# Patient Record
Sex: Male | Born: 1955 | Race: White | Hispanic: No | Marital: Married | Smoking: Former smoker
Health system: Southern US, Community
[De-identification: ages and names within clinical notes are randomized; demographics above are authoritative.]

## PROBLEM LIST (undated history)

## (undated) DIAGNOSIS — M329 Systemic lupus erythematosus, unspecified: Secondary | ICD-10-CM

## (undated) DIAGNOSIS — K219 Gastro-esophageal reflux disease without esophagitis: Secondary | ICD-10-CM

## (undated) DIAGNOSIS — I82409 Acute embolism and thrombosis of unspecified deep veins of unspecified lower extremity: Secondary | ICD-10-CM

## (undated) DIAGNOSIS — M1990 Unspecified osteoarthritis, unspecified site: Secondary | ICD-10-CM

## (undated) DIAGNOSIS — C4490 Unspecified malignant neoplasm of skin, unspecified: Secondary | ICD-10-CM

## (undated) DIAGNOSIS — R569 Unspecified convulsions: Secondary | ICD-10-CM

## (undated) DIAGNOSIS — E785 Hyperlipidemia, unspecified: Secondary | ICD-10-CM

## (undated) DIAGNOSIS — I10 Essential (primary) hypertension: Secondary | ICD-10-CM

## (undated) DIAGNOSIS — D689 Coagulation defect, unspecified: Secondary | ICD-10-CM

## (undated) HISTORY — DX: Unspecified osteoarthritis, unspecified site: M19.90

## (undated) HISTORY — DX: Unspecified malignant neoplasm of skin, unspecified: C44.90

## (undated) HISTORY — PX: EYE SURGERY: SHX253

## (undated) HISTORY — DX: Coagulation defect, unspecified: D68.9

## (undated) HISTORY — DX: Acute embolism and thrombosis of unspecified deep veins of unspecified lower extremity: I82.409

## (undated) HISTORY — PX: NO PAST SURGERIES: SHX2092

## (undated) HISTORY — DX: Essential (primary) hypertension: I10

## (undated) HISTORY — DX: Hyperlipidemia, unspecified: E78.5

## (undated) HISTORY — DX: Gastro-esophageal reflux disease without esophagitis: K21.9

## (undated) HISTORY — DX: Systemic lupus erythematosus, unspecified: M32.9

## (undated) HISTORY — DX: Unspecified convulsions: R56.9

---

## 2009-01-28 ENCOUNTER — Emergency Department: Payer: Self-pay | Admitting: Emergency Medicine

## 2009-05-16 ENCOUNTER — Ambulatory Visit: Payer: Self-pay | Admitting: Ophthalmology

## 2009-06-03 ENCOUNTER — Ambulatory Visit: Payer: Self-pay | Admitting: Ophthalmology

## 2010-02-26 LAB — HM COLONOSCOPY

## 2010-09-25 ENCOUNTER — Ambulatory Visit: Payer: Self-pay | Admitting: Nephrology

## 2011-01-30 IMAGING — CR DG CHEST 1V PORT
1 series · 1 of 1 positions shown · non-contrast
Comparison: none

REASON FOR EXAM: Chest Pain
COMMENTS:

[view not recorded]
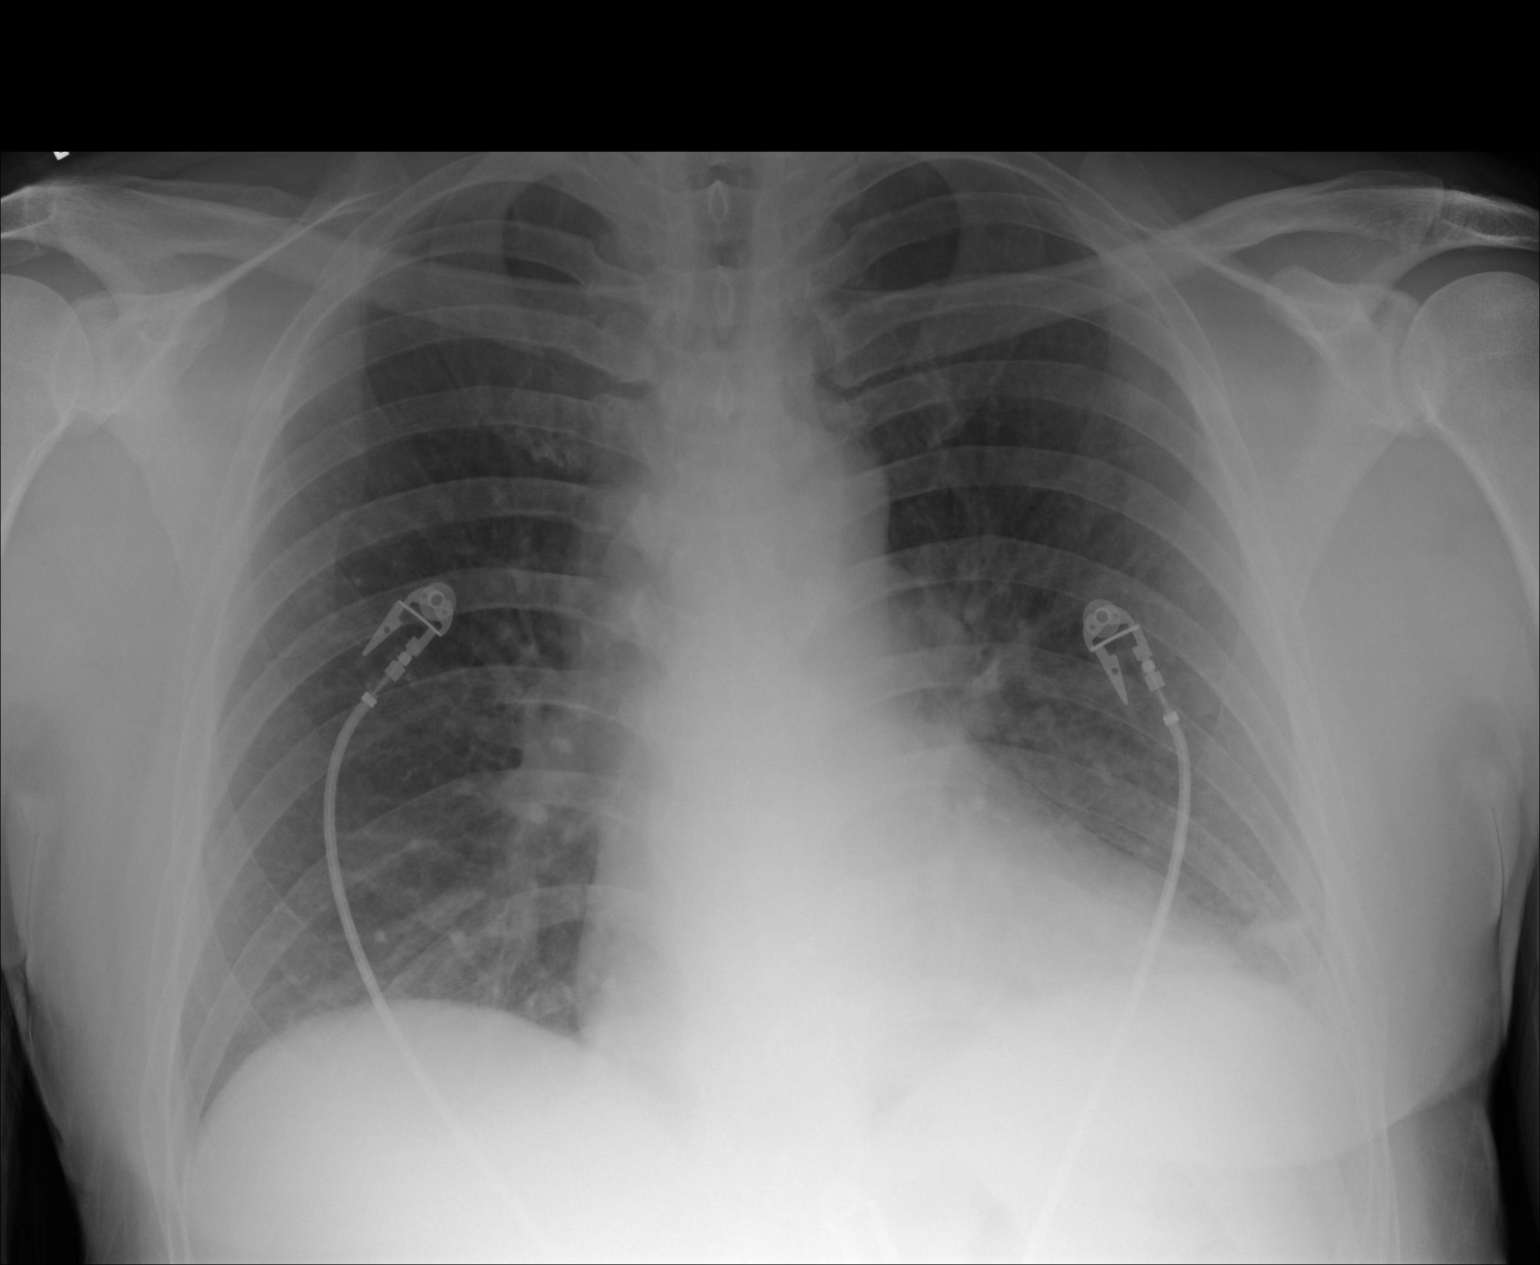

[1 of 1 positions shown; findings below may reference images not displayed]

PROCEDURE:     DXR - DXR PORTABLE CHEST SINGLE VIEW  - January 29, 2009 [DATE]

RESULT:     There is increased density at the left lung base which may
represent atelectasis or infiltrate. The heart is at the upper limits of
normal in size. There is no edema or pneumothorax. There is blunting of the
left costophrenic angle suggestive of effusion.
IMPRESSION: Probable left lung base pneumonia. Follow-up PA and lateral
views are recommended.

## 2011-01-30 IMAGING — CT CT CHEST W/ CM
1 series · 15 of 33 positions shown, 19 images · IV contrast (APPLIED)
Comparison: none

REASON FOR EXAM: syncope, shortness of breath, hx of DVT (remote), Eval
for PE
COMMENTS:

PROCEDURE:     CT  - CT CHEST (FOR PE) W  - January 29, 2009  [DATE]
RESULT:
TECHNIQUE: Helical 3 mm sections were obtained from the thoracic inlet
through the lung bases status post intravenous administration of 75 ml
[id number].

[Series 4: soft tissue · axial · 0.68mm/px · z∈[-738,-447]mm · 15 of 115 slices shown, 19 images]
[im 9/115  mediastinal]
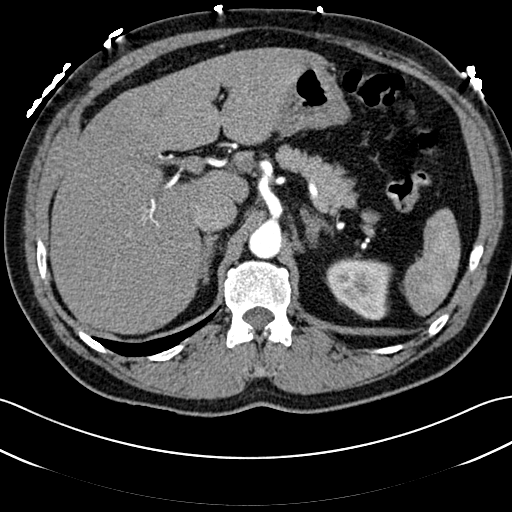
[im 9/115  lung]
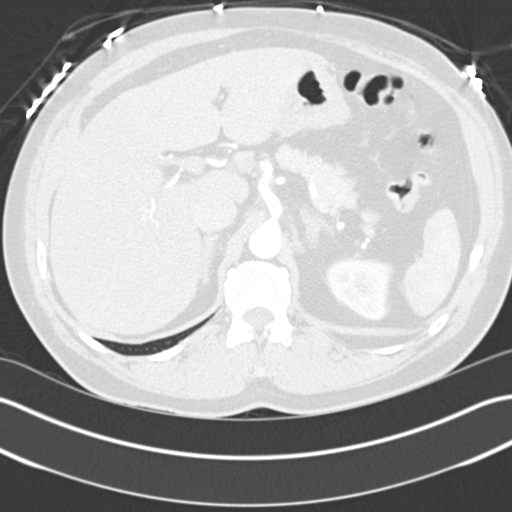
[im 17/115  lung]
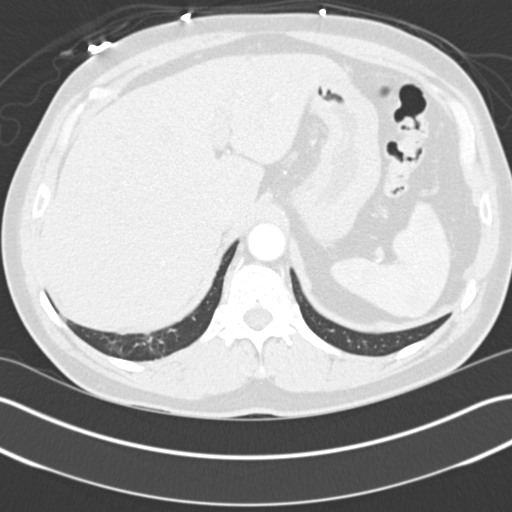
[im 23/115  lung]
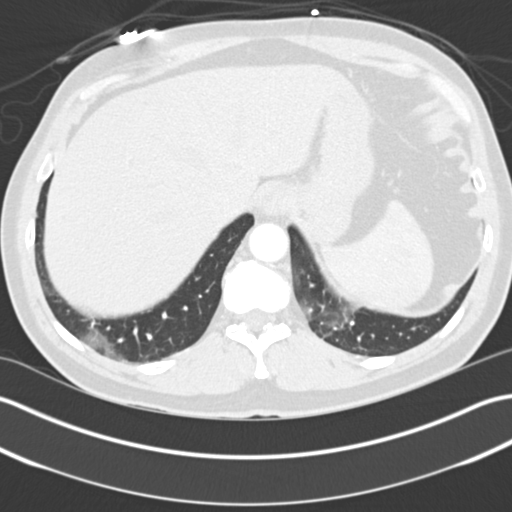
[im 30/115  lung]
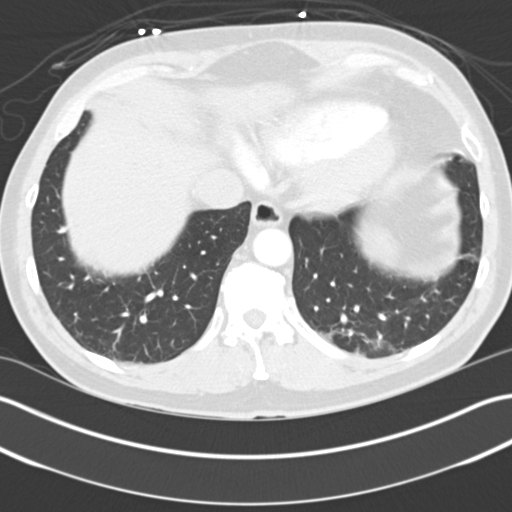
[im 39/115  mediastinal]
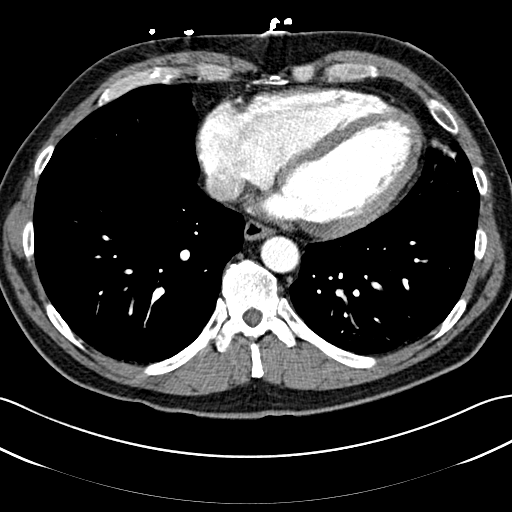
[im 39/115  lung]
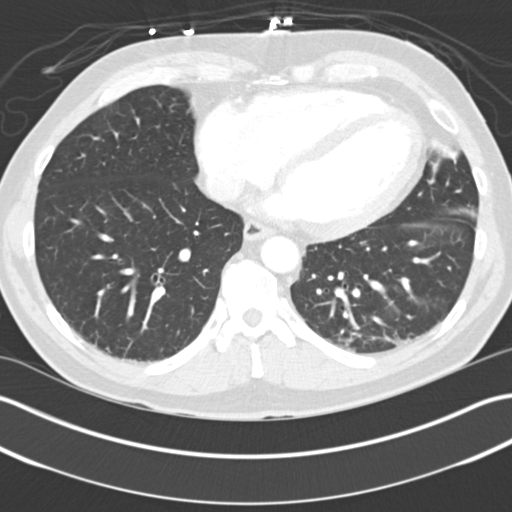
[im 46/115  lung]
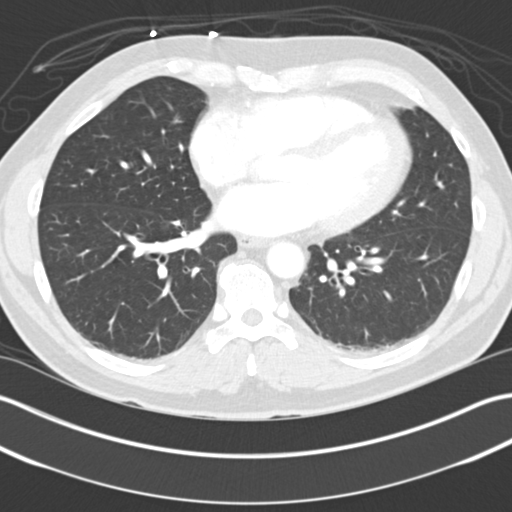
[im 51/115  lung]
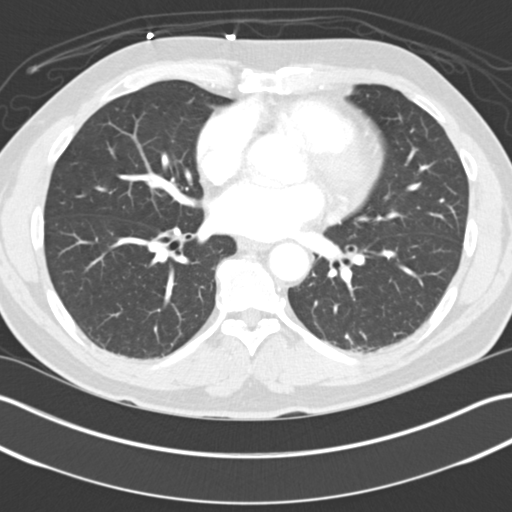
[im 60/115  lung]
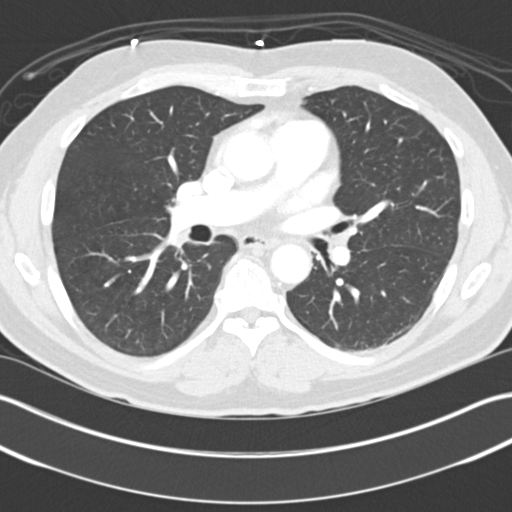
[im 64/115  mediastinal]
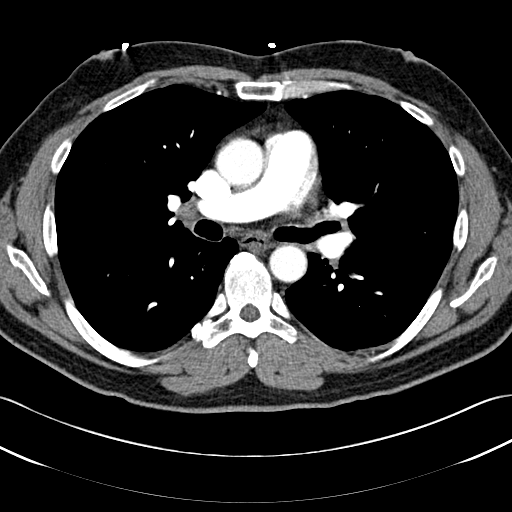
[im 64/115  lung]
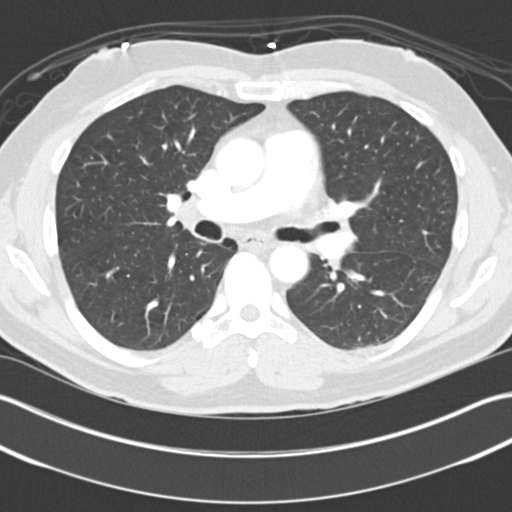
[im 69/115  lung]
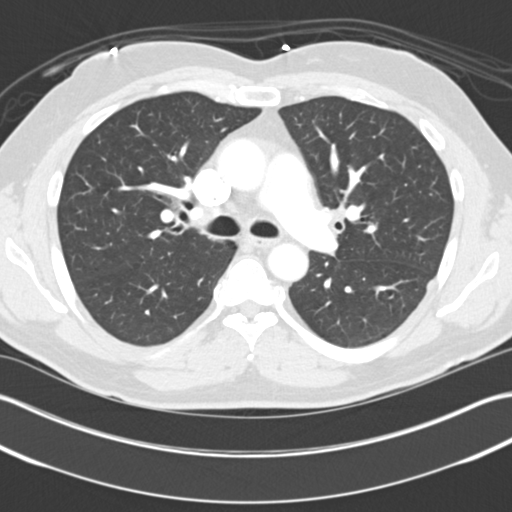
[im 77/115  lung]
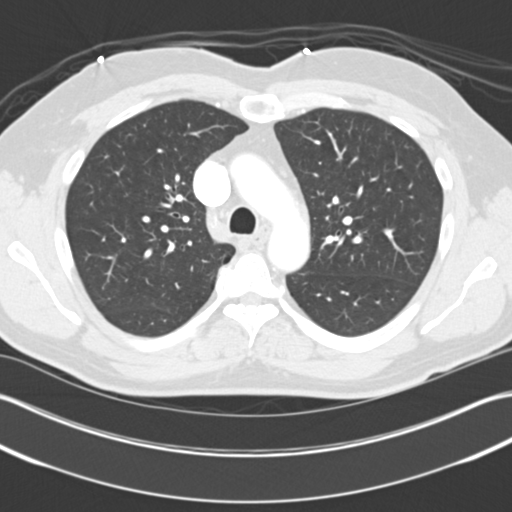
[im 85/115  lung]
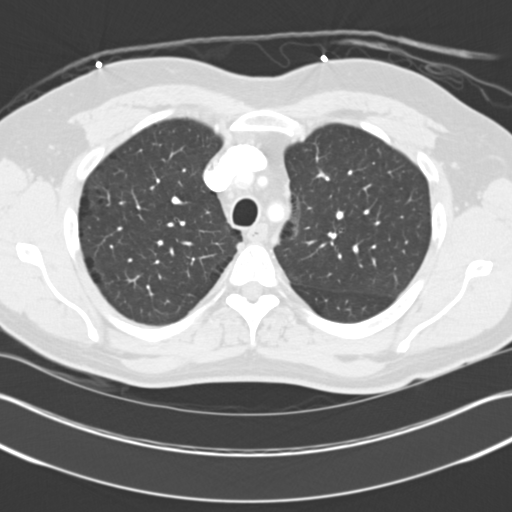
[im 92/115  mediastinal]
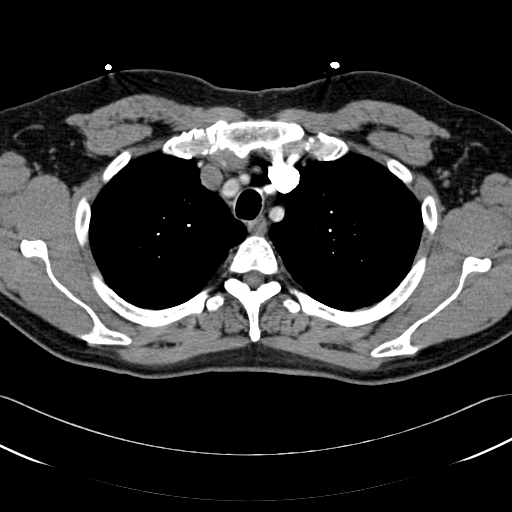
[im 92/115  lung]
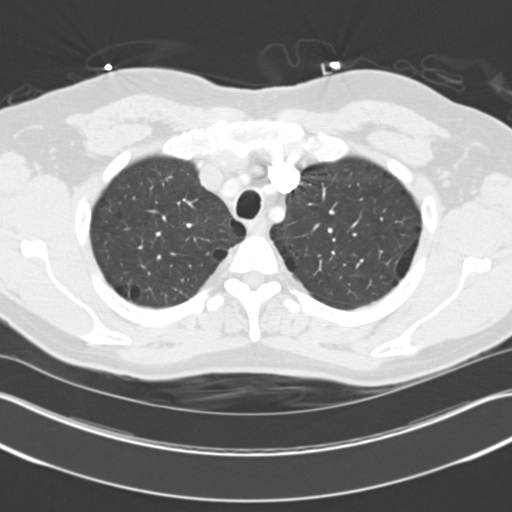
[im 98/115  lung]
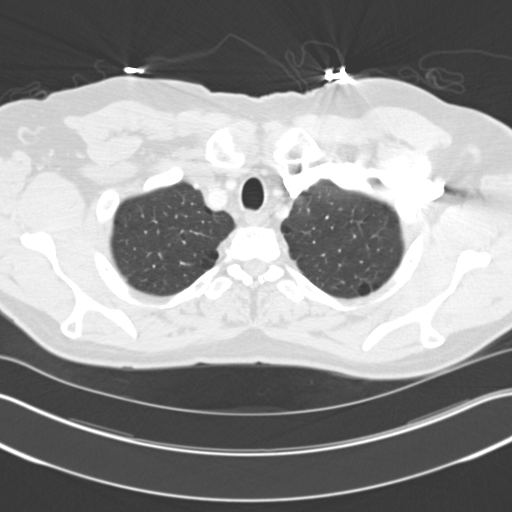
[im 106/115  lung]
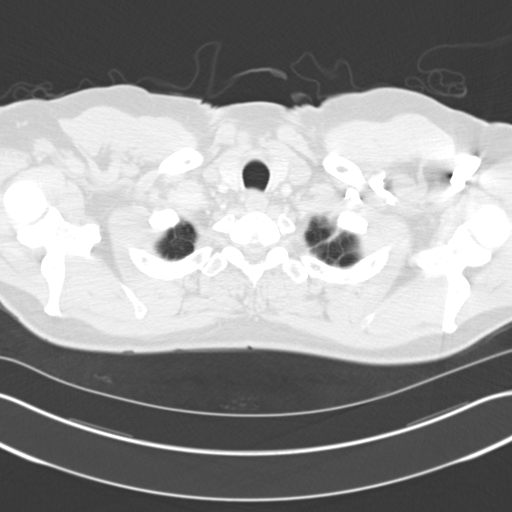

[15 of 33 positions shown; findings below may reference images not displayed]

FINDINGS: Evaluation of the mediastinum and hilar regions and structures
demonstrates no evidence of mediastinal or hilar adenopathy nor masses.
There is no evidence of filling defects within the main lobar or segmental
pulmonary arteries. The lung parenchyma demonstrates no evidence of focal
infiltrates, effusions, edema, masses or nodules. Hypoventilation is
identified within the lung bases as well as minimal emphysematous changes
within the apices. There is no CT evidence of a thoracic aortic aneurysm nor
dissection. The visualized upper abdominal viscera are grossly unremarkable.
IMPRESSION: 1. No CT evidence of focal or acute intrathoracic abnormalities.
2. There is no CT evidence of pulmonary arterial embolic disease, aortic
dissection or aneurysm.

Dr. Alexis Antonio of the Emergency Department was informed of these findings via
a preliminary faxed report on 01/29/2009 at [DATE] a.m. Central Time.

## 2012-07-09 ENCOUNTER — Emergency Department: Payer: Self-pay | Admitting: Emergency Medicine

## 2012-09-25 IMAGING — CR DG ABDOMEN 1V
1 series · 2 of 2 positions shown · non-contrast
Comparison: none

REASON FOR EXAM: flank pain
COMMENTS:

[Series 1: view not recorded · 0.17mm/px · 2 of 2 slices shown]
[im 1/2]
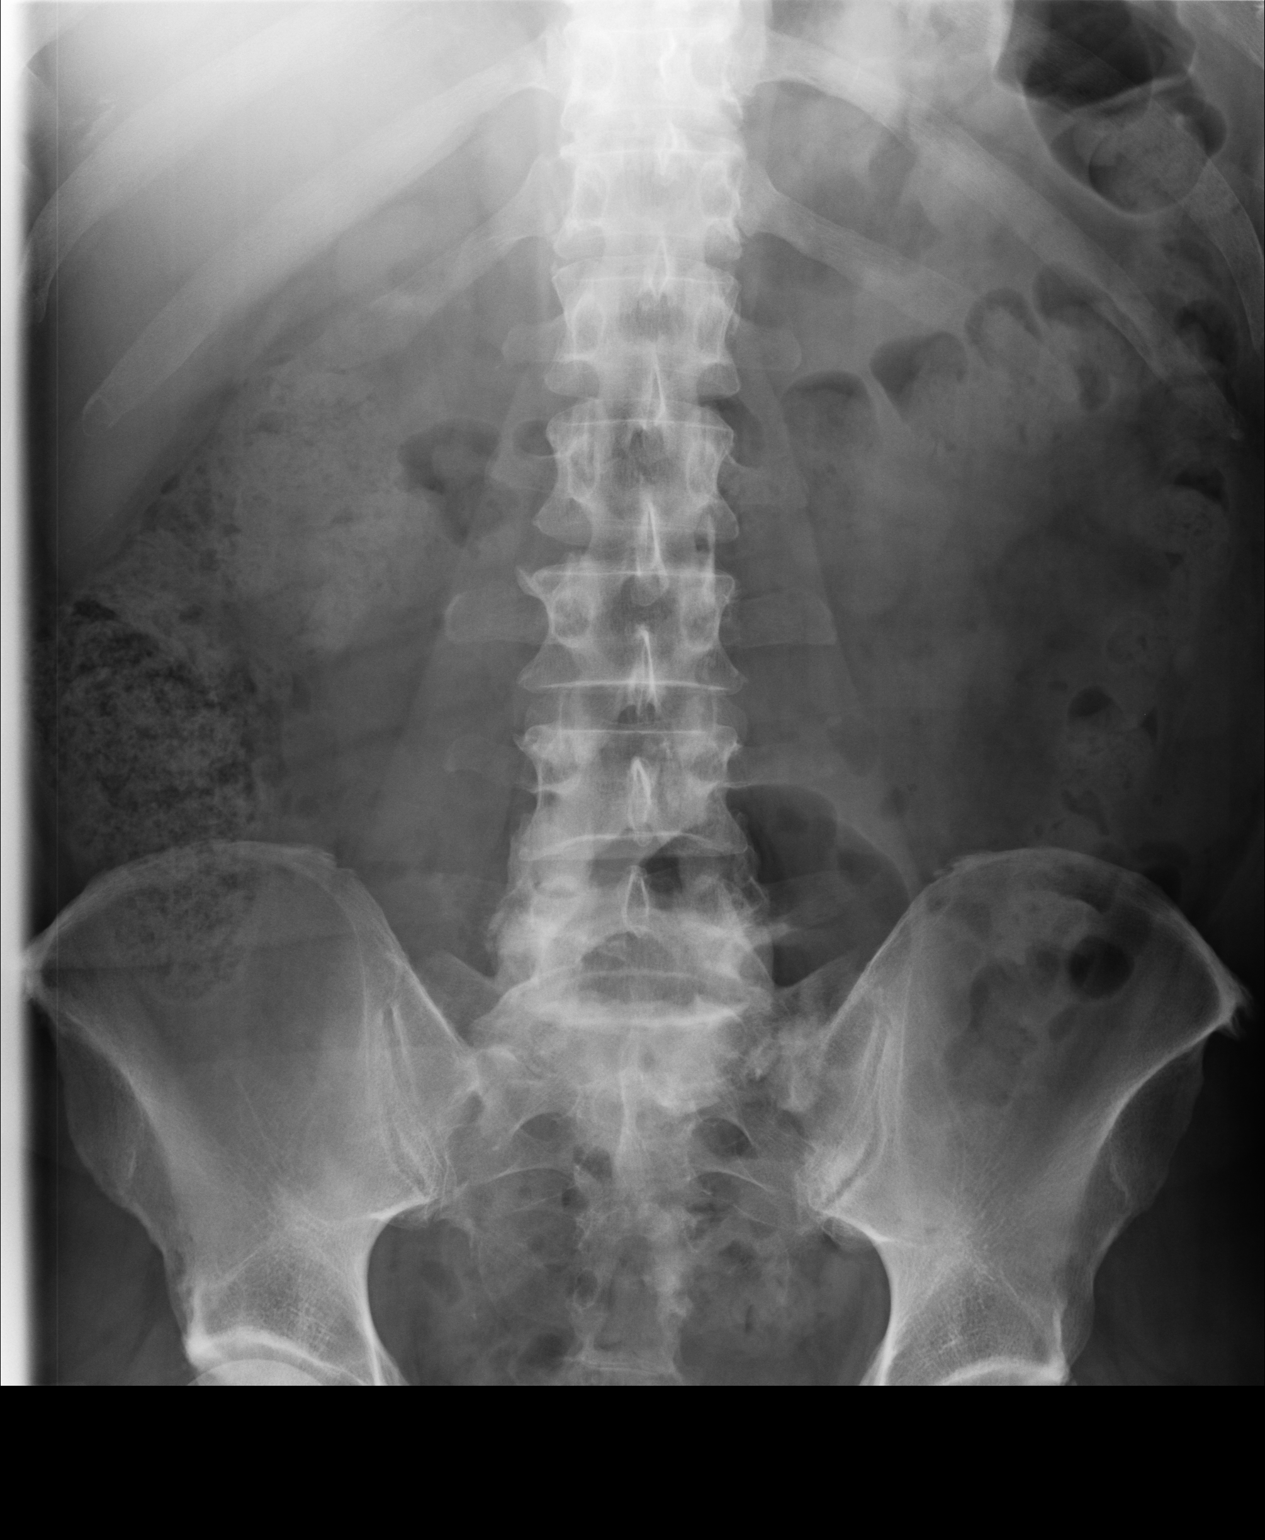
[im 2/2]
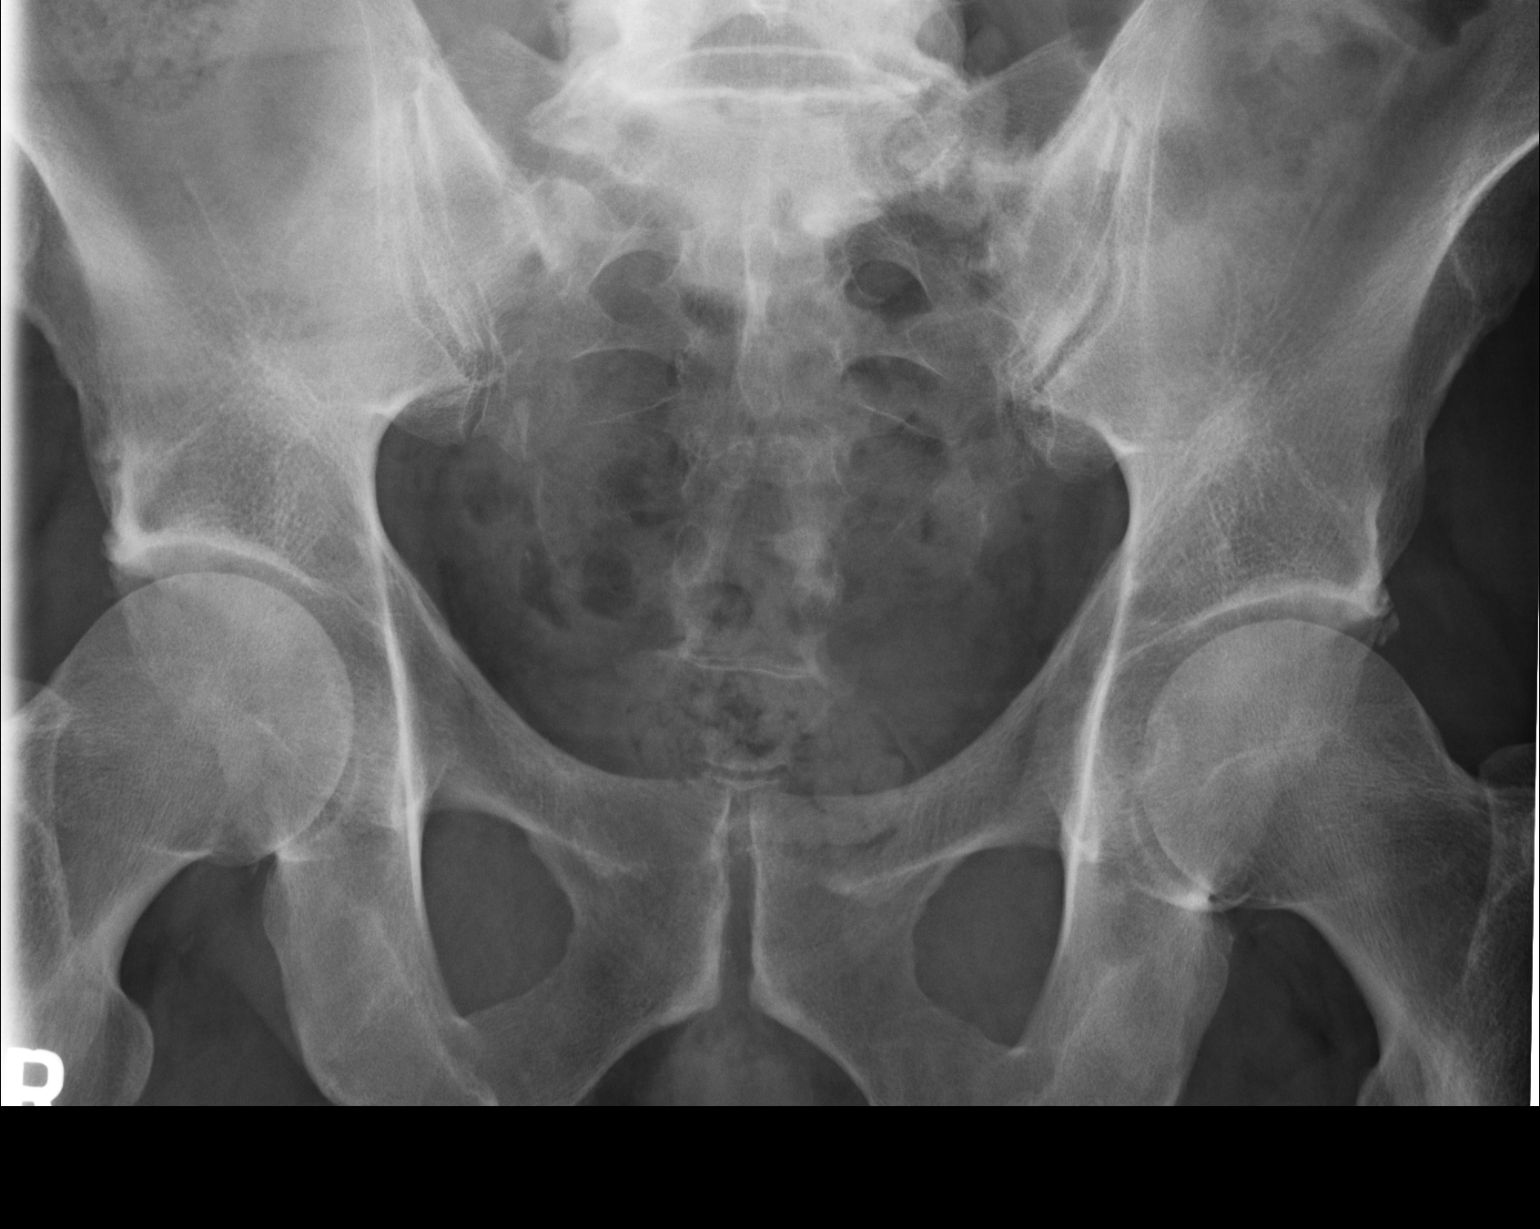

[2 of 2 positions shown; findings below may reference images not displayed]

PROCEDURE:     DXR - DXR KIDNEY URETER BLADDER  - September 25, 2010  [DATE]

RESULT:     An AP view of the abdomen shows a moderately large amount of
fecal material in the colon. No dilated bowel loops suspicious for bowel
obstruction are seen. No abnormal intra-abdominal calcifications are
identified. The psoas margins are bilaterally sharp. No acute bony
abnormalities are seen.
IMPRESSION: 1.  No bowel obstruction or other acute change is identified.
2.  There is a moderately large amount of fecal material in the colon.

## 2012-09-29 LAB — PSA

## 2014-01-18 DIAGNOSIS — M329 Systemic lupus erythematosus, unspecified: Secondary | ICD-10-CM | POA: Insufficient documentation

## 2014-08-02 ENCOUNTER — Encounter: Payer: Self-pay | Admitting: Family Medicine

## 2014-08-02 ENCOUNTER — Other Ambulatory Visit (INDEPENDENT_AMBULATORY_CARE_PROVIDER_SITE_OTHER): Payer: No Typology Code available for payment source | Admitting: Family Medicine

## 2014-08-02 ENCOUNTER — Telehealth: Payer: Self-pay | Admitting: Unknown Physician Specialty

## 2014-08-02 VITALS — BP 154/70 | HR 70 | Ht 71.1 in | Wt 199.0 lb

## 2014-08-02 DIAGNOSIS — E785 Hyperlipidemia, unspecified: Secondary | ICD-10-CM | POA: Insufficient documentation

## 2014-08-02 DIAGNOSIS — I82409 Acute embolism and thrombosis of unspecified deep veins of unspecified lower extremity: Secondary | ICD-10-CM | POA: Insufficient documentation

## 2014-08-02 DIAGNOSIS — I82402 Acute embolism and thrombosis of unspecified deep veins of left lower extremity: Secondary | ICD-10-CM | POA: Diagnosis not present

## 2014-08-02 DIAGNOSIS — I10 Essential (primary) hypertension: Secondary | ICD-10-CM | POA: Insufficient documentation

## 2014-08-02 DIAGNOSIS — Z86718 Personal history of other venous thrombosis and embolism: Secondary | ICD-10-CM | POA: Insufficient documentation

## 2014-08-02 LAB — COAGUCHEK XS/INR WAIVED
INR: 2.4 — ABNORMAL HIGH (ref 0.9–1.1)
PROTHROMBIN TIME: 29.3 s

## 2014-08-02 NOTE — Telephone Encounter (Signed)
Pt called would like to come in today to have his coumadin levels checked. Can this order be added today? If so please call pt's wife to schedule a lab visit.  [place] (Pt's wife)  Thanks.

## 2014-08-02 NOTE — Progress Notes (Signed)
BP 168/92 mmHg  Pulse 70  Ht 5' 11.1" (1.806 m)  Wt 199 lb (90.266 kg)  BMI 27.68 kg/m2  SpO2 99% repeat BP 154/70  Subjective:    Patient ID: Dwayne Jacobson, male    DOB: 01/19/1956, 59 y.o.   MRN: [id number]  CC: Coumadin management  HPI: This patient is a 60 y.o. male who presents for coumadin management. 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  Present Coumadin dose: 2 T, Th, 1mg  5 days a week Goal: 2.0-3.0  Excessive bruising: no Nose bleeding: no Rectal bleeding: no Prolonged menstrual cycles: N/A Eating diet with consistent amounts of foods containing Vitamin K:no Any recent antibiotic use? no  ROS: Per HPI unless specifically indicated above     Objective:    BP 168/92 mmHg  Pulse 70  Ht 5' 11.1" (1.806 m)  Wt 199 lb (90.266 kg)  BMI 27.68 kg/m2  SpO2 99% Repeat BP 154/70 Wt Readings from Last 3 Encounters:  08/02/14 199 lb (90.266 kg)     General: Well appearing, well nourished in no distress.  Normal mood and affect. Skin: No excessive bruising or rash  Last INR: 2.4 PT: 29.3    No results found for this or any previous visit.     Assessment:     ICD-9-CM ICD-10-CM   1. Deep venous thrombosis of leg, left 453.40 I82.402 CoaguChek XS/INR [name]    Plan:   Discussed current plan face-to-face with patient. For coumadin dosing, elected to continue current dose. Will plan to recheck INR in 1 month. HTN- elevated today. Better on recheck. Return for recheck on BP in 1 month. Information on DASH diet given to patient today.

## 2014-08-02 NOTE — Telephone Encounter (Signed)
Dwayne Jacobson stated that he can not come in just to have it checked and leave because we don't do lab visits. So he would have to have an appointment.

## 2014-08-02 NOTE — Patient Instructions (Signed)
DASH Eating Plan °DASH stands for "Dietary Approaches to Stop Hypertension." The DASH eating plan is a healthy eating plan that has been shown to reduce high blood pressure (hypertension). Additional health benefits may include reducing the risk of type 2 diabetes mellitus, heart disease, and stroke. The DASH eating plan may also help with weight loss. °WHAT DO I NEED TO KNOW ABOUT THE DASH EATING PLAN? °For the DASH eating plan, you will follow these general guidelines: °· Choose foods with a percent daily value for sodium of less than 5% (as listed on the food label). °· Use salt-free seasonings or herbs instead of table salt or sea salt. °· Check with your health care provider or pharmacist before using salt substitutes. °· Eat lower-sodium products, often labeled as "lower sodium" or "no salt added." °· Eat fresh foods. °· Eat more vegetables, fruits, and low-fat dairy products. °· Choose whole grains. Look for the word "whole" as the first word in the ingredient list. °· Choose fish and skinless chicken or turkey more often than red meat. Limit fish, poultry, and meat to 6 oz (170 g) each day. °· Limit sweets, desserts, sugars, and sugary drinks. °· Choose heart-healthy fats. °· Limit cheese to 1 oz (28 g) per day. °· Eat more home-cooked food and less restaurant, buffet, and fast food. °· Limit fried foods. °· Cook foods using methods other than frying. °· Limit canned vegetables. If you do use them, rinse them well to decrease the sodium. °· When eating at a restaurant, ask that your food be prepared with less salt, or no salt if possible. °WHAT FOODS CAN I EAT? °Seek help from a dietitian for individual calorie needs. °Grains °Whole grain or whole wheat bread. Brown rice. Whole grain or whole wheat pasta. Quinoa, bulgur, and whole grain cereals. Low-sodium cereals. Corn or whole wheat flour tortillas. Whole grain cornbread. Whole grain crackers. Low-sodium crackers. °Vegetables °Fresh or frozen vegetables  (raw, steamed, roasted, or grilled). Low-sodium or reduced-sodium tomato and vegetable juices. Low-sodium or reduced-sodium tomato sauce and paste. Low-sodium or reduced-sodium canned vegetables.  °Fruits °All fresh, canned (in natural juice), or frozen fruits. °Meat and Other Protein Products °Ground beef (85% or leaner), grass-fed beef, or beef trimmed of fat. Skinless chicken or turkey. Ground chicken or turkey. Pork trimmed of fat. All fish and seafood. Eggs. Dried beans, peas, or lentils. Unsalted nuts and seeds. Unsalted canned beans. °Dairy °Low-fat dairy products, such as skim or 1% milk, 2% or reduced-fat cheeses, low-fat ricotta or cottage cheese, or plain low-fat yogurt. Low-sodium or reduced-sodium cheeses. °Fats and Oils °Tub margarines without trans fats. Light or reduced-fat mayonnaise and salad dressings (reduced sodium). Avocado. Safflower, olive, or canola oils. Natural peanut or almond butter. °Other °Unsalted popcorn and pretzels. °The items listed above may not be a complete list of recommended foods or beverages. Contact your dietitian for more options. °WHAT FOODS ARE NOT RECOMMENDED? °Grains °White bread. White pasta. White rice. Refined cornbread. Bagels and croissants. Crackers that contain trans fat. °Vegetables °Creamed or fried vegetables. Vegetables in a cheese sauce. Regular canned vegetables. Regular canned tomato sauce and paste. Regular tomato and vegetable juices. °Fruits °Dried fruits. Canned fruit in light or heavy syrup. Fruit juice. °Meat and Other Protein Products °Fatty cuts of meat. Ribs, chicken wings, bacon, sausage, bologna, salami, chitterlings, fatback, hot dogs, bratwurst, and packaged luncheon meats. Salted nuts and seeds. Canned beans with salt. °Dairy °Whole or 2% milk, cream, half-and-half, and cream cheese. Whole-fat or sweetened yogurt. Full-fat   cheeses or blue cheese. Nondairy creamers and whipped toppings. Processed cheese, cheese spreads, or cheese  curds. °Condiments °Onion and garlic salt, seasoned salt, table salt, and sea salt. Canned and packaged gravies. Worcestershire sauce. Tartar sauce. Barbecue sauce. Teriyaki sauce. Soy sauce, including reduced sodium. Steak sauce. Fish sauce. Oyster sauce. Cocktail sauce. Horseradish. Ketchup and mustard. Meat flavorings and tenderizers. Bouillon cubes. Hot sauce. Tabasco sauce. Marinades. Taco seasonings. Relishes. °Fats and Oils °Butter, stick margarine, lard, shortening, ghee, and bacon fat. Coconut, palm kernel, or palm oils. Regular salad dressings. °Other °Pickles and olives. Salted popcorn and pretzels. °The items listed above may not be a complete list of foods and beverages to avoid. Contact your dietitian for more information. °WHERE CAN I FIND MORE INFORMATION? °National Heart, [employer], [employer] Institute: www.nhlbi.nih.gov/health/health-topics/topics/dash/ °Document Released: 01/07/2011 Document Revised: 06/04/2013 Document Reviewed: 11/22/2012 °Exit[employer]® Patient Information ©2015 [employer]. This information is not intended to replace advice given to you by your health care provider. Make sure you discuss any questions you have with your health care provider. ° °

## 2014-08-02 NOTE — Telephone Encounter (Signed)
Can he be added to her schedule for this afternoon?

## 2014-09-06 ENCOUNTER — Other Ambulatory Visit: Payer: No Typology Code available for payment source

## 2014-09-06 ENCOUNTER — Ambulatory Visit: Payer: No Typology Code available for payment source | Admitting: Unknown Physician Specialty

## 2014-09-13 ENCOUNTER — Other Ambulatory Visit: Payer: No Typology Code available for payment source

## 2014-09-13 ENCOUNTER — Ambulatory Visit (INDEPENDENT_AMBULATORY_CARE_PROVIDER_SITE_OTHER): Payer: No Typology Code available for payment source | Admitting: Unknown Physician Specialty

## 2014-09-13 ENCOUNTER — Encounter: Payer: Self-pay | Admitting: Unknown Physician Specialty

## 2014-09-13 VITALS — BP 151/81 | HR 67 | Temp 98.5°F | Ht 71.4 in | Wt 200.4 lb

## 2014-09-13 DIAGNOSIS — I82409 Acute embolism and thrombosis of unspecified deep veins of unspecified lower extremity: Secondary | ICD-10-CM | POA: Diagnosis not present

## 2014-09-13 DIAGNOSIS — D689 Coagulation defect, unspecified: Secondary | ICD-10-CM | POA: Diagnosis not present

## 2014-09-13 LAB — COAGUCHEK XS/INR WAIVED
INR: 2.6 — ABNORMAL HIGH (ref 0.9–1.1)
Prothrombin Time: 30.9 s

## 2014-09-13 NOTE — Progress Notes (Signed)
BP 151/81 mmHg  Pulse 67  Temp(Src) 98.5 F (36.9 C)  Ht 5' 11.4" (1.814 m)  Wt 200 lb 6.4 oz (90.901 kg)  BMI 27.62 kg/m2  SpO2 97%   Subjective:    Patient ID: Dwayne Jacobson, male    DOB: [birth year], 59 y.o.   MRN: [id number]  HPI: Dwayne Jacobson is a 59 y.o. male  Chief Complaint  Patient presents with  . Coagulation Disorder    Relevant past medical, surgical, family and social history reviewed and updated as indicated. Interim medical history since our last visit reviewed. Allergies and medications reviewed and updated.  Subjective:  This patient is a 59 year old male who presents for coumadin management. The expected duration of coumadin treatment is . H4   The reason for anticoagulation is  history of DVT/PE. H6   Present Coumadin dose:2mg  x 2days / 1mg  x 5days Goal: 2.0-3.0   Excessive bruising:   no H8  Nose bleeding:   no H8  Rectal bleeding:   no H8  Prolonged menstrual cycles:   H8  Eating diet with consistent amounts of foods containing Vitamin K:  H7  Any recent antibiotic use?   no H7   Objective:   General:  Well appearing, well nourished in no distress.  Normal mood and affect.  O1 Skin:  No excessive bruising or rash  O9  Current INR:  PT: 30.9 INR: 2.6  Last CBC: HGB: 15.0 on 09/29/2012   HCT: 44.3 on 09/29/2012       Review of Systems  Per HPI unless specifically indicated above     Objective:    BP 151/81 mmHg  Pulse 67  Temp(Src) 98.5 F (36.9 C)  Ht 5' 11.4" (1.814 m)  Wt 200 lb 6.4 oz (90.901 kg)  BMI 27.62 kg/m2  SpO2 97%  Wt Readings from Last 3 Encounters:  09/13/14 200 lb 6.4 oz (90.901 kg)  05/17/14 198 lb (89.812 kg)  08/02/14 199 lb (90.266 kg)    Physical Exam  Constitutional: He is oriented to person, place, and time. He appears well-developed and well-nourished. No distress.  HENT:  Head: Normocephalic and atraumatic.  Eyes: Conjunctivae and lids are normal. Right eye exhibits no discharge. Left eye  exhibits no discharge. No scleral icterus.  Cardiovascular: Normal rate and regular rhythm.   Pulmonary/Chest: Effort normal. No respiratory distress.  Abdominal: Normal appearance and bowel sounds are normal. He exhibits no distension. There is no splenomegaly or hepatomegaly. There is no tenderness.  Musculoskeletal: Normal range of motion.  Neurological: He is alert and oriented to person, place, and time.  Skin: Skin is intact. No rash noted. No pallor.  Psychiatric: He has a normal mood and affect. His behavior is normal. Judgment and thought content normal.    Current INR:  PT: 30.9 INR: 2.6      Assessment & Plan:   Problem List Items Addressed This Visit      Unprioritized   DVT (deep venous thrombosis)    Discussed current plan face-to-face with patient. For coumadin dosing, elected to  Continue present dose   . Will plan to recheck INR in 1 month  plus needs regular visit        Other Visit Diagnoses    Coagulation disorder    -  Primary    Relevant Orders    CoaguChek XS/INR [name]        Follow up plan: Noted high BP.  Regular office visit, not  just INR next month.  Do not draw INR before I see him.   Return in about 4 weeks (around 10/11/2014).

## 2014-09-13 NOTE — Assessment & Plan Note (Signed)
Discussed current plan face-to-face with patient. For coumadin dosing, elected to  Continue present dose   . Will plan to recheck INR in 1 month  plus needs regular visit

## 2014-10-11 ENCOUNTER — Ambulatory Visit: Payer: No Typology Code available for payment source | Admitting: Unknown Physician Specialty

## 2014-11-01 ENCOUNTER — Encounter: Payer: Self-pay | Admitting: Unknown Physician Specialty

## 2014-11-01 ENCOUNTER — Ambulatory Visit (INDEPENDENT_AMBULATORY_CARE_PROVIDER_SITE_OTHER): Payer: No Typology Code available for payment source | Admitting: Unknown Physician Specialty

## 2014-11-01 VITALS — BP 157/85 | HR 69 | Temp 98.7°F | Ht 71.2 in | Wt 196.8 lb

## 2014-11-01 DIAGNOSIS — I82409 Acute embolism and thrombosis of unspecified deep veins of unspecified lower extremity: Secondary | ICD-10-CM

## 2014-11-01 DIAGNOSIS — E785 Hyperlipidemia, unspecified: Secondary | ICD-10-CM | POA: Diagnosis not present

## 2014-11-01 DIAGNOSIS — I10 Essential (primary) hypertension: Secondary | ICD-10-CM | POA: Diagnosis not present

## 2014-11-01 LAB — LIPID PANEL PICCOLO, WAIVED
CHOL/HDL RATIO PICCOLO,WAIVE: 2.9 mg/dL
CHOLESTEROL PICCOLO, WAIVED: 145 mg/dL (ref <200)
HDL CHOL PICCOLO, WAIVED: 51 mg/dL — AB (ref >59)
LDL Chol Calc Piccolo Waived: 73 mg/dL (ref <100)
Triglycerides Piccolo,Waived: 104 mg/dL (ref <150)
VLDL Chol Calc Piccolo,Waive: 21 mg/dL (ref <30)

## 2014-11-01 LAB — COAGUCHEK XS/INR WAIVED
INR: 3.6 — ABNORMAL HIGH (ref 0.9–1.1)
PROTHROMBIN TIME: 43.3 s

## 2014-11-01 LAB — MICROALBUMIN, URINE WAIVED
Creatinine, Urine Waived: 50 mg/dL (ref 10–300)
Microalb, Ur Waived: 10 mg/L (ref 0–19)

## 2014-11-01 MED ORDER — ATORVASTATIN CALCIUM 10 MG PO TABS: 10.0000 mg | ORAL_TABLET | Freq: Every day | ORAL | Status: DC

## 2014-11-01 MED ORDER — WARFARIN SODIUM 2 MG PO TABS: ORAL_TABLET | ORAL | Status: DC

## 2014-11-01 MED ORDER — LISINOPRIL 10 MG PO TABS: 10.0000 mg | ORAL_TABLET | Freq: Every day | ORAL | Status: DC

## 2014-11-01 MED ORDER — HYDROCHLOROTHIAZIDE 25 MG PO TABS: 25.0000 mg | ORAL_TABLET | Freq: Every day | ORAL | Status: DC

## 2014-11-01 MED ORDER — AMLODIPINE BESYLATE 5 MG PO TABS: 5.0000 mg | ORAL_TABLET | Freq: Every day | ORAL | Status: DC

## 2014-11-01 NOTE — Assessment & Plan Note (Signed)
Reviewed Lipid panel

## 2014-11-01 NOTE — Progress Notes (Signed)
BP 157/85 mmHg  Pulse 69  Temp(Src) 98.7 F (37.1 C)  Ht 5' 11.2" (1.808 m)  Wt 196 lb 12.8 oz (89.268 kg)  BMI 27.31 kg/m2  SpO2 97%   Subjective:    Patient ID: Dwayne Jacobson, male    DOB: 10/29/1955, 59 y.o.   MRN: [id number]  HPI: Dwayne Jacobson is a 59 y.o. male  Chief Complaint  Patient presents with  . Hyperlipidemia  . Hypertension   Hypertension/Hyperlipidemia: Current regimen is amlodipine 5mg  and HCTZ 25mg  daily. He denies missing any doses.Blood pressure has steadily crept up over the last six months. He does not monitor BP at home. Denies headaches, chest pain, shortness of breath or lightheadedness.   Anticoagulation: Currently taking warfarin 2mg  - two days week and 1mg  five days. Goal is 2.0-3.0/ Today he is at 3.6. He has not been consistent with vitamin K in his diet. He denies bruising or bleeding easily.   Relevant past medical, surgical, family and social history reviewed and updated as indicated. Interim medical history since our last visit reviewed. Allergies and medications reviewed and updated.  Review of Systems  Constitutional: Positive for fatigue. Negative for fever, chills, activity change and appetite change.       Complains of constant fatigue for "awhile."  Denies snoring.    HENT: Negative.  Negative for congestion, sinus pressure, sneezing and sore throat.   Eyes: Negative.  Negative for discharge and redness.  Respiratory: Negative.  Negative for cough, chest tightness, shortness of breath, wheezing and stridor.   Cardiovascular: Negative.  Negative for chest pain, palpitations and leg swelling.  Gastrointestinal: Negative.  Negative for nausea, diarrhea and constipation.  Musculoskeletal: Negative.  Negative for myalgias, back pain, arthralgias and gait problem.  Skin: Negative.  Negative for color change, pallor, rash and wound.  Neurological: Negative.  Negative for dizziness, light-headedness and headaches.  Hematological: Negative for  adenopathy. Does not bruise/bleed easily.  Psychiatric/Behavioral: Negative.  Negative for confusion, sleep disturbance and self-injury. The patient is not nervous/anxious.     Per HPI unless specifically indicated above     Objective:    BP 157/85 mmHg  Pulse 69  Temp(Src) 98.7 F (37.1 C)  Ht 5' 11.2" (1.808 m)  Wt 196 lb 12.8 oz (89.268 kg)  BMI 27.31 kg/m2  SpO2 97%  Wt Readings from Last 3 Encounters:  11/01/14 196 lb 12.8 oz (89.268 kg)  09/13/14 200 lb 6.4 oz (90.901 kg)  05/17/14 198 lb (89.812 kg)    Physical Exam  Constitutional: He is oriented to person, place, and time. He appears well-developed and well-nourished. No distress.  HENT:  Head: Normocephalic and atraumatic.  Eyes: Conjunctivae are normal.  Neck: Normal range of motion. Neck supple.  Cardiovascular: Normal rate, regular rhythm and normal heart sounds.   Pulmonary/Chest: Effort normal and breath sounds normal. No respiratory distress. He has no wheezes. He has no rales. He exhibits no tenderness.  Musculoskeletal: Normal range of motion. He exhibits no edema or tenderness.  Neurological: He is alert and oriented to person, place, and time.  Skin: Skin is warm and dry. No rash noted. He is not diaphoretic. No erythema. No pallor.  Psychiatric: He has a normal mood and affect. His behavior is normal. Judgment and thought content normal.    Results for orders placed or performed in visit on 11/01/14  Lipid Panel [name], [employer]  Result Value Ref Range   Cholesterol [name], [name] 145 <200 mg/dL   HDL Chol  [name], [name] 51 (L) >59 mg/dL   Triglycerides [name],[name] 104 <150 mg/dL   Chol/HDL Ratio [name],Waive 2.9 mg/dL   LDL Chol Calc [name] [name] 73 <100 mg/dL   VLDL Chol Calc [name],Waive 21 <30 mg/dL  Microalbumin, Urine [name]  Result Value Ref Range   Microalb, Ur [name] 10 0 - 19 mg/L   Creatinine, Urine [name] 50 10 - 300 mg/dL   Microalb/Creat Ratio 30-300 (H) <30 mg/g  CoaguChek  XS/INR [name]  Result Value Ref Range   INR 3.6 (H) 0.9 - 1.1   Prothrombin Time 43.3 sec      Assessment & Plan:   Problem List Items Addressed This Visit      Unprioritized   DVT (deep venous thrombosis)    INR is 3.6. Will continue with warfarin 2mg  two times per week and 1mg  five times per week and recheck in two weeks.      Relevant Medications   hydrochlorothiazide (HYDRODIURIL) 25 MG tablet   lisinopril (PRINIVIL,ZESTRIL) 10 MG tablet   amLODipine (NORVASC) 5 MG tablet   atorvastatin (LIPITOR) 10 MG tablet   warfarin (COUMADIN) 2 MG tablet   Other Relevant Orders   CoaguChek XS/INR [name] (Completed)   Hypertension - Primary    Blood pressure has been elevated for several months. Add lisinopril 10mg  to current regimen. Will recheck in two weeks.      Relevant Medications   hydrochlorothiazide (HYDRODIURIL) 25 MG tablet   lisinopril (PRINIVIL,ZESTRIL) 10 MG tablet   amLODipine (NORVASC) 5 MG tablet   atorvastatin (LIPITOR) 10 MG tablet   warfarin (COUMADIN) 2 MG tablet   Other Relevant Orders   Comprehensive metabolic panel   Uric acid   Microalbumin, Urine [name] (Completed)   Hyperlipidemia    Reviewed Lipid panel      Relevant Medications   hydrochlorothiazide (HYDRODIURIL) 25 MG tablet   lisinopril (PRINIVIL,ZESTRIL) 10 MG tablet   amLODipine (NORVASC) 5 MG tablet   atorvastatin (LIPITOR) 10 MG tablet   warfarin (COUMADIN) 2 MG tablet   Other Relevant Orders   Lipid Panel [name], [name] (Completed)       Follow up plan: Return in about 2 weeks (around 11/15/2014).  Add TSH to INR next visit due to complaints of fatigue

## 2014-11-01 NOTE — Assessment & Plan Note (Signed)
INR is 3.6. Will continue with warfarin 2mg  two times per week and 1mg  five times per week and recheck in two weeks.

## 2014-11-01 NOTE — Assessment & Plan Note (Signed)
Blood pressure has been elevated for several months. Add lisinopril 10mg  to current regimen. Will recheck in two weeks.

## 2014-11-02 LAB — COMPREHENSIVE METABOLIC PANEL
A/G RATIO: 1.8 (ref 1.1–2.5)
ALBUMIN: 4.7 g/dL (ref 3.5–5.5)
ALK PHOS: 68 IU/L (ref 39–117)
ALT: 26 IU/L (ref 0–44)
AST: 26 IU/L (ref 0–40)
BILIRUBIN TOTAL: 0.4 mg/dL (ref 0.0–1.2)
BUN / CREAT RATIO: 13 (ref 9–20)
BUN: 10 mg/dL (ref 6–24)
CHLORIDE: 92 mmol/L — AB (ref 97–108)
CO2: 24 mmol/L (ref 18–29)
Calcium: 9.5 mg/dL (ref 8.7–10.2)
Creatinine, Ser: 0.8 mg/dL (ref 0.76–1.27)
GFR calc non Af Amer: 98 mL/min/{1.73_m2} (ref >59)
GFR, EST AFRICAN AMERICAN: 113 mL/min/{1.73_m2} (ref >59)
GLUCOSE: 90 mg/dL (ref 65–99)
Globulin, Total: 2.6 g/dL (ref 1.5–4.5)
Potassium: 3.7 mmol/L (ref 3.5–5.2)
Sodium: 135 mmol/L (ref 134–144)
Total Protein: 7.3 g/dL (ref 6.0–8.5)

## 2014-11-02 LAB — URIC ACID: URIC ACID: 7.5 mg/dL (ref 3.7–8.6)

## 2014-11-15 ENCOUNTER — Encounter: Payer: Self-pay | Admitting: Unknown Physician Specialty

## 2014-11-15 ENCOUNTER — Ambulatory Visit (INDEPENDENT_AMBULATORY_CARE_PROVIDER_SITE_OTHER): Payer: No Typology Code available for payment source | Admitting: Unknown Physician Specialty

## 2014-11-15 VITALS — BP 151/83 | HR 58 | Temp 98.3°F | Ht 71.0 in | Wt 201.2 lb

## 2014-11-15 DIAGNOSIS — I825Y9 Chronic embolism and thrombosis of unspecified deep veins of unspecified proximal lower extremity: Secondary | ICD-10-CM | POA: Diagnosis not present

## 2014-11-15 DIAGNOSIS — R5382 Chronic fatigue, unspecified: Secondary | ICD-10-CM | POA: Diagnosis not present

## 2014-11-15 DIAGNOSIS — I10 Essential (primary) hypertension: Secondary | ICD-10-CM

## 2014-11-15 LAB — COAGUCHEK XS/INR WAIVED
INR: 2.6 — ABNORMAL HIGH (ref 0.9–1.1)
Prothrombin Time: 31.6 s

## 2014-11-15 NOTE — Assessment & Plan Note (Signed)
Increase Lisinopril to 20 mg.  He will take 2 of the 10mg  that he has

## 2014-11-15 NOTE — Patient Instructions (Signed)
Take 2 Lisinopril for a total of 20 mg.

## 2014-11-15 NOTE — Assessment & Plan Note (Signed)
Stable INR.  Recheck one month

## 2014-11-15 NOTE — Progress Notes (Signed)
BP 151/83 mmHg  Pulse 58  Temp(Src) 98.3 F (36.8 C)  Ht 5\' 11"  (1.803 m)  Wt 201 lb 3.2 oz (91.264 kg)  BMI 28.07 kg/m2  SpO2 98%   Subjective:    Patient ID: Dwayne Jacobson, male    DOB: 1955-05-09, 59 y.o.   MRN: [id number]  HPI: Dwayne Jacobson is a 59 y.o. male  Chief Complaint  Patient presents with  . Follow-up    Hypertension & Hyperlipdemia. Also checking TSH and INR for complaints of fatigue.    Relevant past medical, surgical, family and social history reviewed and updated as indicated. Interim medical history since our last visit reviewed. Allergies and medications reviewed and updated.  Subjective:  This patient is a 59 year old male who presents for coumadin management. The expected duration of coumadin treatment is . H4   The reason for anticoagulation is  history of DVT/PE. H6   Present Coumadin dose:2mg  x 2days / 1mg  x 5days Goal: 2.0-3.0   Excessive bruising:   no H8  Nose bleeding:   no H8  Rectal bleeding:   no H8  Prolonged menstrual cycles:   H8  Eating diet with consistent amounts of foods containing Vitamin K:  H7  Any recent antibiotic use?   no H7   Fatigue Feeling fatigued for "quite some time." States he is sleepy and can fall asleep at any time.  He does not snore nor is overweight.  Does not fall asleep at the wheel.  States he usually gets 7-8 hours of sleep. Not unusual to sleep for 10 hours at night.    Hypertension Added Lisinopril last visit.  Still elevated today.    Current INR:  PT: 31.6 INR: 2.6  Last CBC: HGB: 15.0 on 09/29/2012   HCT: 44.3 on 09/29/2012       Review of Systems  Per HPI unless specifically indicated above     Objective:    BP 151/83 mmHg  Pulse 58  Temp(Src) 98.3 F (36.8 C)  Ht 5\' 11"  (1.803 m)  Wt 201 lb 3.2 oz (91.264 kg)  BMI 28.07 kg/m2  SpO2 98%  Wt Readings from Last 3 Encounters:  11/15/14 201 lb 3.2 oz (91.264 kg)  11/01/14 196 lb 12.8 oz (89.268 kg)  09/13/14 200 lb 6.4 oz  (90.901 kg)    Physical Exam  Constitutional: He is oriented to person, place, and time. He appears well-developed and well-nourished. No distress.  HENT:  Head: Normocephalic and atraumatic.  Eyes: Conjunctivae and lids are normal. Right eye exhibits no discharge. Left eye exhibits no discharge. No scleral icterus.  Cardiovascular: Normal rate and regular rhythm.   Pulmonary/Chest: Effort normal. No respiratory distress.  Abdominal: Normal appearance and bowel sounds are normal. He exhibits no distension. There is no splenomegaly or hepatomegaly. There is no tenderness.  Musculoskeletal: Normal range of motion.  Neurological: He is alert and oriented to person, place, and time.  Skin: Skin is intact. No rash noted. No pallor.  Psychiatric: He has a normal mood and affect. His behavior is normal. Judgment and thought content normal.    Current INR:  PT: 31.6 INR: 2.6      Assessment & Plan:   Problem List Items Addressed This Visit      Unprioritized   DVT (deep venous thrombosis) ([facility]) - Primary    Stable INR.  Recheck one month      Relevant Orders   CoaguChek XS/INR [name]   Hypertension  Increase Lisinopril to 20 mg.  He will take 2 of the 10mg  that he has       Other Visit Diagnoses    Chronic fatigue        Drew TSH.  Add Vit D, B12 and CBC to next blood draw in one month    Relevant Orders    TSH        Follow up plan: Add CBC to INR.

## 2014-11-16 LAB — TSH: TSH: 1.61 u[IU]/mL (ref 0.450–4.500)

## 2014-11-18 ENCOUNTER — Encounter: Payer: Self-pay | Admitting: Unknown Physician Specialty

## 2014-11-18 NOTE — Progress Notes (Signed)
Quick Note:  Normal labs. Patient notified by letter. ______ 

## 2014-12-20 ENCOUNTER — Ambulatory Visit: Payer: No Typology Code available for payment source | Admitting: Unknown Physician Specialty

## 2014-12-25 ENCOUNTER — Other Ambulatory Visit: Payer: Self-pay | Admitting: Unknown Physician Specialty

## 2014-12-30 ENCOUNTER — Other Ambulatory Visit: Payer: Self-pay

## 2014-12-30 MED ORDER — LISINOPRIL 10 MG PO TABS: 10.0000 mg | ORAL_TABLET | Freq: Every day | ORAL | Status: DC

## 2014-12-30 NOTE — Telephone Encounter (Signed)
LAST VISIT: 11/15/2014 PHARMACY: [facility] noted that the patient states he's been taking 2 a day now. Directions of Rx says take one daily.  Pharmacy noted to please send in new Rx with directions stating 2 a day.

## 2015-01-03 ENCOUNTER — Ambulatory Visit (INDEPENDENT_AMBULATORY_CARE_PROVIDER_SITE_OTHER): Payer: No Typology Code available for payment source | Admitting: Unknown Physician Specialty

## 2015-01-03 ENCOUNTER — Encounter: Payer: Self-pay | Admitting: Unknown Physician Specialty

## 2015-01-03 VITALS — BP 136/83 | HR 60 | Temp 98.4°F | Ht 71.2 in | Wt 195.2 lb

## 2015-01-03 DIAGNOSIS — I825Y9 Chronic embolism and thrombosis of unspecified deep veins of unspecified proximal lower extremity: Secondary | ICD-10-CM | POA: Diagnosis not present

## 2015-01-03 DIAGNOSIS — Z23 Encounter for immunization: Secondary | ICD-10-CM | POA: Diagnosis not present

## 2015-01-03 DIAGNOSIS — E785 Hyperlipidemia, unspecified: Secondary | ICD-10-CM

## 2015-01-03 DIAGNOSIS — D689 Coagulation defect, unspecified: Secondary | ICD-10-CM

## 2015-01-03 DIAGNOSIS — I10 Essential (primary) hypertension: Secondary | ICD-10-CM

## 2015-01-03 LAB — LIPID PANEL PICCOLO, WAIVED
CHOL/HDL RATIO PICCOLO,WAIVE: 3.4 mg/dL
Cholesterol Piccolo, Waived: 176 mg/dL (ref <200)
HDL CHOL PICCOLO, WAIVED: 53 mg/dL — AB (ref >59)
LDL CHOL CALC PICCOLO WAIVED: 106 mg/dL — AB (ref <100)
TRIGLYCERIDES PICCOLO,WAIVED: 88 mg/dL (ref <150)
VLDL CHOL CALC PICCOLO,WAIVE: 18 mg/dL (ref <30)

## 2015-01-03 LAB — MICROALBUMIN, URINE WAIVED
Creatinine, Urine Waived: 50 mg/dL (ref 10–300)
MICROALB, UR WAIVED: 10 mg/L (ref 0–19)

## 2015-01-03 LAB — COAGUCHEK XS/INR WAIVED
INR: 2.9 — ABNORMAL HIGH (ref 0.9–1.1)
Prothrombin Time: 34.9 s

## 2015-01-03 MED ORDER — LISINOPRIL 20 MG PO TABS: 20.0000 mg | ORAL_TABLET | Freq: Every day | ORAL | Status: DC

## 2015-01-03 NOTE — Progress Notes (Signed)
BP 136/83 mmHg  Pulse 60  Temp(Src) 98.4 F (36.9 C)  Ht 5' 11.2" (1.808 m)  Wt 195 lb 3.2 oz (88.542 kg)  BMI 27.09 kg/m2  SpO2 99%   Subjective:    Patient ID: Dwayne Jacobson, male    DOB: 20-Nov-1955, 59 y.o.   MRN: [id number]  HPI: Dwayne Jacobson is a 59 y.o. male  Chief Complaint  Patient presents with  . Coagulation Disorder  . Hypertension    pt states he is now taking 2 tablets daily of his lisinopril so he needs a new prescription stating that.   Hypertension Increased Lisinopril last visit.   Using medications without difficulty Average home BPs not checking   No problems or lightheadedness No chest pain with exertion or shortness of breath No Edema   Hyperlipidemia Using medications without problems: No Muscle aches  Diet compliance: good Exercise: regular with active job  Coumadin Present Coumadin dose:2mg  x 2days / 1mg  x 5days Goal: 2.0-3.0   Excessive bruising:   no H8  Nose bleeding:   no H8  Rectal bleeding:   no H8  Prolonged menstrual cycles:   H8  Eating diet with consistent amounts of foods containing Vitamin K:  H7  Any recent antibiotic use?   no H7      Relevant past medical, surgical, family and social history reviewed and updated as indicated. Interim medical history since our last visit reviewed. Allergies and medications reviewed and updated.  Review of Systems  Constitutional: Negative.   HENT: Negative.   Eyes: Negative.   Respiratory: Negative.   Cardiovascular: Negative.   Gastrointestinal: Negative.   Endocrine: Negative.   Genitourinary: Negative.   Skin: Negative.   Allergic/Immunologic: Negative.   Neurological: Negative.   Hematological: Negative.   Psychiatric/Behavioral: Negative.     Per HPI unless specifically indicated above     Objective:    BP 136/83 mmHg  Pulse 60  Temp(Src) 98.4 F (36.9 C)  Ht 5' 11.2" (1.808 m)  Wt 195 lb 3.2 oz (88.542 kg)  BMI 27.09 kg/m2  SpO2 99%  Wt Readings from Last  3 Encounters:  01/03/15 195 lb 3.2 oz (88.542 kg)  11/15/14 201 lb 3.2 oz (91.264 kg)  11/01/14 196 lb 12.8 oz (89.268 kg)    Physical Exam  Constitutional: He is oriented to person, place, and time. He appears well-developed and well-nourished. No distress.  HENT:  Head: Normocephalic and atraumatic.  Eyes: Conjunctivae and lids are normal. Right eye exhibits no discharge. Left eye exhibits no discharge. No scleral icterus.  Neck: Normal range of motion. Neck supple. No JVD present. Carotid bruit is not present.  Cardiovascular: Normal rate, regular rhythm and normal heart sounds.   Pulmonary/Chest: Effort normal and breath sounds normal. No respiratory distress.  Abdominal: Normal appearance. There is no splenomegaly or hepatomegaly.  Musculoskeletal: Normal range of motion.  Neurological: He is alert and oriented to person, place, and time.  Skin: Skin is warm, dry and intact. No rash noted. No pallor.  Psychiatric: He has a normal mood and affect. His behavior is normal. Judgment and thought content normal.       Assessment & Plan:   Problem List Items Addressed This Visit      Unprioritized   DVT (deep venous thrombosis) (HCC)   Relevant Medications   lisinopril (PRINIVIL,ZESTRIL) 20 MG tablet   Hypertension   Relevant Medications   lisinopril (PRINIVIL,ZESTRIL) 20 MG tablet   Other Relevant Orders  Comprehensive metabolic panel   Microalbumin, Urine [name]   Uric acid   Hyperlipidemia   Relevant Medications   lisinopril (PRINIVIL,ZESTRIL) 20 MG tablet   Other Relevant Orders   Lipid Panel [name], [name]    Other Visit Diagnoses    Coagulation disorder ([place])    -  Primary    INR 2.9    Relevant Orders    CoaguChek XS/INR [name]    Immunization due        Relevant Orders    Flu Vaccine QUAD 36+ mos IM (Completed)       Diagnosis stable.  Continue present meds  Follow up plan: Return in about 1 month (around 02/03/2015) for INR.

## 2015-01-04 LAB — COMPREHENSIVE METABOLIC PANEL
A/G RATIO: 1.8 (ref 1.1–2.5)
ALT: 27 IU/L (ref 0–44)
AST: 21 IU/L (ref 0–40)
Albumin: 4.8 g/dL (ref 3.5–5.5)
Alkaline Phosphatase: 70 IU/L (ref 39–117)
BUN/Creatinine Ratio: 14 (ref 9–20)
BUN: 14 mg/dL (ref 6–24)
Bilirubin Total: 0.3 mg/dL (ref 0.0–1.2)
CALCIUM: 9.8 mg/dL (ref 8.7–10.2)
CO2: 22 mmol/L (ref 18–29)
Chloride: 94 mmol/L — ABNORMAL LOW (ref 97–106)
Creatinine, Ser: 0.98 mg/dL (ref 0.76–1.27)
GFR calc Af Amer: 97 mL/min/{1.73_m2} (ref >59)
GFR, EST NON AFRICAN AMERICAN: 84 mL/min/{1.73_m2} (ref >59)
GLOBULIN, TOTAL: 2.6 g/dL (ref 1.5–4.5)
Glucose: 98 mg/dL (ref 65–99)
POTASSIUM: 4.7 mmol/L (ref 3.5–5.2)
SODIUM: 135 mmol/L — AB (ref 136–144)
Total Protein: 7.4 g/dL (ref 6.0–8.5)

## 2015-01-04 LAB — URIC ACID: Uric Acid: 7.2 mg/dL (ref 3.7–8.6)

## 2015-02-07 ENCOUNTER — Ambulatory Visit: Payer: No Typology Code available for payment source | Admitting: Unknown Physician Specialty

## 2015-02-14 ENCOUNTER — Ambulatory Visit: Payer: No Typology Code available for payment source | Admitting: Unknown Physician Specialty

## 2015-02-19 ENCOUNTER — Encounter: Payer: Self-pay | Admitting: Unknown Physician Specialty

## 2015-02-19 ENCOUNTER — Ambulatory Visit: Payer: No Typology Code available for payment source | Admitting: Unknown Physician Specialty

## 2015-02-19 ENCOUNTER — Ambulatory Visit (INDEPENDENT_AMBULATORY_CARE_PROVIDER_SITE_OTHER): Payer: Managed Care, Other (non HMO) | Admitting: Unknown Physician Specialty

## 2015-02-19 VITALS — BP 118/70 | HR 67 | Temp 98.2°F | Ht 69.9 in | Wt 194.6 lb

## 2015-02-19 DIAGNOSIS — I825Y9 Chronic embolism and thrombosis of unspecified deep veins of unspecified proximal lower extremity: Secondary | ICD-10-CM | POA: Diagnosis not present

## 2015-02-19 DIAGNOSIS — R5382 Chronic fatigue, unspecified: Secondary | ICD-10-CM | POA: Diagnosis not present

## 2015-02-19 LAB — COAGUCHEK XS/INR WAIVED
INR: 2.1 — AB (ref 0.9–1.1)
Prothrombin Time: 25.7 s

## 2015-02-19 NOTE — Progress Notes (Signed)
   BP 118/70 mmHg  Pulse 67  Temp(Src) 98.2 F (36.8 C)  Ht 5' 9.9" (1.775 m)  Wt 194 lb 9.6 oz (88.27 kg)  BMI 28.02 kg/m2  SpO2 98%   Subjective:    Patient ID: Dwayne Jacobson, male    DOB: 1955-11-01, 60 y.o.   MRN: [id number]  HPI: Dwayne Jacobson is a 60 y.o. male  Chief Complaint  Patient presents with  . DVT    Relevant past medical, surgical, family and social history reviewed and updated as indicated. Interim medical history since our last visit reviewed. Allergies and medications reviewed and updated.  Subjective:  This patient is a 60 year old male who presents for coumadin management. The expected duration of coumadin treatment is . H4   The reason for anticoagulation is  history of DVT/PE. H6   Present Coumadin dose:2mg  x 2days / 1mg  x 5days Goal: 2.0-3.0   Excessive bruising:   no H8  Nose bleeding:   no H8  Rectal bleeding:   no H8  Prolonged menstrual cycles:   H8  Eating diet with consistent amounts of foods containing Vitamin K:  H7  Any recent antibiotic use?   no H7   Objective:   General:  Well appearing, well nourished in no distress.  Normal mood and affect.  O1 Skin:  No excessive bruising or rash  O9  Current INR:  PT: 25.9 INR: 2.1  Last CBC: HGB: 15.0 on 09/29/2012   HCT: 44.3 on 09/29/2012       Review of Systems  Per HPI unless specifically indicated above     Objective:    BP 118/70 mmHg  Pulse 67  Temp(Src) 98.2 F (36.8 C)  Ht 5' 9.9" (1.775 m)  Wt 194 lb 9.6 oz (88.27 kg)  BMI 28.02 kg/m2  SpO2 98%  Wt Readings from Last 3 Encounters:  02/19/15 194 lb 9.6 oz (88.27 kg)  01/03/15 195 lb 3.2 oz (88.542 kg)  11/15/14 201 lb 3.2 oz (91.264 kg)    Physical Exam  Constitutional: He is oriented to person, place, and time. He appears well-developed and well-nourished. No distress.  HENT:  Head: Normocephalic and atraumatic.  Eyes: Conjunctivae and lids are normal. Right eye exhibits no discharge. Left eye exhibits no  discharge. No scleral icterus.  Cardiovascular: Normal rate and regular rhythm.   Pulmonary/Chest: Effort normal. No respiratory distress.  Abdominal: Normal appearance and bowel sounds are normal. He exhibits no distension. There is no splenomegaly or hepatomegaly. There is no tenderness.  Musculoskeletal: Normal range of motion.  Neurological: He is alert and oriented to person, place, and time.  Skin: Skin is intact. No rash noted. No pallor.  Psychiatric: He has a normal mood and affect. His behavior is normal. Judgment and thought content normal.    Current INR:  PT: 25.7 INR: 2.1      Assessment & Plan:   Problem List Items Addressed This Visit      Unprioritized   DVT (deep venous thrombosis) ([place]) - Primary   Relevant Orders   CoaguChek XS/INR [name]       Follow up plan: Return in about 1 month (around 03/22/2015).

## 2015-02-20 ENCOUNTER — Encounter: Payer: Self-pay | Admitting: Unknown Physician Specialty

## 2015-02-20 LAB — CBC WITH DIFFERENTIAL/PLATELET
BASOS ABS: 0 10*3/uL (ref 0.0–0.2)
Basos: 1 %
EOS (ABSOLUTE): 0.2 10*3/uL (ref 0.0–0.4)
EOS: 5 %
Hematocrit: 40.1 % (ref 37.5–51.0)
Hemoglobin: 14 g/dL (ref 12.6–17.7)
IMMATURE GRANS (ABS): 0 10*3/uL (ref 0.0–0.1)
Immature Granulocytes: 0 %
LYMPHS: 29 %
Lymphocytes Absolute: 1.5 10*3/uL (ref 0.7–3.1)
MCH: 31.2 pg (ref 26.6–33.0)
MCHC: 34.9 g/dL (ref 31.5–35.7)
MCV: 89 fL (ref 79–97)
MONOCYTES: 8 %
Monocytes Absolute: 0.4 10*3/uL (ref 0.1–0.9)
NEUTROS ABS: 2.9 10*3/uL (ref 1.4–7.0)
Neutrophils: 57 %
PLATELETS: 297 10*3/uL (ref 150–379)
RBC: 4.49 x10E6/uL (ref 4.14–5.80)
RDW: 14.1 % (ref 12.3–15.4)
WBC: 5 10*3/uL (ref 3.4–10.8)

## 2015-03-21 ENCOUNTER — Encounter: Payer: Self-pay | Admitting: Unknown Physician Specialty

## 2015-03-21 ENCOUNTER — Ambulatory Visit (INDEPENDENT_AMBULATORY_CARE_PROVIDER_SITE_OTHER): Payer: Managed Care, Other (non HMO) | Admitting: Unknown Physician Specialty

## 2015-03-21 VITALS — BP 123/78 | HR 59 | Temp 98.1°F | Ht 71.6 in | Wt 197.4 lb

## 2015-03-21 DIAGNOSIS — I825Y9 Chronic embolism and thrombosis of unspecified deep veins of unspecified proximal lower extremity: Secondary | ICD-10-CM

## 2015-03-21 LAB — COAGUCHEK XS/INR WAIVED
INR: 1.5 — ABNORMAL HIGH (ref 0.9–1.1)
Prothrombin Time: 18.1 s

## 2015-03-21 NOTE — Progress Notes (Signed)
   BP 123/78 mmHg  Pulse 59  Temp(Src) 98.1 F (36.7 C)  Ht 5' 11.6" (1.819 m)  Wt 197 lb 6.4 oz (89.54 kg)  BMI 27.06 kg/m2  SpO2 98%   Subjective:    Patient ID: Dwayne Jacobson, male    DOB: August 21, 1955, 60 y.o.   MRN: [id number]  HPI: KAININ HOSP is a 60 y.o. male  Chief Complaint  Patient presents with  . DVT    Relevant past medical, surgical, family and social history reviewed and updated as indicated. Interim medical history since our last visit reviewed. Allergies and medications reviewed and updated.  Subjective:  This patient is a 60 year old male who presents for coumadin management. The expected duration of coumadin treatment is . H4   The reason for anticoagulation is  history of DVT/PE. H6   Present Coumadin dose:2mg  x 2days / 1mg  x 5days Goal: 2.0-3.0   Excessive bruising:   no H8  Nose bleeding:   no H8  Rectal bleeding:   no H8  Prolonged menstrual cycles:   H8  Eating diet with consistent amounts of foods containing Vitamin K:  H7  Any recent antibiotic use?   no H7   Objective:   General:  Well appearing, well nourished in no distress.  Normal mood and affect.  O1 Skin:  No excessive bruising or rash  O9  Current INR:  PT: 18.1 INR: 1.5  Last CBC: HGB: 15.0 on 09/29/2012   HCT: 44.3 on 09/29/2012       Review of Systems  Per HPI unless specifically indicated above     Objective:    BP 123/78 mmHg  Pulse 59  Temp(Src) 98.1 F (36.7 C)  Ht 5' 11.6" (1.819 m)  Wt 197 lb 6.4 oz (89.54 kg)  BMI 27.06 kg/m2  SpO2 98%  Wt Readings from Last 3 Encounters:  03/21/15 197 lb 6.4 oz (89.54 kg)  02/19/15 194 lb 9.6 oz (88.27 kg)  01/03/15 195 lb 3.2 oz (88.542 kg)    Physical Exam  Constitutional: He is oriented to person, place, and time. He appears well-developed and well-nourished. No distress.  HENT:  Head: Normocephalic and atraumatic.  Eyes: Conjunctivae and lids are normal. Right eye exhibits no discharge. Left eye exhibits no  discharge. No scleral icterus.  Cardiovascular: Normal rate and regular rhythm.   Pulmonary/Chest: Effort normal. No respiratory distress.  Abdominal: Normal appearance and bowel sounds are normal. He exhibits no distension. There is no splenomegaly or hepatomegaly. There is no tenderness.  Musculoskeletal: Normal range of motion.  Neurological: He is alert and oriented to person, place, and time.  Skin: Skin is intact. No rash noted. No pallor.  Psychiatric: He has a normal mood and affect. His behavior is normal. Judgment and thought content normal.    Current INR:  PT: 25.7 INR: 2.1      Assessment & Plan:   Problem List Items Addressed This Visit      Unprioritized   DVT (deep venous thrombosis) (HCC) - Primary   Relevant Orders   CoaguChek XS/INR [name]      Increase Coumadin to 2 mg 3 times a week and then 1 mg 4 days/week  Follow up plan: Return in about 2 weeks (around 04/04/2015) for OV and INR.

## 2015-03-30 ENCOUNTER — Other Ambulatory Visit: Payer: Self-pay | Admitting: Unknown Physician Specialty

## 2015-04-11 ENCOUNTER — Encounter: Payer: Self-pay | Admitting: Unknown Physician Specialty

## 2015-04-11 ENCOUNTER — Ambulatory Visit (INDEPENDENT_AMBULATORY_CARE_PROVIDER_SITE_OTHER): Payer: Managed Care, Other (non HMO) | Admitting: Unknown Physician Specialty

## 2015-04-11 VITALS — BP 125/78 | HR 75 | Temp 98.7°F | Wt 198.0 lb

## 2015-04-11 DIAGNOSIS — Z5181 Encounter for therapeutic drug level monitoring: Secondary | ICD-10-CM | POA: Insufficient documentation

## 2015-04-11 DIAGNOSIS — I82519 Chronic embolism and thrombosis of unspecified femoral vein: Secondary | ICD-10-CM

## 2015-04-11 LAB — COAGUCHEK XS/INR WAIVED
INR: 2.6 — AB (ref 0.9–1.1)
PROTHROMBIN TIME: 31.5 s

## 2015-04-11 NOTE — Progress Notes (Signed)
   BP 125/78 mmHg  Pulse 75  Temp(Src) 98.7 F (37.1 C)  Wt 198 lb (89.812 kg)  SpO2 97%   Subjective:    Patient ID: Dwayne Jacobson, male    DOB: 10-Dec-1955, 60 y.o.   MRN: [id number]  HPI: Dwayne Jacobson is a 60 y.o. male  Chief Complaint  Patient presents with  . DVT    PT/INR follow up. 2 mg x 3 days a week. 1 mg x 4 days a week.   Coumadin Present Coumadin dose:2mg  x 3days / 1mg  x 4days Goal: 2.0-3.0   Excessive bruising:   no H8  Nose bleeding:   no H8  Rectal bleeding:   no H8  Prolonged menstrual cycles:   H8  Eating diet with consistent amounts of foods containing Vitamin K:  H7  Any recent antibiotic use?   no H7     Relevant past medical, surgical, family and social history reviewed and updated as indicated. Interim medical history since our last visit reviewed. Allergies and medications reviewed and updated.  Review of Systems  Constitutional: Negative.   HENT: Negative.   Eyes: Negative.   Respiratory: Negative.   Cardiovascular: Negative.   Gastrointestinal: Negative.   Endocrine: Negative.   Genitourinary: Negative.   Skin: Negative.   Allergic/Immunologic: Negative.   Neurological: Negative.   Hematological: Negative.   Psychiatric/Behavioral: Negative.     Per HPI unless specifically indicated above     Objective:    BP 125/78 mmHg  Pulse 75  Temp(Src) 98.7 F (37.1 C)  Wt 198 lb (89.812 kg)  SpO2 97%  Wt Readings from Last 3 Encounters:  04/11/15 198 lb (89.812 kg)  03/21/15 197 lb 6.4 oz (89.54 kg)  02/19/15 194 lb 9.6 oz (88.27 kg)    Physical Exam  Constitutional: He is oriented to person, place, and time. He appears well-developed and well-nourished. No distress.  HENT:  Head: Normocephalic and atraumatic.  Eyes: Conjunctivae and lids are normal. Right eye exhibits no discharge. Left eye exhibits no discharge. No scleral icterus.  Neck: Normal range of motion. Neck supple. No JVD present. Carotid bruit is not present.   Cardiovascular: Normal rate, regular rhythm and normal heart sounds.   Pulmonary/Chest: Effort normal and breath sounds normal. No respiratory distress.  Abdominal: Normal appearance. There is no splenomegaly or hepatomegaly.  Musculoskeletal: Normal range of motion.  Neurological: He is alert and oriented to person, place, and time.  Skin: Skin is warm, dry and intact. No rash noted. No pallor.  Psychiatric: He has a normal mood and affect. His behavior is normal. Judgment and thought content normal.   INR 2.6     Assessment & Plan:   Problem List Items Addressed This Visit      Unprioritized   DVT (deep venous thrombosis) ([place]) - Primary   Relevant Orders   CoaguChek XS/INR [name]   Therapeutic drug monitoring        Follow up plan: Return in about 1 month (around 05/12/2015).

## 2015-05-09 ENCOUNTER — Encounter: Payer: Self-pay | Admitting: Unknown Physician Specialty

## 2015-05-09 ENCOUNTER — Ambulatory Visit (INDEPENDENT_AMBULATORY_CARE_PROVIDER_SITE_OTHER): Payer: Managed Care, Other (non HMO) | Admitting: Unknown Physician Specialty

## 2015-05-09 VITALS — BP 125/82 | HR 51 | Temp 98.5°F | Wt 196.0 lb

## 2015-05-09 DIAGNOSIS — I82519 Chronic embolism and thrombosis of unspecified femoral vein: Secondary | ICD-10-CM | POA: Diagnosis not present

## 2015-05-09 LAB — COAGUCHEK XS/INR WAIVED
INR: 2.3 — AB (ref 0.9–1.1)
PROTHROMBIN TIME: 28.1 s

## 2015-05-09 NOTE — Assessment & Plan Note (Addendum)
Continue present dose.

## 2015-05-09 NOTE — Progress Notes (Signed)
   BP 125/82 mmHg  Pulse 51  Temp(Src) 98.5 F (36.9 C)  Wt 196 lb (88.905 kg)  SpO2 99%   Subjective:    Patient ID: Dwayne Jacobson, male    DOB: May 15, 1955, 60 y.o.   MRN: [id number]  HPI: Dwayne Jacobson is a 60 y.o. male  Chief Complaint  Patient presents with  . DVT    coumadin check, 2mg   -3 days a week and 1mg  on the other days   Coumadin Present Coumadin dose:2mg  x 3days / 1mg  x 4days Goal: 2.0-3.0   Excessive bruising:   no H8  Nose bleeding:   no H8  Rectal bleeding:   no H8  Prolonged menstrual cycles:   H8  Eating diet with consistent amounts of foods containing Vitamin K:  H7  Any recent antibiotic use?   no H7     Relevant past medical, surgical, family and social history reviewed and updated as indicated. Interim medical history since our last visit reviewed. Allergies and medications reviewed and updated.  Review of Systems  Constitutional: Negative.   HENT: Negative.   Eyes: Negative.   Respiratory: Negative.   Cardiovascular: Negative.   Gastrointestinal: Negative.   Endocrine: Negative.   Genitourinary: Negative.   Skin: Negative.   Allergic/Immunologic: Negative.   Neurological: Negative.   Hematological: Negative.   Psychiatric/Behavioral: Negative.     Per HPI unless specifically indicated above     Objective:    BP 125/82 mmHg  Pulse 51  Temp(Src) 98.5 F (36.9 C)  Wt 196 lb (88.905 kg)  SpO2 99%  Wt Readings from Last 3 Encounters:  05/09/15 196 lb (88.905 kg)  04/11/15 198 lb (89.812 kg)  03/21/15 197 lb 6.4 oz (89.54 kg)    Physical Exam  Constitutional: He is oriented to person, place, and time. He appears well-developed and well-nourished. No distress.  HENT:  Head: Normocephalic and atraumatic.  Eyes: Conjunctivae and lids are normal. Right eye exhibits no discharge. Left eye exhibits no discharge. No scleral icterus.  Neck: Normal range of motion. Neck supple. No JVD present. Carotid bruit is not present.   Cardiovascular: Normal rate, regular rhythm and normal heart sounds.   Pulmonary/Chest: Effort normal and breath sounds normal. No respiratory distress.  Abdominal: Normal appearance. There is no splenomegaly or hepatomegaly.  Musculoskeletal: Normal range of motion.  Neurological: He is alert and oriented to person, place, and time.  Skin: Skin is warm, dry and intact. No rash noted. No pallor.  Psychiatric: He has a normal mood and affect. His behavior is normal. Judgment and thought content normal.   INR 2.3     Assessment & Plan:   Problem List Items Addressed This Visit      Unprioritized   DVT (deep venous thrombosis) ([place]) - Primary    Continue present dose       Relevant Orders   CoaguChek XS/INR [name] ([facility])        Follow up plan: Return in about 4 weeks (around 06/06/2015).

## 2015-05-14 ENCOUNTER — Other Ambulatory Visit: Payer: Self-pay | Admitting: Unknown Physician Specialty

## 2015-05-25 ENCOUNTER — Other Ambulatory Visit: Payer: Self-pay | Admitting: Unknown Physician Specialty

## 2015-06-06 ENCOUNTER — Encounter: Payer: Self-pay | Admitting: Unknown Physician Specialty

## 2015-06-06 ENCOUNTER — Ambulatory Visit (INDEPENDENT_AMBULATORY_CARE_PROVIDER_SITE_OTHER): Payer: Managed Care, Other (non HMO) | Admitting: Unknown Physician Specialty

## 2015-06-06 VITALS — BP 123/80 | HR 62 | Temp 98.0°F | Ht 71.2 in | Wt 195.2 lb

## 2015-06-06 DIAGNOSIS — E785 Hyperlipidemia, unspecified: Secondary | ICD-10-CM | POA: Diagnosis not present

## 2015-06-06 DIAGNOSIS — I10 Essential (primary) hypertension: Secondary | ICD-10-CM

## 2015-06-06 DIAGNOSIS — I82519 Chronic embolism and thrombosis of unspecified femoral vein: Secondary | ICD-10-CM | POA: Diagnosis not present

## 2015-06-06 LAB — COAGUCHEK XS/INR WAIVED
INR: 2.4 — AB (ref 0.9–1.1)
Prothrombin Time: 28.4 s

## 2015-06-06 NOTE — Progress Notes (Signed)
   BP 123/80 mmHg  Pulse 62  Temp(Src) 98 F (36.7 C)  Ht 5' 11.2" (1.808 m)  Wt 195 lb 3.2 oz (88.542 kg)  BMI 27.09 kg/m2  SpO2 98%   Subjective:    Patient ID: Dwayne Jacobson, male    DOB: May 06, 1955, 60 y.o.   MRN: [id number]  HPI: Dwayne COUTEE is a 60 y.o. male  Chief Complaint  Patient presents with  . DVT    coumadin check- take 2 mg 3 days a week and 1 mg 4 days a week   Coumadin Present Coumadin dose:2mg  x 3days / 1mg  x 4days Goal: 2.0-3.0   Excessive bruising:   no H8  Nose bleeding:   no H8  Rectal bleeding:   no H8  Prolonged menstrual cycles:   H8  Eating diet with consistent amounts of foods containing Vitamin K:  H7  Any recent antibiotic use?   no H7     Relevant past medical, surgical, family and social history reviewed and updated as indicated. Interim medical history since our last visit reviewed. Allergies and medications reviewed and updated.  Review of Systems  Constitutional: Negative.   HENT: Negative.   Eyes: Negative.   Respiratory: Negative.   Cardiovascular: Negative.   Gastrointestinal: Negative.   Endocrine: Negative.   Genitourinary: Negative.   Skin: Negative.   Allergic/Immunologic: Negative.   Neurological: Negative.   Hematological: Negative.   Psychiatric/Behavioral: Negative.     Per HPI unless specifically indicated above     Objective:    BP 123/80 mmHg  Pulse 62  Temp(Src) 98 F (36.7 C)  Ht 5' 11.2" (1.808 m)  Wt 195 lb 3.2 oz (88.542 kg)  BMI 27.09 kg/m2  SpO2 98%  Wt Readings from Last 3 Encounters:  06/06/15 195 lb 3.2 oz (88.542 kg)  05/09/15 196 lb (88.905 kg)  04/11/15 198 lb (89.812 kg)    Physical Exam  Constitutional: He is oriented to person, place, and time. He appears well-developed and well-nourished. No distress.  HENT:  Head: Normocephalic and atraumatic.  Eyes: Conjunctivae and lids are normal. Right eye exhibits no discharge. Left eye exhibits no discharge. No scleral icterus.  Neck:  Normal range of motion. Neck supple. No JVD present. Carotid bruit is not present.  Cardiovascular: Normal rate, regular rhythm and normal heart sounds.   Pulmonary/Chest: Effort normal and breath sounds normal. No respiratory distress.  Abdominal: Normal appearance. There is no splenomegaly or hepatomegaly.  Musculoskeletal: Normal range of motion.  Neurological: He is alert and oriented to person, place, and time.  Skin: Skin is warm, dry and intact. No rash noted. No pallor.  Psychiatric: He has a normal mood and affect. His behavior is normal. Judgment and thought content normal.   INR 2.4     Assessment & Plan:   Problem List Items Addressed This Visit      Unprioritized   DVT (deep venous thrombosis) ([place]) - Primary   Relevant Orders   CoaguChek XS/INR [name]   CoaguChek XS/INR [name]   Hyperlipidemia   Relevant Orders   Lipid Panel [name], [name]   Hypertension   Relevant Orders   Comprehensive metabolic panel   Uric acid   Microalbumin, Urine [name]        Follow up plan: Return in about 4 weeks (around 07/04/2015) for Will need comprenensive check up at that time.

## 2015-06-20 ENCOUNTER — Other Ambulatory Visit: Payer: Self-pay | Admitting: Unknown Physician Specialty

## 2015-07-18 ENCOUNTER — Ambulatory Visit (INDEPENDENT_AMBULATORY_CARE_PROVIDER_SITE_OTHER): Payer: Managed Care, Other (non HMO) | Admitting: Unknown Physician Specialty

## 2015-07-18 ENCOUNTER — Encounter: Payer: Self-pay | Admitting: Unknown Physician Specialty

## 2015-07-18 DIAGNOSIS — E785 Hyperlipidemia, unspecified: Secondary | ICD-10-CM

## 2015-07-18 DIAGNOSIS — I82519 Chronic embolism and thrombosis of unspecified femoral vein: Secondary | ICD-10-CM

## 2015-07-18 DIAGNOSIS — I10 Essential (primary) hypertension: Secondary | ICD-10-CM | POA: Diagnosis not present

## 2015-07-18 LAB — MICROALBUMIN, URINE WAIVED
CREATININE, URINE WAIVED: 50 mg/dL (ref 10–300)
Microalb, Ur Waived: 10 mg/L (ref 0–19)
Microalb/Creat Ratio: <30 mg/g (ref <30)

## 2015-07-18 LAB — LIPID PANEL PICCOLO, WAIVED
Chol/HDL Ratio Piccolo,Waive: 3.1 mg/dL
Cholesterol Piccolo, Waived: 142 mg/dL (ref <200)
HDL CHOL PICCOLO, WAIVED: 46 mg/dL — AB (ref >59)
LDL CHOL CALC PICCOLO WAIVED: 66 mg/dL (ref <100)
TRIGLYCERIDES PICCOLO,WAIVED: 153 mg/dL — AB (ref <150)
VLDL CHOL CALC PICCOLO,WAIVE: 31 mg/dL — AB (ref <30)

## 2015-07-18 LAB — COAGUCHEK XS/INR WAIVED
INR: 3 — ABNORMAL HIGH (ref 0.9–1.1)
PROTHROMBIN TIME: 36.3 s

## 2015-07-18 NOTE — Assessment & Plan Note (Signed)
Check lipid panel  

## 2015-07-18 NOTE — Assessment & Plan Note (Signed)
INR 3.0

## 2015-07-18 NOTE — Progress Notes (Signed)
   BP 121/69 mmHg  Pulse 74  Temp(Src) 98.6 F (37 C)  Ht 5' 11.5" (1.816 m)  Wt 194 lb 9.6 oz (88.27 kg)  BMI 26.77 kg/m2  SpO2 97%   Subjective:    Patient ID: Dwayne Jacobson, male    DOB: [birth year], 60 y.o.   MRN: [id number]  HPI: Dwayne Jacobson is a 60 y.o. male  Chief Complaint  Patient presents with  . DVT    take coumadin 2 mg 3 days a week and 1 mg 4 days a week  . Hyperlipidemia  . Hypertension   Coumadin Present Coumadin dose:2mg  x 3days / 1mg  x 4days Goal: 2.0-3.0   Excessive bruising:   no H8  Nose bleeding:   no H8  Rectal bleeding:   no H8    H8  Eating diet with consistent amounts of foods containing Vitamin K:  yes H7  Any recent antibiotic use?  no H7   Hypertension Using medications without difficulty Average home BPs Not checking   No problems or lightheadedness No chest pain with exertion or shortness of breath No Edema  Hyperlipidemia Using medications without problems: No Muscle aches  Diet compliance: Watches his greens Exercise: Has a physical job.      Relevant past medical, surgical, family and social history reviewed and updated as indicated. Interim medical history since our last visit reviewed. Allergies and medications reviewed and updated.  Review of Systems  Per HPI unless specifically indicated above     Objective:    BP 121/69 mmHg  Pulse 74  Temp(Src) 98.6 F (37 C)  Ht 5' 11.5" (1.816 m)  Wt 194 lb 9.6 oz (88.27 kg)  BMI 26.77 kg/m2  SpO2 97%  Wt Readings from Last 3 Encounters:  07/18/15 194 lb 9.6 oz (88.27 kg)  06/06/15 195 lb 3.2 oz (88.542 kg)  05/09/15 196 lb (88.905 kg)    Physical Exam  Constitutional: He is oriented to person, place, and time. He appears well-developed and well-nourished. No distress.  HENT:  Head: Normocephalic and atraumatic.  Eyes: Conjunctivae and lids are normal. Right eye exhibits no discharge. Left eye exhibits no discharge. No scleral icterus.  Neck: Normal range of motion.  Neck supple. No JVD present. Carotid bruit is not present.  Cardiovascular: Normal rate, regular rhythm and normal heart sounds.   Pulmonary/Chest: Effort normal and breath sounds normal. No respiratory distress.  Abdominal: Normal appearance. There is no splenomegaly or hepatomegaly.  Musculoskeletal: Normal range of motion.  Neurological: He is alert and oriented to person, place, and time.  Skin: Skin is warm, dry and intact. No rash noted. No pallor.  Psychiatric: He has a normal mood and affect. His behavior is normal. Judgment and thought content normal.    Results for orders placed or performed in visit on 06/06/15  CoaguChek XS/INR [name]  Result Value Ref Range   INR 2.4 (H) 0.9 - 1.1   Prothrombin Time 28.4 sec      Assessment & Plan:   Problem List Items Addressed This Visit      Unprioritized   DVT (deep venous thrombosis) (HCC)    INR 3.0.      Hyperlipidemia    Check lipid panel      Hypertension    Stable, continue present medications.            Follow up plan: Return in about 4 weeks (around 08/15/2015).

## 2015-07-18 NOTE — Assessment & Plan Note (Signed)
Stable, continue present medications.   

## 2015-07-19 LAB — COMPREHENSIVE METABOLIC PANEL
A/G RATIO: 1.8 (ref 1.2–2.2)
ALBUMIN: 4.7 g/dL (ref 3.6–4.8)
ALT: 22 IU/L (ref 0–44)
AST: 24 IU/L (ref 0–40)
Alkaline Phosphatase: 63 IU/L (ref 39–117)
BUN/Creatinine Ratio: 11 (ref 10–24)
BUN: 9 mg/dL (ref 8–27)
Bilirubin Total: 0.3 mg/dL (ref 0.0–1.2)
CALCIUM: 9.5 mg/dL (ref 8.6–10.2)
CHLORIDE: 94 mmol/L — AB (ref 96–106)
CO2: 24 mmol/L (ref 18–29)
Creatinine, Ser: 0.85 mg/dL (ref 0.76–1.27)
GFR, EST AFRICAN AMERICAN: 109 mL/min/{1.73_m2} (ref >59)
GFR, EST NON AFRICAN AMERICAN: 95 mL/min/{1.73_m2} (ref >59)
Globulin, Total: 2.6 g/dL (ref 1.5–4.5)
Glucose: 83 mg/dL (ref 65–99)
Potassium: 4.4 mmol/L (ref 3.5–5.2)
Sodium: 138 mmol/L (ref 134–144)
TOTAL PROTEIN: 7.3 g/dL (ref 6.0–8.5)

## 2015-07-19 LAB — URIC ACID: Uric Acid: 6.4 mg/dL (ref 3.7–8.6)

## 2015-07-21 ENCOUNTER — Encounter: Payer: Self-pay | Admitting: Unknown Physician Specialty

## 2015-07-21 NOTE — Progress Notes (Signed)
Quick Note:  Normal labs. Patient notified by letter. ______ 

## 2015-07-22 ENCOUNTER — Other Ambulatory Visit: Payer: Self-pay | Admitting: Unknown Physician Specialty

## 2015-08-15 ENCOUNTER — Encounter: Payer: Self-pay | Admitting: Unknown Physician Specialty

## 2015-08-15 ENCOUNTER — Ambulatory Visit (INDEPENDENT_AMBULATORY_CARE_PROVIDER_SITE_OTHER): Payer: Managed Care, Other (non HMO) | Admitting: Unknown Physician Specialty

## 2015-08-15 VITALS — BP 128/76 | HR 56 | Temp 98.1°F | Ht 71.5 in | Wt 193.8 lb

## 2015-08-15 DIAGNOSIS — I82519 Chronic embolism and thrombosis of unspecified femoral vein: Secondary | ICD-10-CM | POA: Diagnosis not present

## 2015-08-15 LAB — COAGUCHEK XS/INR WAIVED
INR: 3.7 — AB (ref 0.9–1.1)
Prothrombin Time: 44.1 s

## 2015-08-15 NOTE — Progress Notes (Signed)
   BP 128/76 mmHg  Pulse 56  Temp(Src) 98.1 F (36.7 C)  Ht 5' 11.5" (1.816 m)  Wt 193 lb 12.8 oz (87.907 kg)  BMI 26.66 kg/m2  SpO2 97%   Subjective:    Patient ID: Dwayne Jacobson, male    DOB: 02/10/1955, 60 y.o.   MRN: [id number]  HPI: Dwayne Jacobson is a 60 y.o. male  Chief Complaint  Patient presents with  . DVT   Coumadin Present Coumadin dose:2mg  x 3days / 1mg  x 4days Goal: 2.0-3.0   Excessive bruising:   no H8  Nose bleeding:   no H8  Rectal bleeding:   no H8  Prolonged menstrual cycles:   H8  Eating diet with consistent amounts of foods containing Vitamin K:  H7  Any recent antibiotic use?   no H7     Relevant past medical, surgical, family and social history reviewed and updated as indicated. Interim medical history since our last visit reviewed. Allergies and medications reviewed and updated.  Review of Systems  Constitutional: Negative.   HENT: Negative.   Eyes: Negative.   Respiratory: Negative.   Cardiovascular: Negative.   Gastrointestinal: Negative.   Endocrine: Negative.   Genitourinary: Negative.   Skin: Negative.   Allergic/Immunologic: Negative.   Neurological: Negative.   Hematological: Negative.   Psychiatric/Behavioral: Negative.     Per HPI unless specifically indicated above     Objective:    BP 128/76 mmHg  Pulse 56  Temp(Src) 98.1 F (36.7 C)  Ht 5' 11.5" (1.816 m)  Wt 193 lb 12.8 oz (87.907 kg)  BMI 26.66 kg/m2  SpO2 97%  Wt Readings from Last 3 Encounters:  08/15/15 193 lb 12.8 oz (87.907 kg)  07/18/15 194 lb 9.6 oz (88.27 kg)  06/06/15 195 lb 3.2 oz (88.542 kg)    Physical Exam  Constitutional: He is oriented to person, place, and time. He appears well-developed and well-nourished. No distress.  HENT:  Head: Normocephalic and atraumatic.  Eyes: Conjunctivae and lids are normal. Right eye exhibits no discharge. Left eye exhibits no discharge. No scleral icterus.  Neck: Normal range of motion. Neck supple. No JVD  present. Carotid bruit is not present.  Cardiovascular: Normal rate.   Pulmonary/Chest: Effort normal. No respiratory distress.  Abdominal: Normal appearance. There is no splenomegaly or hepatomegaly.  Musculoskeletal: Normal range of motion.  Neurological: He is alert and oriented to person, place, and time.  Skin: Skin is warm, dry and intact. No rash noted. No pallor.  Psychiatric: He has a normal mood and affect. His behavior is normal. Judgment and thought content normal.   INR 3.7     Assessment & Plan:   Problem List Items Addressed This Visit      Unprioritized   DVT (deep venous thrombosis) ([place]) - Primary   Relevant Orders   CoaguChek XS/INR [name]      INR is higher than typical.  Decrease Coumadin to 2 mg 2 days/week and 1 mg the other day.    Follow up plan: Return in about 4 weeks (around 09/12/2015).

## 2015-08-28 ENCOUNTER — Other Ambulatory Visit: Payer: Self-pay | Admitting: Unknown Physician Specialty

## 2015-08-28 NOTE — Telephone Encounter (Signed)
Your patient 

## 2015-09-19 ENCOUNTER — Ambulatory Visit (INDEPENDENT_AMBULATORY_CARE_PROVIDER_SITE_OTHER): Payer: Managed Care, Other (non HMO) | Admitting: Unknown Physician Specialty

## 2015-09-19 ENCOUNTER — Encounter: Payer: Self-pay | Admitting: Unknown Physician Specialty

## 2015-09-19 VITALS — BP 128/81 | HR 62 | Temp 98.2°F | Ht 71.6 in | Wt 193.2 lb

## 2015-09-19 DIAGNOSIS — I82519 Chronic embolism and thrombosis of unspecified femoral vein: Secondary | ICD-10-CM | POA: Diagnosis not present

## 2015-09-19 LAB — COAGUCHEK XS/INR WAIVED
INR: 3.2 — ABNORMAL HIGH (ref 0.9–1.1)
PROTHROMBIN TIME: 39 s

## 2015-09-19 MED ORDER — WARFARIN SODIUM 2 MG PO TABS: 2.0000 mg | ORAL_TABLET | Freq: Every day | ORAL | 3 refills | Status: DC

## 2015-09-19 NOTE — Progress Notes (Signed)
   BP 128/81 (BP Location: Left Arm, Patient Position: Sitting, Cuff Size: Large)   Pulse 62   Temp 98.2 F (36.8 C)   Ht 5' 11.6" (1.819 m)   Wt 193 lb 3.2 oz (87.6 kg)   SpO2 99%   BMI 26.50 kg/m    Subjective:    Patient ID: Dwayne Jacobson, male    DOB: 15-Dec-1955, 60 y.o.   MRN: [id number]  HPI: Dwayne Jacobson is a 60 y.o. male  Chief Complaint  Patient presents with  . DVT    PT/INR   Coumadin Present Coumadin dose:2mg  x 2days / 1mg  x 5days Goal: 2.0-3.0  Excessive bruising: no H8  Nose bleeding: no H8  Rectal bleeding: no H8  H8  Eating diet with consistent amounts of foods containing Vitamin K: yes H7  Any recent antibiotic use?no H7   Relevant past medical, surgical, family and social history reviewed and updated as indicated. Interim medical history since our last visit reviewed. Allergies and medications reviewed and updated.  Review of Systems  Per HPI unless specifically indicated above     Objective:    BP 128/81 (BP Location: Left Arm, Patient Position: Sitting, Cuff Size: Large)   Pulse 62   Temp 98.2 F (36.8 C)   Ht 5' 11.6" (1.819 m)   Wt 193 lb 3.2 oz (87.6 kg)   SpO2 99%   BMI 26.50 kg/m   Wt Readings from Last 3 Encounters:  09/19/15 193 lb 3.2 oz (87.6 kg)  08/15/15 193 lb 12.8 oz (87.9 kg)  07/18/15 194 lb 9.6 oz (88.3 kg)    Physical Exam  Constitutional: He is oriented to person, place, and time. He appears well-developed and well-nourished. No distress.  HENT:  Head: Normocephalic and atraumatic.  Eyes: Conjunctivae and lids are normal. Right eye exhibits no discharge. Left eye exhibits no discharge. No scleral icterus.  Neck: Normal range of motion. Neck supple. No JVD present. Carotid bruit is not present.  Cardiovascular: Normal rate.   Pulmonary/Chest: Effort normal. No respiratory distress.  Abdominal: Normal appearance. There is no splenomegaly or hepatomegaly.  Musculoskeletal: Normal range of motion.    Neurological: He is alert and oriented to person, place, and time.  Skin: Skin is warm, dry and intact. No rash noted. No pallor.  Psychiatric: He has a normal mood and affect. His behavior is normal. Judgment and thought content normal.   INR in 3.2 Results for orders placed or performed in visit on 08/15/15  CoaguChek XS/INR [name]  Result Value Ref Range   INR 3.7 (H) 0.9 - 1.1   Prothrombin Time 44.1 sec      Assessment & Plan:   Problem List Items Addressed This Visit      Unprioritized   DVT (deep venous thrombosis) (HCC) - Primary   Relevant Medications   warfarin (COUMADIN) 2 MG tablet   Other Relevant Orders   CoaguChek XS/INR [name]    Other Visit Diagnoses   None.      Follow up plan: Return in about 4 weeks (around 10/17/2015).

## 2015-10-31 ENCOUNTER — Ambulatory Visit: Payer: Managed Care, Other (non HMO) | Admitting: Unknown Physician Specialty

## 2015-11-07 ENCOUNTER — Ambulatory Visit (INDEPENDENT_AMBULATORY_CARE_PROVIDER_SITE_OTHER): Payer: Managed Care, Other (non HMO) | Admitting: Unknown Physician Specialty

## 2015-11-07 ENCOUNTER — Encounter: Payer: Self-pay | Admitting: Unknown Physician Specialty

## 2015-11-07 VITALS — BP 117/72 | HR 59 | Temp 98.2°F | Ht 71.5 in | Wt 190.6 lb

## 2015-11-07 DIAGNOSIS — I82519 Chronic embolism and thrombosis of unspecified femoral vein: Secondary | ICD-10-CM | POA: Diagnosis not present

## 2015-11-07 LAB — COAGUCHEK XS/INR WAIVED
INR: 1.8 — ABNORMAL HIGH (ref 0.9–1.1)
Prothrombin Time: 21.9 s

## 2015-11-07 NOTE — Progress Notes (Signed)
   BP 117/72 (BP Location: Left Arm, Patient Position: Sitting, Cuff Size: Large)   Pulse (!) 59   Temp 98.2 F (36.8 C)   Ht 5' 11.5" (1.816 m)   Wt 190 lb 9.6 oz (86.5 kg)   SpO2 95%   BMI 26.21 kg/m    Subjective:    Patient ID: Dwayne Jacobson, male    DOB: 10-28-1955, 60 y.o.   MRN: [id number]  HPI: Dwayne Jacobson is a 60 y.o. male  Chief Complaint  Patient presents with  . DVT    PT/ INR   Coumadin Present Coumadin dose:2mg  x 2days / 1mg  x 5days Goal: 2.0-3.0  Excessive bruising: no H8  Nose bleeding: no H8  Rectal bleeding: no H8  H8  Eating diet with consistent amounts of foods containing Vitamin K: yes H7  Any recent antibiotic use?no H7   Relevant past medical, surgical, family and social history reviewed and updated as indicated. Interim medical history since our last visit reviewed. Allergies and medications reviewed and updated.  Review of Systems  Per HPI unless specifically indicated above     Objective:    BP 117/72 (BP Location: Left Arm, Patient Position: Sitting, Cuff Size: Large)   Pulse (!) 59   Temp 98.2 F (36.8 C)   Ht 5' 11.5" (1.816 m)   Wt 190 lb 9.6 oz (86.5 kg)   SpO2 95%   BMI 26.21 kg/m   Wt Readings from Last 3 Encounters:  11/07/15 190 lb 9.6 oz (86.5 kg)  09/19/15 193 lb 3.2 oz (87.6 kg)  08/15/15 193 lb 12.8 oz (87.9 kg)    Physical Exam  Constitutional: He is oriented to person, place, and time. He appears well-developed and well-nourished. No distress.  HENT:  Head: Normocephalic and atraumatic.  Eyes: Conjunctivae and lids are normal. Right eye exhibits no discharge. Left eye exhibits no discharge. No scleral icterus.  Neck: Normal range of motion. Neck supple. No JVD present. Carotid bruit is not present.  Cardiovascular: Normal rate.   Pulmonary/Chest: Effort normal. No respiratory distress.  Abdominal: Normal appearance. There is no splenomegaly or hepatomegaly.  Musculoskeletal: Normal range of motion.    Neurological: He is alert and oriented to person, place, and time.  Skin: Skin is warm, dry and intact. No rash noted. No pallor.  Psychiatric: He has a normal mood and affect. His behavior is normal. Judgment and thought content normal.   INR is 1.8 Results for orders placed or performed in visit on 09/19/15  CoaguChek XS/INR [name]  Result Value Ref Range   INR 3.2 (H) 0.9 - 1.1   Prothrombin Time 39.0 sec      Assessment & Plan:   Problem List Items Addressed This Visit      Unprioritized   DVT (deep venous thrombosis) ([place]) - Primary   Relevant Orders   CoaguChek XS/INR [name]    Other Visit Diagnoses   None.     Not to goal today but on the same dose for a long time now.  Watch diet and recheck next month.   Follow up plan: Return in about 4 weeks (around 12/05/2015).

## 2015-12-12 ENCOUNTER — Ambulatory Visit: Payer: Managed Care, Other (non HMO) | Admitting: Unknown Physician Specialty

## 2015-12-19 ENCOUNTER — Encounter: Payer: Self-pay | Admitting: Unknown Physician Specialty

## 2015-12-19 ENCOUNTER — Ambulatory Visit (INDEPENDENT_AMBULATORY_CARE_PROVIDER_SITE_OTHER): Payer: Managed Care, Other (non HMO) | Admitting: Unknown Physician Specialty

## 2015-12-19 ENCOUNTER — Ambulatory Visit: Payer: Managed Care, Other (non HMO) | Admitting: Unknown Physician Specialty

## 2015-12-19 VITALS — BP 114/68 | HR 71 | Temp 98.0°F | Wt 194.8 lb

## 2015-12-19 DIAGNOSIS — I82519 Chronic embolism and thrombosis of unspecified femoral vein: Secondary | ICD-10-CM

## 2015-12-19 LAB — COAGUCHEK XS/INR WAIVED
INR: 3.1 — AB (ref 0.9–1.1)
Prothrombin Time: 37.4 s

## 2015-12-19 NOTE — Progress Notes (Signed)
   BP 114/68 (BP Location: Left Arm, Patient Position: Sitting, Cuff Size: Large)   Pulse 71   Temp 98 F (36.7 C)   Wt 194 lb 12.8 oz (88.4 kg)   SpO2 96%   BMI 26.79 kg/m    Subjective:    Patient ID: Dwayne Jacobson, male    DOB: 09-Oct-1955, 60 y.o.   MRN: [id number]  HPI: Dwayne Jacobson is a 60 y.o. male  Chief Complaint  Patient presents with  . DVT    PT/INR   Coumadin Present Coumadin dose:2mg  x 2days / 1mg  x 5days Goal: 2.0-3.0 Excessive bruising:no H8 Nose bleeding:no H8 Rectal bleeding:no H8H8 Eating diet with consistent amounts of foods containing Vitamin K:yes H7 Any recent antibiotic use?no H7  Relevant past medical, surgical, family and social history reviewed and updated as indicated. Interim medical history since our last visit reviewed. Allergies and medications reviewed and updated.  Relevant past medical, surgical, family and social history reviewed and updated as indicated. Interim medical history since our last visit reviewed. Allergies and medications reviewed and updated.  Review of Systems  Per HPI unless specifically indicated above     Objective:    BP 114/68 (BP Location: Left Arm, Patient Position: Sitting, Cuff Size: Large)   Pulse 71   Temp 98 F (36.7 C)   Wt 194 lb 12.8 oz (88.4 kg)   SpO2 96%   BMI 26.79 kg/m   Wt Readings from Last 3 Encounters:  12/19/15 194 lb 12.8 oz (88.4 kg)  11/07/15 190 lb 9.6 oz (86.5 kg)  09/19/15 193 lb 3.2 oz (87.6 kg)    Physical Exam  Constitutional: He is oriented to person, place, and time. He appears well-developed and well-nourished. No distress.  HENT:  Head: Normocephalic and atraumatic.  Eyes: Conjunctivae and lids are normal. Right eye exhibits no discharge. Left eye exhibits no discharge. No scleral icterus.  Neck: Normal range of motion. Neck supple. No JVD present. Carotid bruit is not present.  Pulmonary/Chest: No respiratory distress.  Abdominal: Normal  appearance. There is no splenomegaly or hepatomegaly.  Musculoskeletal: Normal range of motion.  Neurological: He is alert and oriented to person, place, and time.  Skin: Skin is warm, dry and intact. No rash noted. No pallor.  Psychiatric: He has a normal mood and affect. His behavior is normal. Judgment and thought content normal.   INR is 3.1    Assessment & Plan:   Problem List Items Addressed This Visit      Unprioritized   DVT (deep venous thrombosis) ([place]) - Primary   Relevant Orders   CoaguChek XS/INR [name]      Continue present dose.  Recheck in 4 weeks for regular f/u  Follow up plan: Return in about 4 weeks (around 01/16/2016) for 30 minute visit.  Do not send ot lab first.

## 2016-01-03 ENCOUNTER — Other Ambulatory Visit: Payer: Self-pay | Admitting: Unknown Physician Specialty

## 2016-01-16 ENCOUNTER — Ambulatory Visit: Payer: Managed Care, Other (non HMO) | Admitting: Unknown Physician Specialty

## 2016-01-23 ENCOUNTER — Ambulatory Visit: Payer: Managed Care, Other (non HMO) | Admitting: Unknown Physician Specialty

## 2016-01-30 ENCOUNTER — Ambulatory Visit (INDEPENDENT_AMBULATORY_CARE_PROVIDER_SITE_OTHER): Payer: Managed Care, Other (non HMO) | Admitting: Unknown Physician Specialty

## 2016-01-30 ENCOUNTER — Encounter: Payer: Self-pay | Admitting: Unknown Physician Specialty

## 2016-01-30 VITALS — BP 123/77 | HR 60 | Temp 97.9°F | Wt 195.0 lb

## 2016-01-30 DIAGNOSIS — Z114 Encounter for screening for human immunodeficiency virus [HIV]: Secondary | ICD-10-CM | POA: Diagnosis not present

## 2016-01-30 DIAGNOSIS — I10 Essential (primary) hypertension: Secondary | ICD-10-CM

## 2016-01-30 DIAGNOSIS — E782 Mixed hyperlipidemia: Secondary | ICD-10-CM

## 2016-01-30 DIAGNOSIS — I82519 Chronic embolism and thrombosis of unspecified femoral vein: Secondary | ICD-10-CM

## 2016-01-30 DIAGNOSIS — Z1159 Encounter for screening for other viral diseases: Secondary | ICD-10-CM | POA: Diagnosis not present

## 2016-01-30 DIAGNOSIS — Z5181 Encounter for therapeutic drug level monitoring: Secondary | ICD-10-CM | POA: Diagnosis not present

## 2016-01-30 LAB — COAGUCHEK XS/INR WAIVED
INR: 3 — AB (ref 0.9–1.1)
Prothrombin Time: 35.7 s

## 2016-01-30 NOTE — Progress Notes (Signed)
BP 123/77   Pulse 60   Temp 97.9 F (36.6 C)   Wt 195 lb (88.5 kg)   SpO2 99%   BMI 26.82 kg/m    Subjective:    Patient ID: Dwayne Jacobson, male    DOB: [birth year], 60 y.o.   MRN: [id number]  HPI: Dwayne Jacobson is a 60 y.o. male  Chief Complaint  Patient presents with  . Hypertension  . Hyperlipidemia  . Anticoagulation    1mg  x 5 days a week and 2mg  x 2 days a week.  . Lab    he is willing to get the Hep C and HIV tests done   Coumadin Present Coumadin dose:2mg  x 2days / 1mg  x 5days Goal: 2.0-3.0  Excessive bruising: no H8  Nose bleeding: no H8  Rectal bleeding: no H8  H8  Eating diet with consistent amounts of foods containing Vitamin K: yes H7  Any recent antibiotic use?no H7   Hypertension Using medications without difficulty Average home BPs Not checking                    No problems or lightheadedness No chest pain with exertion or shortness of breath No Edema  Hyperlipidemia Using medications without problems: No Muscle aches  Diet compliance: No special diet Exercise: Physical job  Relevant past medical, surgical, family and social history reviewed and updated as indicated. Interim medical history since our last visit reviewed. Allergies and medications reviewed and updated.  Review of Systems  Per HPI unless specifically indicated above     Objective:    BP 123/77   Pulse 60   Temp 97.9 F (36.6 C)   Wt 195 lb (88.5 kg)   SpO2 99%   BMI 26.82 kg/m   Wt Readings from Last 3 Encounters:  01/30/16 195 lb (88.5 kg)  12/19/15 194 lb 12.8 oz (88.4 kg)  11/07/15 190 lb 9.6 oz (86.5 kg)    Physical Exam  Constitutional: He is oriented to person, place, and time. He appears well-developed and well-nourished. No distress.  HENT:  Head: Normocephalic and atraumatic.  Eyes: Conjunctivae and lids are normal. Right eye exhibits no discharge. Left eye exhibits no discharge. No scleral icterus.  Neck: Normal range of motion. Neck  supple. No JVD present. Carotid bruit is not present.  Cardiovascular: Normal rate, regular rhythm and normal heart sounds.   Pulmonary/Chest: Effort normal and breath sounds normal. No respiratory distress.  Abdominal: Normal appearance. There is no splenomegaly or hepatomegaly.  Musculoskeletal: Normal range of motion.  Neurological: He is alert and oriented to person, place, and time.  Skin: Skin is warm, dry and intact. No rash noted. No pallor.  Psychiatric: He has a normal mood and affect. His behavior is normal. Judgment and thought content normal.    Results for orders placed or performed in visit on 12/19/15  CoaguChek XS/INR [name]  Result Value Ref Range   INR 3.1 (H) 0.9 - 1.1   Prothrombin Time 37.4 sec      Assessment & Plan:   Problem List Items Addressed This Visit      Unprioritized   DVT (deep venous thrombosis) ([place])   Relevant Orders   CoaguChek XS/INR [name]   Hyperlipidemia   Relevant Orders   Lipid Panel w/o Chol/HDL Ratio   Hypertension   Relevant Orders   Comprehensive metabolic panel   Uric acid   Therapeutic drug monitoring   Relevant Orders   Comprehensive metabolic panel  CoaguChek XS/INR [name]    Other Visit Diagnoses    Screening for HIV (human immunodeficiency virus)    -  Primary   Relevant Orders   HIV antibody (with reflex)   Need for hepatitis C screening test       Relevant Orders   Hepatitis C Antibody       Follow up plan: Return in about 4 weeks (around 02/27/2016) for 6 months for PE and i month for INR.

## 2016-01-31 LAB — COMPREHENSIVE METABOLIC PANEL
A/G RATIO: 1.6 (ref 1.2–2.2)
ALT: 27 IU/L (ref 0–44)
AST: 26 IU/L (ref 0–40)
Albumin: 4.6 g/dL (ref 3.6–4.8)
Alkaline Phosphatase: 65 IU/L (ref 39–117)
BUN/Creatinine Ratio: 12 (ref 10–24)
BUN: 11 mg/dL (ref 8–27)
CALCIUM: 9.9 mg/dL (ref 8.6–10.2)
CO2: 24 mmol/L (ref 18–29)
Chloride: 96 mmol/L (ref 96–106)
Creatinine, Ser: 0.89 mg/dL (ref 0.76–1.27)
GFR, EST AFRICAN AMERICAN: 107 mL/min/{1.73_m2} (ref >59)
GFR, EST NON AFRICAN AMERICAN: 93 mL/min/{1.73_m2} (ref >59)
GLOBULIN, TOTAL: 2.9 g/dL (ref 1.5–4.5)
Glucose: 93 mg/dL (ref 65–99)
POTASSIUM: 4.7 mmol/L (ref 3.5–5.2)
SODIUM: 137 mmol/L (ref 134–144)
Total Protein: 7.5 g/dL (ref 6.0–8.5)

## 2016-01-31 LAB — LIPID PANEL W/O CHOL/HDL RATIO
Cholesterol, Total: 142 mg/dL (ref 100–199)
HDL: 43 mg/dL (ref >39)
LDL Calculated: 60 mg/dL (ref 0–99)
Triglycerides: 196 mg/dL — ABNORMAL HIGH (ref 0–149)
VLDL Cholesterol Cal: 39 mg/dL (ref 5–40)

## 2016-01-31 LAB — URIC ACID: URIC ACID: 6.4 mg/dL (ref 3.7–8.6)

## 2016-01-31 LAB — HEPATITIS C ANTIBODY

## 2016-01-31 LAB — HIV ANTIBODY (ROUTINE TESTING W REFLEX): HIV SCREEN 4TH GENERATION: NONREACTIVE

## 2016-02-03 ENCOUNTER — Encounter: Payer: Self-pay | Admitting: Unknown Physician Specialty

## 2016-02-27 ENCOUNTER — Encounter: Payer: Self-pay | Admitting: Unknown Physician Specialty

## 2016-02-27 ENCOUNTER — Other Ambulatory Visit: Payer: Self-pay | Admitting: Unknown Physician Specialty

## 2016-02-27 ENCOUNTER — Ambulatory Visit (INDEPENDENT_AMBULATORY_CARE_PROVIDER_SITE_OTHER): Payer: Managed Care, Other (non HMO) | Admitting: Unknown Physician Specialty

## 2016-02-27 VITALS — BP 128/78 | HR 55 | Temp 98.1°F | Wt 195.8 lb

## 2016-02-27 DIAGNOSIS — I82519 Chronic embolism and thrombosis of unspecified femoral vein: Secondary | ICD-10-CM

## 2016-02-27 LAB — COAGUCHEK XS/INR WAIVED
INR: 2.7 — AB (ref 0.9–1.1)
PROTHROMBIN TIME: 32.9 s

## 2016-02-27 NOTE — Progress Notes (Signed)
   BP 128/78 (BP Location: Left Arm, Patient Position: Sitting, Cuff Size: Large)   Pulse (!) 55   Temp 98.1 F (36.7 C)   Wt 195 lb 12.8 oz (88.8 kg) Comment: pt had shoes on  SpO2 99%   BMI 26.93 kg/m    Subjective:    Patient ID: Dwayne Jacobson, male    DOB: [birth year], 61 y.o.   MRN: [id number]  HPI: Dwayne Jacobson is a 61 y.o. male  Chief Complaint  Patient presents with  . DVT    PT/ INR, pt taking 1 mg of warfarin 5 days per week and 2 mg 2 days per week    Coumadin Present Coumadin dose:2mg  x 2days / 1mg  x 5days Goal: 2.0-3.0 Excessive bruising:no H8 Nose bleeding:no H8 Rectal bleeding:no H8H8 Eating diet with consistent amounts of foods containing Vitamin K:yes H7 Any recent antibiotic use?no H7  Relevant past medical, surgical, family and social history reviewed and updated as indicated. Interim medical history since our last visit reviewed. Allergies and medications reviewed and updated.  Review of Systems  Per HPI unless specifically indicated above     Objective:    BP 128/78 (BP Location: Left Arm, Patient Position: Sitting, Cuff Size: Large)   Pulse (!) 55   Temp 98.1 F (36.7 C)   Wt 195 lb 12.8 oz (88.8 kg) Comment: pt had shoes on  SpO2 99%   BMI 26.93 kg/m   Wt Readings from Last 3 Encounters:  02/27/16 195 lb 12.8 oz (88.8 kg)  01/30/16 195 lb (88.5 kg)  12/19/15 194 lb 12.8 oz (88.4 kg)    Physical Exam  Constitutional: He is oriented to person, place, and time. He appears well-developed and well-nourished. No distress.  HENT:  Head: Normocephalic and atraumatic.  Eyes: Conjunctivae and lids are normal. Right eye exhibits no discharge. Left eye exhibits no discharge. No scleral icterus.  Pulmonary/Chest: Effort normal.  Abdominal: Normal appearance. There is no splenomegaly or hepatomegaly.  Musculoskeletal: Normal range of motion.  Neurological: He is alert and oriented to person, place, and time.  Skin: Skin is  intact. No rash noted. No pallor.  Psychiatric: He has a normal mood and affect. His behavior is normal. Judgment and thought content normal.     INR 2.7 Assessment & Plan:   Problem List Items Addressed This Visit      Unprioritized   DVT (deep venous thrombosis) ([place]) - Primary   Relevant Orders   CoaguChek XS/INR [name]      INR stable.  Continue present dose.    Follow up plan: Return in about 4 weeks (around 03/26/2016).

## 2016-04-01 ENCOUNTER — Other Ambulatory Visit: Payer: Self-pay | Admitting: Unknown Physician Specialty

## 2016-04-02 ENCOUNTER — Ambulatory Visit: Payer: Self-pay | Admitting: Unknown Physician Specialty

## 2016-04-05 ENCOUNTER — Ambulatory Visit (INDEPENDENT_AMBULATORY_CARE_PROVIDER_SITE_OTHER): Payer: 59 | Admitting: Unknown Physician Specialty

## 2016-04-05 ENCOUNTER — Encounter: Payer: Self-pay | Admitting: Unknown Physician Specialty

## 2016-04-05 VITALS — BP 146/82 | HR 54 | Temp 97.9°F | Wt 201.6 lb

## 2016-04-05 DIAGNOSIS — I82519 Chronic embolism and thrombosis of unspecified femoral vein: Secondary | ICD-10-CM | POA: Diagnosis not present

## 2016-04-05 LAB — COAGUCHEK XS/INR WAIVED
INR: 3.2 — AB (ref 0.9–1.1)
Prothrombin Time: 38.1 s

## 2016-04-05 NOTE — Progress Notes (Signed)
   BP (!) 146/82 (BP Location: Left Arm, Patient Position: Sitting, Cuff Size: Large)   Pulse (!) 54   Temp 97.9 F (36.6 C)   Wt 201 lb 9.6 oz (91.4 kg) Comment: pt had boots on  SpO2 96%   BMI 27.73 kg/m    Subjective:    Patient ID: Dwayne Jacobson, male    DOB: Oct 13, 1955, 61 y.o.   MRN: [id number]  HPI: Dwayne Jacobson is a 61 y.o. male  Chief Complaint  Patient presents with  . DVT    PT/INR- pt taking 1 mg 5 days per week and 2 mg 2 days per week   Coumadin Present Coumadin dose: 1 mg x 5 days / 2 mg x 2 days Goal: 2.0-3.0. Last INR 2.7 on 02/27/16.  Excessive bruising:No Nose bleeding:No Rectal bleeding:No Eating diet with consistent amounts of foods containing Vitamin K:Yes. Any recent antibiotic use?  Relevant past medical, surgical, family and social history reviewed and updated as indicated. Interim medical history since our last visit reviewed. Allergies and medications reviewed and updated.  Review of Systems  Per HPI unless specifically indicated above     Objective:    BP (!) 146/82 (BP Location: Left Arm, Patient Position: Sitting, Cuff Size: Large)   Pulse (!) 54   Temp 97.9 F (36.6 C)   Wt 201 lb 9.6 oz (91.4 kg) Comment: pt had boots on  SpO2 96%   BMI 27.73 kg/m   Wt Readings from Last 3 Encounters:  04/05/16 201 lb 9.6 oz (91.4 kg)  02/27/16 195 lb 12.8 oz (88.8 kg)  01/30/16 195 lb (88.5 kg)    Physical Exam   Constitutional: Patient is oriented to person, place, and time. Appears well-developed and well-nourished. No distress.  HENT:  Head: Normocephalic and atraumatic.  Eyes: Conjunctivae and lids are normal. Right eye exhibits no discharge. Left eye exhibits no discharge. No scleral icterus.  Neck: Normal range of motion. Neck supple. No JVD present. Carotid bruit is not present.  Cardiovascular: Normal rate, regular rhythm and normal heart sounds.   Pulmonary/Chest: Effort normal and breath sounds normal.  Abdominal: Normal  appearance. Soft. No tenderness, rebound, guarding, distension. Normal bowel sounds.   Musculoskeletal: Normal range of motion.  Neurological: Alert and oriented to person, place, and time.  Skin: Skin is warm, dry and intact. No rash noted. No pallor.  Psychiatric: Has a normal mood and affect. Behavior is normal. Judgment and thought content normal.   Results for orders placed or performed in visit on 02/27/16  CoaguChek XS/INR [name]  Result Value Ref Range   INR 2.7 (H) 0.9 - 1.1   Prothrombin Time 32.9 sec      Assessment & Plan:   Problem List Items Addressed This Visit    DVT (deep venous thrombosis) ([facility]) - Primary    INR 3.2. Continue current medications.       Relevant Orders   CoaguChek XS/INR [name]       Follow up plan: Return in about 4 weeks (around 05/03/2016) for INR and 3 months for physical..

## 2016-04-05 NOTE — Assessment & Plan Note (Signed)
INR 3.2. Continue current medications.

## 2016-04-30 ENCOUNTER — Other Ambulatory Visit: Payer: Self-pay | Admitting: Family Medicine

## 2016-04-30 NOTE — Telephone Encounter (Signed)
Your patient 

## 2016-05-07 ENCOUNTER — Encounter: Payer: Self-pay | Admitting: Unknown Physician Specialty

## 2016-05-07 ENCOUNTER — Ambulatory Visit (INDEPENDENT_AMBULATORY_CARE_PROVIDER_SITE_OTHER): Payer: 59 | Admitting: Unknown Physician Specialty

## 2016-05-07 VITALS — BP 128/85 | HR 56 | Temp 98.0°F | Ht 71.0 in | Wt 198.6 lb

## 2016-05-07 DIAGNOSIS — Z5181 Encounter for therapeutic drug level monitoring: Secondary | ICD-10-CM | POA: Diagnosis not present

## 2016-05-07 DIAGNOSIS — I82519 Chronic embolism and thrombosis of unspecified femoral vein: Secondary | ICD-10-CM | POA: Diagnosis not present

## 2016-05-07 LAB — COAGUCHEK XS/INR WAIVED
INR: 2.7 — ABNORMAL HIGH (ref 0.9–1.1)
Prothrombin Time: 31.8 s

## 2016-05-07 NOTE — Progress Notes (Signed)
   BP 128/85 (BP Location: Left Arm, Patient Position: Sitting, Cuff Size: Large)   Pulse (!) 56   Temp 98 F (36.7 C)   Ht 5\' 11"  (1.803 m) Comment: pt had shoes on  Wt 198 lb 9.6 oz (90.1 kg) Comment: pt had shoes on  SpO2 98%   BMI 27.70 kg/m    Subjective:    Patient ID: Dwayne Jacobson, male    DOB: 09-04-1955, 61 y.o.   MRN: [id number]  HPI: Dwayne Jacobson is a 61 y.o. male  Chief Complaint  Patient presents with  . DVT    PT/INR- pt taking 1 mg of warfarin 5 days per week and 2 mg 2 days per week    Coumadin Present Coumadin dose:2mg  x 2days / 1mg  x 5days Goal: 2.0-3.0 Excessive bruising:none H8 Nose bleeding:none H8 Rectal bleeding:none H8H8 Eating diet with consistent amounts of foods containing Vitamin K:tries to be H7 Any recent antibiotic use?no H7  Relevant past medical, surgical, family and social history reviewed and updated as indicated. Interim medical history since our last visit reviewed. Allergies and medications reviewed and updated.   Review of Systems  Per HPI unless specifically indicated above     Objective:    BP 128/85 (BP Location: Left Arm, Patient Position: Sitting, Cuff Size: Large)   Pulse (!) 56   Temp 98 F (36.7 C)   Ht 5\' 11"  (1.803 m) Comment: pt had shoes on  Wt 198 lb 9.6 oz (90.1 kg) Comment: pt had shoes on  SpO2 98%   BMI 27.70 kg/m   Wt Readings from Last 3 Encounters:  05/07/16 198 lb 9.6 oz (90.1 kg)  04/05/16 201 lb 9.6 oz (91.4 kg)  02/27/16 195 lb 12.8 oz (88.8 kg)    Physical Exam  Constitutional: He is oriented to person, place, and time. He appears well-developed and well-nourished. No distress.  HENT:  Head: Normocephalic and atraumatic.  Eyes: Conjunctivae and lids are normal. Right eye exhibits no discharge. Left eye exhibits no discharge. No scleral icterus.  Cardiovascular: Normal rate.   Pulmonary/Chest: Effort normal.  Abdominal: Normal appearance. There is no splenomegaly or  hepatomegaly.  Musculoskeletal: Normal range of motion.  Neurological: He is alert and oriented to person, place, and time.  Skin: Skin is intact. No rash noted. No pallor.  Psychiatric: He has a normal mood and affect. His behavior is normal. Judgment and thought content normal.   INR is 2.7  Results for orders placed or performed in visit on 04/05/16  CoaguChek XS/INR [name]  Result Value Ref Range   INR 3.2 (H) 0.9 - 1.1   Prothrombin Time 38.1 sec      Assessment & Plan:   Problem List Items Addressed This Visit      Unprioritized   DVT (deep venous thrombosis) ([place]) - Primary   Relevant Orders   CoaguChek XS/INR [name]   Therapeutic drug monitoring    Coumadin is at therapeutic levels with an INR of 2.7.  No refills needed today.  Recheck in 1 months.  Chronic disease f/u in 2 months          Follow up plan: Return in about 4 weeks (around 06/04/2016).

## 2016-05-07 NOTE — Assessment & Plan Note (Signed)
Coumadin is at therapeutic levels with an INR of 2.7.  No refills needed today.  Recheck in 1 months.  Chronic disease f/u in 2 months

## 2016-05-18 ENCOUNTER — Other Ambulatory Visit: Payer: Self-pay | Admitting: Unknown Physician Specialty

## 2016-05-30 ENCOUNTER — Other Ambulatory Visit: Payer: Self-pay | Admitting: Unknown Physician Specialty

## 2016-06-04 ENCOUNTER — Ambulatory Visit (INDEPENDENT_AMBULATORY_CARE_PROVIDER_SITE_OTHER): Payer: 59 | Admitting: Unknown Physician Specialty

## 2016-06-04 ENCOUNTER — Encounter: Payer: Self-pay | Admitting: Unknown Physician Specialty

## 2016-06-04 VITALS — BP 119/73 | HR 60 | Temp 98.2°F | Wt 198.2 lb

## 2016-06-04 DIAGNOSIS — I82519 Chronic embolism and thrombosis of unspecified femoral vein: Secondary | ICD-10-CM | POA: Diagnosis not present

## 2016-06-04 LAB — COAGUCHEK XS/INR WAIVED
INR: 2.6 — ABNORMAL HIGH (ref 0.9–1.1)
PROTHROMBIN TIME: 31.2 s

## 2016-06-04 NOTE — Progress Notes (Signed)
+    BP 119/73   Pulse 60   Temp 98.2 F (36.8 C)   Wt 198 lb 3.2 oz (89.9 kg)   SpO2 98%   BMI 27.64 kg/m    Subjective:    Patient ID: Dwayne Jacobson, male    DOB: April 21, 1955, 61 y.o.   MRN: [id number]  HPI: Dwayne Jacobson is a 61 y.o. male  Chief Complaint  Patient presents with  . DVT    PT/ INR- pt taking 1 mg of warfarin 5 days per week and 2 mg 2 days per week    Coumadin Present Coumadin dose:2mg  x 2days / 1mg  x 5days Goal: 2.0-3.0 Excessive bruising:none noted H8 Nose bleeding:none noted H8 Rectal bleeding:none noted H8H8 Eating diet with consistent amounts of foods containing Vitamin K:tries to but likes greens H7 Any recent antibiotic use?no H7   Relevant past medical, surgical, family and social history reviewed and updated as indicated. Interim medical history since our last visit reviewed. Allergies and medications reviewed and updated.  Review of Systems  Constitutional: Negative.   Respiratory: Negative.   Cardiovascular: Negative.   Psychiatric/Behavioral: Negative.     Per HPI unless specifically indicated above     Objective:    BP 119/73   Pulse 60   Temp 98.2 F (36.8 C)   Wt 198 lb 3.2 oz (89.9 kg)   SpO2 98%   BMI 27.64 kg/m   Wt Readings from Last 3 Encounters:  06/04/16 198 lb 3.2 oz (89.9 kg)  05/07/16 198 lb 9.6 oz (90.1 kg)  04/05/16 201 lb 9.6 oz (91.4 kg)    Physical Exam  Constitutional: He is oriented to person, place, and time. He appears well-developed and well-nourished. No distress.  HENT:  Head: Normocephalic and atraumatic.  Eyes: Conjunctivae and lids are normal. Right eye exhibits no discharge. Left eye exhibits no discharge. No scleral icterus.  Cardiovascular: Normal rate.   Pulmonary/Chest: Effort normal.  Abdominal: Normal appearance. There is no splenomegaly or hepatomegaly.  Musculoskeletal: Normal range of motion.  Neurological: He is alert and oriented to person, place, and time.    Skin: Skin is intact. No rash noted. No pallor.  Psychiatric: He has a normal mood and affect. His behavior is normal. Judgment and thought content normal.    Results for orders placed or performed in visit on 05/07/16  CoaguChek XS/INR [name]  Result Value Ref Range   INR 2.7 (H) 0.9 - 1.1   Prothrombin Time 31.8 sec      Assessment & Plan:   Problem List Items Addressed This Visit      Unprioritized   DVT (deep venous thrombosis) ([place]) - Primary   Relevant Orders   CoaguChek XS/INR [name]      Coumadin monitoring.  Stable, continue present dose.    Follow up plan: Return in about 4 weeks (around 07/02/2016) for for f/u visit and discuss Xarelto.

## 2016-06-30 ENCOUNTER — Other Ambulatory Visit: Payer: Self-pay | Admitting: Unknown Physician Specialty

## 2016-07-30 ENCOUNTER — Encounter: Payer: Managed Care, Other (non HMO) | Admitting: Unknown Physician Specialty

## 2016-08-06 ENCOUNTER — Encounter: Payer: Self-pay | Admitting: Unknown Physician Specialty

## 2016-08-06 ENCOUNTER — Ambulatory Visit (INDEPENDENT_AMBULATORY_CARE_PROVIDER_SITE_OTHER): Payer: 59 | Admitting: Unknown Physician Specialty

## 2016-08-06 VITALS — BP 120/77 | HR 57 | Temp 98.4°F | Ht 71.0 in | Wt 200.8 lb

## 2016-08-06 DIAGNOSIS — Z5181 Encounter for therapeutic drug level monitoring: Secondary | ICD-10-CM | POA: Diagnosis not present

## 2016-08-06 DIAGNOSIS — E782 Mixed hyperlipidemia: Secondary | ICD-10-CM | POA: Diagnosis not present

## 2016-08-06 DIAGNOSIS — I10 Essential (primary) hypertension: Secondary | ICD-10-CM | POA: Diagnosis not present

## 2016-08-06 DIAGNOSIS — I82519 Chronic embolism and thrombosis of unspecified femoral vein: Secondary | ICD-10-CM

## 2016-08-06 DIAGNOSIS — Z0000 Encounter for general adult medical examination without abnormal findings: Secondary | ICD-10-CM

## 2016-08-06 DIAGNOSIS — Z23 Encounter for immunization: Secondary | ICD-10-CM | POA: Diagnosis not present

## 2016-08-06 DIAGNOSIS — M3219 Other organ or system involvement in systemic lupus erythematosus: Secondary | ICD-10-CM

## 2016-08-06 LAB — COAGUCHEK XS/INR WAIVED
INR: 3.6 — ABNORMAL HIGH (ref 0.9–1.1)
Prothrombin Time: 43 s

## 2016-08-06 NOTE — Assessment & Plan Note (Signed)
Check INR today

## 2016-08-06 NOTE — Progress Notes (Signed)
BP 120/77   Pulse (!) 57   Temp 98.4 F (36.9 C)   Ht 5\' 11"  (1.803 m)   Wt 200 lb 12.8 oz (91.1 kg)   SpO2 98%   BMI 28.01 kg/m    Subjective:    Patient ID: Dwayne Jacobson, male    DOB: April 14, 1955, 61 y.o.   MRN: [id number]  HPI: Dwayne Jacobson is a 61 y.o. male  Chief Complaint  Patient presents with  . Annual Exam   Hypertension Using medications without difficulty Average home BPs   No problems or lightheadedness No chest pain with exertion or shortness of breath No Edema  Hyperlipidemia Using medications without problems: No Muscle aches  Diet compliance: Doesn't really monitor Exercise: active job  Coumadin Taking 1 mg 5 days and 2 mg 2 days No bruising or blood in urine or stool  Fatigue States he is feeling fatigue and would like Testosterone checked.  Sleep is not great.  Does not feels rested in the AM.  Doesn't think he snores.    Relevant past medical, surgical, family and social history reviewed and updated as indicated. Interim medical history since our last visit reviewed. Allergies and medications reviewed and updated.  Review of Systems  Constitutional: Negative.   HENT: Negative.   Eyes: Negative.   Respiratory: Negative.   Cardiovascular: Negative.   Gastrointestinal: Negative.   Endocrine: Negative.   Genitourinary: Negative.   Skin: Negative.   Allergic/Immunologic: Negative.   Neurological: Negative.   Hematological: Negative.   Psychiatric/Behavioral: Negative.     Per HPI unless specifically indicated above     Objective:    BP 120/77   Pulse (!) 57   Temp 98.4 F (36.9 C)   Ht 5\' 11"  (1.803 m)   Wt 200 lb 12.8 oz (91.1 kg)   SpO2 98%   BMI 28.01 kg/m   Wt Readings from Last 3 Encounters:  08/06/16 200 lb 12.8 oz (91.1 kg)  06/04/16 198 lb 3.2 oz (89.9 kg)  05/07/16 198 lb 9.6 oz (90.1 kg)    Physical Exam  Constitutional: He is oriented to person, place, and time. He appears well-developed and well-nourished.    HENT:  Head: Normocephalic.  Right Ear: Tympanic membrane, external ear and ear canal normal.  Left Ear: Tympanic membrane, external ear and ear canal normal.  Mouth/Throat: Uvula is midline, oropharynx is clear and moist and mucous membranes are normal.  Eyes: Pupils are equal, round, and reactive to light.  Cardiovascular: Normal rate, regular rhythm and normal heart sounds.  Exam reveals no gallop and no friction rub.   No murmur heard. Pulmonary/Chest: Effort normal and breath sounds normal. No respiratory distress.  Abdominal: Soft. Bowel sounds are normal. He exhibits no distension. There is no tenderness.  Genitourinary: Rectum normal and prostate normal.  Musculoskeletal: Normal range of motion.  Neurological: He is alert and oriented to person, place, and time. He has normal reflexes.  Skin: Skin is warm and dry.  Psychiatric: He has a normal mood and affect. His behavior is normal. Judgment and thought content normal.       Assessment & Plan:   Problem List Items Addressed This Visit      Unprioritized   DVT (deep venous thrombosis) ([place])    Check INR today      Relevant Orders   CoaguChek XS/INR [name]   Hyperlipidemia    Check lipid panel.  Continue Atorvastatin      Relevant Orders  Lipid Panel w/o Chol/HDL Ratio   Hypertension    Stable, continue present medications.        Relevant Orders   Comprehensive metabolic panel   SLE (systemic lupus erythematosus) ([place])    Doing well.  Needs a routine f/u with rheumatology      Relevant Orders   Ambulatory referral to Rheumatology   Therapeutic drug monitoring    INR 3.6.  Will eat spinach tonight.  Make no changes.  Recheck in one month      Relevant Orders   Comprehensive metabolic panel    Other Visit Diagnoses    Need for Td vaccine    -  Primary   Relevant Orders   Td vaccine greater than or equal to 7yo preservative free IM (Completed)   Routine general medical examination at a health care  facility       Relevant Orders   CBC with Differential/Platelet   TSH   PSA   VITAMIN D 25 Hydroxy (Vit-D Deficiency, Fractures)       Follow up plan: No Follow-up on file.

## 2016-08-06 NOTE — Assessment & Plan Note (Signed)
Doing well.  Needs a routine f/u with rheumatology

## 2016-08-06 NOTE — Assessment & Plan Note (Signed)
Stable, continue present medications.   

## 2016-08-06 NOTE — Patient Instructions (Addendum)
Td Vaccine (Tetanus and Diphtheria): What You Need to Know 1. Why get vaccinated? Tetanus  and diphtheria are very serious diseases. They are rare in the [place] today, but people who do become infected often have severe complications. Td vaccine is used to protect adolescents and adults from both of these diseases. Both tetanus and diphtheria are infections caused by bacteria. Diphtheria spreads from person to person through coughing or sneezing. Tetanus-causing bacteria enter the body through cuts, scratches, or wounds. TETANUS (lockjaw) causes painful muscle tightening and stiffness, usually all over the body.  It can lead to tightening of muscles in the head and neck so you can't open your mouth, swallow, or sometimes even breathe. Tetanus kills about 1 out of every 10 people who are infected even after receiving the best medical care.  DIPHTHERIA can cause a thick coating to form in the back of the throat.  It can lead to breathing problems, paralysis, heart failure, and death.  Before vaccines, as many as 200,000 cases of diphtheria and hundreds of cases of tetanus were reported in the [place] each year. Since vaccination began, reports of cases for both diseases have dropped by about 99%. 2. Td vaccine Td vaccine can protect adolescents and adults from tetanus and diphtheria. Td is usually given as a booster dose every 10 years but it can also be given earlier after a severe and dirty wound or burn. Another vaccine, called Tdap, which protects against pertussis in addition to tetanus and diphtheria, is sometimes recommended instead of Td vaccine. Your doctor or the person giving you the vaccine can give you more information. Td may safely be given at the same time as other vaccines. 3. Some people should not get this vaccine  A person who has ever had a life-threatening allergic reaction after a previous dose of any tetanus or diphtheria containing vaccine, OR has a severe  allergy to any part of this vaccine, should not get Td vaccine. Tell the person giving the vaccine about any severe allergies.  Talk to your doctor if you: ? had severe pain or swelling after any vaccine containing diphtheria or tetanus, ? ever had a condition called Guillain Barre Syndrome (GBS), ? aren't feeling well on the day the shot is scheduled. 4. What are the risks from Td vaccine? With any medicine, including vaccines, there is a chance of side effects. These are usually mild and go away on their own. Serious reactions are also possible but are rare. Most people who get Td vaccine do not have any problems with it. Mild problems following Td vaccine: (Did not interfere with activities)  Pain where the shot was given (about 8 people in 10)  Redness or swelling where the shot was given (about 1 person in 4)  Mild fever (rare)  Headache (about 1 person in 4)  Tiredness (about 1 person in 4)  Moderate problems following Td vaccine: (Interfered with activities, but did not require medical attention)  Fever over 102F (rare)  Severe problems following Td vaccine: (Unable to perform usual activities; required medical attention)  Swelling, severe pain, bleeding and/or redness in the arm where the shot was given (rare).  Problems that could happen after any vaccine:  People sometimes faint after a medical procedure, including vaccination. Sitting or lying down for about 15 minutes can help prevent fainting, and injuries caused by a fall. Tell your doctor if you feel dizzy, or have vision changes or ringing in the ears.  Some people get   severe pain in the shoulder and have difficulty moving the arm where a shot was given. This happens very rarely.  Any medication can cause a severe allergic reaction. Such reactions from a vaccine are very rare, estimated at fewer than 1 in a million doses, and would happen within a few minutes to a few hours after the vaccination. As with any  medicine, there is a very remote chance of a vaccine causing a serious injury or death. The safety of vaccines is always being monitored. For more information, visit: www.cdc.gov/vaccinesafety/ 5. What if there is a serious reaction? What should I look for? Look for anything that concerns you, such as signs of a severe allergic reaction, very high fever, or unusual behavior. Signs of a severe allergic reaction can include hives, swelling of the face and throat, difficulty breathing, a fast heartbeat, dizziness, and weakness. These would usually start a few minutes to a few hours after the vaccination. What should I do?  If you think it is a severe allergic reaction or other emergency that can't wait, call 9-1-1 or get the person to the nearest hospital. Otherwise, call your doctor.  Afterward, the reaction should be reported to the Vaccine Adverse Event Reporting System (VAERS). Your doctor might file this report, or you can do it yourself through the VAERS web site at www.vaers.hhs.gov, or by calling 1-800-822-7967. ? VAERS does not give medical advice. 6. The National Vaccine Injury Compensation Program The National Vaccine Injury Compensation Program (VICP) is a federal program that was created to compensate people who may have been injured by certain vaccines. Persons who believe they may have been injured by a vaccine can learn about the program and about filing a claim by calling 1-800-338-2382 or visiting the VICP website at www.hrsa.gov/vaccinecompensation. There is a time limit to file a claim for compensation. 7. How can I learn more?  Ask your doctor. He or she can give you the vaccine package insert or suggest other sources of information.  Call your local or state health department.  Contact the Centers for Disease Control and Prevention (CDC): ? Call 1-800-232-4636 (1-800-CDC-INFO) ? Visit CDC's website at www.cdc.gov/vaccines CDC Td Vaccine VIS (05/13/15) This information is  not intended to replace advice given to you by your health care provider. Make sure you discuss any questions you have with your health care provider. Document Released: 11/15/2005 Document Revised: 10/09/2015 Document Reviewed: 10/09/2015 Elsevier Interactive Patient Education  2017 [employer]vier Inc.  

## 2016-08-06 NOTE — Assessment & Plan Note (Signed)
INR 3.6.  Will eat spinach tonight.  Make no changes.  Recheck in one month

## 2016-08-06 NOTE — Assessment & Plan Note (Signed)
Check lipid panel.  Continue Atorvastatin

## 2016-08-07 LAB — COMPREHENSIVE METABOLIC PANEL
A/G RATIO: 1.8 (ref 1.2–2.2)
ALBUMIN: 5 g/dL — AB (ref 3.6–4.8)
ALK PHOS: 71 IU/L (ref 39–117)
ALT: 35 IU/L (ref 0–44)
AST: 34 IU/L (ref 0–40)
BILIRUBIN TOTAL: 0.3 mg/dL (ref 0.0–1.2)
BUN / CREAT RATIO: 17 (ref 10–24)
BUN: 17 mg/dL (ref 8–27)
CHLORIDE: 94 mmol/L — AB (ref 96–106)
CO2: 24 mmol/L (ref 20–29)
Calcium: 10.5 mg/dL — ABNORMAL HIGH (ref 8.6–10.2)
Creatinine, Ser: 1.02 mg/dL (ref 0.76–1.27)
GFR calc non Af Amer: 79 mL/min/{1.73_m2} (ref >59)
GFR, EST AFRICAN AMERICAN: 91 mL/min/{1.73_m2} (ref >59)
GLOBULIN, TOTAL: 2.8 g/dL (ref 1.5–4.5)
Glucose: 95 mg/dL (ref 65–99)
Potassium: 5 mmol/L (ref 3.5–5.2)
SODIUM: 138 mmol/L (ref 134–144)
TOTAL PROTEIN: 7.8 g/dL (ref 6.0–8.5)

## 2016-08-07 LAB — CBC WITH DIFFERENTIAL/PLATELET
BASOS ABS: 0 10*3/uL (ref 0.0–0.2)
BASOS: 1 %
EOS (ABSOLUTE): 0.2 10*3/uL (ref 0.0–0.4)
EOS: 3 %
HEMATOCRIT: 45.2 % (ref 37.5–51.0)
HEMOGLOBIN: 14.8 g/dL (ref 13.0–17.7)
Immature Grans (Abs): 0 10*3/uL (ref 0.0–0.1)
Immature Granulocytes: 0 %
LYMPHS ABS: 1.5 10*3/uL (ref 0.7–3.1)
Lymphs: 24 %
MCH: 30.2 pg (ref 26.6–33.0)
MCHC: 32.7 g/dL (ref 31.5–35.7)
MCV: 92 fL (ref 79–97)
MONOCYTES: 8 %
Monocytes Absolute: 0.5 10*3/uL (ref 0.1–0.9)
Neutrophils Absolute: 4 10*3/uL (ref 1.4–7.0)
Neutrophils: 64 %
Platelets: 296 10*3/uL (ref 150–379)
RBC: 4.9 x10E6/uL (ref 4.14–5.80)
RDW: 14.3 % (ref 12.3–15.4)
WBC: 6.2 10*3/uL (ref 3.4–10.8)

## 2016-08-07 LAB — LIPID PANEL W/O CHOL/HDL RATIO
CHOLESTEROL TOTAL: 169 mg/dL (ref 100–199)
HDL: 49 mg/dL (ref >39)
LDL CALC: 83 mg/dL (ref 0–99)
Triglycerides: 186 mg/dL — ABNORMAL HIGH (ref 0–149)
VLDL Cholesterol Cal: 37 mg/dL (ref 5–40)

## 2016-08-07 LAB — PSA: Prostate Specific Ag, Serum: 0.6 ng/mL (ref 0.0–4.0)

## 2016-08-07 LAB — TSH: TSH: 2.2 u[IU]/mL (ref 0.450–4.500)

## 2016-08-07 LAB — VITAMIN D 25 HYDROXY (VIT D DEFICIENCY, FRACTURES): VIT D 25 HYDROXY: 32.3 ng/mL (ref 30.0–100.0)

## 2016-08-09 ENCOUNTER — Encounter: Payer: Self-pay | Admitting: Unknown Physician Specialty

## 2016-09-03 ENCOUNTER — Ambulatory Visit: Payer: 59 | Admitting: Unknown Physician Specialty

## 2016-09-10 ENCOUNTER — Ambulatory Visit (INDEPENDENT_AMBULATORY_CARE_PROVIDER_SITE_OTHER): Payer: 59 | Admitting: Unknown Physician Specialty

## 2016-09-10 ENCOUNTER — Encounter: Payer: Self-pay | Admitting: Unknown Physician Specialty

## 2016-09-10 VITALS — BP 125/77 | HR 55 | Temp 97.7°F | Wt 202.0 lb

## 2016-09-10 DIAGNOSIS — I82519 Chronic embolism and thrombosis of unspecified femoral vein: Secondary | ICD-10-CM

## 2016-09-10 LAB — COAGUCHEK XS/INR WAIVED
INR: 3.1 — ABNORMAL HIGH (ref 0.9–1.1)
PROTHROMBIN TIME: 36.8 s

## 2016-09-10 NOTE — Progress Notes (Signed)
BP 125/77   Pulse (!) 55   Temp 97.7 F (36.5 C)   Wt 202 lb (91.6 kg)   SpO2 98%   BMI 28.17 kg/m    Subjective:    Patient ID: Dwayne Jacobson, male    DOB: February 01, 1956, 61 y.o.   MRN: [id number]  HPI: Dwayne Jacobson is a 61 y.o. male  Chief Complaint  Patient presents with  . DVT    PT/ INR   Coumadin Present Coumadin dose:2mg  x 2days / 1mg  x 5days Goal: 2.0-3.0 Last visit INR was 3.6.  Ate more greens Excessive bruising:none H8 Nose bleeding:none H8 Rectal bleeding:no blood in stool or urineH8H8 Eating diet with consistent amounts of foods containing Vitamin K:likes greensH7 Any recent antibiotic use?no H7  Relevant past medical, surgical, family and social history reviewed and updated as indicated. Interim medical history since our last visit reviewed. Allergies and medications reviewed and updated.  Review of Systems  Per HPI unless specifically indicated above     Objective:    BP 125/77   Pulse (!) 55   Temp 97.7 F (36.5 C)   Wt 202 lb (91.6 kg)   SpO2 98%   BMI 28.17 kg/m   Wt Readings from Last 3 Encounters:  09/10/16 202 lb (91.6 kg)  08/06/16 200 lb 12.8 oz (91.1 kg)  06/04/16 198 lb 3.2 oz (89.9 kg)    Physical Exam  Constitutional: He is oriented to person, place, and time. He appears well-developed and well-nourished. No distress.  HENT:  Head: Normocephalic and atraumatic.  Eyes: Conjunctivae and lids are normal. Right eye exhibits no discharge. Left eye exhibits no discharge. No scleral icterus.  Cardiovascular: Normal rate.   Pulmonary/Chest: Effort normal.  Abdominal: Normal appearance. There is no splenomegaly or hepatomegaly.  Musculoskeletal: Normal range of motion.  Neurological: He is alert and oriented to person, place, and time.  Skin: Skin is intact. No rash noted. No pallor.  Psychiatric: He has a normal mood and affect. His behavior is normal. Judgment and thought content normal.    Results for  orders placed or performed in visit on 08/06/16  CBC with Differential/Platelet  Result Value Ref Range   WBC 6.2 3.4 - 10.8 x10E3/uL   RBC 4.90 4.14 - 5.80 x10E6/uL   Hemoglobin 14.8 13.0 - 17.7 g/dL   Hematocrit 45.2 37.5 - 51.0 %   MCV 92 79 - 97 fL   MCH 30.2 26.6 - 33.0 pg   MCHC 32.7 31.5 - 35.7 g/dL   RDW 14.3 12.3 - 15.4 %   Platelets 296 150 - 379 x10E3/uL   Neutrophils 64 Not Estab. %   Lymphs 24 Not Estab. %   Monocytes 8 Not Estab. %   Eos 3 Not Estab. %   Basos 1 Not Estab. %   Neutrophils Absolute 4.0 1.4 - 7.0 x10E3/uL   Lymphocytes Absolute 1.5 0.7 - 3.1 x10E3/uL   Monocytes Absolute 0.5 0.1 - 0.9 x10E3/uL   EOS (ABSOLUTE) 0.2 0.0 - 0.4 x10E3/uL   Basophils Absolute 0.0 0.0 - 0.2 x10E3/uL   Immature Granulocytes 0 Not Estab. %   Immature Grans (Abs) 0.0 0.0 - 0.1 x10E3/uL  Comprehensive metabolic panel  Result Value Ref Range   Glucose 95 65 - 99 mg/dL   BUN 17 8 - 27 mg/dL   Creatinine, Ser 1.02 0.76 - 1.27 mg/dL   GFR calc non Af Amer 79 >59 mL/min/1.73   GFR calc Af Amer 91 >59 mL/min/1.73  BUN/Creatinine Ratio 17 10 - 24   Sodium 138 134 - 144 mmol/L   Potassium 5.0 3.5 - 5.2 mmol/L   Chloride 94 (L) 96 - 106 mmol/L   CO2 24 20 - 29 mmol/L   Calcium 10.5 (H) 8.6 - 10.2 mg/dL   Total Protein 7.8 6.0 - 8.5 g/dL   Albumin 5.0 (H) 3.6 - 4.8 g/dL   Globulin, Total 2.8 1.5 - 4.5 g/dL   Albumin/Globulin Ratio 1.8 1.2 - 2.2   Bilirubin Total 0.3 0.0 - 1.2 mg/dL   Alkaline Phosphatase 71 39 - 117 IU/L   AST 34 0 - 40 IU/L   ALT 35 0 - 44 IU/L  Lipid Panel w/o Chol/HDL Ratio  Result Value Ref Range   Cholesterol, Total 169 100 - 199 mg/dL   Triglycerides 186 (H) 0 - 149 mg/dL   HDL 49 >39 mg/dL   VLDL Cholesterol Cal 37 5 - 40 mg/dL   LDL Calculated 83 0 - 99 mg/dL  TSH  Result Value Ref Range   TSH 2.200 0.450 - 4.500 uIU/mL  PSA  Result Value Ref Range   Prostate Specific Ag, Serum 0.6 0.0 - 4.0 ng/mL  VITAMIN D 25 Hydroxy (Vit-D Deficiency,  Fractures)  Result Value Ref Range   Vit D, 25-Hydroxy 32.3 30.0 - 100.0 ng/mL  CoaguChek XS/INR [name]  Result Value Ref Range   INR 3.6 (H) 0.9 - 1.1   Prothrombin Time 43.0 sec      Assessment & Plan:   Problem List Items Addressed This Visit      Unprioritized   DVT (deep venous thrombosis) ([facility]) - Primary    INR is 3.1.  Continue present dose as stable without symptoms.        Relevant Orders   CoaguChek XS/INR [name]       Follow up plan: Return in about 4 weeks (around 10/08/2016).

## 2016-09-10 NOTE — Assessment & Plan Note (Signed)
INR is 3.1.  Continue present dose as stable without symptoms.

## 2016-09-27 ENCOUNTER — Other Ambulatory Visit: Payer: Self-pay | Admitting: Unknown Physician Specialty

## 2016-10-01 ENCOUNTER — Other Ambulatory Visit: Payer: Self-pay | Admitting: Unknown Physician Specialty

## 2016-10-02 ENCOUNTER — Other Ambulatory Visit: Payer: Self-pay | Admitting: Family Medicine

## 2016-10-05 NOTE — Telephone Encounter (Signed)
Your patient 

## 2016-10-29 ENCOUNTER — Ambulatory Visit: Payer: 59 | Admitting: Unknown Physician Specialty

## 2016-11-05 ENCOUNTER — Encounter: Payer: Self-pay | Admitting: Unknown Physician Specialty

## 2016-11-05 ENCOUNTER — Ambulatory Visit (INDEPENDENT_AMBULATORY_CARE_PROVIDER_SITE_OTHER): Payer: 59 | Admitting: Unknown Physician Specialty

## 2016-11-05 VITALS — BP 125/76 | HR 70 | Wt 204.4 lb

## 2016-11-05 DIAGNOSIS — I82519 Chronic embolism and thrombosis of unspecified femoral vein: Secondary | ICD-10-CM | POA: Diagnosis not present

## 2016-11-05 DIAGNOSIS — Z23 Encounter for immunization: Secondary | ICD-10-CM

## 2016-11-05 LAB — COAGUCHEK XS/INR WAIVED
INR: 2.9 — ABNORMAL HIGH (ref 0.9–1.1)
Prothrombin Time: 34.6 s

## 2016-11-05 NOTE — Assessment & Plan Note (Signed)
On Coumadin.  INR is 2.9.  Continue present dose.  Recheck in 1 month

## 2016-11-05 NOTE — Progress Notes (Signed)
   BP 125/76   Pulse 70   Wt 204 lb 6.4 oz (92.7 kg)   SpO2 98%   BMI 28.51 kg/m    Subjective:    Patient ID: Dwayne Jacobson, male    DOB: 12-09-1955, 61 y.o.   MRN: [id number]  HPI: Dwayne Jacobson is a 61 y.o. male  Chief Complaint  Patient presents with  . DVT    PT/ INR- pt taking 2 mg 2 days a week and 1 mg 5 days a week    Goal is 2.0 to 3.0.  Last INR was 3.1.  No bruising, nose bleeds, rectal bleeding.  Consistent with greens.  No recent antibiotics.    Relevant past medical, surgical, family and social history reviewed and updated as indicated. Interim medical history since our last visit reviewed. Allergies and medications reviewed and updated.  Review of Systems  Per HPI unless specifically indicated above     Objective:    BP 125/76   Pulse 70   Wt 204 lb 6.4 oz (92.7 kg)   SpO2 98%   BMI 28.51 kg/m   Wt Readings from Last 3 Encounters:  11/05/16 204 lb 6.4 oz (92.7 kg)  09/10/16 202 lb (91.6 kg)  08/06/16 200 lb 12.8 oz (91.1 kg)    Physical Exam  Constitutional: He is oriented to person, place, and time. He appears well-developed and well-nourished. No distress.  HENT:  Head: Normocephalic and atraumatic.  Eyes: Conjunctivae and lids are normal. Right eye exhibits no discharge. Left eye exhibits no discharge. No scleral icterus.  Cardiovascular: Normal rate.   Pulmonary/Chest: Effort normal.  Abdominal: Normal appearance. There is no splenomegaly or hepatomegaly.  Musculoskeletal: Normal range of motion.  Neurological: He is alert and oriented to person, place, and time.  Skin: Skin is intact. No rash noted. No pallor.  Psychiatric: He has a normal mood and affect. His behavior is normal. Judgment and thought content normal.    Results for orders placed or performed in visit on 09/10/16  CoaguChek XS/INR [name]  Result Value Ref Range   INR 3.1 (H) 0.9 - 1.1   Prothrombin Time 36.8 sec      Assessment & Plan:   Problem List Items Addressed  This Visit      Unprioritized   DVT (deep venous thrombosis) ([place]) - Primary    On Coumadin.  INR is 2.9.  Continue present dose.  Recheck in 1 month      Relevant Orders   CoaguChek XS/INR [name]    Other Visit Diagnoses    Need for influenza vaccination       Relevant Orders   Flu Vaccine QUAD 36+ mos IM (Completed)      Discussed using xarelto for prophylaxis.  Pt hesitant at this time.  +    Follow up plan: Return in about 4 weeks (around 12/03/2016).

## 2016-11-05 NOTE — Patient Instructions (Addendum)

## 2016-11-22 ENCOUNTER — Other Ambulatory Visit: Payer: Self-pay | Admitting: Unknown Physician Specialty

## 2016-11-29 ENCOUNTER — Other Ambulatory Visit: Payer: Self-pay | Admitting: Unknown Physician Specialty

## 2016-12-03 ENCOUNTER — Ambulatory Visit: Payer: 59 | Admitting: Unknown Physician Specialty

## 2016-12-03 NOTE — Progress Notes (Addendum)
Office Visit Note  Patient: Dwayne Jacobson             Date of Birth: Jan 16, 1956           MRN: [id number]             PCP: Kathrine Haddock, NP Referring: Kathrine Haddock, NP Visit Date: 12/10/2016 Occupation: @GUAROCC @    Subjective:  History of lupus and joint pain.   History of Present Illness: Dwayne Jacobson is a 61 y.o. male seen in consultation per request of his PCP. According to him about 3 years ago he developed a rash on his face. He was seen by dermatologist who diagnosed him with lupus. He was started on Plaquenil and some topical agent. He states the rash got better.He was also evaluated by Dr. Michelene Gardener not all rheumatologist who did lab work and confirmed the diagnosis of history of lupus.he has had no recent outbreak of rash. He states she's been having some knee joint discomfort and possible swelling in his knee joints. He denies any frequent history of oral ulcers and nasal ulcers. He states that he had recent cold and has a healing nasal ulcer in his right nostril.He denies any history of Raynaud's phenomenon.  Activities of Daily Living:  Patient reports morning stiffness for 2 minutes.   Patient Denies nocturnal pain.  Difficulty dressing/grooming: Denies Difficulty climbing stairs: Denies Difficulty getting out of chair: Denies Difficulty using hands for taps, buttons, cutlery, and/or writing: Denies   Review of Systems  Constitutional: Positive for fatigue. Negative for night sweats and weakness ( ).  HENT: Negative for mouth sores, mouth dryness and nose dryness.   Eyes: Negative for redness and dryness.  Respiratory: Negative for shortness of breath and difficulty breathing.   Cardiovascular: Positive for hypertension. Negative for chest pain, palpitations, irregular heartbeat and swelling in legs/feet.  Gastrointestinal: Negative for constipation and diarrhea.  Endocrine: Negative for increased urination.  Musculoskeletal: Positive for arthralgias, joint pain  and morning stiffness. Negative for joint swelling, myalgias, muscle weakness, muscle tenderness and myalgias.  Skin: Positive for rash. Negative for color change, hair loss, nodules/bumps, skin tightness, ulcers and sensitivity to sunlight.  Allergic/Immunologic: Negative for susceptible to infections.  Neurological: Negative for dizziness, fainting, memory loss and night sweats.  Hematological: Negative for swollen glands.  Psychiatric/Behavioral: Negative for depressed mood and sleep disturbance. The patient is not nervous/anxious.     PMFS History:  Patient Active Problem List   Diagnosis Date Noted  . Therapeutic drug monitoring 04/11/2015  . DVT (deep venous thrombosis) ([place])   . Hypertension   . Hyperlipidemia   . SLE (systemic lupus erythematosus) ([place]) 01/18/2014    Past Medical History:  Diagnosis Date  . DVT (deep venous thrombosis) ([place])   . Hyperlipidemia   . Hypertension     Family History  Problem Relation Age of Onset  . Diabetes Mother   . Hypertension Mother   . Hyperlipidemia Mother   . Stroke Mother   . Lung disease Mother   . Heart disease Mother   . Heart disease Father   . Lung disease Father   . Diabetes Sister    History reviewed. No pertinent surgical history. Social History   Social History Narrative  . Not on file     Objective: Vital Signs: BP 135/78 (BP Location: Left Arm, Patient Position: Sitting, Cuff Size: Normal)   Pulse (!) 56   Resp 14   Ht 5\' 11"  (1.803 m)   Wt  203 lb (92.1 kg)   BMI 28.31 kg/m    Physical Exam  Constitutional: He is oriented to person, place, and time. He appears well-developed and well-nourished.  HENT:  Head: Normocephalic and atraumatic.  Eyes: Conjunctivae and EOM are normal. Pupils are equal, round, and reactive to light.  Neck: Normal range of motion. Neck supple.  Cardiovascular: Normal rate, regular rhythm and normal heart sounds.  Pulmonary/Chest: Effort normal and breath sounds normal.    Abdominal: Soft. Bowel sounds are normal.  Neurological: He is alert and oriented to person, place, and time.  Skin: Skin is warm and dry. Capillary refill takes 2 to 3 seconds.  Psychiatric: He has a normal mood and affect. His behavior is normal.  Nursing note and vitals reviewed.    Musculoskeletal Exam: C-spine and thoracic lumbar spine good range of motion. Shoulder joints elbow joints wrist joint MCPs PIPs DIPs with good range of motion. Hip joints knee joints ankles MTPs PIPs DIPs with good range of motion. No warmth swelling or effusion was noted. No synovitis was noted.  CDAI Exam: No CDAI exam completed.    Investigation: No additional findings. 08/06/2016 CBC normal, CMP calcium 10.5, albumin 5.0, TSH normal, vitamin D 32.3  Imaging: Xr Knee 3 View Left  Result Date: 12/10/2016 Moderate medial compartment narrowing with chondrocalcinosis moderate patellofemoral narrowing. Impression: Moderate osteoarthritis and moderate  Chondromalacia patella. Patient also has chondrocalcinosis which can be associated with pseudogout.  Xr Knee 3 View Right  Result Date: 12/10/2016 Moderate medial compartment narrowing with chondrocalcinosis moderate patellofemoral narrowing. Impression: Moderate osteoarthritis and moderate  Chondromalacia patella. Patient also has chondrocalcinosis which can be associated with pseudogout.   Speciality Comments: No specialty comments available.    Procedures:  No procedures performed Allergies: Patient has no known allergies.   Assessment / Plan:     Visit Diagnoses: Other systemic lupus erythematosus with other organ involvement ([place]) - atient has long-standing history of lupus with history of rash and diagnosis of lupus in the past. His been on Plaquenil for last 3 years without any symptoms. He has had no recurrence of rash.Plan: Urinalysis, Routine w reflex microscopic, Sedimentation rate, CK, TSH, ANA, C3 and C4, Glucose 6 phosphate  dehydrogenase, Cardiolipin antibodies, IgG, IgM, IgA, Sjogrens syndrome-B extractable nuclear antibody, Beta-2 glycoprotein antibodies, Anti-DNA antibody, double-stranded, Anti-scleroderma antibody, Sjogrens syndrome-A extractable nuclear antibody, Anti-Smith antibody, RNP Antibody  Patient was counseled on the purpose, proper use, and adverse effects of hydroxychloroquine including nausea/diarrhea, skin rash, headaches, and sun sensitivity.  Discussed importance of annual eye exams while on hydroxychloroquine to monitor to ocular toxicity and discussed importance of frequent laboratory monitoring.  Provided patient with eye exam form for baseline ophthalmologic exam.  Provided patient with educational materials on hydroxychloroquine and answered all questions.  Patient consented to hydroxychloroquine.  Will upload consent in the media tab.     High risk medication use - Plaquenil 200 mg by mouth daily - Plan: CBC with Differential/Platelet, COMPLETE METABOLIC PANEL WITH GFR. Patient reports his eye exam recently was normal.  Bilateral knee swelling - Plan: XR KNEE 3 VIEW RIGHT, XR KNEE 3 VIEW LEFT,x-rays today revealed moderate osteoarthritis and moderate chondromalacia patella he also had chondrocalcinosis.  The intermittent discomfort is experiencing in his knees could be related to chondrocalcinosis. If he continues to have discomfort may consider adding colchicine in future. 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   Orders: Orders Placed This Encounter  Procedures  . XR KNEE 3 VIEW RIGHT  . XR KNEE 3 VIEW LEFT  . CBC with Differential/Platelet  . COMPLETE METABOLIC PANEL WITH GFR  . Urinalysis, Routine w reflex microscopic  . Sedimentation rate  . CK   . TSH  . ANA  . C3 and C4  . Glucose 6 phosphate dehydrogenase  . Cardiolipin antibodies, IgG, IgM, IgA  . Sjogrens syndrome-B extractable nuclear antibody  . Beta-2 glycoprotein antibodies  . Anti-DNA antibody, double-stranded  . Anti-scleroderma antibody  . Sjogrens syndrome-A extractable nuclear antibody  . Anti-Smith antibody  . RNP Antibody  . Magnesium  . Iron, TIBC and Ferritin Panel   No orders of the defined types were placed in this encounter.   Face-to-face time spent with patient was 45 minutes.Greater than 50% of time was spent in counseling and coordination of care.  Follow-Up Instructions: Return for Systemic lupus.   Bo Merino, MD  Note - This record has been created using Editor, commissioning.  Chart creation errors have been sought, but may not always  have been located. Such creation errors do not reflect on  the standard of medical care.

## 2016-12-10 ENCOUNTER — Ambulatory Visit: Payer: 59 | Admitting: Unknown Physician Specialty

## 2016-12-10 ENCOUNTER — Ambulatory Visit (INDEPENDENT_AMBULATORY_CARE_PROVIDER_SITE_OTHER): Payer: Self-pay

## 2016-12-10 ENCOUNTER — Encounter: Payer: Self-pay | Admitting: Unknown Physician Specialty

## 2016-12-10 ENCOUNTER — Ambulatory Visit (INDEPENDENT_AMBULATORY_CARE_PROVIDER_SITE_OTHER): Payer: 59

## 2016-12-10 ENCOUNTER — Ambulatory Visit: Payer: 59 | Admitting: Rheumatology

## 2016-12-10 ENCOUNTER — Encounter: Payer: Self-pay | Admitting: Rheumatology

## 2016-12-10 ENCOUNTER — Encounter (INDEPENDENT_AMBULATORY_CARE_PROVIDER_SITE_OTHER): Payer: Self-pay

## 2016-12-10 VITALS — BP 135/78 | HR 56 | Resp 14 | Ht 71.0 in | Wt 203.0 lb

## 2016-12-10 VITALS — BP 127/82 | HR 59 | Temp 98.3°F | Wt 201.8 lb

## 2016-12-10 DIAGNOSIS — M25461 Effusion, right knee: Secondary | ICD-10-CM

## 2016-12-10 DIAGNOSIS — M25462 Effusion, left knee: Secondary | ICD-10-CM

## 2016-12-10 DIAGNOSIS — M3219 Other organ or system involvement in systemic lupus erythematosus: Secondary | ICD-10-CM

## 2016-12-10 DIAGNOSIS — Z79899 Other long term (current) drug therapy: Secondary | ICD-10-CM | POA: Diagnosis not present

## 2016-12-10 DIAGNOSIS — M1120 Other chondrocalcinosis, unspecified site: Secondary | ICD-10-CM

## 2016-12-10 DIAGNOSIS — I82519 Chronic embolism and thrombosis of unspecified femoral vein: Secondary | ICD-10-CM

## 2016-12-10 DIAGNOSIS — E782 Mixed hyperlipidemia: Secondary | ICD-10-CM | POA: Diagnosis not present

## 2016-12-10 DIAGNOSIS — Z5181 Encounter for therapeutic drug level monitoring: Secondary | ICD-10-CM | POA: Diagnosis not present

## 2016-12-10 DIAGNOSIS — I10 Essential (primary) hypertension: Secondary | ICD-10-CM

## 2016-12-10 LAB — COAGUCHEK XS/INR WAIVED
INR: 3.2 — AB (ref 0.9–1.1)
PROTHROMBIN TIME: 38.5 s

## 2016-12-10 NOTE — Assessment & Plan Note (Signed)
INR is 3.2.  Contiue present dose

## 2016-12-10 NOTE — Progress Notes (Signed)
   BP 127/82   Pulse (!) 59   Temp 98.3 F (36.8 C) (Oral)   Wt 201 lb 12.8 oz (91.5 kg)   SpO2 99%   BMI 28.15 kg/m    Subjective:    Patient ID: Dwayne Jacobson, male    DOB: 03-12-1955, 61 y.o.   MRN: [id number]  HPI: Dwayne Jacobson is a 61 y.o. male  Chief Complaint  Patient presents with  . DVT    PT/INR- pt taking 2 mg 2 days per week and 1 mg 5 days per week    Pt is here for f/u of INR.  He is taking the doses above.  No rectal bleeding.  No bruising.  Denies headache.  Diet consistent  Relevant past medical, surgical, family and social history reviewed and updated as indicated. Interim medical history since our last visit reviewed. Allergies and medications reviewed and updated.  Review of Systems  Per HPI unless specifically indicated above     Objective:    BP 127/82   Pulse (!) 59   Temp 98.3 F (36.8 C) (Oral)   Wt 201 lb 12.8 oz (91.5 kg)   SpO2 99%   BMI 28.15 kg/m   Wt Readings from Last 3 Encounters:  12/10/16 201 lb 12.8 oz (91.5 kg)  12/10/16 203 lb (92.1 kg)  11/05/16 204 lb 6.4 oz (92.7 kg)    Physical Exam  Constitutional: He is oriented to person, place, and time. He appears well-developed and well-nourished. No distress.  HENT:  Head: Normocephalic and atraumatic.  Eyes: Conjunctivae and lids are normal. Right eye exhibits no discharge. Left eye exhibits no discharge. No scleral icterus.  Cardiovascular: Normal rate.  Pulmonary/Chest: Effort normal.  Abdominal: Normal appearance. There is no splenomegaly or hepatomegaly.  Musculoskeletal: Normal range of motion.  Neurological: He is alert and oriented to person, place, and time.  Skin: Skin is intact. No rash noted. No pallor.  Psychiatric: He has a normal mood and affect. His behavior is normal. Judgment and thought content normal.    Results for orders placed or performed in visit on 11/05/16  CoaguChek XS/INR [name]  Result Value Ref Range   INR 2.9 (H) 0.9 - 1.1   Prothrombin  Time 34.6 sec      Assessment & Plan:   Problem List Items Addressed This Visit      Unprioritized   DVT (deep venous thrombosis) ([place]) - Primary   Relevant Orders   CoaguChek XS/INR [name]   Therapeutic drug monitoring    INR is 3.2.  Contiue present dose          Follow up plan: Return in about 4 weeks (around 01/07/2017) for chronic disease management.  Do not get INR first.

## 2016-12-10 NOTE — Patient Instructions (Addendum)

## 2016-12-14 LAB — BETA-2 GLYCOPROTEIN ANTIBODIES
Beta-2 Glyco 1 IgA: 14 SAU (ref <=20)
Beta-2 Glyco 1 IgM: <9 SMU (ref <=20)

## 2016-12-14 LAB — CARDIOLIPIN ANTIBODIES, IGG, IGM, IGA: Anticardiolipin IgG: <14 [GPL'U]

## 2016-12-14 LAB — CBC WITH DIFFERENTIAL/PLATELET
BASOS ABS: 80 {cells}/uL (ref 0–200)
Basophils Relative: 1.4 %
EOS ABS: 291 {cells}/uL (ref 15–500)
EOS PCT: 5.1 %
HEMATOCRIT: 42 % (ref 38.5–50.0)
HEMOGLOBIN: 14.4 g/dL (ref 13.2–17.1)
Lymphs Abs: 1436 cells/uL (ref 850–3900)
MCH: 30.5 pg (ref 27.0–33.0)
MCHC: 34.3 g/dL (ref 32.0–36.0)
MCV: 89 fL (ref 80.0–100.0)
MPV: 10 fL (ref 7.5–12.5)
Monocytes Relative: 10.9 %
NEUTROS ABS: 3272 {cells}/uL (ref 1500–7800)
Neutrophils Relative %: 57.4 %
Platelets: 335 10*3/uL (ref 140–400)
RBC: 4.72 10*6/uL (ref 4.20–5.80)
RDW: 13 % (ref 11.0–15.0)
Total Lymphocyte: 25.2 %
WBC: 5.7 10*3/uL (ref 3.8–10.8)
WBCMIX: 621 {cells}/uL (ref 200–950)

## 2016-12-14 LAB — IRON,TIBC AND FERRITIN PANEL
%SAT: 22 % (calc) (ref 15–60)
FERRITIN: 750 ng/mL — AB (ref 20–380)
IRON: 80 ug/dL (ref 50–180)
TIBC: 357 ug/dL (ref 250–425)

## 2016-12-14 LAB — C3 AND C4
C3 Complement: 147 mg/dL (ref 82–185)
C4 Complement: 32 mg/dL (ref 15–53)

## 2016-12-14 LAB — MAGNESIUM: Magnesium: 2.1 mg/dL (ref 1.5–2.5)

## 2016-12-14 LAB — URINALYSIS, ROUTINE W REFLEX MICROSCOPIC
BILIRUBIN URINE: NEGATIVE
GLUCOSE, UA: NEGATIVE
Hgb urine dipstick: NEGATIVE
Ketones, ur: NEGATIVE
Leukocytes, UA: NEGATIVE
Nitrite: NEGATIVE
PH: 7 (ref 5.0–8.0)
PROTEIN: NEGATIVE
SPECIFIC GRAVITY, URINE: 1.006 (ref 1.001–1.03)

## 2016-12-14 LAB — COMPLETE METABOLIC PANEL WITH GFR
AG Ratio: 1.5 (calc) (ref 1.0–2.5)
ALBUMIN MSPROF: 4.8 g/dL (ref 3.6–5.1)
ALKALINE PHOSPHATASE (APISO): 62 U/L (ref 40–115)
ALT: 26 U/L (ref 9–46)
AST: 23 U/L (ref 10–35)
BUN: 10 mg/dL (ref 7–25)
CALCIUM: 9.9 mg/dL (ref 8.6–10.3)
CO2: 27 mmol/L (ref 20–32)
CREATININE: 0.85 mg/dL (ref 0.70–1.25)
Chloride: 98 mmol/L (ref 98–110)
GFR, EST NON AFRICAN AMERICAN: 94 mL/min/{1.73_m2} (ref >=60)
GFR, Est African American: 109 mL/min/{1.73_m2} (ref >=60)
GLOBULIN: 3.3 g/dL (ref 1.9–3.7)
Glucose, Bld: 96 mg/dL (ref 65–99)
Potassium: 4.1 mmol/L (ref 3.5–5.3)
SODIUM: 136 mmol/L (ref 135–146)
Total Bilirubin: 0.3 mg/dL (ref 0.2–1.2)
Total Protein: 8.1 g/dL (ref 6.1–8.1)

## 2016-12-14 LAB — ANTI-DNA ANTIBODY, DOUBLE-STRANDED: ds DNA Ab: 7 IU/mL — ABNORMAL HIGH

## 2016-12-14 LAB — ANTI-SCLERODERMA ANTIBODY: SCLERODERMA (SCL-70) (ENA) ANTIBODY, IGG: NEGATIVE AI

## 2016-12-14 LAB — RNP ANTIBODY: RIBONUCLEIC PROTEIN(ENA) ANTIBODY, IGG: NEGATIVE AI

## 2016-12-14 LAB — SJOGRENS SYNDROME-B EXTRACTABLE NUCLEAR ANTIBODY: SSB (La) (ENA) Antibody, IgG: <1 AI

## 2016-12-14 LAB — SEDIMENTATION RATE: Sed Rate: 19 mm/h (ref 0–20)

## 2016-12-14 LAB — ANA: ANA: NEGATIVE

## 2016-12-14 LAB — GLUCOSE 6 PHOSPHATE DEHYDROGENASE: G-6PDH: 14.2 U/g{Hb} (ref 7.0–20.5)

## 2016-12-14 LAB — TSH: TSH: 1.84 m[IU]/L (ref 0.40–4.50)

## 2016-12-14 LAB — CK: CK TOTAL: 70 U/L (ref 44–196)

## 2016-12-14 LAB — SJOGRENS SYNDROME-A EXTRACTABLE NUCLEAR ANTIBODY: SSA (RO) (ENA) ANTIBODY, IGG: POSITIVE AI — AB

## 2016-12-14 LAB — ANTI-SMITH ANTIBODY: ENA SM Ab Ser-aCnc: <1 AI

## 2016-12-14 NOTE — Progress Notes (Signed)
Labs are consistent with autoimmune disease. We'll discuss at follow-up visit.

## 2016-12-24 ENCOUNTER — Other Ambulatory Visit: Payer: Self-pay | Admitting: Unknown Physician Specialty

## 2016-12-25 ENCOUNTER — Other Ambulatory Visit: Payer: Self-pay | Admitting: Unknown Physician Specialty

## 2016-12-28 ENCOUNTER — Other Ambulatory Visit: Payer: Self-pay | Admitting: Unknown Physician Specialty

## 2017-01-07 ENCOUNTER — Ambulatory Visit (INDEPENDENT_AMBULATORY_CARE_PROVIDER_SITE_OTHER): Payer: 59 | Admitting: Unknown Physician Specialty

## 2017-01-07 ENCOUNTER — Encounter: Payer: Self-pay | Admitting: Unknown Physician Specialty

## 2017-01-07 VITALS — BP 136/74 | HR 61 | Temp 98.0°F | Wt 208.2 lb

## 2017-01-07 DIAGNOSIS — E782 Mixed hyperlipidemia: Secondary | ICD-10-CM | POA: Diagnosis not present

## 2017-01-07 DIAGNOSIS — I82419 Acute embolism and thrombosis of unspecified femoral vein: Secondary | ICD-10-CM | POA: Diagnosis not present

## 2017-01-07 DIAGNOSIS — I10 Essential (primary) hypertension: Secondary | ICD-10-CM

## 2017-01-07 DIAGNOSIS — Z5181 Encounter for therapeutic drug level monitoring: Secondary | ICD-10-CM

## 2017-01-07 LAB — COAGUCHEK XS/INR WAIVED
INR: 3.8 — AB (ref 0.9–1.1)
Prothrombin Time: 45.6 s

## 2017-01-07 NOTE — Assessment & Plan Note (Addendum)
INR is 3.8.  Skip dose tomorrow.  Skip 2 mg dose next week.  Then restart on 1 mg a day but 2 mg 2 days a week.  Recheck in 2 weeks.

## 2017-01-07 NOTE — Assessment & Plan Note (Signed)
Stable, continue present medications.   

## 2017-01-07 NOTE — Progress Notes (Signed)
BP 136/74   Pulse 61   Temp 98 F (36.7 C) (Oral)   Wt 208 lb 3.2 oz (94.4 kg)   SpO2 96%   BMI 29.04 kg/m    Subjective:    Patient ID: Dwayne Jacobson, male    DOB: 02-18-1955, 61 y.o.   MRN: [id number]  HPI: Dwayne Jacobson is a 61 y.o. male  Chief Complaint  Patient presents with  . DVT    pt taking 2 mg 2 days per week and 1 mg 5 days per week  . Hypertension  . Hyperlipidemia   Hypertension Using medications without difficulty Average home BPs   No problems or lightheadedness No chest pain with exertion or shortness of breath No Edema   Hyperlipidemia Using medications without problems: No Muscle aches  Diet compliance: Exercise:  Pt is here for f/u of INR.  He is taking the doses above.  No rectal bleeding.  No bruising.  Denies headache.  Diet consistent  Relevant past medical, surgical, family and social history reviewed and updated as indicated. Interim medical history since our last visit reviewed. Allergies and medications reviewed and updated.  Review of Systems  Constitutional: Negative.   HENT: Negative.   Eyes: Negative.   Respiratory: Negative.   Cardiovascular: Negative.   Gastrointestinal: Negative.   Endocrine: Negative.   Genitourinary: Negative.   Skin: Negative.   Allergic/Immunologic: Negative.   Neurological: Negative.   Hematological: Negative.   Psychiatric/Behavioral: Negative.     Per HPI unless specifically indicated above     Objective:    BP 136/74   Pulse 61   Temp 98 F (36.7 C) (Oral)   Wt 208 lb 3.2 oz (94.4 kg)   SpO2 96%   BMI 29.04 kg/m   Wt Readings from Last 3 Encounters:  01/07/17 208 lb 3.2 oz (94.4 kg)  12/10/16 201 lb 12.8 oz (91.5 kg)  12/10/16 203 lb (92.1 kg)    Physical Exam  Constitutional: He is oriented to person, place, and time. He appears well-developed and well-nourished. No distress.  HENT:  Head: Normocephalic and atraumatic.  Eyes: Conjunctivae and lids are normal. Right eye  exhibits no discharge. Left eye exhibits no discharge. No scleral icterus.  Neck: Normal range of motion. Neck supple. No JVD present. Carotid bruit is not present.  Cardiovascular: Normal rate, regular rhythm and normal heart sounds.  Pulmonary/Chest: Effort normal and breath sounds normal. No respiratory distress.  Abdominal: Normal appearance. There is no splenomegaly or hepatomegaly.  Musculoskeletal: Normal range of motion.  Neurological: He is alert and oriented to person, place, and time.  Skin: Skin is warm, dry and intact. No rash noted. No pallor.  Psychiatric: He has a normal mood and affect. His behavior is normal. Judgment and thought content normal.    Results for orders placed or performed in visit on 12/10/16  CoaguChek XS/INR [name]  Result Value Ref Range   INR 3.2 (H) 0.9 - 1.1   Prothrombin Time 38.5 sec      Assessment & Plan:   Problem List Items Addressed This Visit      Unprioritized   DVT (deep venous thrombosis) ([place]) - Primary   Relevant Orders   CoaguChek XS/INR [name]   Hyperlipidemia    Stable, continue present medications.        Relevant Orders   Lipid Panel w/o Chol/HDL Ratio   Hypertension    Stable, continue present medications.        Relevant Orders  Comprehensive metabolic panel   TSH   Therapeutic drug monitoring    INR is 3.8.  Skip dose tomorrow.  Skip 2 mg dose next week.  Then restart on 1 mg a day but 2 mg 2 days a week.  Recheck in 2 weeks.        Relevant Orders   CBC with Differential/Platelet       Follow up plan: Return in about 2 weeks (around 01/21/2017).

## 2017-01-08 LAB — LIPID PANEL W/O CHOL/HDL RATIO
CHOLESTEROL TOTAL: 178 mg/dL (ref 100–199)
HDL: 48 mg/dL (ref >39)
LDL CALC: 99 mg/dL (ref 0–99)
Triglycerides: 154 mg/dL — ABNORMAL HIGH (ref 0–149)
VLDL Cholesterol Cal: 31 mg/dL (ref 5–40)

## 2017-01-08 LAB — COMPREHENSIVE METABOLIC PANEL
A/G RATIO: 2.1 (ref 1.2–2.2)
ALT: 34 IU/L (ref 0–44)
AST: 25 IU/L (ref 0–40)
Albumin: 5.1 g/dL — ABNORMAL HIGH (ref 3.6–4.8)
Alkaline Phosphatase: 64 IU/L (ref 39–117)
BUN/Creatinine Ratio: 13 (ref 10–24)
BUN: 12 mg/dL (ref 8–27)
Bilirubin Total: 0.3 mg/dL (ref 0.0–1.2)
CALCIUM: 9.8 mg/dL (ref 8.6–10.2)
CO2: 25 mmol/L (ref 20–29)
CREATININE: 0.89 mg/dL (ref 0.76–1.27)
Chloride: 95 mmol/L — ABNORMAL LOW (ref 96–106)
GFR, EST AFRICAN AMERICAN: 107 mL/min/{1.73_m2} (ref >59)
GFR, EST NON AFRICAN AMERICAN: 92 mL/min/{1.73_m2} (ref >59)
Globulin, Total: 2.4 g/dL (ref 1.5–4.5)
Glucose: 99 mg/dL (ref 65–99)
POTASSIUM: 4.9 mmol/L (ref 3.5–5.2)
Sodium: 136 mmol/L (ref 134–144)
TOTAL PROTEIN: 7.5 g/dL (ref 6.0–8.5)

## 2017-01-08 LAB — CBC WITH DIFFERENTIAL/PLATELET
BASOS: 1 %
Basophils Absolute: 0 10*3/uL (ref 0.0–0.2)
EOS (ABSOLUTE): 0.2 10*3/uL (ref 0.0–0.4)
EOS: 3 %
HEMATOCRIT: 41.6 % (ref 37.5–51.0)
HEMOGLOBIN: 13.8 g/dL (ref 13.0–17.7)
IMMATURE GRANS (ABS): 0 10*3/uL (ref 0.0–0.1)
IMMATURE GRANULOCYTES: 0 %
LYMPHS: 23 %
Lymphocytes Absolute: 1.2 10*3/uL (ref 0.7–3.1)
MCH: 30.1 pg (ref 26.6–33.0)
MCHC: 33.2 g/dL (ref 31.5–35.7)
MCV: 91 fL (ref 79–97)
MONOCYTES: 10 %
Monocytes Absolute: 0.6 10*3/uL (ref 0.1–0.9)
NEUTROS PCT: 63 %
Neutrophils Absolute: 3.3 10*3/uL (ref 1.4–7.0)
Platelets: 304 10*3/uL (ref 150–379)
RBC: 4.59 x10E6/uL (ref 4.14–5.80)
RDW: 14.4 % (ref 12.3–15.4)
WBC: 5.4 10*3/uL (ref 3.4–10.8)

## 2017-01-08 LAB — TSH: TSH: 1.8 u[IU]/mL (ref 0.450–4.500)

## 2017-01-11 ENCOUNTER — Encounter: Payer: Self-pay | Admitting: Unknown Physician Specialty

## 2017-01-13 DIAGNOSIS — M1120 Other chondrocalcinosis, unspecified site: Secondary | ICD-10-CM | POA: Insufficient documentation

## 2017-01-13 DIAGNOSIS — M170 Bilateral primary osteoarthritis of knee: Secondary | ICD-10-CM | POA: Insufficient documentation

## 2017-01-13 NOTE — Progress Notes (Deleted)
   Office Visit Note  Patient: Dwayne Jacobson             Date of Birth: 1955-03-03           MRN: [id number]             PCP: Kathrine Haddock, NP Referring: Kathrine Haddock, NP Visit Date: 01/14/2017 Occupation: @GUAROCC @    Subjective:  No chief complaint on file.   History of Present Illness: Dwayne Jacobson is a 61 y.o. male ***   Activities of Daily Living:  Patient reports morning stiffness for *** {minute/hour:19697}.   Patient {ACTIONS;DENIES/REPORTS:21021675::"Denies"} nocturnal pain.  Difficulty dressing/grooming: {ACTIONS;DENIES/REPORTS:21021675::"Denies"} Difficulty climbing stairs: {ACTIONS;DENIES/REPORTS:21021675::"Denies"} Difficulty getting out of chair: {ACTIONS;DENIES/REPORTS:21021675::"Denies"} Difficulty using hands for taps, buttons, cutlery, and/or writing: {ACTIONS;DENIES/REPORTS:21021675::"Denies"}   No Rheumatology ROS completed.   PMFS History:  Patient Active Problem List   Diagnosis Date Noted  . Therapeutic drug monitoring 04/11/2015  . DVT (deep venous thrombosis) ([place])   . Hypertension   . Hyperlipidemia   . SLE (systemic lupus erythematosus) ([place]) 01/18/2014    Past Medical History:  Diagnosis Date  . DVT (deep venous thrombosis) ([place])   . Hyperlipidemia   . Hypertension     Family History  Problem Relation Age of Onset  . Diabetes Mother   . Hypertension Mother   . Hyperlipidemia Mother   . Stroke Mother   . Lung disease Mother   . Heart disease Mother   . Heart disease Father   . Lung disease Father   . Diabetes Sister    No past surgical history on file. Social History   Social History Narrative  . Not on file     Objective: Vital Signs: There were no vitals taken for this visit.   Physical Exam   Musculoskeletal Exam: ***  CDAI Exam: No CDAI exam completed.    Investigation: No additional findings.   Imaging: No results found.  Speciality Comments: No specialty comments available.    Procedures:  No  procedures performed Allergies: Patient has no known allergies.   Assessment / Plan:     Visit Diagnoses: No diagnosis found.    Orders: No orders of the defined types were placed in this encounter.  No orders of the defined types were placed in this encounter.   Face-to-face time spent with patient was *** minutes. 50% of time was spent in counseling and coordination of care.  Follow-Up Instructions: No Follow-up on file.   Bo Merino, MD  Note - This record has been created using Editor, commissioning.  Chart creation errors have been sought, but may not always  have been located. Such creation errors do not reflect on  the standard of medical care.

## 2017-01-13 NOTE — Progress Notes (Signed)
 Office Visit Note  Patient: Dwayne Jacobson             Date of Birth: 02/17/1955           MRN: [id number]             PCP: Wicker, Cheryl, NP Referring: Wicker, Cheryl, NP Visit Date: 01/14/2017 Occupation: @GUAROCC@    Subjective:  Medication management.   History of Present Illness: Dwayne Jacobson is a 61 y.o. male with history of systemic lupus  and osteoarthritis. He states his lupus has been fairly well controlled with him Plaquenil. His been tolerating medication well. He does have some osteoarthritis in his knee joints which causes discomfort off and on. He has not had a flare of pseudogout in a long time. He does get Raynauds with the cold temperatures.  Activities of Daily Living:  Patient reports morning stiffness for 2 minutes.   Patient Denies nocturnal pain.  Difficulty dressing/grooming: Denies Difficulty climbing stairs: Denies Difficulty getting out of chair: Denies Difficulty using hands for taps, buttons, cutlery, and/or writing: Denies   Review of Systems  Constitutional: Positive for fatigue. Negative for night sweats and weakness ( ).  HENT: Negative for mouth sores, mouth dryness and nose dryness.   Eyes: Positive for dryness. Negative for redness.  Respiratory: Negative for shortness of breath and difficulty breathing.   Cardiovascular: Positive for hypertension. Negative for chest pain, palpitations, irregular heartbeat and swelling in legs/feet.  Gastrointestinal: Negative for constipation and diarrhea.  Endocrine: Negative for increased urination.  Musculoskeletal: Positive for arthralgias, joint pain and morning stiffness. Negative for joint swelling, myalgias, muscle weakness, muscle tenderness and myalgias.  Skin: Positive for color change. Negative for rash, hair loss, nodules/bumps, skin tightness, ulcers and sensitivity to sunlight.  Allergic/Immunologic: Negative for susceptible to infections.  Neurological: Negative for dizziness, fainting,  memory loss and night sweats.  Hematological: Negative for swollen glands.  Psychiatric/Behavioral: Negative for depressed mood and sleep disturbance. The patient is not nervous/anxious.     PMFS History:  Patient Active Problem List   Diagnosis Date Noted  . Chondrocalcinosis 01/13/2017  . Primary osteoarthritis of both knees 01/13/2017  . Therapeutic drug monitoring 04/11/2015  . DVT (deep venous thrombosis) (HCC)   . Hypertension   . Hyperlipidemia   . SLE (systemic lupus erythematosus) (HCC) 01/18/2014    Past Medical History:  Diagnosis Date  . DVT (deep venous thrombosis) (HCC)   . Hyperlipidemia   . Hypertension     Family History  Problem Relation Age of Onset  . Diabetes Mother   . Hypertension Mother   . Hyperlipidemia Mother   . Stroke Mother   . Lung disease Mother   . Heart disease Mother   . Heart disease Father   . Lung disease Father   . Diabetes Sister    History reviewed. No pertinent surgical history. Social History   Social History Narrative  . Not on file     Objective: Vital Signs: BP 132/72 (BP Location: Left Arm, Patient Position: Sitting, Cuff Size: Normal)   Pulse (!) 59   Resp 17   Ht 5' 11" (1.803 m)   Wt 208 lb (94.3 kg)   BMI 29.01 kg/m    Physical Exam  Constitutional: He is oriented to person, place, and time. He appears well-developed and well-nourished.  HENT:  Head: Normocephalic and atraumatic.  Eyes: Conjunctivae and EOM are normal. Pupils are equal, round, and reactive to light.  Neck: Normal range of   motion. Neck supple.  Cardiovascular: Normal rate, regular rhythm and normal heart sounds.  Pulmonary/Chest: Effort normal and breath sounds normal.  Abdominal: Soft. Bowel sounds are normal.  Neurological: He is alert and oriented to person, place, and time.  Skin: Skin is warm and dry. Capillary refill takes 2 to 3 seconds.  Psychiatric: He has a normal mood and affect. His behavior is normal.  Nursing note and vitals  reviewed.    Musculoskeletal Exam: C-spine and thoracic lumbar spine good range of motion. Shoulder joints elbow joints wrist joints are good range of motion. He had no synovitis over MCPs or PIP joints. Hip joints with good range of motion. He has some crepitus and his knee joints without any warmth swelling or effusion. Although joints full range of motion with no synovitis.  CDAI Exam: CDAI Homunculus Exam:   Joint Counts:  CDAI Tender Joint count: 0 CDAI Swollen Joint count: 0  Global Assessments:  Patient Global Assessment: 1 Provider Global Assessment: 1  CDAI Calculated Score: 2    Investigation: Findings:  08/06/2016 CBC normal, CMP calcium 10.5, albumin 5.0, TSH normal, vitamin D 32.3  CBC Latest Ref Rng & Units 01/07/2017 12/10/2016 08/06/2016  WBC 3.4 - 10.8 x10E3/uL 5.4 5.7 6.2  Hemoglobin 13.0 - 17.7 g/dL 13.8 14.4 14.8  Hematocrit 37.5 - 51.0 % 41.6 42.0 45.2  Platelets 150 - 379 x10E3/uL 304 335 296   CMP normal, magnesium 2.1, UA negative, CK 70, iron studies normal except ferritin 750 high, TSH normal, ESR 19, G6PD normal,  ANA negative, DS DNA 7, SSA positive, SSB negative, RNP negative, Smith negative, SCL 70 negative, C3-C4 normal, beta-2 GP one negative, anticardiolipin negative, Imaging: No results found.  Speciality Comments: No specialty comments available.    Procedures:  No procedures performed Allergies: Patient has no known allergies.   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 History of rash,diagnosed with lupus in the past. He has been on Plaquenil for the last 3 years. He has no active synovitis on examination his been tolerating Plaquenil well. He continues to have some Raynaud's symptoms. Warm clothing and keeping core temperature warm was discussed.  High risk medication use - Plaquenil 200 mg by mouth twice a day. His labs have been stable.  We had detailed discussion regarding his lab work. He states his eye exam in 2018 was normal. I've advised him to follow up with ophthalmologist on regular basis and forward us the results.  Primary osteoarthritis of both knees - Moderate osteoarthritis with moderate chondromalacia patella. Joint protection and muscle strengthening was discussed.  Chondrocalcinosis - Noted on x-rays of his knee joints. He's not having any flares of pseudogout.  Essential hypertension: His blood pressure is controlled.  Mixed hyperlipidemia  History of DVT (deep vein thrombosis) - On Coumadin    Orders: No orders of the defined types were placed in this encounter.  No orders of the defined types were placed in this encounter.   Face-to-face time spent with patient was 30 minutes. Greater than 50% of time was spent in counseling and coordination of care.  Follow-Up Instructions: Return in about 5 months (around 06/14/2017) for Systemic lupus, Osteoarthritis.   Shaili Deveshwar, MD  Note - This record has been created using Dragon software.  Chart creation errors have been sought, but may not always  have been located. Such creation errors do not reflect on  the   standard of medical care. 

## 2017-01-14 ENCOUNTER — Encounter: Payer: Self-pay | Admitting: Rheumatology

## 2017-01-14 ENCOUNTER — Ambulatory Visit: Payer: 59 | Admitting: Rheumatology

## 2017-01-14 VITALS — BP 132/72 | HR 59 | Resp 17 | Ht 71.0 in | Wt 208.0 lb

## 2017-01-14 DIAGNOSIS — M3219 Other organ or system involvement in systemic lupus erythematosus: Secondary | ICD-10-CM

## 2017-01-14 DIAGNOSIS — Z79899 Other long term (current) drug therapy: Secondary | ICD-10-CM

## 2017-01-14 DIAGNOSIS — Z86718 Personal history of other venous thrombosis and embolism: Secondary | ICD-10-CM

## 2017-01-14 DIAGNOSIS — I10 Essential (primary) hypertension: Secondary | ICD-10-CM

## 2017-01-14 DIAGNOSIS — M170 Bilateral primary osteoarthritis of knee: Secondary | ICD-10-CM | POA: Diagnosis not present

## 2017-01-14 DIAGNOSIS — M1120 Other chondrocalcinosis, unspecified site: Secondary | ICD-10-CM | POA: Diagnosis not present

## 2017-01-14 DIAGNOSIS — E782 Mixed hyperlipidemia: Secondary | ICD-10-CM | POA: Diagnosis not present

## 2017-01-21 ENCOUNTER — Ambulatory Visit: Payer: 59 | Admitting: Unknown Physician Specialty

## 2017-01-21 ENCOUNTER — Encounter: Payer: Self-pay | Admitting: Unknown Physician Specialty

## 2017-01-21 VITALS — BP 126/75 | HR 61 | Temp 98.3°F | Wt 204.8 lb

## 2017-01-21 DIAGNOSIS — I82519 Chronic embolism and thrombosis of unspecified femoral vein: Secondary | ICD-10-CM

## 2017-01-21 LAB — COAGUCHEK XS/INR WAIVED
INR: 2.3 — AB (ref 0.9–1.1)
PROTHROMBIN TIME: 27.8 s

## 2017-01-21 NOTE — Progress Notes (Signed)
BP 126/75   Pulse 61   Temp 98.3 F (36.8 C) (Oral)   Wt 204 lb 12.8 oz (92.9 kg)   SpO2 98%   BMI 28.56 kg/m    Subjective:    Patient ID: Dwayne Jacobson, male    DOB: 16-Nov-1955, 61 y.o.   MRN: [id number]  HPI: SEANPATRICK MAISANO is a 61 y.o. male  Chief Complaint  Patient presents with  . DVT    pt taking 2 mg 2 days per week and 1 mg 5 days per week    Pt is here for f/u of INR. Taking doses consistently as above.  Denies rectal bleeding, bruising.  No headache.  Consistent diet  Relevant past medical, surgical, family and social history reviewed and updated as indicated. Interim medical history since our last visit reviewed. Allergies and medications reviewed and updated.  Review of Systems  Per HPI unless specifically indicated above     Objective:    BP 126/75   Pulse 61   Temp 98.3 F (36.8 C) (Oral)   Wt 204 lb 12.8 oz (92.9 kg)   SpO2 98%   BMI 28.56 kg/m   Wt Readings from Last 3 Encounters:  01/21/17 204 lb 12.8 oz (92.9 kg)  01/14/17 208 lb (94.3 kg)  01/07/17 208 lb 3.2 oz (94.4 kg)    Physical Exam  Constitutional: He is oriented to person, place, and time. He appears well-developed and well-nourished. No distress.  HENT:  Head: Normocephalic and atraumatic.  Eyes: Conjunctivae and lids are normal. Right eye exhibits no discharge. Left eye exhibits no discharge. No scleral icterus.  Cardiovascular: Normal rate.  Pulmonary/Chest: Effort normal.  Abdominal: Normal appearance. There is no splenomegaly or hepatomegaly.  Musculoskeletal: Normal range of motion.  Neurological: He is alert and oriented to person, place, and time.  Skin: Skin is intact. No rash noted. No pallor.  Psychiatric: He has a normal mood and affect. His behavior is normal. Judgment and thought content normal.    Results for orders placed or performed in visit on 01/07/17  CoaguChek XS/INR [name]  Result Value Ref Range   INR 3.8 (H) 0.9 - 1.1   Prothrombin Time 45.6 sec    Lipid Panel w/o Chol/HDL Ratio  Result Value Ref Range   Cholesterol, Total 178 100 - 199 mg/dL   Triglycerides 154 (H) 0 - 149 mg/dL   HDL 48 >39 mg/dL   VLDL Cholesterol Cal 31 5 - 40 mg/dL   LDL Calculated 99 0 - 99 mg/dL  Comprehensive metabolic panel  Result Value Ref Range   Glucose 99 65 - 99 mg/dL   BUN 12 8 - 27 mg/dL   Creatinine, Ser 0.89 0.76 - 1.27 mg/dL   GFR calc non Af Amer 92 >59 mL/min/1.73   GFR calc Af Amer 107 >59 mL/min/1.73   BUN/Creatinine Ratio 13 10 - 24   Sodium 136 134 - 144 mmol/L   Potassium 4.9 3.5 - 5.2 mmol/L   Chloride 95 (L) 96 - 106 mmol/L   CO2 25 20 - 29 mmol/L   Calcium 9.8 8.6 - 10.2 mg/dL   Total Protein 7.5 6.0 - 8.5 g/dL   Albumin 5.1 (H) 3.6 - 4.8 g/dL   Globulin, Total 2.4 1.5 - 4.5 g/dL   Albumin/Globulin Ratio 2.1 1.2 - 2.2   Bilirubin Total 0.3 0.0 - 1.2 mg/dL   Alkaline Phosphatase 64 39 - 117 IU/L   AST 25 0 - 40 IU/L  ALT 34 0 - 44 IU/L  TSH  Result Value Ref Range   TSH 1.800 0.450 - 4.500 uIU/mL  CBC with Differential/Platelet  Result Value Ref Range   WBC 5.4 3.4 - 10.8 x10E3/uL   RBC 4.59 4.14 - 5.80 x10E6/uL   Hemoglobin 13.8 13.0 - 17.7 g/dL   Hematocrit 41.6 37.5 - 51.0 %   MCV 91 79 - 97 fL   MCH 30.1 26.6 - 33.0 pg   MCHC 33.2 31.5 - 35.7 g/dL   RDW 14.4 12.3 - 15.4 %   Platelets 304 150 - 379 x10E3/uL   Neutrophils 63 Not Estab. %   Lymphs 23 Not Estab. %   Monocytes 10 Not Estab. %   Eos 3 Not Estab. %   Basos 1 Not Estab. %   Neutrophils Absolute 3.3 1.4 - 7.0 x10E3/uL   Lymphocytes Absolute 1.2 0.7 - 3.1 x10E3/uL   Monocytes Absolute 0.6 0.1 - 0.9 x10E3/uL   EOS (ABSOLUTE) 0.2 0.0 - 0.4 x10E3/uL   Basophils Absolute 0.0 0.0 - 0.2 x10E3/uL   Immature Granulocytes 0 Not Estab. %   Immature Grans (Abs) 0.0 0.0 - 0.1 x10E3/uL      Assessment & Plan:   Problem List Items Addressed This Visit      Unprioritized   DVT (deep venous thrombosis) (HCC) - Primary    INR stable.  Continue present  doses      Relevant Orders   CoaguChek XS/INR [name]       Follow up plan: Return in about 4 weeks (around 02/18/2017).

## 2017-01-21 NOTE — Assessment & Plan Note (Signed)
INR stable.  Continue present doses

## 2017-01-22 ENCOUNTER — Other Ambulatory Visit: Payer: Self-pay | Admitting: Unknown Physician Specialty

## 2017-02-24 ENCOUNTER — Other Ambulatory Visit: Payer: Self-pay | Admitting: Unknown Physician Specialty

## 2017-02-24 ENCOUNTER — Other Ambulatory Visit: Payer: Self-pay

## 2017-02-24 MED ORDER — WARFARIN SODIUM 2 MG PO TABS: ORAL_TABLET | ORAL | 0 refills | Status: DC

## 2017-02-24 MED ORDER — HYDROCHLOROTHIAZIDE 25 MG PO TABS: 25.0000 mg | ORAL_TABLET | Freq: Every morning | ORAL | 0 refills | Status: DC

## 2017-02-24 MED ORDER — ATORVASTATIN CALCIUM 10 MG PO TABS: ORAL_TABLET | ORAL | 0 refills | Status: DC

## 2017-02-24 MED ORDER — LISINOPRIL 20 MG PO TABS: 20.0000 mg | ORAL_TABLET | Freq: Every day | ORAL | 0 refills | Status: DC

## 2017-03-04 ENCOUNTER — Ambulatory Visit: Payer: 59 | Admitting: Unknown Physician Specialty

## 2017-03-04 ENCOUNTER — Encounter: Payer: Self-pay | Admitting: Unknown Physician Specialty

## 2017-03-04 VITALS — BP 126/81 | HR 56 | Temp 98.2°F | Wt 204.1 lb

## 2017-03-04 DIAGNOSIS — I82519 Chronic embolism and thrombosis of unspecified femoral vein: Secondary | ICD-10-CM | POA: Diagnosis not present

## 2017-03-04 LAB — COAGUCHEK XS/INR WAIVED
INR: 3 — AB (ref 0.9–1.1)
PROTHROMBIN TIME: 35.4 s

## 2017-03-04 NOTE — Progress Notes (Signed)
   Coumadin   BP 126/81   Pulse (!) 56   Temp 98.2 F (36.8 C) (Oral)   Wt 204 lb 1.6 oz (92.6 kg)   SpO2 100%   BMI 28.47 kg/m    Subjective:    Patient ID: Dwayne Jacobson, male    DOB: [birth year], 62 y.o.   MRN: [id number]  CC: Coumadin management  HPI: This patient is a 63 y.o. male who presents for coumadin management. The expected duration of coumadin treatment is lilelongThe reason for anticoagulation is DVT  Present Coumadin dose:  Goal:2.0-3 Excessive bruising:no Nose bleeding: no Rectal bleeding: no Prolonged menstrual cycles: N/A Eating diet with consistent amounts of foods containing Vitamin K:yes Any recent antibiotic use? no   Lump on jaw Lump came up on jaw 2 weeks ago.  It is now smaller but hard.  No dental issues or tooth pain. Not a regular dental patient.   Relevant past medical, surgical, family and social history reviewed and updated as indicated. Interim medical history since our last visit reviewed. Allergies and medications reviewed and updated.  ROS: Per HPI unless specifically indicated above     Objective:    BP 126/81   Pulse (!) 56   Temp 98.2 F (36.8 C) (Oral)   Wt 204 lb 1.6 oz (92.6 kg)   SpO2 100%   BMI 28.47 kg/m   Wt Readings from Last 3 Encounters:  03/04/17 204 lb 1.6 oz (92.6 kg)  01/21/17 204 lb 12.8 oz (92.9 kg)  01/14/17 208 lb (94.3 kg)     General: Well appearing, well nourished in no distress.  Normal mood and affect. Skin: No excessive bruising or rash HEENT: Enlarged lymph node right jaw.  Soft and non-fixed  Last INR: 3.0    Last CBC:  Lab Results  Component Value Date   WBC 5.4 01/07/2017   HGB 13.8 01/07/2017   HCT 41.6 01/07/2017   MCV 91 01/07/2017   PLT 304 01/07/2017    Results for orders placed or performed in visit on 01/21/17  CoaguChek XS/INR [name]  Result Value Ref Range   INR 2.3 (H) 0.9 - 1.1   Prothrombin Time 27.8 sec       Assessment:     ICD-10-CM   1. 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  Recheck in 30 days  2.  Lymphadenitis of jaw Discussed following with dentist.  Pt willing to make own appt  Plan:   Discussed current plan face-to-face with patient.

## 2017-03-21 ENCOUNTER — Other Ambulatory Visit: Payer: Self-pay | Admitting: Unknown Physician Specialty

## 2017-03-22 ENCOUNTER — Telehealth: Payer: Self-pay | Admitting: Unknown Physician Specialty

## 2017-03-22 ENCOUNTER — Ambulatory Visit: Payer: Self-pay

## 2017-03-22 MED ORDER — ATORVASTATIN CALCIUM 10 MG PO TABS: ORAL_TABLET | ORAL | 3 refills | Status: DC

## 2017-03-22 MED ORDER — HYDROCHLOROTHIAZIDE 25 MG PO TABS: 25.0000 mg | ORAL_TABLET | Freq: Every morning | ORAL | 3 refills | Status: DC

## 2017-03-22 MED ORDER — LISINOPRIL 20 MG PO TABS: 20.0000 mg | ORAL_TABLET | Freq: Every day | ORAL | 3 refills | Status: DC

## 2017-03-22 MED ORDER — AMLODIPINE BESYLATE 5 MG PO TABS: 5.0000 mg | ORAL_TABLET | Freq: Every day | ORAL | 3 refills | Status: DC

## 2017-03-22 NOTE — Telephone Encounter (Signed)
Routing to provider. Cheryl please change RX to normal so it will go straight to the pharmacy.

## 2017-03-22 NOTE — Telephone Encounter (Signed)
According to chart, prescriptions were last written 02/24/17 for #30, no refills. Please change all 3 prescriptions to normal so they will go straight to the pharmacy please.

## 2017-03-22 NOTE — Telephone Encounter (Signed)
Records show I refilled them already.  Please let me know if I am mistaken.

## 2017-03-22 NOTE — Telephone Encounter (Signed)
Pt.'s wife called, concerned why pt.'s medication refills were denied yesterday - Lipitor, Lisinopril and Hydrodiuril. Please advise pt.'s wife. Call back number [phone].

## 2017-03-22 NOTE — Telephone Encounter (Signed)
pts wife called and would like to request a refill for warfarin sent to [facility]

## 2017-03-22 NOTE — Addendum Note (Signed)
Addended by: Kathrine Haddock on: 03/22/2017 03:16 PM   Modules accepted: Orders

## 2017-03-22 NOTE — Telephone Encounter (Signed)
Will rewrit

## 2017-03-22 NOTE — Telephone Encounter (Signed)
All prescriptions written as phone in so I called all 3 prescriptions into Dwayne Jacobson for the patient.

## 2017-03-23 ENCOUNTER — Telehealth: Payer: Self-pay | Admitting: Unknown Physician Specialty

## 2017-03-23 ENCOUNTER — Telehealth: Payer: Self-pay

## 2017-03-23 MED ORDER — WARFARIN SODIUM 2 MG PO TABS: ORAL_TABLET | ORAL | 12 refills | Status: DC

## 2017-03-23 MED ORDER — WARFARIN SODIUM 2 MG PO TABS: ORAL_TABLET | ORAL | 0 refills | Status: DC

## 2017-03-23 NOTE — Telephone Encounter (Signed)
Copied from [place]. Topic: Inquiry >> Mar 23, 2017  8:20 AM Pricilla Handler wrote: Reason for CRM: Patient's wife called very upset. Patient did not receive his refill of Warfarin (COUMADIN) 2 MG tablet on yesterday with the rest of his prescriptions. Please call patient's wife this morning ASAP at [phone]. Patient's wife want to know why has she had so much difficulty obtaining patient's prescriptions on yesterday and today. Patient's preferred pharmacy is [facility], [place] [phone] (Phone) [phone] (Fax).       Thank You!!!

## 2017-03-23 NOTE — Telephone Encounter (Signed)
Copied from [place]. Topic: Inquiry >> Mar 23, 2017  8:20 AM Pricilla Handler wrote: Reason for CRM: Patient's wife called very upset. Patient did not receive his refill of Warfarin (COUMADIN) 2 MG tablet on yesterday with the rest of his prescriptions. Please call patient's wife this morning ASAP at [phone]. Patient's wife want to know why has she had so much difficulty obtaining patient's prescriptions on yesterday and today. Patient's preferred pharmacy is [facility], [place] [phone] (Phone) [phone] (Fax).       Thank You!!!   Routing to provider for refill. Please change the RX to normal so it will go straight to the pharmacy please.

## 2017-03-23 NOTE — Telephone Encounter (Signed)
RX still says phone in so I called the pharmacy and added the 12 refills on to the prescription for the patient.

## 2017-03-23 NOTE — Telephone Encounter (Signed)
Pts wife, Adria Devon called back and would like to have warfarin sent in to [facility].

## 2017-03-23 NOTE — Telephone Encounter (Signed)
RX called into the pharmacy and patient's wife notified that this was done. Dwayne Jacobson, is there anyway we can send the RX into the pharmacy electronically with refills?

## 2017-03-23 NOTE — Telephone Encounter (Signed)
Pt wife  Called  This  Am  Stating   That  Patients  Rx for  Coumadin   Was  Not  Filled  Yet .   Spoke  With     Manuela Schwartz the  Pharmacist  At   [facility]   And   She  Has  Not  Received refill  -   Spoke   With [place] .  See  Telephone   Encounter  From  Yesterday  Patients  Wife  Notified  Of  Above .

## 2017-04-01 ENCOUNTER — Ambulatory Visit (INDEPENDENT_AMBULATORY_CARE_PROVIDER_SITE_OTHER): Payer: 59 | Admitting: Unknown Physician Specialty

## 2017-04-01 ENCOUNTER — Encounter: Payer: Self-pay | Admitting: Unknown Physician Specialty

## 2017-04-01 VITALS — BP 132/73 | HR 50 | Temp 97.9°F | Wt 206.6 lb

## 2017-04-01 DIAGNOSIS — I82519 Chronic embolism and thrombosis of unspecified femoral vein: Secondary | ICD-10-CM

## 2017-04-01 DIAGNOSIS — Z5181 Encounter for therapeutic drug level monitoring: Secondary | ICD-10-CM | POA: Insufficient documentation

## 2017-04-01 DIAGNOSIS — Z7901 Long term (current) use of anticoagulants: Secondary | ICD-10-CM

## 2017-04-01 LAB — COAGUCHEK XS/INR WAIVED
INR: 2.4 — AB (ref 0.9–1.1)
PROTHROMBIN TIME: 28.4 s

## 2017-04-01 MED ORDER — WARFARIN SODIUM 2 MG PO TABS: ORAL_TABLET | ORAL | 12 refills | Status: DC

## 2017-04-01 NOTE — Assessment & Plan Note (Signed)
No signs of DVT

## 2017-04-01 NOTE — Assessment & Plan Note (Signed)
INR 2.4 and at goal

## 2017-04-01 NOTE — Progress Notes (Signed)
   BP 132/73   Pulse (!) 50   Temp 97.9 F (36.6 C) (Oral)   Wt 206 lb 9.6 oz (93.7 kg)   SpO2 99%   BMI 28.81 kg/m    Subjective:    Patient ID: Dwayne Jacobson, male    DOB: May 02, 1955, 62 y.o.   MRN: [id number]  HPI: Dwayne Jacobson is a 62 y.o. male  Chief Complaint  Patient presents with  . DVT    PT/ INR- pt taking 1 mg of warfarin 5 days per week and 2 mg 2 days per week    CC: Coumadin management  HPI: This patient is a 62 y.o. male who presents for coumadin management for his DVT. Expected to be on treatment lifelong.    Present Coumadin dose: 1 mg of warfarin 5 days/week and 2 mg 2 days/week  Goal:2.0-3 Excessive bruising:no Nose bleeding:no Rectal bleeding:no Blood in urine: No Eating diet with consistent amounts of foods containing Vitamin K:yes Any recent antibiotic use?no    Relevant past medical, surgical, family and social history reviewed and updated as indicated. Interim medical history since our last visit reviewed. Allergies and medications reviewed and updated.  Review of Systems  Constitutional: Negative.   Gastrointestinal: Negative.   Genitourinary: Negative.   Skin: Negative.     Per HPI unless specifically indicated above     Objective:    BP 132/73   Pulse (!) 50   Temp 97.9 F (36.6 C) (Oral)   Wt 206 lb 9.6 oz (93.7 kg)   SpO2 99%   BMI 28.81 kg/m   Wt Readings from Last 3 Encounters:  04/01/17 206 lb 9.6 oz (93.7 kg)  03/04/17 204 lb 1.6 oz (92.6 kg)  01/21/17 204 lb 12.8 oz (92.9 kg)    Physical Exam  Results for orders placed or performed in visit on 03/04/17  CoaguChek XS/INR [name]  Result Value Ref Range   INR 3.0 (H) 0.9 - 1.1   Prothrombin Time 35.4 sec      Assessment & Plan:   Problem List Items Addressed This Visit      Unprioritized   DVT (deep venous thrombosis) ([place]) - Primary    No signs of DVT      Relevant Medications   warfarin (COUMADIN) 2 MG tablet   Other Relevant Orders   CoaguChek  XS/INR [name]   Encounter for monitoring Coumadin therapy    INR 2.4 and at goal          Follow up plan: Return in about 4 weeks (around 04/29/2017).

## 2017-05-06 ENCOUNTER — Encounter: Payer: Self-pay | Admitting: Unknown Physician Specialty

## 2017-05-06 ENCOUNTER — Ambulatory Visit (INDEPENDENT_AMBULATORY_CARE_PROVIDER_SITE_OTHER): Payer: 59 | Admitting: Unknown Physician Specialty

## 2017-05-06 VITALS — BP 145/75 | HR 62 | Temp 97.6°F | Wt 208.8 lb

## 2017-05-06 DIAGNOSIS — Z7901 Long term (current) use of anticoagulants: Secondary | ICD-10-CM | POA: Diagnosis not present

## 2017-05-06 DIAGNOSIS — I82419 Acute embolism and thrombosis of unspecified femoral vein: Secondary | ICD-10-CM

## 2017-05-06 DIAGNOSIS — Z5181 Encounter for therapeutic drug level monitoring: Secondary | ICD-10-CM

## 2017-05-06 LAB — COAGUCHEK XS/INR WAIVED
INR: 3 — AB (ref 0.9–1.1)
PROTHROMBIN TIME: 36.3 s

## 2017-05-06 NOTE — Assessment & Plan Note (Addendum)
INR is 3.  Taking 1 mg daily but 2 mg on Monday and Thursday.  Change to 1 mg on Mondays and 2 mg just one day/week.  Recheck 2 weeks

## 2017-05-06 NOTE — Progress Notes (Signed)
   BP (!) 145/75   Pulse 62   Temp 97.6 F (36.4 C) (Oral)   Wt 208 lb 12.8 oz (94.7 kg)   SpO2 97%   BMI 29.12 kg/m    Subjective:    Patient ID: Dwayne Jacobson, male    DOB: [birth year], 62 y.o.   MRN: [id number]  HPI: Dwayne Jacobson is a 62 y.o. male  Chief Complaint  Patient presents with  . DVT    PT/INR- pt taking 1 mg 5 days per week and 2 mg 2 days per week    HPI: This patient is a73 y.o.malewho presents for coumadin management due to DVT. Expected to be on treatment lifelong.   Present Coumadin dose: 1 mg of warfarin 5 days/week and 2 mg 2 days/week  Goal:2.0-3 Excessive bruising:none Nose bleeding:none Rectal bleeding:none Blood in urine: None Eating diet with consistent amounts of foods containing Vitamin K: recent Spinach no recent antibiotics   Relevant past medical, surgical, family and social history reviewed and updated as indicated. Interim medical history since our last visit reviewed. Allergies and medications reviewed and updated.  Review of Systems  Per HPI unless specifically indicated above     Objective:    BP (!) 145/75   Pulse 62   Temp 97.6 F (36.4 C) (Oral)   Wt 208 lb 12.8 oz (94.7 kg)   SpO2 97%   BMI 29.12 kg/m   Wt Readings from Last 3 Encounters:  05/06/17 208 lb 12.8 oz (94.7 kg)  04/01/17 206 lb 9.6 oz (93.7 kg)  03/04/17 204 lb 1.6 oz (92.6 kg)    Physical Exam  Constitutional: He is oriented to person, place, and time. He appears well-developed and well-nourished. No distress.  HENT:  Head: Normocephalic and atraumatic.  Eyes: Conjunctivae and lids are normal. Right eye exhibits no discharge. Left eye exhibits no discharge. No scleral icterus.  Cardiovascular: Normal rate.  Pulmonary/Chest: Effort normal.  Abdominal: Normal appearance. There is no splenomegaly or hepatomegaly.  Musculoskeletal: Normal range of motion.  Neurological: He is alert and oriented to person, place, and time.  Skin: Skin is intact.  No rash noted. No pallor.  Psychiatric: He has a normal mood and affect. His behavior is normal. Judgment and thought content normal.    Results for orders placed or performed in visit on 04/01/17  CoaguChek XS/INR [name]  Result Value Ref Range   INR 2.4 (H) 0.9 - 1.1   Prothrombin Time 28.4 sec      Assessment & Plan:   Problem List Items Addressed This Visit      Unprioritized   DVT (deep venous thrombosis) ([place]) - Primary   Relevant Orders   CoaguChek XS/INR [name]   Encounter for monitoring Coumadin therapy    INR is 3.  Taking 1 mg daily but 2 mg on Monday and Thursday.  Change to 1 mg on Mondays and 2 mg just one day/week.  Recheck 2 weeks          Follow up plan: Return in about 2 weeks (around 05/20/2017).

## 2017-05-20 ENCOUNTER — Ambulatory Visit (INDEPENDENT_AMBULATORY_CARE_PROVIDER_SITE_OTHER): Payer: 59 | Admitting: Unknown Physician Specialty

## 2017-05-20 ENCOUNTER — Encounter: Payer: Self-pay | Admitting: Unknown Physician Specialty

## 2017-05-20 VITALS — BP 136/80 | HR 56 | Temp 98.4°F | Ht 71.0 in | Wt 207.5 lb

## 2017-05-20 DIAGNOSIS — Z5181 Encounter for therapeutic drug level monitoring: Secondary | ICD-10-CM | POA: Diagnosis not present

## 2017-05-20 DIAGNOSIS — Z7901 Long term (current) use of anticoagulants: Secondary | ICD-10-CM | POA: Diagnosis not present

## 2017-05-20 DIAGNOSIS — I82419 Acute embolism and thrombosis of unspecified femoral vein: Secondary | ICD-10-CM

## 2017-05-20 LAB — COAGUCHEK XS/INR WAIVED
INR: 2.2 — ABNORMAL HIGH (ref 0.9–1.1)
Prothrombin Time: 25.9 s

## 2017-05-20 NOTE — Assessment & Plan Note (Signed)
INR is 2.2.  Recheck in 4 weeks and continue current dose.  Consider other oral anti-coagulants.  Discussed risk benefit with pt

## 2017-05-20 NOTE — Progress Notes (Signed)
   BP 136/80   Pulse (!) 56   Temp 98.4 F (36.9 C) (Oral)   Ht 5\' 11"  (1.803 m)   Wt 207 lb 8 oz (94.1 kg)   SpO2 96%   BMI 28.94 kg/m    Subjective:    Patient ID: Dwayne Jacobson, male    DOB: February 12, 1955, 62 y.o.   MRN: [id number]  HPI: Dwayne Jacobson is a 62 y.o. male  Chief Complaint  Patient presents with  . DVT    PT/ INR- pt taking 2 mg 1 day per week, and 1 mg 6 days per week   HPI: This patient is a62 y.o.malewho presents for coumadin managementdue to DVT.  He is expected to need life-long therapy Present Coumadin dose:1 mg of warfarin 5 days/week and 2 mg 2 days/week  Goal:2.0-3 No bruising No nose bleeds No rectal bleeding No blood in uring Eating diet with consistent amounts of foods containing Vitamin K: yes no recent antibiotics  Relevant past medical, surgical, family and social history reviewed and updated as indicated. Interim medical history since our last visit reviewed. Allergies and medications reviewed and updated.  Review of Systems  Per HPI unless specifically indicated above     Objective:    BP 136/80   Pulse (!) 56   Temp 98.4 F (36.9 C) (Oral)   Ht 5\' 11"  (1.803 m)   Wt 207 lb 8 oz (94.1 kg)   SpO2 96%   BMI 28.94 kg/m   Wt Readings from Last 3 Encounters:  05/20/17 207 lb 8 oz (94.1 kg)  05/06/17 208 lb 12.8 oz (94.7 kg)  04/01/17 206 lb 9.6 oz (93.7 kg)    Physical Exam  Constitutional: He is oriented to person, place, and time. He appears well-developed and well-nourished. No distress.  HENT:  Head: Normocephalic and atraumatic.  Eyes: Conjunctivae and lids are normal. Right eye exhibits no discharge. Left eye exhibits no discharge. No scleral icterus.  Neck: Normal range of motion. Neck supple. No JVD present. Carotid bruit is not present.  Cardiovascular: Normal rate, regular rhythm and normal heart sounds.  Pulmonary/Chest: Effort normal and breath sounds normal. No respiratory distress.  Abdominal: Normal  appearance. There is no splenomegaly or hepatomegaly.  Musculoskeletal: Normal range of motion.  Neurological: He is alert and oriented to person, place, and time.  Skin: Skin is warm, dry and intact. No rash noted. No pallor.  Psychiatric: He has a normal mood and affect. His behavior is normal. Judgment and thought content normal.    Results for orders placed or performed in visit on 05/06/17  CoaguChek XS/INR [name]  Result Value Ref Range   INR 3.0 (H) 0.9 - 1.1   Prothrombin Time 36.3 sec      Assessment & Plan:   Problem List Items Addressed This Visit      Unprioritized   DVT (deep venous thrombosis) ([place]) - Primary   Relevant Orders   CoaguChek XS/INR [name]   Encounter for monitoring Coumadin therapy    INR is 2.2.  Recheck in 4 weeks and continue current dose.  Consider other oral anti-coagulants.  Discussed risk benefit with pt          Follow up plan: Return in about 1 month (around 06/17/2017).

## 2017-06-17 NOTE — Progress Notes (Deleted)
Office Visit Note  Patient: Dwayne Jacobson             Date of Birth: Aug 14, 1955           MRN: [id number]             PCP: Kathrine Haddock, NP Referring: Kathrine Haddock, NP Visit Date: 07/01/2017 Occupation: @GUAROCC @    Subjective:  No chief complaint on file.   History of Present Illness: Dwayne Jacobson is a 62 y.o. male ***   Activities of Daily Living:  Patient reports morning stiffness for *** {minute/hour:19697}.   Patient {ACTIONS;DENIES/REPORTS:21021675::"Denies"} nocturnal pain.  Difficulty dressing/grooming: {ACTIONS;DENIES/REPORTS:21021675::"Denies"} Difficulty climbing stairs: {ACTIONS;DENIES/REPORTS:21021675::"Denies"} Difficulty getting out of chair: {ACTIONS;DENIES/REPORTS:21021675::"Denies"} Difficulty using hands for taps, buttons, cutlery, and/or writing: {ACTIONS;DENIES/REPORTS:21021675::"Denies"}   No Rheumatology ROS completed.   PMFS History:  Patient Active Problem List   Diagnosis Date Noted  . Encounter for monitoring Coumadin therapy 04/01/2017  . Chondrocalcinosis 01/13/2017  . Primary osteoarthritis of both knees 01/13/2017  . Therapeutic drug monitoring 04/11/2015  . DVT (deep venous thrombosis) ([place])   . Hypertension   . Hyperlipidemia   . SLE (systemic lupus erythematosus) ([place]) 01/18/2014    Past Medical History:  Diagnosis Date  . DVT (deep venous thrombosis) ([place])   . Hyperlipidemia   . Hypertension   . Seizures ([place])     Family History  Problem Relation Age of Onset  . Diabetes Mother   . Hypertension Mother   . Hyperlipidemia Mother   . Stroke Mother   . Lung disease Mother   . Heart disease Mother   . Heart disease Father   . Lung disease Father   . Diabetes Sister    No past surgical history on file. Social History   Social History Narrative  . Not on file     Objective: Vital Signs: There were no vitals taken for this visit.   Physical Exam   Musculoskeletal Exam: ***  CDAI Exam: No CDAI exam completed.     Investigation: No additional findings.PLQ eye exam: 10/29/2016  CBC Latest Ref Rng & Units 01/07/2017 12/10/2016 08/06/2016  WBC 3.4 - 10.8 x10E3/uL 5.4 5.7 6.2  Hemoglobin 13.0 - 17.7 g/dL 13.8 14.4 14.8  Hematocrit 37.5 - 51.0 % 41.6 42.0 45.2  Platelets 150 - 379 x10E3/uL 304 335 296   CMP Latest Ref Rng & Units 01/07/2017 12/10/2016 08/06/2016  Glucose 65 - 99 mg/dL 99 96 95  BUN 8 - 27 mg/dL 12 10 17   Creatinine 0.76 - 1.27 mg/dL 0.89 0.85 1.02  Sodium 134 - 144 mmol/L 136 136 138  Potassium 3.5 - 5.2 mmol/L 4.9 4.1 5.0  Chloride 96 - 106 mmol/L 95(L) 98 94(L)  CO2 20 - 29 mmol/L 25 27 24   Calcium 8.6 - 10.2 mg/dL 9.8 9.9 10.5(H)  Total Protein 6.0 - 8.5 g/dL 7.5 8.1 7.8  Total Bilirubin 0.0 - 1.2 mg/dL 0.3 0.3 0.3  Alkaline Phos 39 - 117 IU/L 64 - 71  AST 0 - 40 IU/L 25 23 34  ALT 0 - 44 IU/L 34 26 35    Imaging: No results found.  Speciality Comments: PLQ Eye Exam: 10/29/16 WNL @ [place]    Procedures:  No procedures performed Allergies: Patient has no known allergies.   Assessment / Plan:     Visit Diagnoses: No diagnosis found.    Orders: No orders of the defined types were placed in this encounter.  No orders of the defined types were  placed in this encounter.   Face-to-face time spent with patient was *** minutes. 50% of time was spent in counseling and coordination of care.  Follow-Up Instructions: No follow-ups on file.   Earnestine Mealing, CMA  Note - This record has been created using Editor, commissioning.  Chart creation errors have been sought, but may not always  have been located. Such creation errors do not reflect on  the standard of medical care.

## 2017-06-24 ENCOUNTER — Encounter: Payer: Self-pay | Admitting: Unknown Physician Specialty

## 2017-06-24 ENCOUNTER — Ambulatory Visit (INDEPENDENT_AMBULATORY_CARE_PROVIDER_SITE_OTHER): Payer: 59 | Admitting: Unknown Physician Specialty

## 2017-06-24 VITALS — BP 137/80 | HR 55 | Temp 97.8°F | Ht 71.0 in | Wt 204.0 lb

## 2017-06-24 DIAGNOSIS — I82419 Acute embolism and thrombosis of unspecified femoral vein: Secondary | ICD-10-CM | POA: Diagnosis not present

## 2017-06-24 LAB — COAGUCHEK XS/INR WAIVED
INR: 2.6 — ABNORMAL HIGH (ref 0.9–1.1)
Prothrombin Time: 31.2 s

## 2017-06-24 NOTE — Assessment & Plan Note (Signed)
INR is good at 2.6.  No changes necessary.

## 2017-06-24 NOTE — Progress Notes (Signed)
   BP 137/80   Pulse (!) 55   Temp 97.8 F (36.6 C) (Oral)   Ht 5\' 11"  (1.803 m)   Wt 204 lb (92.5 kg)   SpO2 98%   BMI 28.45 kg/m    Subjective:    Patient ID: Dwayne Jacobson, male    DOB: 06-14-1955, 62 y.o.   MRN: [id number]  HPI: Dwayne Jacobson is a 61 y.o. male  Chief Complaint  Patient presents with  . DVT    pt states he is currently taking 1 mg 6 days per week and 2 mg 1 day per week    HPI:  Pt presents for coumadin management for his DVT. Expected  Treatment is  lifelong.   Coumadin dose: 1 mg of warfarin 5 days/week and 2 mg 2 days/week  Goal:2.0-3 Excessive bruising:none Nose bleeding:none Rectal bleeding:none Blood in urine: None Eating diet with consistent amounts of foods containing Vitamin K: Usually but doing will lately Any recent antibiotic use?no  Relevant past medical, surgical, family and social history reviewed and updated as indicated. Interim medical history since our last visit reviewed. Allergies and medications reviewed and updated.  Review of Systems  Per HPI unless specifically indicated above     Objective:    BP 137/80   Pulse (!) 55   Temp 97.8 F (36.6 C) (Oral)   Ht 5\' 11"  (1.803 m)   Wt 204 lb (92.5 kg)   SpO2 98%   BMI 28.45 kg/m   Wt Readings from Last 3 Encounters:  06/24/17 204 lb (92.5 kg)  05/20/17 207 lb 8 oz (94.1 kg)  05/06/17 208 lb 12.8 oz (94.7 kg)    Physical Exam  Constitutional: He is oriented to person, place, and time. He appears well-developed and well-nourished. No distress.  HENT:  Head: Normocephalic and atraumatic.  Eyes: Conjunctivae and lids are normal. Right eye exhibits no discharge. Left eye exhibits no discharge. No scleral icterus.  Cardiovascular: Normal rate.  Pulmonary/Chest: Effort normal.  Abdominal: Normal appearance. There is no splenomegaly or hepatomegaly.  Musculoskeletal: Normal range of motion.  Neurological: He is alert and oriented to person, place, and time.  Skin:  Skin is intact. No rash noted. No pallor.  Psychiatric: He has a normal mood and affect. His behavior is normal. Judgment and thought content normal.    Results for orders placed or performed in visit on 05/20/17  CoaguChek XS/INR [name]  Result Value Ref Range   INR 2.2 (H) 0.9 - 1.1   Prothrombin Time 25.9 sec      Assessment & Plan:   Problem List Items Addressed This Visit      Unprioritized   DVT (deep venous thrombosis) ([place]) - Primary    INR is good at 2.6.  No changes necessary.        Relevant Orders   CoaguChek XS/INR [name]       Follow up plan: Return in about 5 weeks (around 07/29/2017) for physical.

## 2017-06-29 NOTE — Progress Notes (Signed)
Office Visit Note  Patient: Dwayne Jacobson             Date of Birth: 13-Nov-1955           MRN: [id number]             PCP: Kathrine Haddock, NP Referring: Kathrine Haddock, NP Visit Date: 07/08/2017 Occupation: @GUAROCC @    Subjective:  Eye redness and fatigue   History of Present Illness: Dwayne Jacobson is a 62 y.o. male with history of systemic lupus erythematosus and osteoarthritis.  Patient is on Plaquenil 200 mg by mouth twice daily.  Patient states that he has been having more frequent sores in his nose as well as worsening fatigue for the past 1 month.  He reports that the sores can be painful and he is noticed morning which seems to help.  He states that he is also been having eye dryness and eye redness in his left eye for about a month.  He states he experiences burning sensation in his left eye at times.  He denies any recent rashes or photosensitivity.  He denies any symptoms of raynaud's.  He denies any sores in his mouth.  He has very intermittent discomfort and mild swelling in his knees at times.  He denies any other joint pain or joint swelling at this time.  Activities of Daily Living:  Patient reports morning stiffness for 5 minutes.   Patient Denies nocturnal pain.  Difficulty dressing/grooming: Denies Difficulty climbing stairs: Denies Difficulty getting out of chair: Denies Difficulty using hands for taps, buttons, cutlery, and/or writing: Denies   Review of Systems  Constitutional: Positive for fatigue. Negative for night sweats.  HENT: Negative for mouth sores, trouble swallowing, trouble swallowing, mouth dryness and nose dryness.   Eyes: Positive for dryness. Negative for redness and visual disturbance.  Respiratory: Negative for cough, hemoptysis, shortness of breath and difficulty breathing.   Cardiovascular: Negative for chest pain, palpitations, hypertension, irregular heartbeat and swelling in legs/feet.  Gastrointestinal: Negative for abdominal pain,  constipation and diarrhea.  Endocrine: Negative for increased urination.  Genitourinary: Negative for painful urination and pelvic pain.  Musculoskeletal: Positive for arthralgias, joint pain, joint swelling and morning stiffness. Negative for myalgias, muscle weakness, muscle tenderness and myalgias.  Skin: Negative for color change, rash, hair loss, nodules/bumps, skin tightness, ulcers and sensitivity to sunlight.  Allergic/Immunologic: Negative for susceptible to infections.  Neurological: Negative for dizziness, fainting, light-headedness, headaches, memory loss, night sweats and weakness.  Hematological: Negative for swollen glands.  Psychiatric/Behavioral: Negative for depressed mood, confusion and sleep disturbance. The patient is not nervous/anxious.     PMFS History:  Patient Active Problem List   Diagnosis Date Noted  . Encounter for monitoring Coumadin therapy 04/01/2017  . Chondrocalcinosis 01/13/2017  . Primary osteoarthritis of both knees 01/13/2017  . Therapeutic drug monitoring 04/11/2015  . DVT (deep venous thrombosis) ([place])   . Hypertension   . Hyperlipidemia   . SLE (systemic lupus erythematosus) ([place]) 01/18/2014    Past Medical History:  Diagnosis Date  . DVT (deep venous thrombosis) ([place])   . Hyperlipidemia   . Hypertension   . Seizures ([place])     Family History  Problem Relation Age of Onset  . Diabetes Mother   . Hypertension Mother   . Hyperlipidemia Mother   . Stroke Mother   . Lung disease Mother   . Heart disease Mother   . Heart disease Father   . Lung disease Father   .  Diabetes Sister    History reviewed. No pertinent surgical history. Social History   Social History Narrative  . Not on file     Objective: Vital Signs: BP 115/69 (BP Location: Left Arm, Patient Position: Sitting, Cuff Size: Normal)   Pulse (!) 53   Resp 15   Ht 5\' 11"  (1.803 m)   Wt 202 lb (91.6 kg)   BMI 28.17 kg/m    Physical Exam  Constitutional: He is  oriented to person, place, and time. He appears well-developed and well-nourished.  HENT:  Head: Normocephalic and atraumatic.  Nasal ulceration.  No oral ulcerations.   Eyes: Pupils are equal, round, and reactive to light. EOM are normal. Left eye exhibits discharge (and erythema).  Neck: Normal range of motion. Neck supple.  Cardiovascular: Normal rate, regular rhythm and normal heart sounds.  Pulmonary/Chest: Effort normal and breath sounds normal.  Abdominal: Soft. Bowel sounds are normal.  Lymphadenopathy:    He has no cervical adenopathy.  Neurological: He is alert and oriented to person, place, and time.  Skin: Skin is warm and dry. Capillary refill takes less than 2 seconds.  No malar rash noted. No digital ulcerations.   Psychiatric: He has a normal mood and affect. His behavior is normal.  Nursing note and vitals reviewed.    Musculoskeletal Exam: C-spine good ROM.  Thoracic and lumbar spine limited ROM.  No Midline spinal tenderness.  No SI joint tenderness.  Shoulder joints, elbow joints, wrist joints, MCPs, PIPs, and DIPs good ROM with no synovitis.  He has PIP and DIP synovial thickening consistent with osteoarthritis.  Hip joints, knee joints, ankle joints good ROM.  No warmth or effusion of knee joints.  Left knee crepitus.  No tenderness of trochanteric bursa bilaterally.   CDAI Exam: No CDAI exam completed.    Investigation: No additional findings. CBC Latest Ref Rng & Units 01/07/2017 12/10/2016 08/06/2016  WBC 3.4 - 10.8 x10E3/uL 5.4 5.7 6.2  Hemoglobin 13.0 - 17.7 g/dL 13.8 14.4 14.8  Hematocrit 37.5 - 51.0 % 41.6 42.0 45.2  Platelets 150 - 379 x10E3/uL 304 335 296   CMP Latest Ref Rng & Units 01/07/2017 12/10/2016 08/06/2016  Glucose 65 - 99 mg/dL 99 96 95  BUN 8 - 27 mg/dL 12 10 17   Creatinine 0.76 - 1.27 mg/dL 0.89 0.85 1.02  Sodium 134 - 144 mmol/L 136 136 138  Potassium 3.5 - 5.2 mmol/L 4.9 4.1 5.0  Chloride 96 - 106 mmol/L 95(L) 98 94(L)  CO2 20 - 29 mmol/L  25 27 24   Calcium 8.6 - 10.2 mg/dL 9.8 9.9 10.5(H)  Total Protein 6.0 - 8.5 g/dL 7.5 8.1 7.8  Total Bilirubin 0.0 - 1.2 mg/dL 0.3 0.3 0.3  Alkaline Phos 39 - 117 IU/L 64 - 71  AST 0 - 40 IU/L 25 23 34  ALT 0 - 44 IU/L 34 26 35     Imaging: No results found.  Speciality Comments: PLQ Eye Exam: 10/29/16 WNL @ [place]    Procedures:  No procedures performed Allergies: Patient has no known allergies.    PLQ Eye Exam: 10/29/16 WNL @ [place] / Plan:     Visit Diagnoses: Other systemic lupus erythematosus with other organ involvement ([place]) - Positive dsDNA, positive Ro. History of rash, diagnosed with lupus in the past. He has been on Plaquenil for the last 3 years: He has not had any recent lupus flares.  He has been experiencing worsening fatigue over  the past 1 month.  He has also had recurrent painful nasal ulcerations, so he has been applying neosporin to alleviate some of the discomfort.  He has not had any recent rashes.  No malar rash noted.  No symptoms of Raynaud's or signs of gangrene noted.  He has left eye dryness, erythema, and intermittent discharge of the left eye for the past 1 month.  He was advised to follow up with his ophthalmologist.  He will continue taking Plaquenil 200 mg 1 tablet by mouth daily.  His last Plaquenil eye exam was performed in September 2018.  We will check autoimmune labs today since she is been having worsening fatigue and nasal ulcerations.  He will continue on his current treatment regimen.  He does not need any refills of Plaquenil today.- Plan: CBC with Differential/Platelet, COMPLETE METABOLIC PANEL WITH GFR, Urinalysis, Routine w reflex microscopic, C3 and C4, Anti-DNA antibody, double-stranded, Sedimentation rate, ANA, VITAMIN D 25 Hydroxy (Vit-D Deficiency, Fractures)  High risk medication use - Plaquenil 200 mg 1 tablet by mouth once day. CBC and CMP were ordered today to monitor toxicity. - Plan: CBC with  Differential/Platelet, COMPLETE METABOLIC PANEL WITH GFR  Primary osteoarthritis of both knees: No warmth or effusion of knee joints.  Left knee crepitus.  He has occasional discomfort and mild swelling in his knee joints.  He has good ROM on exam.   Chondrocalcinosis  Other medical conditions are listed as follows:   Essential hypertension  Mixed hyperlipidemia  History of DVT (deep vein thrombosis)    Orders: Orders Placed This Encounter  Procedures  . CBC with Differential/Platelet  . COMPLETE METABOLIC PANEL WITH GFR  . Urinalysis, Routine w reflex microscopic  . C3 and C4  . Anti-DNA antibody, double-stranded  . Sedimentation rate  . ANA  . VITAMIN D 25 Hydroxy (Vit-D Deficiency, Fractures)   No orders of the defined types were placed in this encounter.   Face-to-face time spent with patient was 30 minutes. 50% of time was spent in counseling and coordination of care.  Follow-Up Instructions: Return for Systemic lupus erythematosus, Osteoarthritis.   Ofilia Neas, PA-C  Note - This record has been created using Dragon software.  Chart creation errors have been sought, but may not always  have been located. Such creation errors do not reflect on  the standard of medical care.

## 2017-07-01 ENCOUNTER — Ambulatory Visit: Payer: 59 | Admitting: Rheumatology

## 2017-07-08 ENCOUNTER — Ambulatory Visit: Payer: 59 | Admitting: Physician Assistant

## 2017-07-08 ENCOUNTER — Encounter: Payer: Self-pay | Admitting: Physician Assistant

## 2017-07-08 VITALS — BP 115/69 | HR 53 | Resp 15 | Ht 71.0 in | Wt 202.0 lb

## 2017-07-08 DIAGNOSIS — M170 Bilateral primary osteoarthritis of knee: Secondary | ICD-10-CM

## 2017-07-08 DIAGNOSIS — Z86718 Personal history of other venous thrombosis and embolism: Secondary | ICD-10-CM

## 2017-07-08 DIAGNOSIS — M3219 Other organ or system involvement in systemic lupus erythematosus: Secondary | ICD-10-CM | POA: Diagnosis not present

## 2017-07-08 DIAGNOSIS — M1120 Other chondrocalcinosis, unspecified site: Secondary | ICD-10-CM | POA: Diagnosis not present

## 2017-07-08 DIAGNOSIS — E782 Mixed hyperlipidemia: Secondary | ICD-10-CM | POA: Diagnosis not present

## 2017-07-08 DIAGNOSIS — I10 Essential (primary) hypertension: Secondary | ICD-10-CM

## 2017-07-08 DIAGNOSIS — Z79899 Other long term (current) drug therapy: Secondary | ICD-10-CM | POA: Diagnosis not present

## 2017-07-12 LAB — CBC WITH DIFFERENTIAL/PLATELET
BASOS PCT: 0.6 %
Basophils Absolute: 28 cells/uL (ref 0–200)
EOS ABS: 202 {cells}/uL (ref 15–500)
Eosinophils Relative: 4.3 %
HCT: 43.8 % (ref 38.5–50.0)
Hemoglobin: 15.1 g/dL (ref 13.2–17.1)
Lymphs Abs: 1100 cells/uL (ref 850–3900)
MCH: 30.6 pg (ref 27.0–33.0)
MCHC: 34.5 g/dL (ref 32.0–36.0)
MCV: 88.7 fL (ref 80.0–100.0)
MONOS PCT: 10.5 %
MPV: 9.8 fL (ref 7.5–12.5)
Neutro Abs: 2876 cells/uL (ref 1500–7800)
Neutrophils Relative %: 61.2 %
PLATELETS: 286 10*3/uL (ref 140–400)
RBC: 4.94 10*6/uL (ref 4.20–5.80)
RDW: 13 % (ref 11.0–15.0)
TOTAL LYMPHOCYTE: 23.4 %
WBC mixed population: 494 cells/uL (ref 200–950)
WBC: 4.7 10*3/uL (ref 3.8–10.8)

## 2017-07-12 LAB — COMPLETE METABOLIC PANEL WITH GFR
AG RATIO: 1.6 (calc) (ref 1.0–2.5)
ALT: 31 U/L (ref 9–46)
AST: 26 U/L (ref 10–35)
Albumin: 5 g/dL (ref 3.6–5.1)
Alkaline phosphatase (APISO): 62 U/L (ref 40–115)
BUN: 11 mg/dL (ref 7–25)
CALCIUM: 10.1 mg/dL (ref 8.6–10.3)
CO2: 28 mmol/L (ref 20–32)
Chloride: 97 mmol/L — ABNORMAL LOW (ref 98–110)
Creat: 0.92 mg/dL (ref 0.70–1.25)
GFR, EST NON AFRICAN AMERICAN: 89 mL/min/{1.73_m2} (ref >=60)
GFR, Est African American: 103 mL/min/{1.73_m2} (ref >=60)
Globulin: 3.1 g/dL (calc) (ref 1.9–3.7)
Glucose, Bld: 93 mg/dL (ref 65–99)
POTASSIUM: 4.3 mmol/L (ref 3.5–5.3)
Sodium: 134 mmol/L — ABNORMAL LOW (ref 135–146)
Total Bilirubin: 0.3 mg/dL (ref 0.2–1.2)
Total Protein: 8.1 g/dL (ref 6.1–8.1)

## 2017-07-12 LAB — URINALYSIS, ROUTINE W REFLEX MICROSCOPIC
Bilirubin Urine: NEGATIVE
Glucose, UA: NEGATIVE
Hgb urine dipstick: NEGATIVE
KETONES UR: NEGATIVE
Leukocytes, UA: NEGATIVE
NITRITE: NEGATIVE
PH: 5.5 (ref 5.0–8.0)
Protein, ur: NEGATIVE
SPECIFIC GRAVITY, URINE: 1.011 (ref 1.001–1.03)

## 2017-07-12 LAB — ANA: ANA: NEGATIVE

## 2017-07-12 LAB — ANTI-DNA ANTIBODY, DOUBLE-STRANDED: DS DNA AB: 6 [IU]/mL — AB

## 2017-07-12 LAB — SEDIMENTATION RATE: SED RATE: 6 mm/h (ref 0–20)

## 2017-07-12 LAB — C3 AND C4
C3 COMPLEMENT: 145 mg/dL (ref 82–185)
C4 COMPLEMENT: 32 mg/dL (ref 15–53)

## 2017-07-12 LAB — VITAMIN D 25 HYDROXY (VIT D DEFICIENCY, FRACTURES): VIT D 25 HYDROXY: 34 ng/mL (ref 30–100)

## 2017-07-21 ENCOUNTER — Other Ambulatory Visit: Payer: Self-pay | Admitting: Unknown Physician Specialty

## 2017-07-25 ENCOUNTER — Other Ambulatory Visit: Payer: Self-pay

## 2017-07-25 MED ORDER — WARFARIN SODIUM 2 MG PO TABS: ORAL_TABLET | ORAL | 12 refills | Status: DC

## 2017-07-25 NOTE — Telephone Encounter (Signed)
Warfarin refill is pending per CMA in office

## 2017-07-27 NOTE — Telephone Encounter (Signed)
Warfarin Rx processed on 07/25/17

## 2017-08-19 ENCOUNTER — Encounter: Payer: Self-pay | Admitting: Unknown Physician Specialty

## 2017-08-19 ENCOUNTER — Encounter: Payer: 59 | Admitting: Unknown Physician Specialty

## 2017-08-26 ENCOUNTER — Ambulatory Visit: Payer: 59 | Admitting: Family Medicine

## 2017-08-26 ENCOUNTER — Encounter: Payer: Self-pay | Admitting: Family Medicine

## 2017-08-26 VITALS — BP 150/80 | HR 59 | Temp 98.3°F | Ht 71.0 in | Wt 201.6 lb

## 2017-08-26 DIAGNOSIS — I82419 Acute embolism and thrombosis of unspecified femoral vein: Secondary | ICD-10-CM

## 2017-08-26 NOTE — Progress Notes (Signed)
   BP (!) 150/80   Pulse (!) 59   Temp 98.3 F (36.8 C) (Oral)   Ht 5\' 11"  (1.803 m)   Wt 201 lb 9.6 oz (91.4 kg)   SpO2 98%   BMI 28.12 kg/m    Subjective:    Patient ID: Dwayne Jacobson, Dwayne Jacobson    DOB: 03-Jun-1955, 62 y.o.   MRN: [id number]  HPI: Dwayne Jacobson is a 62 y.o. Dwayne Jacobson  Chief Complaint  Patient presents with  . DVT    PT/ INR- pt taking 1 mg 6 days per week and 2 mg 1 day per week    Pt here today for INR f/u. Taking 1 mg 6 days per week and 2 mg 1 day per week for hx of DVT. Has been stable on this dose for quite some time. Denies bleeding or bruising issues, diet changes, new medications, CP, SOB, palpitations. No new leg pain sxs.   Relevant past medical, surgical, family and social history reviewed and updated as indicated. Interim medical history since our last visit reviewed. Allergies and medications reviewed and updated.  Review of Systems  Per HPI unless specifically indicated above     Objective:    BP (!) 150/80   Pulse (!) 59   Temp 98.3 F (36.8 C) (Oral)   Ht 5\' 11"  (1.803 m)   Wt 201 lb 9.6 oz (91.4 kg)   SpO2 98%   BMI 28.12 kg/m   Wt Readings from Last 3 Encounters:  08/26/17 201 lb 9.6 oz (91.4 kg)  07/08/17 202 lb (91.6 kg)  06/24/17 204 lb (92.5 kg)    Physical Exam  Constitutional: He is oriented to person, place, and time. He appears well-developed and well-nourished. No distress.  HENT:  Head: Atraumatic.  Eyes: Conjunctivae and EOM are normal.  Neck: Normal range of motion. Neck supple.  Cardiovascular: Normal rate and regular rhythm.  Pulmonary/Chest: Effort normal and breath sounds normal.  Musculoskeletal: Normal range of motion.  Neurological: He is alert and oriented to person, place, and time.  Skin: Skin is warm and dry.  Psychiatric: He has a normal mood and affect. His behavior is normal.  Nursing note and vitals reviewed.   Results for orders placed or performed in visit on 08/26/17  CoaguChek XS/INR [name]    Result Value Ref Range   INR 2.7 (H) 0.9 - 1.1   Prothrombin Time 32.6 sec      Assessment & Plan:   Problem List Items Addressed This Visit      Cardiovascular and Mediastinum   DVT (deep venous thrombosis) ([facility]) - Primary    INR stable today at 2.7. Continue current regimen      Relevant Orders   CoaguChek XS/INR [name] (Completed)       Follow up plan: Return in about 1 month (around 09/23/2017) for CPE.

## 2017-08-27 LAB — COAGUCHEK XS/INR WAIVED
INR: 2.7 — ABNORMAL HIGH (ref 0.9–1.1)
Prothrombin Time: 32.6 s

## 2017-08-29 NOTE — Assessment & Plan Note (Signed)
INR stable today at 2.7. Continue current regimen 

## 2017-08-29 NOTE — Patient Instructions (Signed)
Follow up in 1 month   

## 2017-09-16 ENCOUNTER — Encounter: Payer: 59 | Admitting: Unknown Physician Specialty

## 2017-09-20 ENCOUNTER — Encounter: Payer: 59 | Admitting: Physician Assistant

## 2017-09-30 ENCOUNTER — Other Ambulatory Visit: Payer: Self-pay

## 2017-09-30 ENCOUNTER — Ambulatory Visit (INDEPENDENT_AMBULATORY_CARE_PROVIDER_SITE_OTHER): Payer: 59 | Admitting: Family Medicine

## 2017-09-30 ENCOUNTER — Encounter: Payer: Self-pay | Admitting: Family Medicine

## 2017-09-30 VITALS — BP 121/74 | HR 58 | Temp 98.5°F | Ht 71.0 in | Wt 197.0 lb

## 2017-09-30 DIAGNOSIS — I10 Essential (primary) hypertension: Secondary | ICD-10-CM

## 2017-09-30 DIAGNOSIS — Z0000 Encounter for general adult medical examination without abnormal findings: Secondary | ICD-10-CM | POA: Diagnosis not present

## 2017-09-30 DIAGNOSIS — I82519 Chronic embolism and thrombosis of unspecified femoral vein: Secondary | ICD-10-CM | POA: Diagnosis not present

## 2017-09-30 DIAGNOSIS — E782 Mixed hyperlipidemia: Secondary | ICD-10-CM

## 2017-09-30 LAB — COAGUCHEK XS/INR WAIVED
INR: 1.9 — AB (ref 0.9–1.1)
Prothrombin Time: 22.8 s

## 2017-09-30 LAB — UA/M W/RFLX CULTURE, ROUTINE
BILIRUBIN UA: NEGATIVE
Glucose, UA: NEGATIVE
Ketones, UA: NEGATIVE
NITRITE UA: NEGATIVE
PH UA: 6 (ref 5.0–7.5)
Protein, UA: NEGATIVE
Specific Gravity, UA: 1.005 (ref 1.005–1.030)
UUROB: 0.2 mg/dL (ref 0.2–1.0)

## 2017-09-30 LAB — MICROSCOPIC EXAMINATION

## 2017-09-30 NOTE — Progress Notes (Signed)
BP 121/74   Pulse (!) 58   Temp 98.5 F (36.9 C) (Oral)   Ht 5\' 11"  (1.803 m)   Wt 197 lb (89.4 kg)   SpO2 98%   BMI 27.48 kg/m    Subjective:    Patient ID: Dwayne Jacobson, male    DOB: 1955-11-05, 62 y.o.   MRN: [id number]  HPI: Dwayne Jacobson is a 62 y.o. male presenting on 09/30/2017 for comprehensive medical examination. Current medical complaints include:see below  BPs under good control when checked outside of clinic. Compliant with his medicines, no reported side effects. Denies CP, SOB, palpitations, syncope. Taking lipitor for cholesterol management, no side effects. Tries to eat a heart healthy diet and exercise as much as possible.   On chronic coumadin for DVT, no bleeding or bruising issues noted. Taking 1 mg 6 days per week and 2 mg 1 day per week.   On plaquenil for lupus, managed by Rheumatology.   Depression Screen done today and results listed below:  Depression screen [facility] 2/9 09/30/2017 03/04/2017 01/30/2016  Decreased Interest 0 0 0  Down, Depressed, Hopeless 0 0 0  PHQ - 2 Score 0 0 0  Altered sleeping 3 0 -  Tired, decreased energy 3 1 -  Change in appetite 0 0 -  Feeling bad or failure about yourself  0 0 -  Trouble concentrating 0 0 -  Moving slowly or fidgety/restless 0 0 -  Suicidal thoughts 0 0 -  PHQ-9 Score 6 1 -    The patient does not have a history of falls. I did not complete a risk assessment for falls. A plan of care for falls was not documented.   Past Medical History:  Past Medical History:  Diagnosis Date  . DVT (deep venous thrombosis) ([place])   . Hyperlipidemia   . Hypertension   . Seizures ([place])     Surgical History:  History reviewed. No pertinent surgical history.  Medications:  Current Outpatient Medications on File Prior to Visit  Medication Sig  . amLODipine (NORVASC) 5 MG tablet Take 1 tablet (5 mg total) by mouth daily.  Marland Kitchen atorvastatin (LIPITOR) 10 MG tablet TAKE ONE TABLET BY MOUTH DAILY AT 6PM  .  hydrochlorothiazide (HYDRODIURIL) 25 MG tablet Take 1 tablet (25 mg total) by mouth every morning.  . hydroxychloroquine (PLAQUENIL) 200 MG tablet Take 200 mg by mouth daily.  Marland Kitchen lisinopril (PRINIVIL,ZESTRIL) 20 MG tablet Take 1 tablet (20 mg total) by mouth daily.  Marland Kitchen olopatadine (PATANOL) 0.1 % ophthalmic solution   . warfarin (COUMADIN) 2 MG tablet TAKE ONE TABLET BY MOUTH 2 DAYS A WEEK. THEN TAKE ONE-HALF TABLET BY MOUTH THE OTHER 5 DAYS OF THE WEEK, OR AS DIRECTED BY COUMADIN CLINIC   No current facility-administered medications on file prior to visit.     Allergies:  No Known Allergies  Social History:  Social History   Socioeconomic History  . Marital status: Married    Spouse name: Not on file  . Number of children: Not on file  . Years of education: Not on file  . Highest education level: Not on file  Occupational History  . Not on file  Social Needs  . Financial resource strain: Not on file  . Food insecurity:    Worry: Not on file    Inability: Not on file  . Transportation needs:    Medical: Not on file    Non-medical: Not on file  Tobacco Use  . Smoking  status: Former Smoker    Last attempt to quit: 08/30/2013    Years since quitting: 4.0  . Smokeless tobacco: Former Network engineer and Sexual Activity  . Alcohol use: Yes    Alcohol/week: 8.0 - 10.0 standard drinks    Types: 8 - 10 Cans of beer per week  . Drug use: Never  . Sexual activity: Yes  Lifestyle  . Physical activity:    Days per week: Not on file    Minutes per session: Not on file  . Stress: Not on file  Relationships  . Social connections:    Talks on phone: Not on file    Gets together: Not on file    Attends religious service: Not on file    Active member of club or organization: Not on file    Attends meetings of clubs or organizations: Not on file    Relationship status: Not on file  . Intimate partner violence:    Fear of current or ex partner: Not on file    Emotionally abused: Not  on file    Physically abused: Not on file    Forced sexual activity: Not on file  Other Topics Concern  . Not on file  Social History Narrative  . Not on file   Social History   Tobacco Use  Smoking Status Former Smoker  . Last attempt to quit: 08/30/2013  . Years since quitting: 4.0  Smokeless Tobacco Former Systems developer   Social History   Substance and Sexual Activity  Alcohol Use Yes  . Alcohol/week: 8.0 - 10.0 standard drinks  . Types: 8 - 10 Cans of beer per week    Family History:  Family History  Problem Relation Age of Onset  . Diabetes Mother   . Hypertension Mother   . Hyperlipidemia Mother   . Stroke Mother   . Lung disease Mother   . Heart disease Mother   . Heart disease Father   . Lung disease Father   . Diabetes Sister     Past medical history, surgical history, medications, allergies, family history and social history reviewed with patient today and changes made to appropriate areas of the chart.   Review of Systems - General ROS: negative Psychological ROS: negative Ophthalmic ROS: negative ENT ROS: negative Allergy and Immunology ROS: negative Hematological and Lymphatic ROS: negative Respiratory ROS: no cough, shortness of breath, or wheezing Cardiovascular ROS: no chest pain or dyspnea on exertion Gastrointestinal ROS: no abdominal pain, change in bowel habits, or black or bloody stools Genito-Urinary ROS: no dysuria, trouble voiding, or hematuria Musculoskeletal ROS: negative Neurological ROS: no TIA or stroke symptoms Dermatological ROS: negative All other ROS negative except what is listed above and in the HPI.      Objective:    BP 121/74   Pulse (!) 58   Temp 98.5 F (36.9 C) (Oral)   Ht 5\' 11"  (1.803 m)   Wt 197 lb (89.4 kg)   SpO2 98%   BMI 27.48 kg/m   Wt Readings from Last 3 Encounters:  09/30/17 197 lb (89.4 kg)  08/26/17 201 lb 9.6 oz (91.4 kg)  07/08/17 202 lb (91.6 kg)    Physical Exam  Constitutional: He is oriented to  person, place, and time. He appears well-developed and well-nourished. No distress.  HENT:  Head: Atraumatic.  Right Ear: External ear normal.  Left Ear: External ear normal.  Nose: Nose normal.  Mouth/Throat: Oropharynx is clear and moist.  Eyes: Pupils are equal,  round, and reactive to light. Conjunctivae are normal. No scleral icterus.  Neck: Normal range of motion. Neck supple.  Cardiovascular: Normal rate, regular rhythm, normal heart sounds and intact distal pulses.  No murmur heard. Pulmonary/Chest: Effort normal and breath sounds normal. No respiratory distress.  Abdominal: Soft. Bowel sounds are normal. He exhibits no distension and no mass. There is no tenderness. There is no guarding.  Genitourinary: Rectum normal.  Genitourinary Comments: Prostate mildly enlarged  Musculoskeletal: Normal range of motion. He exhibits no edema or tenderness.  Neurological: He is alert and oriented to person, place, and time. He has normal reflexes.  Skin: Skin is warm and dry. No rash noted.  Psychiatric: He has a normal mood and affect. His behavior is normal.  Nursing note and vitals reviewed.   Results for orders placed or performed in visit on 09/30/17  Microscopic Examination  Result Value Ref Range   WBC, UA 0-5 0 - 5 /hpf   RBC, UA 0-2 0 - 2 /hpf   Epithelial Cells (non renal) 0-10 0 - 10 /hpf   Bacteria, UA Few (A) None seen/Few  CBC with Differential/Platelet  Result Value Ref Range   WBC 5.4 3.4 - 10.8 x10E3/uL   RBC 4.44 4.14 - 5.80 x10E6/uL   Hemoglobin 13.7 13.0 - 17.7 g/dL   Hematocrit 39.7 37.5 - 51.0 %   MCV 89 79 - 97 fL   MCH 30.9 26.6 - 33.0 pg   MCHC 34.5 31.5 - 35.7 g/dL   RDW 13.0 12.3 - 15.4 %   Platelets 333 150 - 450 x10E3/uL   Neutrophils 60 Not Estab. %   Lymphs 20 Not Estab. %   Monocytes 13 Not Estab. %   Eos 6 Not Estab. %   Basos 1 Not Estab. %   Neutrophils Absolute 3.2 1.4 - 7.0 x10E3/uL   Lymphocytes Absolute 1.1 0.7 - 3.1 x10E3/uL   Monocytes  Absolute 0.7 0.1 - 0.9 x10E3/uL   EOS (ABSOLUTE) 0.3 0.0 - 0.4 x10E3/uL   Basophils Absolute 0.1 0.0 - 0.2 x10E3/uL   Immature Granulocytes 0 Not Estab. %   Immature Grans (Abs) 0.0 0.0 - 0.1 x10E3/uL  Comprehensive metabolic panel  Result Value Ref Range   Glucose 84 65 - 99 mg/dL   BUN 12 8 - 27 mg/dL   Creatinine, Ser 0.78 0.76 - 1.27 mg/dL   GFR calc non Af Amer 97 >59 mL/min/1.73   GFR calc Af Amer 112 >59 mL/min/1.73   BUN/Creatinine Ratio 15 10 - 24   Sodium 132 (L) 134 - 144 mmol/L   Potassium 4.0 3.5 - 5.2 mmol/L   Chloride 93 (L) 96 - 106 mmol/L   CO2 23 20 - 29 mmol/L   Calcium 9.6 8.6 - 10.2 mg/dL   Total Protein 7.7 6.0 - 8.5 g/dL   Albumin 4.8 3.6 - 4.8 g/dL   Globulin, Total 2.9 1.5 - 4.5 g/dL   Albumin/Globulin Ratio 1.7 1.2 - 2.2   Bilirubin Total 0.2 0.0 - 1.2 mg/dL   Alkaline Phosphatase 65 39 - 117 IU/L   AST 27 0 - 40 IU/L   ALT 28 0 - 44 IU/L  Lipid Panel w/o Chol/HDL Ratio  Result Value Ref Range   Cholesterol, Total 150 100 - 199 mg/dL   Triglycerides 109 0 - 149 mg/dL   HDL 44 >39 mg/dL   VLDL Cholesterol Cal 22 5 - 40 mg/dL   LDL Calculated 84 0 - 99 mg/dL  UA/M w/rflx Culture,  Routine  Result Value Ref Range   Specific Gravity, UA 1.005 1.005 - 1.030   pH, UA 6.0 5.0 - 7.5   Color, UA Yellow Yellow   Appearance Ur Clear Clear   Leukocytes, UA Trace Negative   Protein, UA Negative Negative/Trace   Glucose, UA Negative Negative   Ketones, UA Negative Negative   RBC, UA Trace Negative   Bilirubin, UA Negative Negative   Urobilinogen, Ur 0.2 0.2 - 1.0 mg/dL   Nitrite, UA Negative Negative   Microscopic Examination See below:   CoaguChek XS/INR [name]  Result Value Ref Range   INR 1.9 (H) 0.9 - 1.1   Prothrombin Time 22.8 sec      Assessment & Plan:   Problem List Items Addressed This Visit      Cardiovascular and Mediastinum   DVT (deep venous thrombosis) (HCC)   Relevant Orders   CoaguChek XS/INR [name]   Hypertension - Primary    Relevant Orders   CBC with Differential/Platelet (Completed)   Comprehensive metabolic panel (Completed)   UA/M w/rflx Culture, Routine (Completed)     Other   Hyperlipidemia   Relevant Orders   Lipid Panel w/o Chol/HDL Ratio (Completed)    Other Visit Diagnoses    Annual physical exam           Discussed aspirin prophylaxis for myocardial infarction prevention and decision was it was not indicated  LABORATORY TESTING:  Health maintenance labs ordered today as discussed above.   The natural history of prostate cancer and ongoing controversy regarding screening and potential treatment outcomes of prostate cancer has been discussed with the patient. The meaning of a false positive PSA and a false negative PSA has been discussed. He indicates understanding of the limitations of this screening test and wishes not to proceed with screening PSA testing.   IMMUNIZATIONS:   - Tdap: Tetanus vaccination status reviewed: last tetanus booster within 10 years. - Influenza: Refused  SCREENING: - Colonoscopy: Up to date  Discussed with patient purpose of the colonoscopy is to detect colon cancer at curable precancerous or early stages   PATIENT COUNSELING:    Sexuality: Discussed sexually transmitted diseases, partner selection, use of condoms, avoidance of unintended pregnancy  and contraceptive alternatives.   Advised to avoid cigarette smoking.  I discussed with the patient that most people either abstain from alcohol or drink within safe limits (<=14/week and <=4 drinks/occasion for males, <=7/weeks and <= 3 drinks/occasion for females) and that the risk for alcohol disorders and other health effects rises proportionally with the number of drinks per week and how often a drinker exceeds daily limits.  Discussed cessation/primary prevention of drug use and availability of treatment for abuse.   Diet: Encouraged to adjust caloric intake to maintain  or achieve ideal body weight, to reduce  intake of dietary saturated fat and total fat, to limit sodium intake by avoiding high sodium foods and not adding table salt, and to maintain adequate dietary potassium and calcium preferably from fresh fruits, vegetables, and low-fat dairy products.    stressed the importance of regular exercise  Injury prevention: Discussed safety belts, safety helmets, smoke detector, smoking near bedding or upholstery.   Dental health: Discussed importance of regular tooth brushing, flossing, and dental visits.   Follow up plan: NEXT PREVENTATIVE PHYSICAL DUE IN 1 YEAR. Return in about 4 weeks (around 10/28/2017) for INR.

## 2017-10-01 LAB — LIPID PANEL W/O CHOL/HDL RATIO
Cholesterol, Total: 150 mg/dL (ref 100–199)
HDL: 44 mg/dL (ref >39)
LDL CALC: 84 mg/dL (ref 0–99)
Triglycerides: 109 mg/dL (ref 0–149)
VLDL Cholesterol Cal: 22 mg/dL (ref 5–40)

## 2017-10-01 LAB — COMPREHENSIVE METABOLIC PANEL
ALBUMIN: 4.8 g/dL (ref 3.6–4.8)
ALT: 28 IU/L (ref 0–44)
AST: 27 IU/L (ref 0–40)
Albumin/Globulin Ratio: 1.7 (ref 1.2–2.2)
Alkaline Phosphatase: 65 IU/L (ref 39–117)
BUN / CREAT RATIO: 15 (ref 10–24)
BUN: 12 mg/dL (ref 8–27)
Bilirubin Total: 0.2 mg/dL (ref 0.0–1.2)
CO2: 23 mmol/L (ref 20–29)
Calcium: 9.6 mg/dL (ref 8.6–10.2)
Chloride: 93 mmol/L — ABNORMAL LOW (ref 96–106)
Creatinine, Ser: 0.78 mg/dL (ref 0.76–1.27)
GFR calc non Af Amer: 97 mL/min/{1.73_m2} (ref >59)
GFR, EST AFRICAN AMERICAN: 112 mL/min/{1.73_m2} (ref >59)
GLOBULIN, TOTAL: 2.9 g/dL (ref 1.5–4.5)
GLUCOSE: 84 mg/dL (ref 65–99)
Potassium: 4 mmol/L (ref 3.5–5.2)
Sodium: 132 mmol/L — ABNORMAL LOW (ref 134–144)
TOTAL PROTEIN: 7.7 g/dL (ref 6.0–8.5)

## 2017-10-01 LAB — CBC WITH DIFFERENTIAL/PLATELET
Basophils Absolute: 0.1 10*3/uL (ref 0.0–0.2)
Basos: 1 %
EOS (ABSOLUTE): 0.3 10*3/uL (ref 0.0–0.4)
EOS: 6 %
HEMATOCRIT: 39.7 % (ref 37.5–51.0)
HEMOGLOBIN: 13.7 g/dL (ref 13.0–17.7)
Immature Grans (Abs): 0 10*3/uL (ref 0.0–0.1)
Immature Granulocytes: 0 %
LYMPHS ABS: 1.1 10*3/uL (ref 0.7–3.1)
Lymphs: 20 %
MCH: 30.9 pg (ref 26.6–33.0)
MCHC: 34.5 g/dL (ref 31.5–35.7)
MCV: 89 fL (ref 79–97)
MONOCYTES: 13 %
Monocytes Absolute: 0.7 10*3/uL (ref 0.1–0.9)
NEUTROS ABS: 3.2 10*3/uL (ref 1.4–7.0)
Neutrophils: 60 %
Platelets: 333 10*3/uL (ref 150–450)
RBC: 4.44 x10E6/uL (ref 4.14–5.80)
RDW: 13 % (ref 12.3–15.4)
WBC: 5.4 10*3/uL (ref 3.4–10.8)

## 2017-10-28 ENCOUNTER — Encounter: Payer: Self-pay | Admitting: Family Medicine

## 2017-10-28 ENCOUNTER — Other Ambulatory Visit: Payer: Self-pay

## 2017-10-28 ENCOUNTER — Ambulatory Visit: Payer: 59 | Admitting: Family Medicine

## 2017-10-28 VITALS — BP 108/66 | HR 67 | Temp 98.2°F | Ht 71.0 in | Wt 196.0 lb

## 2017-10-28 DIAGNOSIS — I82519 Chronic embolism and thrombosis of unspecified femoral vein: Secondary | ICD-10-CM

## 2017-10-28 LAB — COAGUCHEK XS/INR WAIVED
INR: 2.5 — ABNORMAL HIGH (ref 0.9–1.1)
Prothrombin Time: 29.8 s

## 2017-10-28 LAB — HM DIABETES EYE EXAM

## 2017-10-28 NOTE — Patient Instructions (Signed)
Follow up for INR in 6 weeks

## 2017-10-28 NOTE — Assessment & Plan Note (Signed)
INR at goal today at 2.5. Continue current regimen

## 2017-10-28 NOTE — Progress Notes (Signed)
   BP 108/66   Pulse 67   Temp 98.2 F (36.8 C) (Oral)   Ht 5\' 11"  (1.803 m)   Wt 196 lb (88.9 kg)   SpO2 97%   BMI 27.34 kg/m    Subjective:    Patient ID: Dwayne Jacobson, male    DOB: 1955-12-31, 62 y.o.   MRN: [id number]  HPI: Dwayne Jacobson is a 62 y.o. male  Chief Complaint  Patient presents with  . Coagulation Disorder    f/u   Here today for INR f/u for hx of DVT. Taking 1 mg 6 days per week and 2 mg 1 day per week. Typically very stable at this dose. No new medications, diet stable, no bleeding or bruising issues noted. Last INR minimally subtherapeutic at 1.9.   Relevant past medical, surgical, family and social history reviewed and updated as indicated. Interim medical history since our last visit reviewed. Allergies and medications reviewed and updated.  Review of Systems  Per HPI unless specifically indicated above     Objective:    BP 108/66   Pulse 67   Temp 98.2 F (36.8 C) (Oral)   Ht 5\' 11"  (1.803 m)   Wt 196 lb (88.9 kg)   SpO2 97%   BMI 27.34 kg/m   Wt Readings from Last 3 Encounters:  10/28/17 196 lb (88.9 kg)  09/30/17 197 lb (89.4 kg)  08/26/17 201 lb 9.6 oz (91.4 kg)    Physical Exam  Constitutional: He is oriented to person, place, and time. He appears well-developed and well-nourished. No distress.  HENT:  Head: Atraumatic.  Eyes: Conjunctivae and EOM are normal.  Neck: Normal range of motion. Neck supple.  Cardiovascular: Normal rate and regular rhythm.  Pulmonary/Chest: Effort normal and breath sounds normal.  Musculoskeletal: Normal range of motion.  Neurological: He is alert and oriented to person, place, and time.  Skin: Skin is warm and dry.  Psychiatric: He has a normal mood and affect. His behavior is normal.  Nursing note and vitals reviewed.   Results for orders placed or performed in visit on 10/28/17  CoaguChek XS/INR [name]  Result Value Ref Range   INR 2.5 (H) 0.9 - 1.1   Prothrombin Time 29.8 sec        Assessment & Plan:   Problem List Items Addressed This Visit      Cardiovascular and Mediastinum   DVT (deep venous thrombosis) ([facility]CC) - Primary    INR at goal today at 2.5. Continue current regimen      Relevant Orders   CoaguChek XS/INR [name] (Completed)       Follow up plan: Return in about 6 weeks (around 12/09/2017) for INR.

## 2017-11-25 NOTE — Progress Notes (Deleted)
Office Visit Note  Patient: Dwayne Jacobson             Date of Birth: July 24, 1955           MRN: [id number]             PCP: Kathrine Haddock, NP Referring: Kathrine Haddock, NP Visit Date: 12/09/2017 Occupation: @GUAROCC @  Subjective:  No chief complaint on file.   History of Present Illness: Dwayne Jacobson is a 62 y.o. male ***   Activities of Daily Living:  Patient reports morning stiffness for *** {minute/hour:19697}.   Patient {ACTIONS;DENIES/REPORTS:21021675::"Denies"} nocturnal pain.  Difficulty dressing/grooming: {ACTIONS;DENIES/REPORTS:21021675::"Denies"} Difficulty climbing stairs: {ACTIONS;DENIES/REPORTS:21021675::"Denies"} Difficulty getting out of chair: {ACTIONS;DENIES/REPORTS:21021675::"Denies"} Difficulty using hands for taps, buttons, cutlery, and/or writing: {ACTIONS;DENIES/REPORTS:21021675::"Denies"}  No Rheumatology ROS completed.   PMFS History:  Patient Active Problem List   Diagnosis Date Noted  . Encounter for monitoring Coumadin therapy 04/01/2017  . Chondrocalcinosis 01/13/2017  . Primary osteoarthritis of both knees 01/13/2017  . Therapeutic drug monitoring 04/11/2015  . DVT (deep venous thrombosis) ([place])   . Hypertension   . Hyperlipidemia   . SLE (systemic lupus erythematosus) ([place]) 01/18/2014    Past Medical History:  Diagnosis Date  . DVT (deep venous thrombosis) ([place])   . Hyperlipidemia   . Hypertension   . Seizures ([place])     Family History  Problem Relation Age of Onset  . Diabetes Mother   . Hypertension Mother   . Hyperlipidemia Mother   . Stroke Mother   . Lung disease Mother   . Heart disease Mother   . Heart disease Father   . Lung disease Father   . Diabetes Sister    No past surgical history on file. Social History   Social History Narrative  . Not on file    Objective: Vital Signs: There were no vitals taken for this visit.   Physical Exam   Musculoskeletal Exam: ***  CDAI Exam: CDAI Score: Not  documented Patient Global Assessment: Not documented; Provider Global Assessment: Not documented Swollen: Not documented; Tender: Not documented Joint Exam   Not documented   There is currently no information documented on the homunculus. Go to the Rheumatology activity and complete the homunculus joint exam.  Investigation: No additional findings.  Imaging: No results found.  Recent Labs: Lab Results  Component Value Date   WBC 5.4 09/30/2017   HGB 13.7 09/30/2017   PLT 333 09/30/2017   NA 132 (L) 09/30/2017   K 4.0 09/30/2017   CL 93 (L) 09/30/2017   CO2 23 09/30/2017   GLUCOSE 84 09/30/2017   BUN 12 09/30/2017   CREATININE 0.78 09/30/2017   BILITOT 0.2 09/30/2017   ALKPHOS 65 09/30/2017   AST 27 09/30/2017   ALT 28 09/30/2017   PROT 7.7 09/30/2017   ALBUMIN 4.8 09/30/2017   CALCIUM 9.6 09/30/2017   GFRAA 112 09/30/2017    Speciality Comments: PLQ Eye Exam: 10/29/16 WNL @ [place]  Procedures:  No procedures performed Allergies: Patient has no known allergies.   Assessment / Plan:     Visit Diagnoses: No diagnosis found.   Orders: No orders of the defined types were placed in this encounter.  No orders of the defined types were placed in this encounter.   Face-to-face time spent with patient was *** minutes. Greater than 50% of time was spent in counseling and coordination of care.  Follow-Up Instructions: No follow-ups on file.   Earnestine Mealing, CMA  Note - This  record has been created using [employer].  Chart creation errors have been sought, but may not always  have been located. Such creation errors do not reflect on  the standard of medical care.

## 2017-12-02 ENCOUNTER — Ambulatory Visit (INDEPENDENT_AMBULATORY_CARE_PROVIDER_SITE_OTHER): Payer: 59 | Admitting: Unknown Physician Specialty

## 2017-12-02 ENCOUNTER — Other Ambulatory Visit: Payer: 59

## 2017-12-02 ENCOUNTER — Encounter: Payer: Self-pay | Admitting: Unknown Physician Specialty

## 2017-12-02 ENCOUNTER — Ambulatory Visit: Payer: 59 | Admitting: Family Medicine

## 2017-12-02 VITALS — BP 131/70 | HR 68 | Temp 97.7°F | Wt 201.0 lb

## 2017-12-02 DIAGNOSIS — I82519 Chronic embolism and thrombosis of unspecified femoral vein: Secondary | ICD-10-CM | POA: Diagnosis not present

## 2017-12-02 DIAGNOSIS — Z5181 Encounter for therapeutic drug level monitoring: Secondary | ICD-10-CM

## 2017-12-02 NOTE — Progress Notes (Signed)
   BP 131/70   Pulse 68   Temp 97.7 F (36.5 C) (Oral)   Wt 201 lb (91.2 kg)   SpO2 97%   BMI 28.03 kg/m    Subjective:    Patient ID: Dwayne Jacobson, male    DOB: 02-17-1955, 62 y.o.   MRN: [id number]  HPI: Dwayne Jacobson is a 62 y.o. male  Chief Complaint  Patient presents with  . Coagulation Disorder   INR- On coumadin due to history of DVT.  INRs have been stable. Pt taking 1 mg 6 days and 2 mg one day/week.  No bruising, blood in stool or urine.    Relevant past medical, surgical, family and social history reviewed and updated as indicated. Interim medical history since our last visit reviewed. Allergies and medications reviewed and updated.  Review of Systems  Constitutional: Negative.   Skin: Negative.     Per HPI unless specifically indicated above     Objective:    BP 131/70   Pulse 68   Temp 97.7 F (36.5 C) (Oral)   Wt 201 lb (91.2 kg)   SpO2 97%   BMI 28.03 kg/m   Wt Readings from Last 3 Encounters:  12/02/17 201 lb (91.2 kg)  10/28/17 196 lb (88.9 kg)  09/30/17 197 lb (89.4 kg)    Physical Exam  Constitutional: He is oriented to person, place, and time. He appears well-developed and well-nourished. No distress.  HENT:  Head: Normocephalic and atraumatic.  Eyes: Conjunctivae and lids are normal. Right eye exhibits no discharge. Left eye exhibits no discharge. No scleral icterus.  Pulmonary/Chest: Effort normal.  Abdominal: Normal appearance. There is no splenomegaly or hepatomegaly.  Musculoskeletal: Normal range of motion.  Neurological: He is alert and oriented to person, place, and time.  Skin: Skin is intact. No rash noted. No pallor.  Psychiatric: He has a normal mood and affect. His behavior is normal. Judgment and thought content normal.    Results for orders placed or performed in visit on 10/28/17  CoaguChek XS/INR [name]  Result Value Ref Range   INR 2.5 (H) 0.9 - 1.1   Prothrombin Time 29.8 sec      Assessment & Plan:    Problem List Items Addressed This Visit      Unprioritized   DVT (deep venous thrombosis) ([place]) - Primary   Relevant Orders   CoaguChek XS/INR [name]   Therapeutic drug monitoring       Follow up plan: Return in about 4 weeks (around 12/30/2017).  Due for chronic disease check February

## 2017-12-03 LAB — COAGUCHEK XS/INR WAIVED
INR: 2.4 — AB (ref 0.9–1.1)
PROTHROMBIN TIME: 29.1 s

## 2017-12-09 ENCOUNTER — Ambulatory Visit: Payer: 59 | Admitting: Rheumatology

## 2017-12-09 NOTE — Progress Notes (Signed)
Office Visit Note  Patient: Dwayne Jacobson             Date of Birth: 1955/09/22           MRN: [id number]             PCP: Venita Lick, NP Referring: Kathrine Haddock, NP Visit Date: 12/23/2017 Occupation: @GUAROCC @  Subjective:  Nose sores    History of Present Illness: Dwayne Jacobson is a 62 y.o. male with history of systemic lupus erythematosus and osteoarthritis.  He denies any recent lupus flares.  He continues to take Plaquenil 200 mg 1 tablet by mouth daily.  He states he continues to have recurrent nasal ulcerations that are painful and he applies Neosporin.  He denies any mouth sores.  He denies any recent rashes.  He denies any swollen lymph nodes or low-grade fevers recently.  He states his fatigue has been stable.  He denies any joint pain or joint swelling at this time.  He reports that he has intermittent symptoms of Raynaud's but denies any digital ulcerations.  He continues to have eye dryness but denies any mouth dryness.  He denies any new or worsening symptoms at this time.    Activities of Daily Living:  Patient reports morning stiffness for 0 none.   Patient Denies nocturnal pain.  Difficulty dressing/grooming: Denies Difficulty climbing stairs: Denies Difficulty getting out of chair: Denies Difficulty using hands for taps, buttons, cutlery, and/or writing: Denies  Review of Systems  Constitutional: Positive for fatigue. Negative for night sweats.  HENT: Negative for mouth sores, trouble swallowing, trouble swallowing, mouth dryness and nose dryness.   Eyes: Positive for dryness. Negative for pain, redness and visual disturbance.  Respiratory: Negative for cough, hemoptysis, shortness of breath and difficulty breathing.   Cardiovascular: Negative for chest pain, palpitations, hypertension, irregular heartbeat and swelling in legs/feet.  Gastrointestinal: Negative for blood in stool, constipation and diarrhea.  Endocrine: Negative for cold intolerance and  increased urination.  Genitourinary: Negative for difficulty urinating and painful urination.  Musculoskeletal: Negative for arthralgias, joint pain, joint swelling, myalgias, muscle weakness, morning stiffness, muscle tenderness and myalgias.  Skin: Negative for color change, rash, hair loss, nodules/bumps, skin tightness, ulcers and sensitivity to sunlight.  Allergic/Immunologic: Negative for susceptible to infections.  Neurological: Negative for dizziness, fainting, numbness, memory loss, night sweats and weakness.  Hematological: Negative for bruising/bleeding tendency and swollen glands.  Psychiatric/Behavioral: Positive for sleep disturbance. Negative for depressed mood. The patient is not nervous/anxious.     PMFS History:  Patient Active Problem List   Diagnosis Date Noted  . Encounter for monitoring Coumadin therapy 04/01/2017  . Chondrocalcinosis 01/13/2017  . Primary osteoarthritis of both knees 01/13/2017  . Therapeutic drug monitoring 04/11/2015  . DVT (deep venous thrombosis) ([place])   . Hypertension   . Hyperlipidemia   . SLE (systemic lupus erythematosus) ([place]) 01/18/2014    Past Medical History:  Diagnosis Date  . DVT (deep venous thrombosis) ([place])   . Hyperlipidemia   . Hypertension   . Seizures ([place])   . Skin cancer   . Systemic lupus erythematosus ([facility])     Family History  Problem Relation Age of Onset  . Diabetes Mother   . Hypertension Mother   . Hyperlipidemia Mother   . Stroke Mother   . Lung disease Mother   . Heart disease Mother   . Heart disease Father   . Lung disease Father   . Diabetes Sister  History reviewed. No pertinent surgical history. Social History   Social History Narrative  . Not on file    Objective: Vital Signs: BP 112/64 (BP Location: Left Arm, Patient Position: Sitting, Cuff Size: Normal)   Pulse 70   Resp 18   Ht 5\' 11"  (1.803 m)   Wt 197 lb 3.2 oz (89.4 kg)   BMI 27.50 kg/m    Physical Exam  Constitutional: He  is oriented to person, place, and time. He appears well-developed and well-nourished.  HENT:  Head: Normocephalic and atraumatic.  Eyes: Pupils are equal, round, and reactive to light. Conjunctivae and EOM are normal.  Neck: Normal range of motion. Neck supple.  Cardiovascular: Normal rate, regular rhythm and normal heart sounds.  Pulmonary/Chest: Effort normal and breath sounds normal.  Abdominal: Soft. Bowel sounds are normal.  Lymphadenopathy:    He has no cervical adenopathy.  Neurological: He is alert and oriented to person, place, and time.  Skin: Skin is warm and dry. Capillary refill takes less than 2 seconds.  Psychiatric: He has a normal mood and affect. His behavior is normal.  Nursing note and vitals reviewed.    Musculoskeletal Exam: C-spine, thoracic spine, and lumbar spine good ROM.  No midline spinal tenderness.  No SI joint tenderness. Shoulder joints, elbow joints, wrist joints, MCPs, PIPs, and DIPs good ROM with no synovitis. PIP and DIP synovial thickening.  Bilateral CMC joint synovial thickening bilaterally.  Complete fist formation bilaterally. Hip joints, knee joints, ankle joints, MTPs, PIPs, and DIPs good ROM with no synovitis.  No warmth or effusion of knee joints.  No tenderness or swelling of ankle joints.  No tenderness of trochanteric bursa bilaterally.   CDAI Exam: CDAI Score: Not documented Patient Global Assessment: Not documented; Provider Global Assessment: Not documented Swollen: Not documented; Tender: Not documented Joint Exam   Not documented   There is currently no information documented on the homunculus. Go to the Rheumatology activity and complete the homunculus joint exam.  Investigation: No additional findings.  Imaging: No results found.  Recent Labs: Lab Results  Component Value Date   WBC 5.4 09/30/2017   HGB 13.7 09/30/2017   PLT 333 09/30/2017   NA 132 (L) 09/30/2017   K 4.0 09/30/2017   CL 93 (L) 09/30/2017   CO2 23  09/30/2017   GLUCOSE 84 09/30/2017   BUN 12 09/30/2017   CREATININE 0.78 09/30/2017   BILITOT 0.2 09/30/2017   ALKPHOS 65 09/30/2017   AST 27 09/30/2017   ALT 28 09/30/2017   PROT 7.7 09/30/2017   ALBUMIN 4.8 09/30/2017   CALCIUM 9.6 09/30/2017   GFRAA 112 09/30/2017    Speciality Comments: PLQ Eye Exam: 10/29/16 WNL @ [facility]  Procedures:  No procedures performed Allergies: Patient has no known allergies.     Assessment / Plan:     Visit Diagnoses: Other systemic lupus erythematosus with other organ involvement ([place]) - Positive dsDNA, positive Ro. History of rash, diagnosed with lupus in the past: He has not had any recent lupus flares.  He is clinically doing well on plaquenil 200 mg 1 tablet by mouth daily. Autoimmune labs were obtained on 07/08/17-ANA negative, dsDNA stable, complements WNL, sed rate 6, UA normal. He continues to have recurrent nasal ulcerations but no oral ulcerations.  He has not had any recent rashes.  He continues to have intermittent symptoms of Raynaud's but no digital ulcerations were noted.  He experiences eye dryness but no mouth dryness.  He has  no parotid swelling on exam.  He has no cervical lymphadenopathy.  His fatigue has been stable and he has not had any recent low-grade fevers.  He will continue taking PLQ 200 mg 1 tablet by mouth daily.  He does not need any refills.  He will return in for lab work in January and every 5 months.  we will check autoimmune labs at that time. Future orders were placed today. He was advised to notify us if he develops any new or worsening symptoms.  He will follow up in 5 months.   High risk medication use - Plaquenil 200 mg 1 tablet by mouth once day. PLQ eye exam normal on 10/29/16.  CBC and CMP were WNL on 09/30/17.  I recommended that he receive the yearly influenza vaccination.   Primary osteoarthritis of both knees: No warmth or effusion.  Good ROM with no discomfort.    Other medical conditions are listed  as follows:   Chondrocalcinosis  Mixed hyperlipidemia  History of DVT (deep vein thrombosis)  Essential hypertension   Orders: Orders Placed This Encounter  Procedures  . Urinalysis, Routine w reflex microscopic  . C3 and C4  . Anti-DNA antibody, double-stranded  . Sedimentation rate  . COMPLETE METABOLIC PANEL WITH GFR  . CBC with Differential/Platelet   No orders of the defined types were placed in this encounter.    Follow-Up Instructions: Return for Systemic lupus erythematosus, Osteoarthritis.   Ofilia Neas, PA-C   I examined and evaluated the patient with Dwayne Sams PA.  Patient is clinically doing well.  He had no synovitis on examination.  He denies any rash.  He has underlying osteoarthritis in his knee joint which causes some discomfort.  The plan of care was discussed as noted above.  Bo Merino, MD  Note - This record has been created using Editor, commissioning.  Chart creation errors have been sought, but may not always  have been located. Such creation errors do not reflect on  the standard of medical care.

## 2017-12-23 ENCOUNTER — Encounter: Payer: Self-pay | Admitting: Rheumatology

## 2017-12-23 ENCOUNTER — Ambulatory Visit: Payer: 59 | Admitting: Rheumatology

## 2017-12-23 VITALS — BP 112/64 | HR 70 | Resp 18 | Ht 71.0 in | Wt 197.2 lb

## 2017-12-23 DIAGNOSIS — I10 Essential (primary) hypertension: Secondary | ICD-10-CM

## 2017-12-23 DIAGNOSIS — M170 Bilateral primary osteoarthritis of knee: Secondary | ICD-10-CM

## 2017-12-23 DIAGNOSIS — M3219 Other organ or system involvement in systemic lupus erythematosus: Secondary | ICD-10-CM | POA: Diagnosis not present

## 2017-12-23 DIAGNOSIS — M1120 Other chondrocalcinosis, unspecified site: Secondary | ICD-10-CM | POA: Diagnosis not present

## 2017-12-23 DIAGNOSIS — Z79899 Other long term (current) drug therapy: Secondary | ICD-10-CM | POA: Diagnosis not present

## 2017-12-23 DIAGNOSIS — E782 Mixed hyperlipidemia: Secondary | ICD-10-CM

## 2017-12-23 DIAGNOSIS — Z86718 Personal history of other venous thrombosis and embolism: Secondary | ICD-10-CM

## 2017-12-23 NOTE — Patient Instructions (Signed)
Standing Labs We placed an order today for your standing lab work.    Please come back and get your standing labs in January and every 5 months  Autoimmune labs and CBC/CMP  We have open lab Monday through Friday from 8:30-11:30 AM and 1:30-4:00 PM  at the office of Dr. Bo Merino.   You may experience shorter wait times on Monday and Friday afternoons. The office is located at [address], [place], [place], [place] [address] No appointment is necessary.   Labs are drawn by [facility].  You may receive a bill from [facility] for your lab work. If you have any questions regarding directions or hours of operation,  please call [phone].   Just as a reminder please drink plenty of water prior to coming for your lab work. Thanks!

## 2017-12-30 ENCOUNTER — Encounter: Payer: Self-pay | Admitting: Nurse Practitioner

## 2017-12-30 ENCOUNTER — Ambulatory Visit: Payer: 59 | Admitting: Nurse Practitioner

## 2017-12-30 VITALS — BP 109/72 | HR 56 | Temp 98.4°F | Ht 71.0 in | Wt 196.6 lb

## 2017-12-30 DIAGNOSIS — I82519 Chronic embolism and thrombosis of unspecified femoral vein: Secondary | ICD-10-CM

## 2017-12-30 LAB — COAGUCHEK XS/INR WAIVED
INR: 2.6 — AB (ref 0.9–1.1)
Prothrombin Time: 30.8 s

## 2017-12-30 NOTE — Patient Instructions (Signed)
Bleeding Precautions When on Anticoagulant Therapy  WHAT IS ANTICOAGULANT THERAPY?  Anticoagulant therapy is taking medicine to prevent or reduce blood clots. It is also called blood thinner therapy. Blood clots that form in your blood vessels can be dangerous. They can break loose and travel to your heart, lungs, or brain. This increases your risk of a heart attack or stroke. Anticoagulant therapy causes blood to clot more slowly.  You may need anticoagulant therapy if you have:   A medical condition that increases the likelihood that blood clots will form.   A heart defect or a problem with heart rhythm.  It is also a common treatment after heart surgery, such as valve replacement.  WHAT ARE COMMON TYPES OF ANTICOAGULANT THERAPY?  Anticoagulant medicine can be injected or taken by mouth.If you need anticoagulant therapy quickly at the hospital, the medicine may be injected under your skin or given through an IV tube. Heparin is a common example of an anticoagulant that you may get at the hospital.  Most anticoagulant therapy is in the form of pills that you take at home every day. These may include:   Aspirin. This common blood thinner works by preventing blood cells (platelets) from sticking together to form a clot. Aspirin is not as strong as anticoagulants that slow down the time that it takes for your body to form a clot.   Clopidogrel. This is a newer type of drug that affects platelets. It is stronger than aspirin.   Warfarin. This is the most common anticoagulant. It changes the way your body uses vitamin K, a vitamin that helps your blood to clot. The risk of bleeding is higher with warfarin than with aspirin. You will need frequent blood tests to make sure you are taking the safest amount.   New anticoagulants. Several new drugs have been approved. They are all taken by mouth. Studies show that these drugs work as well as warfarin. They do not require blood testing. They may cause less bleeding  risk than warfarin.  WHAT DO I NEED TO REMEMBER WHEN TAKING ANTICOAGULANT THERAPY?  Anticoagulant therapy decreases your risk of forming a blood clot, but it increases your risk of bleeding. Work closely with your health care provider to make sure you are taking your medicine safely. These tips can help:   Learn ways to reduce your risk of bleeding.   If you are taking warfarin:  ? Have blood tests as ordered by your health care provider.  ? Do not make any sudden changes to your diet. Vitamin K in your diet can make warfarin less effective.  ? Do not get pregnant. This medicine may cause birth defects.   Take your medicine at the same time every day. If you forget to take your medicine, take it as soon as you remember. If you miss a whole day, do not double your dose of medicine. Take your normal dose and call your health care provider to check in.   Do not stop taking your medicine on your own.   Tell your health care provider before you start taking any new medicine, vitamin, or herbal product. Some of these could interfere with your therapy.   Tell all of your health care providers that you are on anticoagulant therapy.   Do not have surgery, medical procedures, or dental work until you tell your health care provider that you are on anticoagulant therapy.  WHAT CAN AFFECT HOW ANTICOAGULANTS WORK?  Certain foods, vitamins, medicines, supplements, and herbal   medicines change the way that anticoagulant therapy works. They may increase or decrease the effects of your anticoagulant therapy. Either result can be dangerous for you.   Many over-the-counter medicines for pain, colds, or stomach problems interfere with anticoagulant therapy. Take these only as told by your health care provider.   Do not drink alcohol. It can interfere with your medicine and increase your risk of an injury that causes bleeding.   If you are taking warfarin, do not begin eating more foods that contain vitamin K. These include  leafy green vegetables. Ask your health care provider if you should avoid any foods.  WHAT ARE SOME WAYS TO PREVENT BLEEDING?  You can prevent bleeding by taking certain precautions:   Be extra careful when you use knives, scissors, or other sharp objects.   Use an electric razor instead of a blade.   Do not use toothpicks.   Use a soft toothbrush.   Wear shoes that have nonskid soles.   Use bath mats and handrails in your bathroom.   Wear gloves while you do yard work.   Wear a helmet when you ride a bike.   Wear your seat belt.   Prevent falls by removing loose rugs and extension cords from areas where you walk.   Do not play contact sports or participate in other activities that have a high risk of injury.  WHEN SHOULD I CONTACT MY HEALTH CARE PROVIDER?  Call your health care provider if:   You miss a dose of medicine:  ? And you are not sure what to do.  ? For more than one day.   You have:  ? Menstrual bleeding that is heavier than normal.  ? Blood in your urine.  ? A bloody nose or bleeding gums.  ? Easy bruising.  ? Blood in your stool (feces) or have black and tarry stool.  ? Side effects from your medicine.   You feel weak or dizzy.   You become pregnant.  Seek immediate medical care if:   You have bleeding that will not stop.   You have sudden and severe headache or belly pain.   You vomit or you cough up bright red blood.   You have a severe blow to your head.  WHAT ARE SOME QUESTIONS TO ASK MY HEALTH CARE PROVIDER?   What is the best anticoagulant therapy for my condition?   What side effects should I watch for?   When should I take my medicine? What should I do if I forget to take it?   Will I need to have regular blood tests?   Do I need to change my diet? Are there foods or drinks that I should avoid?   What activities are safe for me?   What should I do if I want to get pregnant?  This information is not intended to replace advice given to you by your health care provider.  Make sure you discuss any questions you have with your health care provider.  Document Released: 12/30/2014 Document Reviewed: 12/30/2014  Elsevier Interactive Patient Education  2017 [employer]

## 2017-12-30 NOTE — Assessment & Plan Note (Signed)
INR continues at goal, 2.6 today.  Continue current Coumadin regimen and return in 6 weeks.

## 2017-12-30 NOTE — Progress Notes (Signed)
BP 109/72   Pulse (!) 56   Temp 98.4 F (36.9 C) (Oral)   Ht 5\' 11"  (1.803 m)   Wt 196 lb 9.6 oz (89.2 kg)   SpO2 98%   BMI 27.42 kg/m    Subjective:    Patient ID: Dwayne Jacobson, male    DOB: 11/01/1955, 62 y.o.   MRN: [id number]  HPI: Dwayne Jacobson is a 62 y.o. male presents for INR check  Chief Complaint  Patient presents with  . DVT    pt states he is currently taking 1 mg 6 days per week and 2 mg one day per week    DVT HISTORY: Patient long term Coumadin use.  Currently taking 1 MG six days a week and then 2 MG on Wednesdays.  He reports this has been his long term regimen.  He is very structured with diet and monitors what he eats while on Coumadin.  No recent bleeding episodes, blood in stool, or increased bruising.    Relevant past medical, surgical, family and social history reviewed and updated as indicated. Interim medical history since our last visit reviewed. Allergies and medications reviewed and updated.  Review of Systems  Constitutional: Negative for activity change, diaphoresis, fatigue and fever.  Respiratory: Negative for cough, chest tightness, shortness of breath and wheezing.   Cardiovascular: Negative for chest pain, palpitations and leg swelling.  Gastrointestinal: Negative for abdominal distention, abdominal pain, constipation, diarrhea, nausea and vomiting.  Endocrine: Negative for cold intolerance, heat intolerance, polydipsia, polyphagia and polyuria.  Musculoskeletal: Negative.   Skin: Negative.   Neurological: Negative for dizziness, syncope, weakness, light-headedness, numbness and headaches.  Hematological: Negative.   Psychiatric/Behavioral: Negative.     Per HPI unless specifically indicated above     Objective:    BP 109/72   Pulse (!) 56   Temp 98.4 F (36.9 C) (Oral)   Ht 5\' 11"  (1.803 m)   Wt 196 lb 9.6 oz (89.2 kg)   SpO2 98%   BMI 27.42 kg/m   Wt Readings from Last 3 Encounters:  12/30/17 196 lb 9.6 oz (89.2 kg)    12/23/17 197 lb 3.2 oz (89.4 kg)  12/02/17 201 lb (91.2 kg)    Physical Exam  Constitutional: He is oriented to person, place, and time. He appears well-developed and well-nourished.  HENT:  Head: Normocephalic and atraumatic.  Right Ear: Hearing normal. No drainage.  Left Ear: Hearing normal. No drainage.  Mouth/Throat: Uvula is midline and mucous membranes are normal.  Eyes: Pupils are equal, round, and reactive to light. Conjunctivae, EOM and lids are normal. Right eye exhibits no discharge. Left eye exhibits no discharge.  Neck: Trachea normal and normal range of motion. Neck supple. No JVD present. Carotid bruit is not present. No thyromegaly present.  Cardiovascular: Normal rate, regular rhythm, S1 normal, S2 normal and normal heart sounds. Exam reveals no gallop.  No murmur heard. Pulmonary/Chest: Effort normal and breath sounds normal.  Abdominal: Soft. Bowel sounds are normal. There is no splenomegaly or hepatomegaly.  Genitourinary: Rectum normal and penis normal.  Musculoskeletal: Normal range of motion. He exhibits no edema.  Neurological: He is alert and oriented to person, place, and time. He has normal reflexes.  Skin: Skin is warm, dry and intact. Capillary refill takes less than 2 seconds. No rash noted.  Psychiatric: He has a normal mood and affect. His behavior is normal. Judgment and thought content normal.  Nursing note and vitals reviewed.   Results for  orders placed or performed in visit on 12/02/17  CoaguChek XS/INR [name]  Result Value Ref Range   INR 2.4 (H) 0.9 - 1.1   Prothrombin Time 29.1 sec      Assessment & Plan:   Problem List Items Addressed This Visit      Cardiovascular and Mediastinum   DVT (deep venous thrombosis) ([facility]) - Primary    INR continues at goal, 2.6 today.  Continue current Coumadin regimen and return in 6 weeks.      Relevant Orders   CoaguChek XS/INR [name]       Follow up plan: Return in about 6 weeks (around  02/10/2018) for INR check and HLD.

## 2018-01-06 ENCOUNTER — Ambulatory Visit: Payer: 59 | Admitting: Nurse Practitioner

## 2018-01-06 ENCOUNTER — Encounter: Payer: Self-pay | Admitting: Nurse Practitioner

## 2018-01-06 DIAGNOSIS — M7542 Impingement syndrome of left shoulder: Secondary | ICD-10-CM | POA: Diagnosis not present

## 2018-01-06 MED ORDER — CYCLOBENZAPRINE HCL 5 MG PO TABS: 5.0000 mg | ORAL_TABLET | Freq: Two times a day (BID) | ORAL | 0 refills | Status: DC | PRN

## 2018-01-06 NOTE — Progress Notes (Signed)
BP 128/76   Pulse 60   Temp 98.1 F (36.7 C) (Oral)   Wt 195 lb (88.5 kg)   SpO2 100%   BMI 27.20 kg/m    Subjective:    Patient ID: Dwayne Jacobson, male    DOB: [birth year], 62 y.o.   MRN: [id number]  HPI: Dwayne Jacobson is a 62 y.o. male presents for acute shoulder and wrist pain  Chief Complaint  Patient presents with  . Arm Pain    From shoulder to wrist. Ongoing since Sunday. Felt like it started out w/ a charlie horse.    LEFT Shoulder and Wrist Pain: Pain presented on Sunday, felt like "charlie horse" in left shoulder.  Pain then started radiating downwards and "tips of finger are now numb".  He does not recall any event that initiated pain.   Can not recall any vigorous activity over weekend. Does not lie on left side prominently while in bed, reports he moves back and forth.  Has had three to four episodes in past of pain left arm, felt "like falling asleep" only more intense, wore a brace for those episodes and it improved.  He reports this pain is different and brace has not been helping.  He has tried Tylenol at home, but it did not improve pain.  Has been placing heat to shoulder, reports it has not been helping.  States pain is worse at night and can not sleep, throbbing/aching with numbness to finger tips.  States at worst is 8/10 and best 2/10.   States this is affecting his daily ADLs.  Activity makes discomfort worse and rest improves it.   Relevant past medical, surgical, family and social history reviewed and updated as indicated. Interim medical history since our last visit reviewed. Allergies and medications reviewed and updated.  Review of Systems  Constitutional: Negative for activity change, diaphoresis, fatigue and fever.  Respiratory: Negative for cough, chest tightness, shortness of breath and wheezing.   Cardiovascular: Negative for chest pain, palpitations and leg swelling.  Gastrointestinal: Negative for abdominal distention, abdominal pain,  constipation, diarrhea, nausea and vomiting.  Endocrine: Negative for cold intolerance, heat intolerance, polydipsia, polyphagia and polyuria.  Musculoskeletal: Negative for arthralgias, back pain and neck pain.       Left shoulder pain with numbness to finger tips  Skin: Negative.   Neurological: Negative for dizziness, syncope, weakness, light-headedness, numbness and headaches.  Psychiatric/Behavioral: Negative.     Per HPI unless specifically indicated above     Objective:    BP 128/76   Pulse 60   Temp 98.1 F (36.7 C) (Oral)   Wt 195 lb (88.5 kg)   SpO2 100%   BMI 27.20 kg/m   Wt Readings from Last 3 Encounters:  01/06/18 195 lb (88.5 kg)  12/30/17 196 lb 9.6 oz (89.2 kg)  12/23/17 197 lb 3.2 oz (89.4 kg)    Physical Exam  Constitutional: He is oriented to person, place, and time. He appears well-developed and well-nourished.  HENT:  Head: Normocephalic and atraumatic.  Right Ear: Hearing normal. No drainage.  Left Ear: Hearing normal. No drainage.  Mouth/Throat: Uvula is midline and mucous membranes are normal.  Eyes: Pupils are equal, round, and reactive to light. Conjunctivae, EOM and lids are normal. Right eye exhibits no discharge. Left eye exhibits no discharge.  Neck: Trachea normal and normal range of motion. Neck supple. No JVD present. Carotid bruit is not present. No thyromegaly present.  Cardiovascular: Normal rate, regular rhythm, S1  normal, S2 normal and normal heart sounds. Exam reveals no gallop.  No murmur heard. Pulmonary/Chest: Effort normal and breath sounds normal.  Abdominal: Soft. Bowel sounds are normal. There is no splenomegaly or hepatomegaly.  Musculoskeletal: Normal range of motion.       Right shoulder: He exhibits normal range of motion, no tenderness, no swelling, no deformity and normal strength.       Left shoulder: He exhibits normal range of motion, no tenderness, no deformity, no pain and normal strength.  Negative empty can  bilaterally.  Reported mild tenderness with flexion of arm passively. Positive Neer and Hawkins testing to left shoulder, negative right.  Full AROM bilaterally without grimacing or report pain.    Neurological: He is alert and oriented to person, place, and time. He has normal reflexes.  Skin: Skin is warm, dry and intact. Capillary refill takes less than 2 seconds. No rash noted.  Psychiatric: He has a normal mood and affect. His behavior is normal. Judgment and thought content normal.  Nursing note and vitals reviewed.   Results for orders placed or performed in visit on 12/30/17  CoaguChek XS/INR [name]  Result Value Ref Range   INR 2.6 (H) 0.9 - 1.1   Prothrombin Time 30.8 sec      Assessment & Plan:   Problem List Items Addressed This Visit      Other   Shoulder impingement syndrome, left    Due to age and Coumadin unable to use NSAID.  Script sent for Flexeril 5MG  BID PRN, which may assist with sleep/pain at HS. Referral for physical therapy sent . Recommended ice placement at home and gentle stretching + rest.        Relevant Orders   Ambulatory referral to Physical Therapy       Follow up plan: Return if symptoms worsen or fail to improve.

## 2018-01-06 NOTE — Patient Instructions (Signed)
Shoulder Impingement Syndrome Shoulder impingement syndrome is a condition that causes pain when connective tissues (tendons) surrounding the shoulder joint become pinched. These tendons are part of the group of muscles and tissues that help to stabilize the shoulder (rotator cuff). Beneath the rotator cuff is a fluid-filled sac (bursa) that allows the muscles and tendons to glide smoothly. The bursa may become swollen or irritated (bursitis). Bursitis, swelling in the rotator cuff tendons, or both conditions can decrease how much space is under a bone in the shoulder joint (acromion), resulting in impingement. What are the causes? Shoulder impingement syndrome can be caused by bursitis or swelling of the rotator cuff tendons, which may result from:  Repetitive overhead arm movements.  Falling onto the shoulder.  Weakness in the shoulder muscles.  What increases the risk? You may be more likely to develop this condition if you are an athlete who participates in:  Sports that involve throwing, such as baseball.  Tennis.  Swimming.  Volleyball.  Some people are also more likely to develop impingement syndrome because of the shape of their acromion bone. What are the signs or symptoms? The main symptom of this condition is pain on the front or side of the shoulder. Pain may:  Get worse when lifting or raising the arm.  Get worse at night.  Wake you up from sleeping.  Feel sharp when the shoulder is moved, and then fade to an ache.  Other signs and symptoms may include:  Tenderness.  Stiffness.  Inability to raise the arm above shoulder level or behind the body.  Weakness.  How is this diagnosed? This condition may be diagnosed based on:  Your symptoms.  Your medical history.  A physical exam.  Imaging tests, such as: ? X-rays. ? MRI. ? Ultrasound.  How is this treated? Treatment for this condition may include:  Resting your shoulder and avoiding all  activities that cause pain or put stress on the shoulder.  Icing your shoulder.  NSAIDs to help reduce pain and swelling.  One or more injections of medicines to numb the area and reduce inflammation.  Physical therapy.  Surgery. This may be needed if nonsurgical treatments have not helped. Surgery may involve repairing the rotator cuff, reshaping the acromion, or removing the bursa.  Follow these instructions at home: Managing pain, stiffness, and swelling  If directed, apply ice to the injured area. ? Put ice in a plastic bag. ? Place a towel between your skin and the bag. ? Leave the ice on for 20 minutes, 2-3 times a day. Activity  Rest and return to your normal activities as told by your health care provider. Ask your health care provider what activities are safe for you.  Do exercises as told by your health care provider. General instructions  Do not use any tobacco products, including cigarettes, chewing tobacco, or e-cigarettes. Tobacco can delay healing. If you need help quitting, ask your health care provider.  Ask your health care provider when it is safe for you to drive.  Take over-the-counter and prescription medicines only as told by your health care provider.  Keep all follow-up visits as told by your health care provider. This is important. How is this prevented?  Give your body time to rest between periods of activity.  Be safe and responsible while being active to avoid falls.  Maintain physical fitness, including strength and flexibility. Contact a health care provider if:  Your symptoms have not improved after 1-2 months of treatment and   rest.  You cannot lift your arm away from your body. This information is not intended to replace advice given to you by your health care provider. Make sure you discuss any questions you have with your health care provider. Document Released: 01/18/2005 Document Revised: 09/25/2015 Document Reviewed:  12/21/2014 Elsevier Interactive Patient Education  2018 Elsevier Inc.  

## 2018-01-06 NOTE — Assessment & Plan Note (Signed)
Due to age and Coumadin unable to use NSAID.  Script sent for Flexeril 5MG  BID PRN, which may assist with sleep/pain at HS. Referral for physical therapy sent . Recommended ice placement at home and gentle stretching + rest.

## 2018-01-10 ENCOUNTER — Telehealth: Payer: Self-pay | Admitting: Nurse Practitioner

## 2018-01-10 MED ORDER — GABAPENTIN 300 MG PO CAPS: 300.0000 mg | ORAL_CAPSULE | Freq: Every day | ORAL | 0 refills | Status: DC

## 2018-01-10 NOTE — Telephone Encounter (Signed)
Spoke to Dwayne Jacobson via telephone.  His wife and him report pt has ongoing pain to shoulder, impingement.  Mostly noted worse at HS and Flexeril is not helping, patient is not sleeping well.  He did attend first PT session today, which he states helped until he had to return to work and do repetitive movement.  He is requesting Gabapentin.  Discussed with him to stop taking Flexeril, as not offering benefit.  Will send script for Gabapentin 300 MG QHS for discomfort and impingement pain to pharmacy.  Current CrCl 122.58.  #30 pills with no refills sent.  If continued discomfort and no benefit from PT, then will sent to ortho for further evaluation.  He was able to verbalize back this plan of care to provider.

## 2018-02-05 ENCOUNTER — Other Ambulatory Visit: Payer: Self-pay | Admitting: Nurse Practitioner

## 2018-02-06 NOTE — Telephone Encounter (Signed)
Requested Prescriptions  Pending Prescriptions Disp Refills  . 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

## 2018-02-10 ENCOUNTER — Ambulatory Visit (INDEPENDENT_AMBULATORY_CARE_PROVIDER_SITE_OTHER): Payer: 59 | Admitting: Nurse Practitioner

## 2018-02-10 ENCOUNTER — Encounter: Payer: Self-pay | Admitting: Nurse Practitioner

## 2018-02-10 VITALS — BP 124/73 | HR 57 | Temp 97.6°F | Ht 71.0 in | Wt 199.3 lb

## 2018-02-10 DIAGNOSIS — I82519 Chronic embolism and thrombosis of unspecified femoral vein: Secondary | ICD-10-CM | POA: Diagnosis not present

## 2018-02-10 DIAGNOSIS — Z7901 Long term (current) use of anticoagulants: Secondary | ICD-10-CM

## 2018-02-10 DIAGNOSIS — Z5181 Encounter for therapeutic drug level monitoring: Secondary | ICD-10-CM

## 2018-02-10 DIAGNOSIS — E782 Mixed hyperlipidemia: Secondary | ICD-10-CM | POA: Diagnosis not present

## 2018-02-10 LAB — LIPID PANEL PICCOLO, WAIVED
Chol/HDL Ratio Piccolo,Waive: 3.2 mg/dL
Cholesterol Piccolo, Waived: 161 mg/dL (ref <200)
HDL Chol Piccolo, Waived: 50 mg/dL — ABNORMAL LOW (ref >59)
LDL Chol Calc Piccolo Waived: 88 mg/dL (ref <100)
Triglycerides Piccolo,Waived: 110 mg/dL (ref <150)
VLDL Chol Calc Piccolo,Waive: 22 mg/dL (ref <30)

## 2018-02-10 LAB — COAGUCHEK XS/INR WAIVED
INR: 2.1 — AB (ref 0.9–1.1)
Prothrombin Time: 25.8 s

## 2018-02-10 NOTE — Assessment & Plan Note (Signed)
INR at goal 2.1, previous 2.6.  Patient is very controlled with Coumadin and regimen.  Continue current regimen.

## 2018-02-10 NOTE — Assessment & Plan Note (Signed)
INR is 2.1.  Continue current dose.   1 MG 6 days a week and 2 MG one day

## 2018-02-10 NOTE — Assessment & Plan Note (Addendum)
Chronic, stable.  Continue current regimen.  LDL 88 and TCHOL 161 with AST/ALT 32/36 today.

## 2018-02-10 NOTE — Patient Instructions (Signed)
Bleeding Precautions When on Anticoagulant Therapy, Adult Anticoagulant therapy, also called blood thinner therapy, is medicine that helps to prevent and treat blood clots. The medicine works by stopping blood clots from forming or growing. Blood clots that form in your blood vessels can be dangerous. They can break loose and travel to the heart, lungs, or brain. This increases the risk of a heart attack, stroke, or blocked lung artery (pulmonary embolism). Anticoagulants also increase the risk of bleeding. Try to protect yourself from cuts and other injuries that can cause bleeding. It is important to take anticoagulants exactly as told by your health care provider. Why do I need to be on anticoagulant therapy? You may need this medicine if you are at risk of developing a blood clot. Conditions that increase your risk of a blood clot include:  Being born with heart disease or a heart malformation (congenital heart disease).  Developing heart disease.  Having had surgery, such as valve replacement.  Having had a serious accident or other type of severe injury (trauma).  Having certain types of cancer.  Having certain diseases that can increase blood clotting.  Having a high risk of stroke or heart attack.  Having atrial fibrillation (AF). What are the common anticoagulant medicines? There are several types of anticoagulant medicines. The most common types are:  Medicines that you take by mouth (oral medicines), such as: ? Warfarin. ? Novel oral anticoagulants (NOACs), such as: ? Direct thrombin inhibitors (dabigatran). ? Factor Xa inhibitors (apixaban, edoxaban, and rivaroxaban).  Injections, such as: ? Unfractionated heparin. ? Low molecular weight heparin. These anticoagulants work in different ways to prevent blood clots. They also have different risks and side effects. What do I need to remember while on anticoagulant therapy? Taking anticoagulants  Take your medicine at the  same time every day. If you forget to take your medicine, take it as soon as you remember. Do not double your dosage of medicine if you miss a whole day. Take your normal dose and call your health care provider.  Do not stop taking your medicine unless your health care provider approves. Stopping the medicine can increase your risk of developing a blood clot. Taking other medicines  Take over-the-counter and prescriptions medicines only as told by your health care provider.  Do not take over-the-counter NSAIDs, including aspirin and ibuprofen, while you are on anticoagulant therapy. These medicines increase your risk of dangerous bleeding.  Get approval from your health care provider before you start taking any new medicines, vitamins, or herbal products. Some of these could interfere with your therapy. General instructions  Keep all follow-up visits as told by your health care provider. This is important.  If you are pregnant or trying to get pregnant, talk with a health care provider about anticoagulants. Some of these medicines are not safe to take during pregnancy.  Tell all health care providers, including your dentist, that you are on anticoagulant therapy. It is especially important to tell providers before you have any surgery, medical procedures, or dental work done. What precautions should I take?   Be very careful when using knives, scissors, or other sharp objects.  Use an electric razor instead of a blade.  Do not use toothpicks.  Use a soft-bristled toothbrush. Brush your teeth gently.  Always wear shoes outdoors and wear slippers indoors.  Be careful when cutting your fingernails and toenails.  Place bath mats in the bathroom. If possible, install handrails as well.  Wear gloves while you do   yard work.  Wear your seat belt.  Prevent falls by removing loose rugs and extension cords from areas where you walk. Use a cane or walker if you need it.  Avoid  constipation by: ? Drinking enough fluid to keep your urine clear or pale yellow. ? Eating foods that are high in fiber, such as fresh fruits and vegetables, whole grains, and beans. ? Limiting foods that are high in fat and processed sugars, such as fried and sweet foods.  Do not play contact sports or participate in other activities that have a high risk for injury. What other precautions are important if on warfarin therapy? If you are taking a type of anticoagulant called warfarin, make sure you:  Work with a diet and nutrition specialist (dietitian) to make an eating plan. Do not make any sudden changes to your diet after you have started your eating plan.  Do not drink alcohol. It can interfere with your medicine and increase your risk of an injury that causes bleeding.  Get regular blood tests as told by your health care provider. What are some questions to ask my health care provider?  Why do I need anticoagulant therapy?  What is the best anticoagulant therapy for my condition?  How long will I need anticoagulant therapy?  What are the side effects of anticoagulant therapy?  When should I take my medicine? What should I do if I forget to take it?  Will I need to have regular blood tests?  Do I need to change my diet? Are there foods or drinks that I should avoid?  What activities are safe for me?  What should I do if I want to get pregnant? Contact a health care provider if:  You miss a dose of medicine: ? And you are not sure what to do. ? For more than one day.  You have: ? Menstrual bleeding that is heavier than normal. ? Bloody or brown urine. ? Easy bruising. ? Black and tarry stool or bright red stool. ? Side effects from your medicine.  You feel weak or dizzy.  You become pregnant. Get help right away if:  You have bleeding that will not stop within 20 minutes from: ? The nose. ? The gums. ? A cut on the skin.  You have a severe headache or  stomachache.  You vomit or cough up blood.  You fall or hit your head. Summary  Anticoagulant therapy, also called blood thinner therapy, is medicine that helps to prevent and treat blood clots.  Anticoagulants work in different ways to prevent blood clots. They also have different risks and side effects.  Talk with your health care provider about any precautions that you should take while on anticoagulant therapy. This information is not intended to replace advice given to you by your health care provider. Make sure you discuss any questions you have with your health care provider. Document Released: 12/30/2014 Document Revised: 04/06/2016 Document Reviewed: 04/06/2016 Elsevier Interactive Patient Education  2019 Elsevier Inc.  

## 2018-02-10 NOTE — Progress Notes (Signed)
   BP 124/73 (BP Location: Left Arm, Patient Position: Sitting, Cuff Size: Normal)   Pulse (!) 57   Temp 97.6 F (36.4 C) (Oral)   Ht 5\' 11"  (1.803 m)   Wt 199 lb 4.8 oz (90.4 kg)   SpO2 96%   BMI 27.80 kg/m    Subjective:    Patient ID: Dwayne Jacobson, male    DOB: 1955-06-03, 63 y.o.   MRN: [id number]  CC: Coumadin management  HPI: This patient is a 63 y.o. male who presents for coumadin management. 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  Present Coumadin dose: Goal: 2.0-3.0 The patient does not have an active anticoagulation episode. Excessive bruising: no Nose bleeding: no Rectal bleeding: no Eating diet with consistent amounts of foods containing Vitamin K:no Any recent antibiotic use? no   HYPERLIPIDEMIA Continues on Atorvastatin once a day. Hyperlipidemia status: excellent compliance Satisfied with current treatment?  no Side effects:  no Medication compliance: excellent compliance Aspirin:  no The 10-year ASCVD risk score Mikey Bussing DC Jr., et al., 2013) is: 14.4%   Values used to calculate the score:     Age: 89 years     Sex: Male     Is Non-Hispanic African American: No     Diabetic: No     Tobacco smoker: Yes     Systolic Blood Pressure: 588 mmHg     Is BP treated: Yes     HDL Cholesterol: 44 mg/dL     Total Cholesterol: 150 mg/dL Chest pain:  no Coronary artery disease:  no Family history CAD:  no Family history early CAD:  no  Relevant past medical, surgical, family and social history reviewed and updated as indicated. Interim medical history since our last visit reviewed. Allergies and medications reviewed and updated.  ROS: Per HPI unless specifically indicated above     Objective:    BP 124/73 (BP Location: Left Arm, Patient Position: Sitting, Cuff Size: Normal)   Pulse (!) 57   Temp 97.6 F (36.4 C) (Oral)   Ht 5\' 11"  (1.803 m)   Wt 199 lb 4.8 oz (90.4 kg)   SpO2 96%   BMI 27.80  kg/m   Wt Readings from Last 3 Encounters:  02/10/18 199 lb 4.8 oz (90.4 kg)  01/06/18 195 lb (88.5 kg)  12/30/17 196 lb 9.6 oz (89.2 kg)     General: Well appearing, well nourished in no distress.  Normal mood and affect. Skin: No excessive bruising or rash  Last INR:     Last CBC:  Lab Results  Component Value Date   WBC 5.4 09/30/2017   HGB 13.7 09/30/2017   HCT 39.7 09/30/2017   MCV 89 09/30/2017   PLT 333 09/30/2017    Results for orders placed or performed in visit on 12/30/17  CoaguChek XS/INR [name]  Result Value Ref Range   INR 2.6 (H) 0.9 - 1.1   Prothrombin Time 30.8 sec       Assessment:     ICD-10-CM   1. Encounter for monitoring Coumadin therapy Z51.81 CoaguChek XS/INR [name]   Z79.01   2. Mixed hyperlipidemia E78.2 Lipid Panel [name], [name]  3. 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 For coumadin dosing, elected to continue current dose. Will plan to recheck INR in 1 month.

## 2018-03-10 ENCOUNTER — Encounter: Payer: Self-pay | Admitting: Nurse Practitioner

## 2018-03-10 ENCOUNTER — Ambulatory Visit: Payer: 59 | Admitting: Nurse Practitioner

## 2018-03-10 VITALS — BP 123/73 | HR 61 | Temp 97.5°F | Ht 71.5 in | Wt 203.2 lb

## 2018-03-10 DIAGNOSIS — I82519 Chronic embolism and thrombosis of unspecified femoral vein: Secondary | ICD-10-CM | POA: Diagnosis not present

## 2018-03-10 DIAGNOSIS — Z7901 Long term (current) use of anticoagulants: Secondary | ICD-10-CM | POA: Diagnosis not present

## 2018-03-10 DIAGNOSIS — Z5181 Encounter for therapeutic drug level monitoring: Secondary | ICD-10-CM

## 2018-03-10 LAB — COAGUCHEK XS/INR WAIVED
INR: 2.9 — ABNORMAL HIGH (ref 0.9–1.1)
Prothrombin Time: 24.6 s

## 2018-03-10 NOTE — Patient Instructions (Signed)
Bleeding Precautions When on Anticoagulant Therapy, Adult Anticoagulant therapy, also called blood thinner therapy, is medicine that helps to prevent and treat blood clots. The medicine works by stopping blood clots from forming or growing. Blood clots that form in your blood vessels can be dangerous. They can break loose and travel to the heart, lungs, or brain. This increases the risk of a heart attack, stroke, or blocked lung artery (pulmonary embolism). Anticoagulants also increase the risk of bleeding. Try to protect yourself from cuts and other injuries that can cause bleeding. It is important to take anticoagulants exactly as told by your health care provider. Why do I need to be on anticoagulant therapy? You may need this medicine if you are at risk of developing a blood clot. Conditions that increase your risk of a blood clot include:  Being born with heart disease or a heart malformation (congenital heart disease).  Developing heart disease.  Having had surgery, such as valve replacement.  Having had a serious accident or other type of severe injury (trauma).  Having certain types of cancer.  Having certain diseases that can increase blood clotting.  Having a high risk of stroke or heart attack.  Having atrial fibrillation (AF). What are the common anticoagulant medicines? There are several types of anticoagulant medicines. The most common types are:  Medicines that you take by mouth (oral medicines), such as: ? Warfarin. ? Novel oral anticoagulants (NOACs), such as: ? Direct thrombin inhibitors (dabigatran). ? Factor Xa inhibitors (apixaban, edoxaban, and rivaroxaban).  Injections, such as: ? Unfractionated heparin. ? Low molecular weight heparin. These anticoagulants work in different ways to prevent blood clots. They also have different risks and side effects. What do I need to remember while on anticoagulant therapy? Taking anticoagulants  Take your medicine at the  same time every day. If you forget to take your medicine, take it as soon as you remember. Do not double your dosage of medicine if you miss a whole day. Take your normal dose and call your health care provider.  Do not stop taking your medicine unless your health care provider approves. Stopping the medicine can increase your risk of developing a blood clot. Taking other medicines  Take over-the-counter and prescriptions medicines only as told by your health care provider.  Do not take over-the-counter NSAIDs, including aspirin and ibuprofen, while you are on anticoagulant therapy. These medicines increase your risk of dangerous bleeding.  Get approval from your health care provider before you start taking any new medicines, vitamins, or herbal products. Some of these could interfere with your therapy. General instructions  Keep all follow-up visits as told by your health care provider. This is important.  If you are pregnant or trying to get pregnant, talk with a health care provider about anticoagulants. Some of these medicines are not safe to take during pregnancy.  Tell all health care providers, including your dentist, that you are on anticoagulant therapy. It is especially important to tell providers before you have any surgery, medical procedures, or dental work done. What precautions should I take?   Be very careful when using knives, scissors, or other sharp objects.  Use an electric razor instead of a blade.  Do not use toothpicks.  Use a soft-bristled toothbrush. Brush your teeth gently.  Always wear shoes outdoors and wear slippers indoors.  Be careful when cutting your fingernails and toenails.  Place bath mats in the bathroom. If possible, install handrails as well.  Wear gloves while you do   yard work.  Wear your seat belt.  Prevent falls by removing loose rugs and extension cords from areas where you walk. Use a cane or walker if you need it.  Avoid  constipation by: ? Drinking enough fluid to keep your urine clear or pale yellow. ? Eating foods that are high in fiber, such as fresh fruits and vegetables, whole grains, and beans. ? Limiting foods that are high in fat and processed sugars, such as fried and sweet foods.  Do not play contact sports or participate in other activities that have a high risk for injury. What other precautions are important if on warfarin therapy? If you are taking a type of anticoagulant called warfarin, make sure you:  Work with a diet and nutrition specialist (dietitian) to make an eating plan. Do not make any sudden changes to your diet after you have started your eating plan.  Do not drink alcohol. It can interfere with your medicine and increase your risk of an injury that causes bleeding.  Get regular blood tests as told by your health care provider. What are some questions to ask my health care provider?  Why do I need anticoagulant therapy?  What is the best anticoagulant therapy for my condition?  How long will I need anticoagulant therapy?  What are the side effects of anticoagulant therapy?  When should I take my medicine? What should I do if I forget to take it?  Will I need to have regular blood tests?  Do I need to change my diet? Are there foods or drinks that I should avoid?  What activities are safe for me?  What should I do if I want to get pregnant? Contact a health care provider if:  You miss a dose of medicine: ? And you are not sure what to do. ? For more than one day.  You have: ? Menstrual bleeding that is heavier than normal. ? Bloody or brown urine. ? Easy bruising. ? Black and tarry stool or bright red stool. ? Side effects from your medicine.  You feel weak or dizzy.  You become pregnant. Get help right away if:  You have bleeding that will not stop within 20 minutes from: ? The nose. ? The gums. ? A cut on the skin.  You have a severe headache or  stomachache.  You vomit or cough up blood.  You fall or hit your head. Summary  Anticoagulant therapy, also called blood thinner therapy, is medicine that helps to prevent and treat blood clots.  Anticoagulants work in different ways to prevent blood clots. They also have different risks and side effects.  Talk with your health care provider about any precautions that you should take while on anticoagulant therapy. This information is not intended to replace advice given to you by your health care provider. Make sure you discuss any questions you have with your health care provider. Document Released: 12/30/2014 Document Revised: 04/06/2016 Document Reviewed: 04/06/2016 Elsevier Interactive Patient Education  2019 Elsevier Inc.  

## 2018-03-10 NOTE — Assessment & Plan Note (Signed)
INR today 2.9.  Continue Coumadin 1 MG 6 days a week and 2 MG one day a week.  Recheck in one month.

## 2018-03-10 NOTE — Progress Notes (Signed)
   BP 123/73 (BP Location: Left Arm, Patient Position: Sitting, Cuff Size: Normal)   Pulse 61   Temp (!) 97.5 F (36.4 C) (Oral)   Ht 5' 11.5" (1.816 m)   Wt 203 lb 3.2 oz (92.2 kg)   SpO2 99%   BMI 27.95 kg/m    Subjective:    Patient ID: Dwayne Jacobson, male    DOB: 30-Nov-1955, 63 y.o.   MRN: [id number]  CC: Coumadin management  HPI: This patient is a 63 y.o. male who presents for coumadin management. 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  Has SLE followed by Dr. Estanislado Pandy.  Present Coumadin dose: 1 MG 6 days a week and 2 MG one day a week (has been on this dosing for a long period) Goal: 2.0-3.0 The patient does not have an active anticoagulation episode. Excessive bruising: no Nose bleeding: no Rectal bleeding: no Prolonged menstrual cycles: N/A Eating diet with consistent amounts of foods containing Vitamin K:at times Any recent antibiotic use? no  Relevant past medical, surgical, family and social history reviewed and updated as indicated. Interim medical history since our last visit reviewed. Allergies and medications reviewed and updated.  ROS: Per HPI unless specifically indicated above     Objective:    BP 123/73 (BP Location: Left Arm, Patient Position: Sitting, Cuff Size: Normal)   Pulse 61   Temp (!) 97.5 F (36.4 C) (Oral)   Ht 5' 11.5" (1.816 m)   Wt 203 lb 3.2 oz (92.2 kg)   SpO2 99%   BMI 27.95 kg/m   Wt Readings from Last 3 Encounters:  03/10/18 203 lb 3.2 oz (92.2 kg)  02/10/18 199 lb 4.8 oz (90.4 kg)  01/06/18 195 lb (88.5 kg)     General: Well appearing, well nourished in no distress.  Normal mood and affect. Skin: No excessive bruising or rash  Last INR: 2.1    Last CBC:  Lab Results  Component Value Date   WBC 5.4 09/30/2017   HGB 13.7 09/30/2017   HCT 39.7 09/30/2017   MCV 89 09/30/2017   PLT 333 09/30/2017    Results for orders placed or performed in visit on 02/10/18    CoaguChek XS/INR [name]  Result Value Ref Range   INR 2.1 (H) 0.9 - 1.1   Prothrombin Time 25.8 sec  Lipid Panel [name], [name]  Result Value Ref Range   Cholesterol [name], [name] 161 <200 mg/dL   HDL Chol [name], [name] 50 (L) >59 mg/dL   Triglycerides [name],[name] 110 <150 mg/dL   Chol/HDL Ratio [name],Waive 3.2 mg/dL   LDL Chol Calc [name] [name] 88 <100 mg/dL   VLDL Chol Calc [name],Waive 22 <30 mg/dL       Assessment:     ICD-10-CM   1. Encounter for monitoring Coumadin therapy Z51.81 CoaguChek XS/INR [name]   Z79.01   2. 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 For coumadin dosing, elected to continue current dose. Will plan to recheck INR in 1 month.

## 2018-03-10 NOTE — Assessment & Plan Note (Signed)
INR 2.9, reports he has not ate greens lately.  Will maintain current dose 1 MG 6 days a week and 2 MG one day a week and recheck in one month along with CBC.

## 2018-04-04 ENCOUNTER — Ambulatory Visit: Payer: 59 | Admitting: Nurse Practitioner

## 2018-04-04 ENCOUNTER — Encounter: Payer: Self-pay | Admitting: Nurse Practitioner

## 2018-04-04 VITALS — BP 130/75 | HR 65 | Temp 97.9°F | Ht 71.5 in | Wt 200.2 lb

## 2018-04-04 DIAGNOSIS — Z5181 Encounter for therapeutic drug level monitoring: Secondary | ICD-10-CM | POA: Diagnosis not present

## 2018-04-04 DIAGNOSIS — Z7901 Long term (current) use of anticoagulants: Secondary | ICD-10-CM | POA: Diagnosis not present

## 2018-04-04 LAB — COAGUCHEK XS/INR WAIVED
INR: 2.6 — ABNORMAL HIGH (ref 0.9–1.1)
Prothrombin Time: 30.6 s

## 2018-04-04 NOTE — Patient Instructions (Signed)
Prothrombin Time, International Normalized Ratio Test Why am I having this test? A prothrombin time (pro-time, PT) test may be ordered if:  You have certain medical conditions that cause abnormal bleeding or blood clotting. These can include: ? Liver disease. ? Systemic infection (sepsis). ? Inherited (genetic) bleeding disorders.  You are taking a medicine to prevent excessive blood clotting (anticoagulant), such as warfarin. ? If you are taking warfarin, you will likely be asked to have this test done at regular intervals. The results of this test will help your health care provider determine what dose of warfarin you need based on how quickly or slowly your blood clots. It is very important to have this test done as often as your health care provider recommends. What is being tested? A prothrombin time (pro-time, PT) test measures how many seconds it takes your blood to clot. The international normalized ratio (INR) is a calculation of blood clotting time based on your PT result. Most labs report both PT and INR values when reporting blood clotting times. What kind of sample is taken?  A blood sample is required for this test. It is usually collected by inserting a needle into a blood vessel. Tell a health care provider about:  Any blood disorders you have.  All medicines you are taking, including vitamins, herbs, eye drops, creams, and over-the-counter medicines. Do not stop, add, or change any medicines without letting your health care provider know.  The foods you regularly eat, especially foods that contain moderate or high amounts of vitamin K. It is important to eat a consistent amount of foods rich in vitamin K. Let your health care provider know if you have recently changed your diet.  If you drink alcohol. This can affect your lab results. How are the results reported? Your test results will be reported as values. Your health care provider will compare your results to normal  ranges that were established after testing a large group of people (reference ranges). Reference ranges may vary among different labs and hospitals. For this test, common reference ranges are:  Without anticoagulant treatment (control value): 11.0-12.5 seconds; 85-100%.  INR: 0.8-1.1. If you are taking warfarin, talk with your health care provider about what your INR result should be. Generally, an INR of 2.0-3.0 is desired for blood clot prevention. This depends on your medical conditions. What do the results mean?  A higher than normal PT or INR means that your blood takes longer to form a clot. This can result from: ? Certain medicines. ? Liver disease. ? Lack of certain proteins that form clots (coagulation factors). ? Lack of some vitamins.  A lower than normal PT or INR means that your blood can form a clot easily. This can result from: ? Supplements that contain Vitamin K. ? Medicines that contain estrogen, such as birth control pills or hormone replacement. ? Cancer. ? Some blood disorders (disseminated intravascular coagulation). Talk with your health care provider about what your test results mean. If you are taking warfarin or another anticoagulant, your result ranges may be different. Talk to your health care provider about what your results should be. Questions to ask your health care provider Ask your health care provider or the department that is doing the test:  When will my results be ready?  How will I get my results?  What are my treatment options?  What other tests do I need?  What are my next steps? Summary  A prothrombin time (pro-time, PT) test measures how   many seconds it takes your blood to clot.  You may have this test if you have a medical condition that causes abnormal bleeding or blood clotting, or if you are taking a medicine to prevent abnormal blood clotting.  A test result that is higher than normal indicates that your blood is taking too long  to form a clot. This result may occur because you lack some vitamins, take certain medicines, or have certain medical conditions.  Talk with your health care provider about what your results mean. This information is not intended to replace advice given to you by your health care provider. Make sure you discuss any questions you have with your health care provider. Document Released: 02/21/2004 Document Revised: 03/05/2017 Document Reviewed: 03/05/2017 Elsevier Interactive Patient Education  2019 [employer]  

## 2018-04-04 NOTE — Progress Notes (Signed)
   BP 130/75 (BP Location: Left Arm, Patient Position: Sitting, Cuff Size: Normal)   Pulse 65   Temp 97.9 F (36.6 C)   Ht 5' 11.5" (1.816 m)   Wt 200 lb 3 oz (90.8 kg)   SpO2 98%   BMI 27.53 kg/m    Subjective:    Patient ID: Dwayne Jacobson, male    DOB: 04/24/1955, 63 y.o.   MRN: [id number]  CC: Coumadin management  HPI: This patient is a 63 y.o. male who presents for coumadin management. 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 Has SLE followed by Dr. Estanislado Pandy.  INR today 2.6.  Present Coumadin dose: 1 MG 6 days a week and 2 MG one day a week (has been on this dosing for long period) Goal: 2.0-3.0 The patient does not have an active anticoagulation episode. Excessive bruising: no Nose bleeding: no Rectal bleeding: no Prolonged menstrual cycles: N/A Eating diet with consistent amounts of foods containing Vitamin K:yes Any recent antibiotic use? no  Relevant past medical, surgical, family and social history reviewed and updated as indicated. Interim medical history since our last visit reviewed. Allergies and medications reviewed and updated.  ROS: Per HPI unless specifically indicated above     Objective:    BP 130/75 (BP Location: Left Arm, Patient Position: Sitting, Cuff Size: Normal)   Pulse 65   Temp 97.9 F (36.6 C)   Ht 5' 11.5" (1.816 m)   Wt 200 lb 3 oz (90.8 kg)   SpO2 98%   BMI 27.53 kg/m   Wt Readings from Last 3 Encounters:  04/04/18 200 lb 3 oz (90.8 kg)  03/10/18 203 lb 3.2 oz (92.2 kg)  02/10/18 199 lb 4.8 oz (90.4 kg)     General: Well appearing, well nourished in no distress.  Normal mood and affect. Skin: No excessive bruising or rash  Last INR: 2.9    Last CBC:  Lab Results  Component Value Date   WBC 5.4 09/30/2017   HGB 13.7 09/30/2017   HCT 39.7 09/30/2017   MCV 89 09/30/2017   PLT 333 09/30/2017    Results for orders placed or performed in visit on 03/10/18  CoaguChek  XS/INR [name]  Result Value Ref Range   INR 2.9 (H) 0.9 - 1.1   Prothrombin Time 24.6 sec       Assessment:     ICD-10-CM   1. Encounter for monitoring Coumadin therapy Z51.81 CoaguChek XS/INR [name]   Z79.01     Plan:   Discussed current plan face-to-face with patient. For coumadin dosing, elected to continue current dose. Will plan to recheck INR in 1 month.

## 2018-04-07 ENCOUNTER — Ambulatory Visit: Payer: 59 | Admitting: Nurse Practitioner

## 2018-04-16 ENCOUNTER — Other Ambulatory Visit: Payer: Self-pay | Admitting: Unknown Physician Specialty

## 2018-05-01 ENCOUNTER — Other Ambulatory Visit: Payer: Self-pay | Admitting: Nurse Practitioner

## 2018-05-01 DIAGNOSIS — Z7901 Long term (current) use of anticoagulants: Principal | ICD-10-CM

## 2018-05-01 DIAGNOSIS — Z5181 Encounter for therapeutic drug level monitoring: Secondary | ICD-10-CM

## 2018-05-01 NOTE — Progress Notes (Signed)
Future INR labs ordered

## 2018-05-05 ENCOUNTER — Encounter: Payer: Self-pay | Admitting: Nurse Practitioner

## 2018-05-05 ENCOUNTER — Other Ambulatory Visit: Payer: Self-pay

## 2018-05-05 ENCOUNTER — Ambulatory Visit (INDEPENDENT_AMBULATORY_CARE_PROVIDER_SITE_OTHER): Payer: 59 | Admitting: Nurse Practitioner

## 2018-05-05 VITALS — BP 108/67 | HR 63 | Temp 97.9°F | Ht 71.0 in | Wt 195.0 lb

## 2018-05-05 DIAGNOSIS — Z7901 Long term (current) use of anticoagulants: Principal | ICD-10-CM

## 2018-05-05 DIAGNOSIS — Z5181 Encounter for therapeutic drug level monitoring: Secondary | ICD-10-CM

## 2018-05-05 DIAGNOSIS — I82519 Chronic embolism and thrombosis of unspecified femoral vein: Secondary | ICD-10-CM | POA: Diagnosis not present

## 2018-05-05 LAB — COAGUCHEK XS/INR WAIVED
INR: 1.9 — ABNORMAL HIGH (ref 0.9–1.1)
Prothrombin Time: 23 s

## 2018-05-05 NOTE — Assessment & Plan Note (Signed)
Chronic, long term.  INR today 1.9.  Will change dose at this time to 1 MG five days a week and 2 MG two days a week with a repeat of INR in one week, along with CBC.

## 2018-05-05 NOTE — Patient Instructions (Signed)
Bleeding Precautions When on Anticoagulant Therapy, Adult Anticoagulant therapy, also called blood thinner therapy, is medicine that helps to prevent and treat blood clots. The medicine works by stopping blood clots from forming or growing. Blood clots that form in your blood vessels can be dangerous. They can break loose and travel to the heart, lungs, or brain. This increases the risk of a heart attack, stroke, or blocked lung artery (pulmonary embolism). Anticoagulants also increase the risk of bleeding. Try to protect yourself from cuts and other injuries that can cause bleeding. It is important to take anticoagulants exactly as told by your health care provider. Why do I need to be on anticoagulant therapy? You may need this medicine if you are at risk of developing a blood clot. Conditions that increase your risk of a blood clot include:  Being born with heart disease or a heart malformation (congenital heart disease).  Developing heart disease.  Having had surgery, such as valve replacement.  Having had a serious accident or other type of severe injury (trauma).  Having certain types of cancer.  Having certain diseases that can increase blood clotting.  Having a high risk of stroke or heart attack.  Having atrial fibrillation (AF). What are the common anticoagulant medicines? There are several types of anticoagulant medicines. The most common types are:  Medicines that you take by mouth (oral medicines), such as: ? Warfarin. ? Novel oral anticoagulants (NOACs), such as: ? Direct thrombin inhibitors (dabigatran). ? Factor Xa inhibitors (apixaban, edoxaban, and rivaroxaban).  Injections, such as: ? Unfractionated heparin. ? Low molecular weight heparin. These anticoagulants work in different ways to prevent blood clots. They also have different risks and side effects. What do I need to remember while on anticoagulant therapy? Taking anticoagulants  Take your medicine at the  same time every day. If you forget to take your medicine, take it as soon as you remember. Do not double your dosage of medicine if you miss a whole day. Take your normal dose and call your health care provider.  Do not stop taking your medicine unless your health care provider approves. Stopping the medicine can increase your risk of developing a blood clot. Taking other medicines  Take over-the-counter and prescriptions medicines only as told by your health care provider.  Do not take over-the-counter NSAIDs, including aspirin and ibuprofen, while you are on anticoagulant therapy. These medicines increase your risk of dangerous bleeding.  Get approval from your health care provider before you start taking any new medicines, vitamins, or herbal products. Some of these could interfere with your therapy. General instructions  Keep all follow-up visits as told by your health care provider. This is important.  If you are pregnant or trying to get pregnant, talk with a health care provider about anticoagulants. Some of these medicines are not safe to take during pregnancy.  Tell all health care providers, including your dentist, that you are on anticoagulant therapy. It is especially important to tell providers before you have any surgery, medical procedures, or dental work done. What precautions should I take?   Be very careful when using knives, scissors, or other sharp objects.  Use an electric razor instead of a blade.  Do not use toothpicks.  Use a soft-bristled toothbrush. Brush your teeth gently.  Always wear shoes outdoors and wear slippers indoors.  Be careful when cutting your fingernails and toenails.  Place bath mats in the bathroom. If possible, install handrails as well.  Wear gloves while you do  yard work.  Wear your seat belt.  Prevent falls by removing loose rugs and extension cords from areas where you walk. Use a cane or walker if you need it.  Avoid  constipation by: ? Drinking enough fluid to keep your urine clear or pale yellow. ? Eating foods that are high in fiber, such as fresh fruits and vegetables, whole grains, and beans. ? Limiting foods that are high in fat and processed sugars, such as fried and sweet foods.  Do not play contact sports or participate in other activities that have a high risk for injury. What other precautions are important if on warfarin therapy? If you are taking a type of anticoagulant called warfarin, make sure you:  Work with a diet and nutrition specialist (dietitian) to make an eating plan. Do not make any sudden changes to your diet after you have started your eating plan.  Do not drink alcohol. It can interfere with your medicine and increase your risk of an injury that causes bleeding.  Get regular blood tests as told by your health care provider. What are some questions to ask my health care provider?  Why do I need anticoagulant therapy?  What is the best anticoagulant therapy for my condition?  How long will I need anticoagulant therapy?  What are the side effects of anticoagulant therapy?  When should I take my medicine? What should I do if I forget to take it?  Will I need to have regular blood tests?  Do I need to change my diet? Are there foods or drinks that I should avoid?  What activities are safe for me?  What should I do if I want to get pregnant? Contact a health care provider if:  You miss a dose of medicine: ? And you are not sure what to do. ? For more than one day.  You have: ? Menstrual bleeding that is heavier than normal. ? Bloody or brown urine. ? Easy bruising. ? Black and tarry stool or bright red stool. ? Side effects from your medicine.  You feel weak or dizzy.  You become pregnant. Get help right away if:  You have bleeding that will not stop within 20 minutes from: ? The nose. ? The gums. ? A cut on the skin.  You have a severe headache or  stomachache.  You vomit or cough up blood.  You fall or hit your head. Summary  Anticoagulant therapy, also called blood thinner therapy, is medicine that helps to prevent and treat blood clots.  Anticoagulants work in different ways to prevent blood clots. They also have different risks and side effects.  Talk with your health care provider about any precautions that you should take while on anticoagulant therapy. This information is not intended to replace advice given to you by your health care provider. Make sure you discuss any questions you have with your health care provider. Document Released: 12/30/2014 Document Revised: 04/06/2016 Document Reviewed: 04/06/2016 Elsevier Interactive Patient Education  2019 [employer].

## 2018-05-05 NOTE — Assessment & Plan Note (Signed)
INR today 1.9.  Will change dose at this time to 1 MG five days a week and 2 MG two days a week with a repeat of INR in one week, along with CBC.

## 2018-05-05 NOTE — Progress Notes (Signed)
BP 108/67 (BP Location: Right Arm, Patient Position: Sitting, Cuff Size: Normal)   Pulse 63   Temp 97.9 F (36.6 C) (Oral)   Ht 5\' 11"  (1.803 m)   Wt 195 lb (88.5 kg)   SpO2 98%   BMI 27.20 kg/m    Subjective:    Patient ID: Dwayne Jacobson, male    DOB: 22-Dec-1955, 63 y.o.   MRN: [id number]  CC: Coumadin management   . This visit was completed via telephone due to the restrictions of the COVID-19 pandemic. All issues as above were discussed and addressed but no physical exam was performed. If it was felt that the patient should be evaluated in the office, they were directed there. The patient verbally consented to this visit. Patient was unable to complete an audio/visual visit due to Lack of equipment. Due to the catastrophic nature of the COVID-19 pandemic, this visit was done through audio contact only. . Location of the patient: home . Location of the provider: home . Those involved with this call:  . Provider: Marnee Guarneri, DNP . CMA: Merilyn Baba, CMA . Front Desk/Registration: Don Perking  . Time spent on call: 15 minutes on the phone discussing health concerns   HPI: This patient is a 63 y.o. male who presents for coumadin management. 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 Has SLE which is followed by Dr. Estanislado Pandy.  INR today 1.9.  He reports he has been eating adequate greens and no recent abx use.  Discussed dose change with him, do not want to change large amount as he has been stable on current dose for long period.  Refer to plan for dose changes.  He agrees with current plan and will return in one week for INR check and visit.  Present Coumadin dose: 1 MG 6 days a week and 2 MG one day a week (has been on this dosing for long period) Goal: 2.0-3.0 The patient does not have an active anticoagulation episode. Excessive bruising: no Nose bleeding: no Rectal bleeding: no Prolonged menstrual cycles: N/A  Eating diet with consistent amounts of foods containing Vitamin K:yes Any recent antibiotic use? no  Relevant past medical, surgical, family and social history reviewed and updated as indicated. Interim medical history since our last visit reviewed. Allergies and medications reviewed and updated.  ROS: Per HPI unless specifically indicated above     Objective:    BP 108/67 (BP Location: Right Arm, Patient Position: Sitting, Cuff Size: Normal)   Pulse 63   Temp 97.9 F (36.6 C) (Oral)   Ht 5\' 11"  (1.803 m)   Wt 195 lb (88.5 kg)   SpO2 98%   BMI 27.20 kg/m   Wt Readings from Last 3 Encounters:  05/05/18 195 lb (88.5 kg)  04/04/18 200 lb 3 oz (90.8 kg)  03/10/18 203 lb 3.2 oz (92.2 kg)     General: Well appearing, well nourished in no distress.  Normal mood and affect. Skin: No excessive bruising or rash  Last INR: 2.6    Last CBC:  Lab Results  Component Value Date   WBC 5.4 09/30/2017   HGB 13.7 09/30/2017   HCT 39.7 09/30/2017   MCV 89 09/30/2017   PLT 333 09/30/2017    Results for orders placed or performed in visit on 04/04/18  CoaguChek XS/INR [name]  Result Value Ref Range   INR 2.6 (H) 0.9 - 1.1   Prothrombin Time 30.6 sec  Assessment:     ICD-10-CM   1. 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 Encounter for monitoring Coumadin therapy Z51.81 CoaguChek XS/INR [name]   Z79.01 CoaguChek XS/INR [name]    CBC with Differential/Platelet    Plan:   Discussed current plan face-to-face with patient. For coumadin dosing, elected to change dose to 1 MG five days a week and 2 MG two days a week.  Will plan to recheck INR in 1 week.  I discussed the assessment and treatment plan with the patient. The patient was provided an opportunity to ask questions and all were answered. The patient agreed with the plan and demonstrated an understanding of the instructions.  The patient was advised to call back or  seek an in-person evaluation if the symptoms worsen or if the condition fails to improve as anticipated.  I provided 15 minutes of time during this encounter.

## 2018-05-12 ENCOUNTER — Encounter: Payer: Self-pay | Admitting: Nurse Practitioner

## 2018-05-12 ENCOUNTER — Ambulatory Visit: Payer: 59 | Admitting: Nurse Practitioner

## 2018-05-12 ENCOUNTER — Other Ambulatory Visit: Payer: 59

## 2018-05-12 ENCOUNTER — Other Ambulatory Visit: Payer: Self-pay

## 2018-05-12 VITALS — BP 120/73 | HR 58 | Temp 98.2°F | Wt 199.2 lb

## 2018-05-12 DIAGNOSIS — I82519 Chronic embolism and thrombosis of unspecified femoral vein: Secondary | ICD-10-CM | POA: Diagnosis not present

## 2018-05-12 DIAGNOSIS — Z5181 Encounter for therapeutic drug level monitoring: Secondary | ICD-10-CM | POA: Diagnosis not present

## 2018-05-12 DIAGNOSIS — Z7901 Long term (current) use of anticoagulants: Secondary | ICD-10-CM

## 2018-05-12 LAB — COAGUCHEK XS/INR WAIVED
INR: 2.8 — ABNORMAL HIGH (ref 0.9–1.1)
Prothrombin Time: 34.1 s

## 2018-05-12 NOTE — Assessment & Plan Note (Signed)
Chronic, long term.  INR today 2.8.  Continue current Coumadin regimen.  Current dose 1 MG five days a week and 2 MG two days a week.  Return in 4 weeks.

## 2018-05-12 NOTE — Addendum Note (Signed)
Addended by: Marnee Guarneri T on: 05/12/2018 01:24 PM   Modules accepted: Orders

## 2018-05-12 NOTE — Progress Notes (Signed)
   BP 120/73   Pulse (!) 58   Temp 98.2 F (36.8 C) (Oral)   Wt 199 lb 3.2 oz (90.4 kg)   SpO2 97%   BMI 27.78 kg/m    Subjective:    Patient ID: Dwayne Jacobson, male    DOB: January 27, 1956, 63 y.o.   MRN: [id number]  CC: Coumadin management  HPI: This patient is a 63 y.o. male who presents for coumadin management. 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  He is followed by Dr. Estanislado Pandy for SLE.  On 05/05/2018 his Coumadin dose was changed to 1 MG five days a week and 2 MG two days a week.  At baseline he had been on 1 MG 6 days a week and 2 MG one day a week for a long period of time.  Present Coumadin dose: 1 MG five days a week and 2 MG two days a week Goal: 2.0-3.0 The patient does not have an active anticoagulation episode. Excessive bruising: no Nose bleeding: no Rectal bleeding: no Prolonged menstrual cycles: N/A Eating diet with consistent amounts of foods containing Vitamin K: no Any recent antibiotic use? no  Relevant past medical, surgical, family and social history reviewed and updated as indicated. Interim medical history since our last visit reviewed. Allergies and medications reviewed and updated.  ROS: Per HPI unless specifically indicated above     Objective:    BP 120/73   Pulse (!) 58   Temp 98.2 F (36.8 C) (Oral)   Wt 199 lb 3.2 oz (90.4 kg)   SpO2 97%   BMI 27.78 kg/m   Wt Readings from Last 3 Encounters:  05/12/18 199 lb 3.2 oz (90.4 kg)  05/05/18 195 lb (88.5 kg)  04/04/18 200 lb 3 oz (90.8 kg)     General: Well appearing, well nourished in no distress.  Normal mood and affect. Skin: No excessive bruising or rash  Last INR: 1.9    Last CBC:  Lab Results  Component Value Date   WBC 5.4 09/30/2017   HGB 13.7 09/30/2017   HCT 39.7 09/30/2017   MCV 89 09/30/2017   PLT 333 09/30/2017    Results for orders placed or performed in visit on 05/05/18  CoaguChek XS/INR [name]  Result  Value Ref Range   INR 1.9 (H) 0.9 - 1.1   Prothrombin Time 23.0 sec       Assessment:     ICD-10-CM   1. Encounter for monitoring Coumadin therapy Z51.81    Z79.01   2. 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 For coumadin dosing, elected to continue current dose. Will plan to recheck INR in 1 month.

## 2018-05-12 NOTE — Assessment & Plan Note (Signed)
INR today 2.8.  Continue current Coumadin regimen.  Current dose 1 MG five days a week and 2 MG two days a week.  Return in 4 weeks.

## 2018-05-12 NOTE — Patient Instructions (Signed)
Bleeding Precautions When on Anticoagulant Therapy, Adult Anticoagulant therapy, also called blood thinner therapy, is medicine that helps to prevent and treat blood clots. The medicine works by stopping blood clots from forming or growing. Blood clots that form in your blood vessels can be dangerous. They can break loose and travel to the heart, lungs, or brain. This increases the risk of a heart attack, stroke, or blocked lung artery (pulmonary embolism). Anticoagulants also increase the risk of bleeding. Try to protect yourself from cuts and other injuries that can cause bleeding. It is important to take anticoagulants exactly as told by your health care provider. Why do I need to be on anticoagulant therapy? You may need this medicine if you are at risk of developing a blood clot. Conditions that increase your risk of a blood clot include:  Being born with heart disease or a heart malformation (congenital heart disease).  Developing heart disease.  Having had surgery, such as valve replacement.  Having had a serious accident or other type of severe injury (trauma).  Having certain types of cancer.  Having certain diseases that can increase blood clotting.  Having a high risk of stroke or heart attack.  Having atrial fibrillation (AF). What are the common anticoagulant medicines? There are several types of anticoagulant medicines. The most common types are:  Medicines that you take by mouth (oral medicines), such as: ? Warfarin. ? Novel oral anticoagulants (NOACs), such as: ? Direct thrombin inhibitors (dabigatran). ? Factor Xa inhibitors (apixaban, edoxaban, and rivaroxaban).  Injections, such as: ? Unfractionated heparin. ? Low molecular weight heparin. These anticoagulants work in different ways to prevent blood clots. They also have different risks and side effects. What do I need to remember while on anticoagulant therapy? Taking anticoagulants  Take your medicine at the  same time every day. If you forget to take your medicine, take it as soon as you remember. Do not double your dosage of medicine if you miss a whole day. Take your normal dose and call your health care provider.  Do not stop taking your medicine unless your health care provider approves. Stopping the medicine can increase your risk of developing a blood clot. Taking other medicines  Take over-the-counter and prescriptions medicines only as told by your health care provider.  Do not take over-the-counter NSAIDs, including aspirin and ibuprofen, while you are on anticoagulant therapy. These medicines increase your risk of dangerous bleeding.  Get approval from your health care provider before you start taking any new medicines, vitamins, or herbal products. Some of these could interfere with your therapy. General instructions  Keep all follow-up visits as told by your health care provider. This is important.  If you are pregnant or trying to get pregnant, talk with a health care provider about anticoagulants. Some of these medicines are not safe to take during pregnancy.  Tell all health care providers, including your dentist, that you are on anticoagulant therapy. It is especially important to tell providers before you have any surgery, medical procedures, or dental work done. What precautions should I take?   Be very careful when using knives, scissors, or other sharp objects.  Use an electric razor instead of a blade.  Do not use toothpicks.  Use a soft-bristled toothbrush. Brush your teeth gently.  Always wear shoes outdoors and wear slippers indoors.  Be careful when cutting your fingernails and toenails.  Place bath mats in the bathroom. If possible, install handrails as well.  Wear gloves while you do  yard work.  Wear your seat belt.  Prevent falls by removing loose rugs and extension cords from areas where you walk. Use a cane or walker if you need it.  Avoid  constipation by: ? Drinking enough fluid to keep your urine clear or pale yellow. ? Eating foods that are high in fiber, such as fresh fruits and vegetables, whole grains, and beans. ? Limiting foods that are high in fat and processed sugars, such as fried and sweet foods.  Do not play contact sports or participate in other activities that have a high risk for injury. What other precautions are important if on warfarin therapy? If you are taking a type of anticoagulant called warfarin, make sure you:  Work with a diet and nutrition specialist (dietitian) to make an eating plan. Do not make any sudden changes to your diet after you have started your eating plan.  Do not drink alcohol. It can interfere with your medicine and increase your risk of an injury that causes bleeding.  Get regular blood tests as told by your health care provider. What are some questions to ask my health care provider?  Why do I need anticoagulant therapy?  What is the best anticoagulant therapy for my condition?  How long will I need anticoagulant therapy?  What are the side effects of anticoagulant therapy?  When should I take my medicine? What should I do if I forget to take it?  Will I need to have regular blood tests?  Do I need to change my diet? Are there foods or drinks that I should avoid?  What activities are safe for me?  What should I do if I want to get pregnant? Contact a health care provider if:  You miss a dose of medicine: ? And you are not sure what to do. ? For more than one day.  You have: ? Menstrual bleeding that is heavier than normal. ? Bloody or brown urine. ? Easy bruising. ? Black and tarry stool or bright red stool. ? Side effects from your medicine.  You feel weak or dizzy.  You become pregnant. Get help right away if:  You have bleeding that will not stop within 20 minutes from: ? The nose. ? The gums. ? A cut on the skin.  You have a severe headache or  stomachache.  You vomit or cough up blood.  You fall or hit your head. Summary  Anticoagulant therapy, also called blood thinner therapy, is medicine that helps to prevent and treat blood clots.  Anticoagulants work in different ways to prevent blood clots. They also have different risks and side effects.  Talk with your health care provider about any precautions that you should take while on anticoagulant therapy. This information is not intended to replace advice given to you by your health care provider. Make sure you discuss any questions you have with your health care provider. Document Released: 12/30/2014 Document Revised: 04/06/2016 Document Reviewed: 04/06/2016 Elsevier Interactive Patient Education  2019 [employer].

## 2018-05-19 ENCOUNTER — Ambulatory Visit: Payer: 59 | Admitting: Physician Assistant

## 2018-05-26 ENCOUNTER — Ambulatory Visit: Payer: 59 | Admitting: Physician Assistant

## 2018-06-06 ENCOUNTER — Other Ambulatory Visit: Payer: Self-pay | Admitting: Nurse Practitioner

## 2018-06-06 DIAGNOSIS — Z7901 Long term (current) use of anticoagulants: Principal | ICD-10-CM

## 2018-06-06 DIAGNOSIS — Z5181 Encounter for therapeutic drug level monitoring: Secondary | ICD-10-CM

## 2018-06-06 NOTE — Progress Notes (Signed)
Future lab order  

## 2018-06-09 ENCOUNTER — Ambulatory Visit (INDEPENDENT_AMBULATORY_CARE_PROVIDER_SITE_OTHER): Payer: 59 | Admitting: Nurse Practitioner

## 2018-06-09 ENCOUNTER — Encounter: Payer: Self-pay | Admitting: Nurse Practitioner

## 2018-06-09 ENCOUNTER — Other Ambulatory Visit: Payer: Self-pay

## 2018-06-09 VITALS — BP 137/77 | HR 56 | Temp 97.5°F | Ht 71.0 in | Wt 202.0 lb

## 2018-06-09 DIAGNOSIS — I82519 Chronic embolism and thrombosis of unspecified femoral vein: Secondary | ICD-10-CM | POA: Diagnosis not present

## 2018-06-09 LAB — COAGUCHEK XS/INR WAIVED
INR: 2.9 — ABNORMAL HIGH (ref 0.9–1.1)
Prothrombin Time: 34.2 s

## 2018-06-09 NOTE — Patient Instructions (Signed)
Bleeding Precautions When on Anticoagulant Therapy, Adult Anticoagulant therapy, also called blood thinner therapy, is medicine that helps to prevent and treat blood clots. The medicine works by stopping blood clots from forming or growing. Blood clots that form in your blood vessels can be dangerous. They can break loose and travel to the heart, lungs, or brain. This increases the risk of a heart attack, stroke, or blocked lung artery (pulmonary embolism). Anticoagulants also increase the risk of bleeding. Try to protect yourself from cuts and other injuries that can cause bleeding. It is important to take anticoagulants exactly as told by your health care provider. Why do I need to be on anticoagulant therapy? You may need this medicine if you are at risk of developing a blood clot. Conditions that increase your risk of a blood clot include:  Being born with heart disease or a heart malformation (congenital heart disease).  Developing heart disease.  Having had surgery, such as valve replacement.  Having had a serious accident or other type of severe injury (trauma).  Having certain types of cancer.  Having certain diseases that can increase blood clotting.  Having a high risk of stroke or heart attack.  Having atrial fibrillation (AF). What are the common anticoagulant medicines? There are several types of anticoagulant medicines. The most common types are:  Medicines that you take by mouth (oral medicines), such as: ? Warfarin. ? Novel oral anticoagulants (NOACs), such as: ? Direct thrombin inhibitors (dabigatran). ? Factor Xa inhibitors (apixaban, edoxaban, and rivaroxaban).  Injections, such as: ? Unfractionated heparin. ? Low molecular weight heparin. These anticoagulants work in different ways to prevent blood clots. They also have different risks and side effects. What do I need to remember while on anticoagulant therapy? Taking anticoagulants  Take your medicine at the  same time every day. If you forget to take your medicine, take it as soon as you remember. Do not double your dosage of medicine if you miss a whole day. Take your normal dose and call your health care provider.  Do not stop taking your medicine unless your health care provider approves. Stopping the medicine can increase your risk of developing a blood clot. Taking other medicines  Take over-the-counter and prescriptions medicines only as told by your health care provider.  Do not take over-the-counter NSAIDs, including aspirin and ibuprofen, while you are on anticoagulant therapy. These medicines increase your risk of dangerous bleeding.  Get approval from your health care provider before you start taking any new medicines, vitamins, or herbal products. Some of these could interfere with your therapy. General instructions  Keep all follow-up visits as told by your health care provider. This is important.  If you are pregnant or trying to get pregnant, talk with a health care provider about anticoagulants. Some of these medicines are not safe to take during pregnancy.  Tell all health care providers, including your dentist, that you are on anticoagulant therapy. It is especially important to tell providers before you have any surgery, medical procedures, or dental work done. What precautions should I take?   Be very careful when using knives, scissors, or other sharp objects.  Use an electric razor instead of a blade.  Do not use toothpicks.  Use a soft-bristled toothbrush. Brush your teeth gently.  Always wear shoes outdoors and wear slippers indoors.  Be careful when cutting your fingernails and toenails.  Place bath mats in the bathroom. If possible, install handrails as well.  Wear gloves while you do  yard work.  Wear your seat belt.  Prevent falls by removing loose rugs and extension cords from areas where you walk. Use a cane or walker if you need it.  Avoid  constipation by: ? Drinking enough fluid to keep your urine clear or pale yellow. ? Eating foods that are high in fiber, such as fresh fruits and vegetables, whole grains, and beans. ? Limiting foods that are high in fat and processed sugars, such as fried and sweet foods.  Do not play contact sports or participate in other activities that have a high risk for injury. What other precautions are important if on warfarin therapy? If you are taking a type of anticoagulant called warfarin, make sure you:  Work with a diet and nutrition specialist (dietitian) to make an eating plan. Do not make any sudden changes to your diet after you have started your eating plan.  Do not drink alcohol. It can interfere with your medicine and increase your risk of an injury that causes bleeding.  Get regular blood tests as told by your health care provider. What are some questions to ask my health care provider?  Why do I need anticoagulant therapy?  What is the best anticoagulant therapy for my condition?  How long will I need anticoagulant therapy?  What are the side effects of anticoagulant therapy?  When should I take my medicine? What should I do if I forget to take it?  Will I need to have regular blood tests?  Do I need to change my diet? Are there foods or drinks that I should avoid?  What activities are safe for me?  What should I do if I want to get pregnant? Contact a health care provider if:  You miss a dose of medicine: ? And you are not sure what to do. ? For more than one day.  You have: ? Menstrual bleeding that is heavier than normal. ? Bloody or brown urine. ? Easy bruising. ? Black and tarry stool or bright red stool. ? Side effects from your medicine.  You feel weak or dizzy.  You become pregnant. Get help right away if:  You have bleeding that will not stop within 20 minutes from: ? The nose. ? The gums. ? A cut on the skin.  You have a severe headache or  stomachache.  You vomit or cough up blood.  You fall or hit your head. Summary  Anticoagulant therapy, also called blood thinner therapy, is medicine that helps to prevent and treat blood clots.  Anticoagulants work in different ways to prevent blood clots. They also have different risks and side effects.  Talk with your health care provider about any precautions that you should take while on anticoagulant therapy. This information is not intended to replace advice given to you by your health care provider. Make sure you discuss any questions you have with your health care provider. Document Released: 12/30/2014 Document Revised: 04/06/2016 Document Reviewed: 04/06/2016 Elsevier Interactive Patient Education  2019 [employer].

## 2018-06-09 NOTE — Assessment & Plan Note (Signed)
Chronic, long term Coumadin use.  Reduce dose to previous long term dose now that INR improved:  1 MG 6 days a week and 2 MG 1 day a week.  Return in one week for CBC and INR.  Call provider immediately if any episodes of bleeding or go to nearest ER.

## 2018-06-09 NOTE — Progress Notes (Signed)
BP 137/77   Pulse (!) 56   Temp (!) 97.5 F (36.4 C) (Oral)   Ht 5\' 11"  (1.803 m)   Wt 202 lb (91.6 kg)   SpO2 98%   BMI 28.17 kg/m    Subjective:    Patient ID: Dwayne Jacobson, male    DOB: [birth year], 63 y.o.   MRN: [id number]   . This visit was completed via telephone due to the restrictions of the COVID-19 pandemic. All issues as above were discussed and addressed but no physical exam was performed. If it was felt that the patient should be evaluated in the office, they were directed there. The patient verbally consented to this visit. Patient was unable to complete an audio/visual visit due to Lack of equipment. Due to the catastrophic nature of the COVID-19 pandemic, this visit was done through audio contact only. . Location of the patient: home . Location of the provider: home . Those involved with this call:  . Provider: Marnee Guarneri, DNP . CMA: Yvonna Alanis, CMA . Front Desk/Registration: Jill Side  . Time spent on call: 15 minutes on the phone discussing health concerns. 5 minutes total spent in review of patient's record and preparation of their chart. I verified patient identity using two factors (patient name and date of birth). Patient consents verbally to being seen via telemedicine visit today.   CC: Coumadin management  HPI: This patient is a 63 y.o. male who presents for coumadin management. 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  He is followed by Dr. Estanislado Pandy for SLE.  Previously had been on long term dosing of 1 MG 6 days a week and 2 MG 1 day a week, but on 05/05/2018 had to increase to current dose due to INR 1.9.    Present Coumadin dose: 1 MG 5 days a week and 2 MG 2 days a week  Goal: 2.0-3.0 The patient does not have an active anticoagulation episode. Excessive bruising: no Nose bleeding: no Rectal bleeding:reports one episode a week ago of some blood, pinkish, when wiping, but none  since Prolonged menstrual cycles: N/A Eating diet with consistent amounts of foods containing Vitamin K:yes Any recent antibiotic use? no  Relevant past medical, surgical, family and social history reviewed and updated as indicated. Interim medical history since our last visit reviewed. Allergies and medications reviewed and updated.  ROS: Per HPI unless specifically indicated above     Objective:    BP 137/77   Pulse (!) 56   Temp (!) 97.5 F (36.4 C) (Oral)   Ht 5\' 11"  (1.803 m)   Wt 202 lb (91.6 kg)   SpO2 98%   BMI 28.17 kg/m   Wt Readings from Last 3 Encounters:  06/09/18 202 lb (91.6 kg)  05/12/18 199 lb 3.2 oz (90.4 kg)  05/05/18 195 lb (88.5 kg)     General: Unable to assess, telephone only.  Last INR: 2.8    Last CBC:  Lab Results  Component Value Date   WBC 5.4 09/30/2017   HGB 13.7 09/30/2017   HCT 39.7 09/30/2017   MCV 89 09/30/2017   PLT 333 09/30/2017    Results for orders placed or performed in visit on 05/12/18  CoaguChek XS/INR [name]  Result Value Ref Range   INR 2.8 (H) 0.9 - 1.1   Prothrombin Time 34.1 sec       Assessment:     ICD-10-CM   1. 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 For coumadin dosing, elected to change dose to his previous long term dose of  1 MG 6 days a week and 2 MG 1 day a week. Will plan to recheck INR in 1 week.  He is to notify provider immediately if any further rectal bleeding episodes with wiping. Will obtain CBC with INR in one week.  I discussed the assessment and treatment plan with the patient. The patient was provided an opportunity to ask questions and all were answered. The patient agreed with the plan and demonstrated an understanding of the instructions.   The patient was advised to call back or seek an  in-person evaluation if the symptoms worsen or if the condition fails to improve as anticipated.   I provided 15 minutes of time during this encounter.

## 2018-06-16 ENCOUNTER — Encounter: Payer: Self-pay | Admitting: Nurse Practitioner

## 2018-06-16 ENCOUNTER — Other Ambulatory Visit: Payer: Self-pay

## 2018-06-16 ENCOUNTER — Ambulatory Visit: Payer: 59 | Admitting: Nurse Practitioner

## 2018-06-16 VITALS — BP 123/78 | HR 56 | Temp 98.1°F | Ht 71.0 in | Wt 201.0 lb

## 2018-06-16 DIAGNOSIS — Z7901 Long term (current) use of anticoagulants: Secondary | ICD-10-CM | POA: Diagnosis not present

## 2018-06-16 DIAGNOSIS — Z5181 Encounter for therapeutic drug level monitoring: Secondary | ICD-10-CM | POA: Diagnosis not present

## 2018-06-16 LAB — COAGUCHEK XS/INR WAIVED
INR: 3.6 — ABNORMAL HIGH (ref 0.9–1.1)
Prothrombin Time: 42.8 s

## 2018-06-16 NOTE — Patient Instructions (Signed)
Vitamin K Foods and Warfarin  Warfarin is a blood thinner (anticoagulant). Anticoagulant medicines help prevent the formation of blood clots. These medicines work by decreasing the activity of vitamin K, which promotes normal blood clotting.  When you take warfarin, problems can occur from suddenly increasing or decreasing the amount of vitamin K that you eat from one day to the next. Problems may include:  · Blood clots.  · Bleeding.  What general guidelines do I need to follow?  To avoid problems when taking warfarin:  · Eat a balanced diet that includes:  ? Fresh fruits and vegetables.  ? Whole grains.  ? Low-fat dairy products.  ? Lean proteins, such as fish, eggs, and lean cuts of meat.  · Keep your intake of vitamin K consistent from day to day. To do this:  ? Avoid eating large amounts of vitamin K one day and low amounts of vitamin K the next day.  ? If you take a multivitamin that contains vitamin K, be sure to take it every day.  ? Know which foods contain vitamin K. Use the lists below to understand serving sizes and the amount of vitamin K in one serving.  · Avoid major changes in your diet. If you are going to change your diet, talk with your health care provider before making changes.  · Work with a nutrition specialist (dietitian) to develop a meal plan that works best for you.    High vitamin K foods  Foods that are high in vitamin K contain more than 100 mcg (micrograms) per serving. These include:  · Broccoli (cooked) - ½ cup has 110 mcg.  · Brussels sprouts (cooked) - ½ cup has 109 mcg.  · Greens, beet (cooked) - ½ cup has 350 mcg.  · Greens, collard (cooked) - ½ cup has 418 mcg.  · Greens, turnip (cooked) - ½ cup has 265 mcg.  · Green onions or scallions - ½ cup has 105 mcg.  · Kale (fresh or frozen) - ½ cup has 531 mcg.  · Parsley (raw) - 10 sprigs has 164 mcg.  · Spinach (cooked) - ½ cup has 444 mcg.  · Swiss chard (cooked) - ½ cup has 287 mcg.  Moderate vitamin K foods  Foods that have a  moderate amount of vitamin K contain 25-100 mcg per serving. These include:  · Asparagus (cooked) - 5 spears have 38 mcg.  · Black-eyed peas (dried) - ½ cup has 32 mcg.  · Cabbage (cooked) - ½ cup has 37 mcg.  · Kiwi fruit - 1 medium has 31 mcg.  · Lettuce - 1 cup has 57-63 mcg.  · Okra (frozen) - ½ cup has 44 mcg.  · Prunes (dried) - 5 prunes have 25 mcg.  · Watercress (raw) - 1 cup has 85 mcg.  Low vitamin K foods  Foods low in vitamin K contain less than 25 mcg per serving. These include:  · Artichoke - 1 medium has 18 mcg.  · Avocado - 1 oz. has 6 mcg.  · Blueberries - ½ cup has 14 mcg.  · Cabbage (raw) - ½ cup has 21 mcg.  · Carrots (cooked) - ½ cup has 11 mcg.  · Cauliflower (raw) - ½ cup has 11 mcg.  · Cucumber with peel (raw) - ½ cup has 9 mcg.  · Grapes - ½ cup has 12 mcg.  · Mango - 1 medium has 9 mcg.  · Nuts - 1 oz. has 15 mcg.  ·   Pear - 1 medium has 8 mcg.  · Peas (cooked) - ½ cup has 19 mcg.  · Pickles - 1 spear has 14 mcg.  · Pumpkin seeds - 1 oz. has 13 mcg.  · Sauerkraut (canned) - ½ cup has 16 mcg.  · Soybeans (cooked) - ½ cup has 16 mcg.  · Tomato (raw) - 1 medium has 10 mcg.  · Tomato sauce - ½ cup has 17 mcg.  Vitamin K-free foods  If a food contain less than 5 mcg per serving, it is considered to have no vitamin K. These foods include:  · Bread and cereal products.  · Cheese.  · Eggs.  · Fish and shellfish.  · Meat and poultry.  · Milk and dairy products.  · Sunflower seeds.  Actual amounts of vitamin K in foods may be different depending on processing. Talk with your dietitian about what foods you can eat and what foods you should avoid.  This information is not intended to replace advice given to you by your health care provider. Make sure you discuss any questions you have with your health care provider.  Document Released: 11/15/2008 Document Revised: 08/10/2015 Document Reviewed: 04/23/2015  Elsevier Interactive Patient Education © 2019 [employer]

## 2018-06-16 NOTE — Progress Notes (Signed)
   BP 123/78   Pulse (!) 56   Temp 98.1 F (36.7 C) (Oral)   Ht 5\' 11"  (1.803 m)   Wt 201 lb (91.2 kg)   SpO2 99%   BMI 28.03 kg/m    Subjective:    Patient ID: Dwayne Jacobson, male    DOB: 1955-03-01, 63 y.o.   MRN: [id number]  CC: Coumadin management  HPI: This patient is a 63 y.o. male who presents for coumadin management. 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  Present Coumadin dose: 1 MG 6 days a week and 2 MG 1 day a week Goal: 2.0-3.0 The patient does not have an active anticoagulation episode. Excessive bruising: no Nose bleeding: no Rectal bleeding: no Prolonged menstrual cycles: N/A Eating diet with consistent amounts of foods containing Vitamin K: no, reports he has not been as consistent with eating spinach which keepsthis INR stable.  States it is difficult to find during the pandemic. Any recent antibiotic use? no  Relevant past medical, surgical, family and social history reviewed and updated as indicated. Interim medical history since our last visit reviewed. Allergies and medications reviewed and updated.  ROS: Per HPI unless specifically indicated above     Objective:    BP 123/78   Pulse (!) 56   Temp 98.1 F (36.7 C) (Oral)   Ht 5\' 11"  (1.803 m)   Wt 201 lb (91.2 kg)   SpO2 99%   BMI 28.03 kg/m   Wt Readings from Last 3 Encounters:  06/16/18 201 lb (91.2 kg)  06/09/18 202 lb (91.6 kg)  05/12/18 199 lb 3.2 oz (90.4 kg)     General: Well appearing, well nourished in no distress.  Normal mood and affect. Skin: No excessive bruising or rash  Last INR: 2.9    Last CBC:  Lab Results  Component Value Date   WBC 5.4 09/30/2017   HGB 13.7 09/30/2017   HCT 39.7 09/30/2017   MCV 89 09/30/2017   PLT 333 09/30/2017    Results for orders placed or performed in visit on 06/09/18  CoaguChek XS/INR [name]  Result Value Ref Range   INR 2.9 (H) 0.9 - 1.1   Prothrombin Time 34.2 sec        Assessment:     ICD-10-CM   1. Encounter for monitoring Coumadin therapy Z51.81 CoaguChek XS/INR [name]   Z79.01     Plan:   Discussed current plan face-to-face with patient. For coumadin dosing, elected to change dose to hold dose tomorrow and then start 1 MG daily. Will plan to recheck INR in 1 week.2 weeks per patient request although he will notify provider sooner if any issues.

## 2018-06-16 NOTE — Assessment & Plan Note (Signed)
INR today 3.6.  Has not been consistent with greens intake.  Hold dose Coumadin tomorrow and then start 1 MG a day.  He is unable to come in for INR next week, will repeat in two weeks with CBC.

## 2018-06-30 ENCOUNTER — Encounter: Payer: Self-pay | Admitting: Nurse Practitioner

## 2018-06-30 ENCOUNTER — Ambulatory Visit: Payer: 59 | Admitting: Nurse Practitioner

## 2018-06-30 ENCOUNTER — Other Ambulatory Visit: Payer: Self-pay

## 2018-06-30 DIAGNOSIS — Z5181 Encounter for therapeutic drug level monitoring: Secondary | ICD-10-CM

## 2018-06-30 DIAGNOSIS — Z7901 Long term (current) use of anticoagulants: Secondary | ICD-10-CM | POA: Diagnosis not present

## 2018-06-30 DIAGNOSIS — I82519 Chronic embolism and thrombosis of unspecified femoral vein: Secondary | ICD-10-CM

## 2018-06-30 LAB — COAGUCHEK XS/INR WAIVED
INR: 2.2 — ABNORMAL HIGH (ref 0.9–1.1)
Prothrombin Time: 26.5 s

## 2018-06-30 NOTE — Assessment & Plan Note (Signed)
Chronic, long term Coumadin use d/t h/o DVT.  INR today 2.2, patient would like to return to his previous long term dose of 1 MG 6 days a week and 2 MG one day a week.  We trial this and he will return in 2 weeks for INR recheck.  Will obtain CBC at this next visit.  He is aware to call provider immediately if any episodes of bleeding or go to nearest ER.

## 2018-06-30 NOTE — Assessment & Plan Note (Signed)
Chronic, long term Coumadin use and followed by Dr. Estanislado Pandy for SLE.  INR today 2.2, patient would like to return to his previous long term dose of 1 MG 6 days a week and 2 MG one day a week.  We trial this and he will return in 2 weeks for INR recheck.  Will obtain CBC at this next visit.  He is aware to call provider immediately if any episodes of bleeding or go to nearest ER.

## 2018-06-30 NOTE — Progress Notes (Signed)
Office Visit Note  Patient: Dwayne Jacobson             Date of Birth: Dec 01, 1955           MRN: [id number]             PCP: Venita Lick, NP Referring: Venita Lick, NP Visit Date: 07/14/2018 Occupation: @GUAROCC @  Subjective:  Medication monitoring    History of Present Illness: Dwayne Jacobson is a 63 y.o. male with history of systemic lupus erythematosus. He denies any signs or symptoms of a recent lupus flare.  He continues to take Plaquenil 200 mg 1 tablet po daily. He denies any recent rashes.  He continues to have photosensitivity. He occasionally has sores in his mouth but denies nasal ulcerations.  He denies any sicca symptoms.  He denies swollen lymph nodes.  He denies any palpitations or shortness of breath.  He denies any joint pain or joint swelling.    Activities of Daily Living:  Patient reports morning stiffness for 0 minute.   Patient Denies nocturnal pain.  Difficulty dressing/grooming: Denies Difficulty climbing stairs: Denies Difficulty getting out of chair: Denies Difficulty using hands for taps, buttons, cutlery, and/or writing: Denies  Review of Systems  Constitutional: Negative for fatigue and night sweats.  HENT: Positive for nose dryness (Nasal ulcerations ). Negative for mouth sores and mouth dryness.   Eyes: Negative for redness and dryness.  Respiratory: Negative for cough, hemoptysis, shortness of breath, wheezing and difficulty breathing.   Cardiovascular: Negative for chest pain, palpitations, hypertension, irregular heartbeat and swelling in legs/feet.  Gastrointestinal: Negative for blood in stool, constipation and diarrhea.  Endocrine: Negative for excessive thirst and increased urination.  Genitourinary: Negative for painful urination.  Musculoskeletal: Negative for arthralgias, joint pain, joint swelling, myalgias, muscle weakness, morning stiffness, muscle tenderness and myalgias.  Skin: Positive for sensitivity to sunlight. Negative  for color change, rash, hair loss, nodules/bumps, skin tightness and ulcers.  Allergic/Immunologic: Negative for susceptible to infections.  Neurological: Negative for dizziness, fainting, memory loss, night sweats and weakness.  Hematological: Negative for swollen glands.  Psychiatric/Behavioral: Negative for depressed mood and sleep disturbance. The patient is not nervous/anxious.     PMFS History:  Patient Active Problem List   Diagnosis Date Noted  . Encounter for monitoring Coumadin therapy 04/01/2017  . Chondrocalcinosis 01/13/2017  . Primary osteoarthritis of both knees 01/13/2017  . DVT (deep venous thrombosis) ([place])   . Hypertension   . Hyperlipidemia   . SLE (systemic lupus erythematosus) ([place]) 01/18/2014    Past Medical History:  Diagnosis Date  . DVT (deep venous thrombosis) ([place])   . Hyperlipidemia   . Hypertension   . Seizures ([place])   . Skin cancer   . Systemic lupus erythematosus ([facility])     Family History  Problem Relation Age of Onset  . Diabetes Mother   . Hypertension Mother   . Hyperlipidemia Mother   . Stroke Mother   . Lung disease Mother   . Heart disease Mother   . Heart disease Father   . Lung disease Father   . Diabetes Sister    History reviewed. No pertinent surgical history. Social History   Social History Narrative  . Not on file   Immunization History  Administered Date(s) Administered  . Influenza,inj,Quad PF,6+ Mos 01/03/2015, 11/05/2016  . Pneumococcal Polysaccharide-23 09/29/2012  . Td 03/25/2006, 08/06/2016     Objective: Vital Signs: Ht 5\' 11"  (1.803 m)   Wt 199 lb (  90.3 kg)   BMI 27.75 kg/m    Physical Exam Vitals signs and nursing note reviewed.  Constitutional:      Appearance: He is well-developed.  HENT:     Head: Normocephalic and atraumatic.  Eyes:     Conjunctiva/sclera: Conjunctivae normal.     Pupils: Pupils are equal, round, and reactive to light.  Neck:     Musculoskeletal: Normal range of motion and  neck supple.  Cardiovascular:     Rate and Rhythm: Normal rate and regular rhythm.     Heart sounds: Normal heart sounds.  Pulmonary:     Effort: Pulmonary effort is normal.     Breath sounds: Normal breath sounds.  Abdominal:     General: Bowel sounds are normal.     Palpations: Abdomen is soft.  Lymphadenopathy:     Cervical: No cervical adenopathy.  Skin:    General: Skin is warm and dry.     Capillary Refill: Capillary refill takes less than 2 seconds.  Neurological:     Mental Status: He is alert and oriented to person, place, and time.  Psychiatric:        Behavior: Behavior normal.      Musculoskeletal Exam: C-spine, thoracic spine, and lumbar spine good ROM.  No midline spinal tenderness.  No SI joint tenderness.  Shoulder joints, elbow joints, wrist joints, MCPs, PIPs, and DIPs good ROM with no synovitis.  Hip joints, knee joints, ankle joints, MTPs, PIPs, and DIPs good ROM with no synovitis. No warmth or effusion of knee joints.  No tenderness or swelling of ankle joints.    CDAI Exam: CDAI Score: - Patient Global: -; Provider Global: - Swollen: -; Tender: - Joint Exam   No joint exam has been documented for this visit   There is currently no information documented on the homunculus. Go to the Rheumatology activity and complete the homunculus joint exam.  Investigation: No additional findings.  Imaging: No results found.  Recent Labs: Lab Results  Component Value Date   WBC 5.4 09/30/2017   HGB 13.7 09/30/2017   PLT 333 09/30/2017   NA 132 (L) 09/30/2017   K 4.0 09/30/2017   CL 93 (L) 09/30/2017   CO2 23 09/30/2017   GLUCOSE 84 09/30/2017   BUN 12 09/30/2017   CREATININE 0.78 09/30/2017   BILITOT 0.2 09/30/2017   ALKPHOS 65 09/30/2017   AST 27 09/30/2017   ALT 28 09/30/2017   PROT 7.7 09/30/2017   ALBUMIN 4.8 09/30/2017   CALCIUM 9.6 09/30/2017   GFRAA 112 09/30/2017    Speciality Comments: PLQ Eye Exam: 10/29/16 WNL @ [facility]   Procedures:  No procedures performed Allergies: Patient has no known allergies.   Assessment / Plan:     Visit Diagnoses: Other systemic lupus erythematosus with other organ involvement ([place]) - Positive dsDNA, positive Ro. History of rash, diagnosed with lupus in the past: He has not had any recent signs or symptoms of a lupus flare.  He is clinically doing well on Plaquenil 200 mg 1 tablet by mouth daily.  He has not had any recent rashes.  No malar rash was noted on exam.  He continues to follow-up with his dermatologist on a regular basis.  He continues to have photosensitivity.  He is encouraged to wear sunscreen on a daily basis.  He has not had any symptoms of Raynaud's recently.  No digital stations or signs of gangrene were noted.  He has intermittent nasal ulcerations but no  oral ulcerations.  He has not had any sicca symptoms.  He denies any palpitations or shortness of breath.  No cervical lymphadenopathy.  His level of fatigue has been stable.  He will continue taking Plaquenil 200 mg 1 tablet by mouth daily.  We will check autoimmune lab work today.  He is advised to notify us if he develops any new or worsening symptoms.  He will follow-up in the office in 5 months.- Plan: Anti-DNA antibody, double-stranded, C3 and C4, Sedimentation rate, Urinalysis, Routine w reflex microscopic, VITAMIN D 25 Hydroxy (Vit-D Deficiency, Fractures), Sjogrens syndrome-A extractable nuclear antibody  High risk medication use - He is taking Plaquenil 200 mg daily.  Last Plaquenil eye exam normal on 10/29/2016.  Most recent CBC/CMP within normal limits on 09/30/2017.  Due for CBC/CMP today and will monitor every 5 months.  - Plan: CBC with Differential/Platelet, COMPLETE METABOLIC PANEL WITH GFR  Primary osteoarthritis of both knees -He has good ROM with no discomfort.  No warmth or effusion of knee joints.    Chondrocalcinosis   History of DVT (deep vein thrombosis) - He takes warfain.   Essential  hypertension   Mixed hyperlipidemia   Orders: Orders Placed This Encounter  Procedures  . CBC with Differential/Platelet  . COMPLETE METABOLIC PANEL WITH GFR  . Anti-DNA antibody, double-stranded  . C3 and C4  . Sedimentation rate  . Urinalysis, Routine w reflex microscopic  . VITAMIN D 25 Hydroxy (Vit-D Deficiency, Fractures)  . Sjogrens syndrome-A extractable nuclear antibody   No orders of the defined types were placed in this encounter.     Follow-Up Instructions: Return in about 5 months (around 12/14/2018) for Systemic lupus erythematosus.   Ofilia Neas, PA-C   I examined and evaluated the patient with Hazel Sams PA.  Patient is currently asymptomatic.  He had no synovitis on examination.  He denies any recent flare of pseudogout.  He states he had episode of rash during the last summer.  He is on low-dose Plaquenil.  We will obtain labs today.  The plan of care was discussed as noted above.  Bo Merino, MD  Note - This record has been created using Editor, commissioning.  Chart creation errors have been sought, but may not always  have been located. Such creation errors do not reflect on  the standard of medical care.

## 2018-06-30 NOTE — Progress Notes (Signed)
There were no vitals taken for this visit.   Subjective:    Patient ID: Dwayne Jacobson, male    DOB: 11/09/1955, 63 y.o.   MRN: [id number]    This visit was completed via telephone due to the restrictions of the COVID-19 pandemic. All issues as above were discussed and addressed but no physical exam was performed. If it was felt that the patient should be evaluated in the office, they were directed there. The patient verbally consented to this visit. Patient was unable to complete an audio/visual visit due to Lack of equipment. Due to the catastrophic nature of the COVID-19 pandemic, this visit was done through audio contact only.  Location of the patient: home  Location of the provider: home  Those involved with this call:   Provider: Marnee Guarneri, DNP  CMA: Tiffany Reel, CMA  Front Desk/Registration: Jill Side   Time spent on call: 15 minutes on the phone discussing health concerns. 5 minutes total spent in review of patient's record and preparation of their chart. I verified patient identity using two factors (patient name and date of birth). Patient consents verbally to being seen via telemedicine visit today.   CC: Coumadin management  HPI: This patient is a 63 y.o. male who presents for coumadin management. 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  Last INR 3.6, he had not been eating as many greens due to Covid 19 pandemic and lack of supply.  At baseline he had been well controlled for long period on a maintenance dose.  Current dose Coumadin 1 MG per day.  INR today 2.2, he would like to try going back to his long term dose of 1 MG 6 days a week and 2 MG one day a week.  He will return in 2 weeks for recheck.  Is now consistently eating his greens, which previously helped control his INR.  Present Coumadin dose: Goal: 2.0-3.0 The patient does not have an active anticoagulation  episode. Excessive bruising: no Nose bleeding: no Rectal bleeding: no Prolonged menstrual cycles: N/A Eating diet with consistent amounts of foods containing Vitamin K:yes Any recent antibiotic use? no  Relevant past medical, surgical, family and social history reviewed and updated as indicated. Interim medical history since our last visit reviewed. Allergies and medications reviewed and updated.  ROS: Per HPI unless specifically indicated above     Objective:    There were no vitals taken for this visit.  Wt Readings from Last 3 Encounters:  06/16/18 201 lb (91.2 kg)  06/09/18 202 lb (91.6 kg)  05/12/18 199 lb 3.2 oz (90.4 kg)     General: Unable to perform, telephone visit only.  Last INR: 3.6    Last CBC:  Lab Results  Component Value Date   WBC 5.4 09/30/2017   HGB 13.7 09/30/2017   HCT 39.7 09/30/2017   MCV 89 09/30/2017   PLT 333 09/30/2017    Results for orders placed or performed in visit on 06/16/18  CoaguChek XS/INR [name]  Result Value Ref Range   INR 3.6 (H) 0.9 - 1.1   Prothrombin Time 42.8 sec       Assessment:     ICD-10-CM   1. 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, unspecified laterality (HCC) I82.519 CoaguChek XS/INR [name]  2. Encounter for monitoring Coumadin therapy Z51.81    Z79.01     Plan:   Discussed  current plan face-to-face with patient. For coumadin dosing, elected to change dose to 1 MG 6 days a week and 2 MG one day a week. Will plan to recheck INR in 2 weeks.  I discussed the assessment and treatment plan with the patient. The patient was provided an opportunity to ask questions and all were answered. The patient agreed with the plan and demonstrated an understanding of the instructions.   The patient was advised to call back or seek an in-person evaluation if the symptoms worsen or if the condition fails to improve as anticipated.   I provided 15 minutes of time during this encounter.

## 2018-06-30 NOTE — Patient Instructions (Signed)
Bleeding Precautions When on Anticoagulant Therapy, Adult Anticoagulant therapy, also called blood thinner therapy, is medicine that helps to prevent and treat blood clots. The medicine works by stopping blood clots from forming or growing. Blood clots that form in your blood vessels can be dangerous. They can break loose and travel to the heart, lungs, or brain. This increases the risk of a heart attack, stroke, or blocked lung artery (pulmonary embolism). Anticoagulants also increase the risk of bleeding. Try to protect yourself from cuts and other injuries that can cause bleeding. It is important to take anticoagulants exactly as told by your health care provider. Why do I need to be on anticoagulant therapy? You may need this medicine if you are at risk of developing a blood clot. Conditions that increase your risk of a blood clot include:  Being born with heart disease or a heart malformation (congenital heart disease).  Developing heart disease.  Having had surgery, such as valve replacement.  Having had a serious accident or other type of severe injury (trauma).  Having certain types of cancer.  Having certain diseases that can increase blood clotting.  Having a high risk of stroke or heart attack.  Having atrial fibrillation (AF). What are the common anticoagulant medicines? There are several types of anticoagulant medicines. The most common types are:  Medicines that you take by mouth (oral medicines), such as: ? Warfarin. ? Novel oral anticoagulants (NOACs), such as: ? Direct thrombin inhibitors (dabigatran). ? Factor Xa inhibitors (apixaban, edoxaban, and rivaroxaban).  Injections, such as: ? Unfractionated heparin. ? Low molecular weight heparin. These anticoagulants work in different ways to prevent blood clots. They also have different risks and side effects. What do I need to remember while on anticoagulant therapy? Taking anticoagulants  Take your medicine at the  same time every day. If you forget to take your medicine, take it as soon as you remember. Do not double your dosage of medicine if you miss a whole day. Take your normal dose and call your health care provider.  Do not stop taking your medicine unless your health care provider approves. Stopping the medicine can increase your risk of developing a blood clot. Taking other medicines  Take over-the-counter and prescriptions medicines only as told by your health care provider.  Do not take over-the-counter NSAIDs, including aspirin and ibuprofen, while you are on anticoagulant therapy. These medicines increase your risk of dangerous bleeding.  Get approval from your health care provider before you start taking any new medicines, vitamins, or herbal products. Some of these could interfere with your therapy. General instructions  Keep all follow-up visits as told by your health care provider. This is important.  If you are pregnant or trying to get pregnant, talk with a health care provider about anticoagulants. Some of these medicines are not safe to take during pregnancy.  Tell all health care providers, including your dentist, that you are on anticoagulant therapy. It is especially important to tell providers before you have any surgery, medical procedures, or dental work done. What precautions should I take?   Be very careful when using knives, scissors, or other sharp objects.  Use an electric razor instead of a blade.  Do not use toothpicks.  Use a soft-bristled toothbrush. Brush your teeth gently.  Always wear shoes outdoors and wear slippers indoors.  Be careful when cutting your fingernails and toenails.  Place bath mats in the bathroom. If possible, install handrails as well.  Wear gloves while you do  yard work.  Wear your seat belt.  Prevent falls by removing loose rugs and extension cords from areas where you walk. Use a cane or walker if you need it.  Avoid  constipation by: ? Drinking enough fluid to keep your urine clear or pale yellow. ? Eating foods that are high in fiber, such as fresh fruits and vegetables, whole grains, and beans. ? Limiting foods that are high in fat and processed sugars, such as fried and sweet foods.  Do not play contact sports or participate in other activities that have a high risk for injury. What other precautions are important if on warfarin therapy? If you are taking a type of anticoagulant called warfarin, make sure you:  Work with a diet and nutrition specialist (dietitian) to make an eating plan. Do not make any sudden changes to your diet after you have started your eating plan.  Do not drink alcohol. It can interfere with your medicine and increase your risk of an injury that causes bleeding.  Get regular blood tests as told by your health care provider. What are some questions to ask my health care provider?  Why do I need anticoagulant therapy?  What is the best anticoagulant therapy for my condition?  How long will I need anticoagulant therapy?  What are the side effects of anticoagulant therapy?  When should I take my medicine? What should I do if I forget to take it?  Will I need to have regular blood tests?  Do I need to change my diet? Are there foods or drinks that I should avoid?  What activities are safe for me?  What should I do if I want to get pregnant? Contact a health care provider if:  You miss a dose of medicine: ? And you are not sure what to do. ? For more than one day.  You have: ? Menstrual bleeding that is heavier than normal. ? Bloody or brown urine. ? Easy bruising. ? Black and tarry stool or bright red stool. ? Side effects from your medicine.  You feel weak or dizzy.  You become pregnant. Get help right away if:  You have bleeding that will not stop within 20 minutes from: ? The nose. ? The gums. ? A cut on the skin.  You have a severe headache or  stomachache.  You vomit or cough up blood.  You fall or hit your head. Summary  Anticoagulant therapy, also called blood thinner therapy, is medicine that helps to prevent and treat blood clots.  Anticoagulants work in different ways to prevent blood clots. They also have different risks and side effects.  Talk with your health care provider about any precautions that you should take while on anticoagulant therapy. This information is not intended to replace advice given to you by your health care provider. Make sure you discuss any questions you have with your health care provider. Document Released: 12/30/2014 Document Revised: 04/06/2016 Document Reviewed: 04/06/2016 Elsevier Interactive Patient Education  2019 [employer].

## 2018-07-13 ENCOUNTER — Other Ambulatory Visit: Payer: Self-pay | Admitting: Nurse Practitioner

## 2018-07-14 ENCOUNTER — Encounter: Payer: Self-pay | Admitting: Nurse Practitioner

## 2018-07-14 ENCOUNTER — Ambulatory Visit: Payer: Self-pay | Admitting: Nurse Practitioner

## 2018-07-14 ENCOUNTER — Other Ambulatory Visit: Payer: Self-pay

## 2018-07-14 ENCOUNTER — Ambulatory Visit (INDEPENDENT_AMBULATORY_CARE_PROVIDER_SITE_OTHER): Payer: 59 | Admitting: Nurse Practitioner

## 2018-07-14 ENCOUNTER — Ambulatory Visit: Payer: 59 | Admitting: Rheumatology

## 2018-07-14 ENCOUNTER — Encounter: Payer: Self-pay | Admitting: Rheumatology

## 2018-07-14 VITALS — BP 122/71 | Resp 12 | Ht 71.0 in | Wt 199.0 lb

## 2018-07-14 DIAGNOSIS — E782 Mixed hyperlipidemia: Secondary | ICD-10-CM

## 2018-07-14 DIAGNOSIS — M170 Bilateral primary osteoarthritis of knee: Secondary | ICD-10-CM

## 2018-07-14 DIAGNOSIS — I82519 Chronic embolism and thrombosis of unspecified femoral vein: Secondary | ICD-10-CM | POA: Diagnosis not present

## 2018-07-14 DIAGNOSIS — M3219 Other organ or system involvement in systemic lupus erythematosus: Secondary | ICD-10-CM

## 2018-07-14 DIAGNOSIS — Z79899 Other long term (current) drug therapy: Secondary | ICD-10-CM | POA: Diagnosis not present

## 2018-07-14 DIAGNOSIS — Z7901 Long term (current) use of anticoagulants: Secondary | ICD-10-CM | POA: Diagnosis not present

## 2018-07-14 DIAGNOSIS — M1120 Other chondrocalcinosis, unspecified site: Secondary | ICD-10-CM

## 2018-07-14 DIAGNOSIS — Z5181 Encounter for therapeutic drug level monitoring: Secondary | ICD-10-CM

## 2018-07-14 DIAGNOSIS — Z86718 Personal history of other venous thrombosis and embolism: Secondary | ICD-10-CM

## 2018-07-14 DIAGNOSIS — I10 Essential (primary) hypertension: Secondary | ICD-10-CM

## 2018-07-14 LAB — COAGUCHEK XS/INR WAIVED
INR: 2.1 — ABNORMAL HIGH (ref 0.9–1.1)
Prothrombin Time: 24.7 s

## 2018-07-14 NOTE — Progress Notes (Signed)
BP 113/70   Pulse (!) 59   Temp 98.4 F (36.9 C) (Oral)   Ht 5\' 11"  (1.803 m)   Wt 200 lb (90.7 kg)   SpO2 98%   BMI 27.89 kg/m    Subjective:    Patient ID: Dwayne Jacobson, male    DOB: Aug 11, 1955, 63 y.o.   MRN: [id number]  CC: Coumadin management   . This visit was completed via telephone due to the restrictions of the COVID-19 pandemic. All issues as above were discussed and addressed but no physical exam was performed. If it was felt that the patient should be evaluated in the office, they were directed there. The patient verbally consented to this visit. Patient was unable to complete an audio/visual visit due to Lack of equipment. Due to the catastrophic nature of the COVID-19 pandemic, this visit was done through audio contact only. . Location of the patient: home . Location of the provider: home . Those involved with this call:  . Provider: Marnee Guarneri, DNP . CMA: Gerda Diss, CMA . Front Desk/Registration: Jill Side  . Time spent on call: 15 minutes on the phone discussing health concerns. 5 minutes total spent in review of patient's record and preparation of their chart. I verified patient identity using two factors (patient name and date of birth). Patient consents verbally to being seen via telemedicine visit today.   HPI: This patient is a 62 y.o. male who presents for coumadin management. 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   Last visit INR 2.2 and he wished to try going back to his long time dosing of 1 MG 6 days a week and 2 MG one day a week.  Current INR 2.1 and wishes to maintain his dose.  Present Coumadin dose: Goal: 2.0-3.0 The patient does not have an active anticoagulation episode. Excessive bruising: no Nose bleeding: no Rectal bleeding: no Prolonged menstrual cycles: N/A Eating diet with consistent amounts of foods containing Vitamin K:yes Any recent  antibiotic use? no  Relevant past medical, surgical, family and social history reviewed and updated as indicated. Interim medical history since our last visit reviewed. Allergies and medications reviewed and updated.  ROS: Per HPI unless specifically indicated above     Objective:    BP 113/70   Pulse (!) 59   Temp 98.4 F (36.9 C) (Oral)   Ht 5\' 11"  (1.803 m)   Wt 200 lb (90.7 kg)   SpO2 98%   BMI 27.89 kg/m   Wt Readings from Last 3 Encounters:  07/14/18 200 lb (90.7 kg)  07/14/18 199 lb (90.3 kg)  06/16/18 201 lb (91.2 kg)     General: telephone only, no visual exam  Last INR: 2.2    Last CBC:  Lab Results  Component Value Date   WBC 5.4 09/30/2017   HGB 13.7 09/30/2017   HCT 39.7 09/30/2017   MCV 89 09/30/2017   PLT 333 09/30/2017    Results for orders placed or performed in visit on 06/30/18  CoaguChek XS/INR [name]  Result Value Ref Range   INR 2.2 (H) 0.9 - 1.1   Prothrombin Time 26.5 sec       Assessment:     ICD-10-CM   1. Encounter for monitoring Coumadin therapy  Z51.81 CoaguChek XS/INR [name]   Z79.01   2. 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 For coumadin dosing, elected to continue current dose. Will plan to recheck INR in 1 month.  I discussed the assessment and treatment plan with the patient. The patient was provided an opportunity to ask questions and all were answered. The patient agreed with the plan and demonstrated an understanding of the instructions.   The patient was advised to call back or seek an in-person evaluation if the symptoms worsen or if the condition fails to improve as anticipated.   I provided 15 minutes of time during this encounter.

## 2018-07-14 NOTE — Assessment & Plan Note (Signed)
INR today 2.1.  Continue current dose of 1 MG 6 days a week and 2 MG one day a week.  Follow-up in 4 weeks.

## 2018-07-14 NOTE — Patient Instructions (Signed)
Bleeding Precautions When on Anticoagulant Therapy, Adult Anticoagulant therapy, also called blood thinner therapy, is medicine that helps to prevent and treat blood clots. The medicine works by stopping blood clots from forming or growing. Blood clots that form in your blood vessels can be dangerous. They can break loose and travel to the heart, lungs, or brain. This increases the risk of a heart attack, stroke, or blocked lung artery (pulmonary embolism). Anticoagulants also increase the risk of bleeding. Try to protect yourself from cuts and other injuries that can cause bleeding. It is important to take anticoagulants exactly as told by your health care provider. Why do I need to be on anticoagulant therapy? You may need this medicine if you are at risk of developing a blood clot. Conditions that increase your risk of a blood clot include:  Being born with heart disease or a heart malformation (congenital heart disease).  Developing heart disease.  Having had surgery, such as valve replacement.  Having had a serious accident or other type of severe injury (trauma).  Having certain types of cancer.  Having certain diseases that can increase blood clotting.  Having a high risk of stroke or heart attack.  Having atrial fibrillation (AF). What are the common anticoagulant medicines? There are several types of anticoagulant medicines. The most common types are:  Medicines that you take by mouth (oral medicines), such as: ? Warfarin. ? Novel oral anticoagulants (NOACs), such as: ? Direct thrombin inhibitors (dabigatran). ? Factor Xa inhibitors (apixaban, edoxaban, and rivaroxaban).  Injections, such as: ? Unfractionated heparin. ? Low molecular weight heparin. These anticoagulants work in different ways to prevent blood clots. They also have different risks and side effects. What do I need to remember while on anticoagulant therapy? Taking anticoagulants  Take your medicine at the  same time every day. If you forget to take your medicine, take it as soon as you remember. Do not double your dosage of medicine if you miss a whole day. Take your normal dose and call your health care provider.  Do not stop taking your medicine unless your health care provider approves. Stopping the medicine can increase your risk of developing a blood clot. Taking other medicines  Take over-the-counter and prescriptions medicines only as told by your health care provider.  Do not take over-the-counter NSAIDs, including aspirin and ibuprofen, while you are on anticoagulant therapy. These medicines increase your risk of dangerous bleeding.  Get approval from your health care provider before you start taking any new medicines, vitamins, or herbal products. Some of these could interfere with your therapy. General instructions  Keep all follow-up visits as told by your health care provider. This is important.  If you are pregnant or trying to get pregnant, talk with a health care provider about anticoagulants. Some of these medicines are not safe to take during pregnancy.  Tell all health care providers, including your dentist, that you are on anticoagulant therapy. It is especially important to tell providers before you have any surgery, medical procedures, or dental work done. What precautions should I take?   Be very careful when using knives, scissors, or other sharp objects.  Use an electric razor instead of a blade.  Do not use toothpicks.  Use a soft-bristled toothbrush. Brush your teeth gently.  Always wear shoes outdoors and wear slippers indoors.  Be careful when cutting your fingernails and toenails.  Place bath mats in the bathroom. If possible, install handrails as well.  Wear gloves while you do  yard work.  Wear your seat belt.  Prevent falls by removing loose rugs and extension cords from areas where you walk. Use a cane or walker if you need it.  Avoid  constipation by: ? Drinking enough fluid to keep your urine clear or pale yellow. ? Eating foods that are high in fiber, such as fresh fruits and vegetables, whole grains, and beans. ? Limiting foods that are high in fat and processed sugars, such as fried and sweet foods.  Do not play contact sports or participate in other activities that have a high risk for injury. What other precautions are important if on warfarin therapy? If you are taking a type of anticoagulant called warfarin, make sure you:  Work with a diet and nutrition specialist (dietitian) to make an eating plan. Do not make any sudden changes to your diet after you have started your eating plan.  Do not drink alcohol. It can interfere with your medicine and increase your risk of an injury that causes bleeding.  Get regular blood tests as told by your health care provider. What are some questions to ask my health care provider?  Why do I need anticoagulant therapy?  What is the best anticoagulant therapy for my condition?  How long will I need anticoagulant therapy?  What are the side effects of anticoagulant therapy?  When should I take my medicine? What should I do if I forget to take it?  Will I need to have regular blood tests?  Do I need to change my diet? Are there foods or drinks that I should avoid?  What activities are safe for me?  What should I do if I want to get pregnant? Contact a health care provider if:  You miss a dose of medicine: ? And you are not sure what to do. ? For more than one day.  You have: ? Menstrual bleeding that is heavier than normal. ? Bloody or brown urine. ? Easy bruising. ? Black and tarry stool or bright red stool. ? Side effects from your medicine.  You feel weak or dizzy.  You become pregnant. Get help right away if:  You have bleeding that will not stop within 20 minutes from: ? The nose. ? The gums. ? A cut on the skin.  You have a severe headache or  stomachache.  You vomit or cough up blood.  You fall or hit your head. Summary  Anticoagulant therapy, also called blood thinner therapy, is medicine that helps to prevent and treat blood clots.  Anticoagulants work in different ways to prevent blood clots. They also have different risks and side effects.  Talk with your health care provider about any precautions that you should take while on anticoagulant therapy. This information is not intended to replace advice given to you by your health care provider. Make sure you discuss any questions you have with your health care provider. Document Released: 12/30/2014 Document Revised: 04/06/2016 Document Reviewed: 04/06/2016 Elsevier Interactive Patient Education  2019 [employer].

## 2018-07-15 LAB — CBC WITH DIFFERENTIAL/PLATELET
Basophils Absolute: 0.1 10*3/uL (ref 0.0–0.2)
Basos: 1 %
EOS (ABSOLUTE): 0.2 10*3/uL (ref 0.0–0.4)
Eos: 5 %
Hematocrit: 35.9 % — ABNORMAL LOW (ref 37.5–51.0)
Hemoglobin: 12.8 g/dL — ABNORMAL LOW (ref 13.0–17.7)
Immature Grans (Abs): 0 10*3/uL (ref 0.0–0.1)
Immature Granulocytes: 0 %
Lymphocytes Absolute: 1.1 10*3/uL (ref 0.7–3.1)
Lymphs: 26 %
MCH: 32.4 pg (ref 26.6–33.0)
MCHC: 35.7 g/dL (ref 31.5–35.7)
MCV: 91 fL (ref 79–97)
Monocytes Absolute: 0.7 10*3/uL (ref 0.1–0.9)
Monocytes: 15 %
Neutrophils Absolute: 2.3 10*3/uL (ref 1.4–7.0)
Neutrophils: 53 %
Platelets: 260 10*3/uL (ref 150–450)
RBC: 3.95 x10E6/uL — ABNORMAL LOW (ref 4.14–5.80)
RDW: 12.9 % (ref 11.6–15.4)
WBC: 4.4 10*3/uL (ref 3.4–10.8)

## 2018-07-17 LAB — CBC WITH DIFFERENTIAL/PLATELET
Absolute Monocytes: 735 cells/uL (ref 200–950)
Basophils Absolute: 62 cells/uL (ref 0–200)
Basophils Relative: 1.4 %
Eosinophils Absolute: 238 cells/uL (ref 15–500)
Eosinophils Relative: 5.4 %
HCT: 40.4 % (ref 38.5–50.0)
Hemoglobin: 13.6 g/dL (ref 13.2–17.1)
Lymphs Abs: 972 cells/uL (ref 850–3900)
MCH: 31.2 pg (ref 27.0–33.0)
MCHC: 33.7 g/dL (ref 32.0–36.0)
MCV: 92.7 fL (ref 80.0–100.0)
MPV: 9.7 fL (ref 7.5–12.5)
Monocytes Relative: 16.7 %
Neutro Abs: 2394 cells/uL (ref 1500–7800)
Neutrophils Relative %: 54.4 %
Platelets: 288 10*3/uL (ref 140–400)
RBC: 4.36 10*6/uL (ref 4.20–5.80)
RDW: 13 % (ref 11.0–15.0)
Total Lymphocyte: 22.1 %
WBC: 4.4 10*3/uL (ref 3.8–10.8)

## 2018-07-17 LAB — URINALYSIS, ROUTINE W REFLEX MICROSCOPIC
Bilirubin Urine: NEGATIVE
Glucose, UA: NEGATIVE
Hgb urine dipstick: NEGATIVE
Ketones, ur: NEGATIVE
Leukocytes,Ua: NEGATIVE
Nitrite: NEGATIVE
Protein, ur: NEGATIVE
Specific Gravity, Urine: 1.013 (ref 1.001–1.03)
pH: 6.5 (ref 5.0–8.0)

## 2018-07-17 LAB — COMPLETE METABOLIC PANEL WITH GFR
AG Ratio: 1.7 (calc) (ref 1.0–2.5)
ALT: 33 U/L (ref 9–46)
AST: 31 U/L (ref 10–35)
Albumin: 4.5 g/dL (ref 3.6–5.1)
Alkaline phosphatase (APISO): 62 U/L (ref 35–144)
BUN: 13 mg/dL (ref 7–25)
CO2: 26 mmol/L (ref 20–32)
Calcium: 9.5 mg/dL (ref 8.6–10.3)
Chloride: 98 mmol/L (ref 98–110)
Creat: 0.93 mg/dL (ref 0.70–1.25)
GFR, Est African American: 101 mL/min/{1.73_m2} (ref >=60)
GFR, Est Non African American: 87 mL/min/{1.73_m2} (ref >=60)
Globulin: 2.7 g/dL (calc) (ref 1.9–3.7)
Glucose, Bld: 99 mg/dL (ref 65–99)
Potassium: 4.6 mmol/L (ref 3.5–5.3)
Sodium: 135 mmol/L (ref 135–146)
Total Bilirubin: 0.3 mg/dL (ref 0.2–1.2)
Total Protein: 7.2 g/dL (ref 6.1–8.1)

## 2018-07-17 LAB — VITAMIN D 25 HYDROXY (VIT D DEFICIENCY, FRACTURES): Vit D, 25-Hydroxy: 44 ng/mL (ref 30–100)

## 2018-07-17 LAB — C3 AND C4
C3 Complement: 129 mg/dL (ref 82–185)
C4 Complement: 31 mg/dL (ref 15–53)

## 2018-07-17 LAB — ANTI-DNA ANTIBODY, DOUBLE-STRANDED: ds DNA Ab: 8 IU/mL — ABNORMAL HIGH

## 2018-07-17 LAB — SJOGRENS SYNDROME-A EXTRACTABLE NUCLEAR ANTIBODY: SSA (Ro) (ENA) Antibody, IgG: 1.6 AI — AB

## 2018-07-17 LAB — SEDIMENTATION RATE: Sed Rate: 6 mm/h (ref 0–20)

## 2018-07-17 NOTE — Progress Notes (Signed)
Normal test results noted.  Please call patient and make them aware of normal results and will continue to monitor at regular visits.  Have a great day.  Look forward to seeing you at your next visit.

## 2018-07-17 NOTE — Progress Notes (Signed)
CBC and CMP WNL.  Sed rate WNL.  UA WNL.  Complements WNL.  Vitamin D is within desirable range. Please recommend a maintenance dose of vitamin D.

## 2018-07-17 NOTE — Progress Notes (Signed)
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 Discussed with Dr. Corena Pilgrim.  We will continue to monitor.  Please advise patient to notify us if he develops any new or worsening symptoms.

## 2018-07-24 ENCOUNTER — Telehealth: Payer: Self-pay | Admitting: Nurse Practitioner

## 2018-07-24 NOTE — Telephone Encounter (Signed)
Noted and agree with advice. 

## 2018-07-24 NOTE — Telephone Encounter (Signed)
Pts wife Dwayne Jacobson called and stated that something popped in the pts arm by his bicep and she is very concerned. After speaking with Dr Wynetta Emery I advised wife to take pt to [facility].

## 2018-07-25 DIAGNOSIS — S46219A Strain of muscle, fascia and tendon of other parts of biceps, unspecified arm, initial encounter: Secondary | ICD-10-CM | POA: Insufficient documentation

## 2018-08-18 ENCOUNTER — Encounter: Payer: Self-pay | Admitting: Nurse Practitioner

## 2018-08-18 ENCOUNTER — Ambulatory Visit (INDEPENDENT_AMBULATORY_CARE_PROVIDER_SITE_OTHER): Payer: 59 | Admitting: Nurse Practitioner

## 2018-08-18 ENCOUNTER — Other Ambulatory Visit: Payer: Self-pay

## 2018-08-18 VITALS — BP 132/88 | HR 66 | Temp 98.7°F

## 2018-08-18 DIAGNOSIS — I82519 Chronic embolism and thrombosis of unspecified femoral vein: Secondary | ICD-10-CM

## 2018-08-18 LAB — COAGUCHEK XS/INR WAIVED
INR: 2.5 — ABNORMAL HIGH (ref 0.9–1.1)
Prothrombin Time: 29.8 s

## 2018-08-18 NOTE — Patient Instructions (Signed)
Bleeding Precautions When on Anticoagulant Therapy, Adult °Anticoagulant therapy, also called blood thinner therapy, is medicine that helps to prevent and treat blood clots. The medicine works by stopping blood clots from forming or growing. Blood clots that form in your blood vessels can be dangerous. They can break loose and travel to the heart, lungs, or brain. This increases the risk of a heart attack, stroke, or blocked lung artery (pulmonary embolism). °Anticoagulants also increase the risk of bleeding. Try to protect yourself from cuts and other injuries that can cause bleeding. It is important to take anticoagulants exactly as told by your health care provider. °Why do I need to be on anticoagulant therapy? °You may need this medicine if you are at risk of developing a blood clot. Conditions that increase your risk of a blood clot include: °· Being born with heart disease or a heart malformation (congenital heart disease). °· Developing heart disease. °· Having had surgery, such as valve replacement. °· Having had a serious accident or other type of severe injury (trauma). °· Having certain types of cancer. °· Having certain diseases that can increase blood clotting. °· Having a high risk of stroke or heart attack. °· Having atrial fibrillation (AF). °What are the common anticoagulant medicines? °There are several types of anticoagulant medicines. The most common types are: °· Medicines that you take by mouth (oral medicines), such as: °? Warfarin. °? Novel oral anticoagulants (NOACs), such as: °? Direct thrombin inhibitors (dabigatran). °? Factor Xa inhibitors (apixaban, edoxaban, and rivaroxaban). °· Injections, such as: °? Unfractionated heparin. °? Low molecular weight heparin. °These anticoagulants work in different ways to prevent blood clots. They also have different risks and side effects. °What do I need to remember while on anticoagulant therapy? °Taking anticoagulants °· Take your medicine at the  same time every day. If you forget to take your medicine, take it as soon as you remember. Do not double your dosage of medicine if you miss a whole day. Take your normal dose and call your health care provider. °· Do not stop taking your medicine unless your health care provider approves. Stopping the medicine can increase your risk of developing a blood clot. °Taking other medicines °· Take over-the-counter and prescriptions medicines only as told by your health care provider. °· Do not take over-the-counter NSAIDs, including aspirin and ibuprofen, while you are on anticoagulant therapy. These medicines increase your risk of dangerous bleeding. °· Get approval from your health care provider before you start taking any new medicines, vitamins, or herbal products. Some of these could interfere with your therapy. °General instructions °· Keep all follow-up visits as told by your health care provider. This is important. °· If you are pregnant or trying to get pregnant, talk with a health care provider about anticoagulants. Some of these medicines are not safe to take during pregnancy. °· Tell all health care providers, including your dentist, that you are on anticoagulant therapy. It is especially important to tell providers before you have any surgery, medical procedures, or dental work done. °What precautions should I take? ° °· Be very careful when using knives, scissors, or other sharp objects. °· Use an electric razor instead of a blade. °· Do not use toothpicks. °· Use a soft-bristled toothbrush. Brush your teeth gently. °· Always wear shoes outdoors and wear slippers indoors. °· Be careful when cutting your fingernails and toenails. °· Place bath mats in the bathroom. If possible, install handrails as well. °· Wear gloves while you do   yard work. °· Wear your seat belt. °· Prevent falls by removing loose rugs and extension cords from areas where you walk. Use a cane or walker if you need it. °· Avoid  constipation by: °? Drinking enough fluid to keep your urine clear or pale yellow. °? Eating foods that are high in fiber, such as fresh fruits and vegetables, whole grains, and beans. °? Limiting foods that are high in fat and processed sugars, such as fried and sweet foods. °· Do not play contact sports or participate in other activities that have a high risk for injury. °What other precautions are important if on warfarin therapy? °If you are taking a type of anticoagulant called warfarin, make sure you: °· Work with a diet and nutrition specialist (dietitian) to make an eating plan. Do not make any sudden changes to your diet after you have started your eating plan. °· Do not drink alcohol. It can interfere with your medicine and increase your risk of an injury that causes bleeding. °· Get regular blood tests as told by your health care provider. °What are some questions to ask my health care provider? °· Why do I need anticoagulant therapy? °· What is the best anticoagulant therapy for my condition? °· How long will I need anticoagulant therapy? °· What are the side effects of anticoagulant therapy? °· When should I take my medicine? What should I do if I forget to take it? °· Will I need to have regular blood tests? °· Do I need to change my diet? Are there foods or drinks that I should avoid? °· What activities are safe for me? °· What should I do if I want to get pregnant? °Contact a health care provider if: °· You miss a dose of medicine: °? And you are not sure what to do. °? For more than one day. °· You have: °? Menstrual bleeding that is heavier than normal. °? Bloody or brown urine. °? Easy bruising. °? Black and tarry stool or bright red stool. °? Side effects from your medicine. °· You feel weak or dizzy. °· You become pregnant. °Get help right away if: °· You have bleeding that will not stop within 20 minutes from: °? The nose. °? The gums. °? A cut on the skin. °· You have a severe headache or  stomachache. °· You vomit or cough up blood. °· You fall or hit your head. °Summary °· Anticoagulant therapy, also called blood thinner therapy, is medicine that helps to prevent and treat blood clots. °· Anticoagulants work in different ways to prevent blood clots. They also have different risks and side effects. °· Talk with your health care provider about any precautions that you should take while on anticoagulant therapy. °This information is not intended to replace advice given to you by your health care provider. Make sure you discuss any questions you have with your health care provider. °Document Released: 12/30/2014 Document Revised: 05/10/2018 Document Reviewed: 04/06/2016 °Elsevier Patient Education © 2020 [employer] ° °

## 2018-08-18 NOTE — Progress Notes (Signed)
BP 132/88   Pulse 66   Temp 98.7 F (37.1 C)    Subjective:    Patient ID: Dwayne Jacobson, male    DOB: 04-13-1955, 63 y.o.   MRN: [id number]   . This visit was completed via telephone due to the restrictions of the COVID-19 pandemic. All issues as above were discussed and addressed but no physical exam was performed. If it was felt that the patient should be evaluated in the office, they were directed there. The patient verbally consented to this visit. Patient was unable to complete an audio/visual visit due to Lack of equipment. Due to the catastrophic nature of the COVID-19 pandemic, this visit was done through audio contact only. . Location of the patient: home . Location of the provider: home . Those involved with this call:  . Provider: Marnee Guarneri, DNP . CMA: Tiffany Reel, CMA . Front Desk/Registration: Jill Side  . Time spent on call: 15 minutes on the phone discussing health concerns. 5 minutes total spent in review of patient's record and preparation of their chart.  . I verified patient identity using two factors (patient name and date of birth). Patient consents verbally to being seen via telemedicine visit today.    CC: Coumadin management  HPI: This patient is a 63 y.o. male who presents for coumadin management. 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  Current INR 2.5 and will continues current dose.    Present Coumadin dose: 1 MG 6 days a week and 2 MG one day a week Goal: 2.0-3.0 The patient does not have an active anticoagulation episode. Excessive bruising: no Nose bleeding: no Rectal bleeding: no Prolonged menstrual cycles: N/A Eating diet with consistent amounts of foods containing Vitamin K:yes Any recent antibiotic use? no  Relevant past medical, surgical, family and social history reviewed and updated as indicated. Interim medical history since our last  visit reviewed. Allergies and medications reviewed and updated.  ROS: Per HPI unless specifically indicated above     Objective:    BP 132/88   Pulse 66   Temp 98.7 F (37.1 C)   Wt Readings from Last 3 Encounters:  07/14/18 200 lb (90.7 kg)  07/14/18 199 lb (90.3 kg)  06/16/18 201 lb (91.2 kg)     General: unable to perform due to telephone visit only  Last INR: 2.1    Last CBC:  Lab Results  Component Value Date   WBC 4.4 07/14/2018   HGB 12.8 (L) 07/14/2018   HCT 35.9 (L) 07/14/2018   MCV 91 07/14/2018   PLT 260 07/14/2018    Results for orders placed or performed in visit on 07/14/18  CoaguChek XS/INR [name]  Result Value Ref Range   INR 2.1 (H) 0.9 - 1.1   Prothrombin Time 24.7 sec  CBC with Differential/Platelet  Result Value Ref Range   WBC 4.4 3.4 - 10.8 x10E3/uL   RBC 3.95 (L) 4.14 - 5.80 x10E6/uL   Hemoglobin 12.8 (L) 13.0 - 17.7 g/dL   Hematocrit 35.9 (L) 37.5 - 51.0 %   MCV 91 79 - 97 fL   MCH 32.4 26.6 - 33.0 pg   MCHC 35.7 31.5 - 35.7 g/dL   RDW 12.9 11.6 - 15.4 %   Platelets 260 150 - 450 x10E3/uL   Neutrophils 53 Not Estab. %   Lymphs 26 Not Estab. %   Monocytes 15 Not Estab. %  Eos 5 Not Estab. %   Basos 1 Not Estab. %   Neutrophils Absolute 2.3 1.4 - 7.0 x10E3/uL   Lymphocytes Absolute 1.1 0.7 - 3.1 x10E3/uL   Monocytes Absolute 0.7 0.1 - 0.9 x10E3/uL   EOS (ABSOLUTE) 0.2 0.0 - 0.4 x10E3/uL   Basophils Absolute 0.1 0.0 - 0.2 x10E3/uL   Immature Granulocytes 0 Not Estab. %   Immature Grans (Abs) 0.0 0.0 - 0.1 x10E3/uL       Assessment:     ICD-10-CM   1. 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, unspecified laterality (HCC)  I82.519 CoaguChek XS/INR [name]    CoaguChek XS/INR [name]    Plan:   Discussed current plan face-to-face with patient. For coumadin dosing, elected to continue current dose. Will plan to recheck INR in 1 month.  I discussed the assessment and treatment plan with the patient. The patient was  provided an opportunity to ask questions and all were answered. The patient agreed with the plan and demonstrated an understanding of the instructions.   The patient was advised to call back or seek an in-person evaluation if the symptoms worsen or if the condition fails to improve as anticipated.   I provided 15 minutes of time during this encounter.

## 2018-08-18 NOTE — Assessment & Plan Note (Signed)
INR today 2.5.  Continues current dose of 1 MG 6 days a week and 2 MG one day a week.  Follow-up in 4 weeks.

## 2018-09-27 ENCOUNTER — Ambulatory Visit: Payer: Self-pay | Admitting: Nurse Practitioner

## 2018-10-03 ENCOUNTER — Telehealth: Payer: Self-pay | Admitting: Nurse Practitioner

## 2018-10-03 NOTE — Telephone Encounter (Signed)
That is perfectly okay, yes.:)

## 2018-10-03 NOTE — Telephone Encounter (Signed)
Called pt to set up virtual for Thursday's appt. He is wanting to come in early to do inr. Please advise if this is ok.

## 2018-10-04 ENCOUNTER — Other Ambulatory Visit: Payer: Self-pay | Admitting: Nurse Practitioner

## 2018-10-05 ENCOUNTER — Other Ambulatory Visit: Payer: Self-pay

## 2018-10-05 ENCOUNTER — Encounter: Payer: Self-pay | Admitting: Nurse Practitioner

## 2018-10-05 ENCOUNTER — Ambulatory Visit: Payer: Self-pay | Admitting: Nurse Practitioner

## 2018-10-05 ENCOUNTER — Ambulatory Visit (INDEPENDENT_AMBULATORY_CARE_PROVIDER_SITE_OTHER): Payer: 59 | Admitting: Nurse Practitioner

## 2018-10-05 VITALS — Ht 71.0 in | Wt 200.0 lb

## 2018-10-05 DIAGNOSIS — Z5181 Encounter for therapeutic drug level monitoring: Secondary | ICD-10-CM | POA: Diagnosis not present

## 2018-10-05 DIAGNOSIS — Z7901 Long term (current) use of anticoagulants: Secondary | ICD-10-CM | POA: Diagnosis not present

## 2018-10-05 DIAGNOSIS — E782 Mixed hyperlipidemia: Secondary | ICD-10-CM | POA: Diagnosis not present

## 2018-10-05 LAB — COAGUCHEK XS/INR WAIVED
INR: 2.3 — ABNORMAL HIGH (ref 0.9–1.1)
Prothrombin Time: 27.3 s

## 2018-10-05 NOTE — Addendum Note (Signed)
Addended by: Crissie Figures R on: 10/05/2018 11:04 AM   Modules accepted: Orders

## 2018-10-05 NOTE — Patient Instructions (Signed)
Bleeding Precautions When on Anticoagulant Therapy, Adult °Anticoagulant therapy, also called blood thinner therapy, is medicine that helps to prevent and treat blood clots. The medicine works by stopping blood clots from forming or growing. Blood clots that form in your blood vessels can be dangerous. They can break loose and travel to the heart, lungs, or brain. This increases the risk of a heart attack, stroke, or blocked lung artery (pulmonary embolism). °Anticoagulants also increase the risk of bleeding. Try to protect yourself from cuts and other injuries that can cause bleeding. It is important to take anticoagulants exactly as told by your health care provider. °Why do I need to be on anticoagulant therapy? °You may need this medicine if you are at risk of developing a blood clot. Conditions that increase your risk of a blood clot include: °· Being born with heart disease or a heart malformation (congenital heart disease). °· Developing heart disease. °· Having had surgery, such as valve replacement. °· Having had a serious accident or other type of severe injury (trauma). °· Having certain types of cancer. °· Having certain diseases that can increase blood clotting. °· Having a high risk of stroke or heart attack. °· Having atrial fibrillation (AF). °What are the common anticoagulant medicines? °There are several types of anticoagulant medicines. The most common types are: °· Medicines that you take by mouth (oral medicines), such as: °? Warfarin. °? Novel oral anticoagulants (NOACs), such as: °? Direct thrombin inhibitors (dabigatran). °? Factor Xa inhibitors (apixaban, edoxaban, and rivaroxaban). °· Injections, such as: °? Unfractionated heparin. °? Low molecular weight heparin. °These anticoagulants work in different ways to prevent blood clots. They also have different risks and side effects. °What do I need to remember while on anticoagulant therapy? °Taking anticoagulants °· Take your medicine at the  same time every day. If you forget to take your medicine, take it as soon as you remember. Do not double your dosage of medicine if you miss a whole day. Take your normal dose and call your health care provider. °· Do not stop taking your medicine unless your health care provider approves. Stopping the medicine can increase your risk of developing a blood clot. °Taking other medicines °· Take over-the-counter and prescriptions medicines only as told by your health care provider. °· Do not take over-the-counter NSAIDs, including aspirin and ibuprofen, while you are on anticoagulant therapy. These medicines increase your risk of dangerous bleeding. °· Get approval from your health care provider before you start taking any new medicines, vitamins, or herbal products. Some of these could interfere with your therapy. °General instructions °· Keep all follow-up visits as told by your health care provider. This is important. °· If you are pregnant or trying to get pregnant, talk with a health care provider about anticoagulants. Some of these medicines are not safe to take during pregnancy. °· Tell all health care providers, including your dentist, that you are on anticoagulant therapy. It is especially important to tell providers before you have any surgery, medical procedures, or dental work done. °What precautions should I take? ° °· Be very careful when using knives, scissors, or other sharp objects. °· Use an electric razor instead of a blade. °· Do not use toothpicks. °· Use a soft-bristled toothbrush. Brush your teeth gently. °· Always wear shoes outdoors and wear slippers indoors. °· Be careful when cutting your fingernails and toenails. °· Place bath mats in the bathroom. If possible, install handrails as well. °· Wear gloves while you do   yard work. °· Wear your seat belt. °· Prevent falls by removing loose rugs and extension cords from areas where you walk. Use a cane or walker if you need it. °· Avoid  constipation by: °? Drinking enough fluid to keep your urine clear or pale yellow. °? Eating foods that are high in fiber, such as fresh fruits and vegetables, whole grains, and beans. °? Limiting foods that are high in fat and processed sugars, such as fried and sweet foods. °· Do not play contact sports or participate in other activities that have a high risk for injury. °What other precautions are important if on warfarin therapy? °If you are taking a type of anticoagulant called warfarin, make sure you: °· Work with a diet and nutrition specialist (dietitian) to make an eating plan. Do not make any sudden changes to your diet after you have started your eating plan. °· Do not drink alcohol. It can interfere with your medicine and increase your risk of an injury that causes bleeding. °· Get regular blood tests as told by your health care provider. °What are some questions to ask my health care provider? °· Why do I need anticoagulant therapy? °· What is the best anticoagulant therapy for my condition? °· How long will I need anticoagulant therapy? °· What are the side effects of anticoagulant therapy? °· When should I take my medicine? What should I do if I forget to take it? °· Will I need to have regular blood tests? °· Do I need to change my diet? Are there foods or drinks that I should avoid? °· What activities are safe for me? °· What should I do if I want to get pregnant? °Contact a health care provider if: °· You miss a dose of medicine: °? And you are not sure what to do. °? For more than one day. °· You have: °? Menstrual bleeding that is heavier than normal. °? Bloody or brown urine. °? Easy bruising. °? Black and tarry stool or bright red stool. °? Side effects from your medicine. °· You feel weak or dizzy. °· You become pregnant. °Get help right away if: °· You have bleeding that will not stop within 20 minutes from: °? The nose. °? The gums. °? A cut on the skin. °· You have a severe headache or  stomachache. °· You vomit or cough up blood. °· You fall or hit your head. °Summary °· Anticoagulant therapy, also called blood thinner therapy, is medicine that helps to prevent and treat blood clots. °· Anticoagulants work in different ways to prevent blood clots. They also have different risks and side effects. °· Talk with your health care provider about any precautions that you should take while on anticoagulant therapy. °This information is not intended to replace advice given to you by your health care provider. Make sure you discuss any questions you have with your health care provider. °Document Released: 12/30/2014 Document Revised: 05/10/2018 Document Reviewed: 04/06/2016 °Elsevier Patient Education © 2020 [employer] ° °

## 2018-10-05 NOTE — Assessment & Plan Note (Signed)
Chronic, with long term Coumadin due to h/o DVT. INR today 2.3 and patient wishes to continue current dose, 1 MG six days a week and 2 MG one day a week.  Will plan on recheck of INR in one month, along with CBC and chronic disease labs.  Last CBC, HGB slightly below normal, he denies any bruising or bleeding.

## 2018-10-05 NOTE — Progress Notes (Signed)
Ht 5\' 11"  (1.803 m)   Wt 200 lb (90.7 kg)   BMI 27.89 kg/m    Subjective:    Patient ID: Dwayne Jacobson, male    DOB: [birth year], 63 y.o.   MRN: [id number]   . This visit was completed via telephone due to the restrictions of the COVID-19 pandemic. All issues as above were discussed and addressed but no physical exam was performed. If it was felt that the patient should be evaluated in the office, they were directed there. The patient verbally consented to this visit. Patient was unable to complete an audio/visual visit due to Lack of equipment. Due to the catastrophic nature of the COVID-19 pandemic, this visit was done through audio contact only. . Location of the patient: home . Location of the provider: home . Those involved with this call:  . Provider: Marnee Guarneri, DNP . CMA: Yvonna Alanis, CMA . Front Desk/Registration: Jill Side  . Time spent on call: 15 minutes on the phone discussing health concerns. 5 minutes total spent in review of patient's record and preparation of their chart.  . I verified patient identity using two factors (patient name and date of birth). Patient consents verbally to being seen via telemedicine visit today.    CC: Coumadin management  HPI: This patient is a 63 y.o. male who presents for coumadin management. 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  Present Coumadin dose: 1 MG 6 days a week and 2 MG one day a week Goal: 2.0-3.0 The patient does not have an active anticoagulation episode. Excessive bruising: no Nose bleeding: no Rectal bleeding: no Prolonged menstrual cycles: N/A Eating diet with consistent amounts of foods containing Vitamin K:yes Any recent antibiotic use? no  Relevant past medical, surgical, family and social history reviewed and updated as indicated. Interim medical history since our last visit reviewed. Allergies and  medications reviewed and updated.  ROS: Per HPI unless specifically indicated above     Objective:    Ht 5\' 11"  (1.803 m)   Wt 200 lb (90.7 kg)   BMI 27.89 kg/m   Wt Readings from Last 3 Encounters:  10/05/18 200 lb (90.7 kg)  07/14/18 200 lb (90.7 kg)  07/14/18 199 lb (90.3 kg)     General: Unable to perform, telephone only visit  Last INR: 2.5    Last CBC:  Lab Results  Component Value Date   WBC 4.4 07/14/2018   HGB 12.8 (L) 07/14/2018   HCT 35.9 (L) 07/14/2018   MCV 91 07/14/2018   PLT 260 07/14/2018    Results for orders placed or performed in visit on 08/18/18  CoaguChek XS/INR [name]  Result Value Ref Range   INR 2.5 (H) 0.9 - 1.1   Prothrombin Time 29.8 sec       Assessment:     ICD-10-CM   1. Encounter for monitoring Coumadin therapy  Z51.81 CoaguChek XS/INR [name]   Z79.01 CBC with Differential/Platelet    CoaguChek XS/INR [name]    Anemia panel  2. Mixed hyperlipidemia  E78.2 Lipid Panel [name], [name]    Plan:   Discussed current plan via telephone with patient. For coumadin dosing, elected to continue current dose. Will plan to recheck INR in 1 month.  I discussed the assessment and treatment plan with the patient. The patient was provided an opportunity to ask questions and all were answered. The patient agreed with the plan  and demonstrated an understanding of the instructions.   The patient was advised to call back or seek an in-person evaluation if the symptoms worsen or if the condition fails to improve as anticipated.   I provided 15 minutes of time during this encounter.

## 2018-11-07 ENCOUNTER — Encounter: Payer: Self-pay | Admitting: Nurse Practitioner

## 2018-11-07 ENCOUNTER — Other Ambulatory Visit: Payer: Self-pay | Admitting: Nurse Practitioner

## 2018-11-07 ENCOUNTER — Ambulatory Visit: Payer: 59 | Admitting: Nurse Practitioner

## 2018-11-07 ENCOUNTER — Other Ambulatory Visit: Payer: Self-pay

## 2018-11-07 VITALS — BP 127/80 | HR 54 | Temp 97.8°F

## 2018-11-07 DIAGNOSIS — Z7901 Long term (current) use of anticoagulants: Secondary | ICD-10-CM

## 2018-11-07 DIAGNOSIS — I82519 Chronic embolism and thrombosis of unspecified femoral vein: Secondary | ICD-10-CM | POA: Diagnosis not present

## 2018-11-07 DIAGNOSIS — Z23 Encounter for immunization: Secondary | ICD-10-CM | POA: Diagnosis not present

## 2018-11-07 DIAGNOSIS — E782 Mixed hyperlipidemia: Secondary | ICD-10-CM

## 2018-11-07 DIAGNOSIS — I10 Essential (primary) hypertension: Secondary | ICD-10-CM | POA: Diagnosis not present

## 2018-11-07 DIAGNOSIS — M3219 Other organ or system involvement in systemic lupus erythematosus: Secondary | ICD-10-CM | POA: Diagnosis not present

## 2018-11-07 DIAGNOSIS — R1310 Dysphagia, unspecified: Secondary | ICD-10-CM

## 2018-11-07 DIAGNOSIS — Z5181 Encounter for therapeutic drug level monitoring: Secondary | ICD-10-CM

## 2018-11-07 LAB — COAGUCHEK XS/INR WAIVED
INR: 2.1 — ABNORMAL HIGH (ref 0.9–1.1)
Prothrombin Time: 24.8 s

## 2018-11-07 LAB — LIPID PANEL PICCOLO, WAIVED
Chol/HDL Ratio Piccolo,Waive: 3.4 mg/dL
Cholesterol Piccolo, Waived: 161 mg/dL (ref <200)
HDL Chol Piccolo, Waived: 47 mg/dL — ABNORMAL LOW (ref >59)
LDL Chol Calc Piccolo Waived: 91 mg/dL (ref <100)
Triglycerides Piccolo,Waived: 113 mg/dL (ref <150)
VLDL Chol Calc Piccolo,Waive: 23 mg/dL (ref <30)

## 2018-11-07 MED ORDER — OMEPRAZOLE 20 MG PO CPDR: 20.0000 mg | DELAYED_RELEASE_CAPSULE | Freq: Every day | ORAL | 3 refills | Status: DC

## 2018-11-07 NOTE — Progress Notes (Signed)
BP 127/80   Pulse (!) 54   Temp 97.8 F (36.6 C) (Oral)   SpO2 100%    Subjective:    Patient ID: Dwayne Jacobson, male    DOB: 1955-08-16, 63 y.o.   MRN: [id number]  CC: Coumadin management + Chronic Disease visit  HPI: This patient is a 63 y.o. male who presents for coumadin management. 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  Present Coumadin dose:  1 MG 6 days a week and 2 MG one day a week Goal: 2.0-3.0 The patient does not have an active anticoagulation episode. Excessive bruising: no Nose bleeding: no Rectal bleeding: no Prolonged menstrual cycles: N/A Eating diet with consistent amounts of foods containing Vitamin K:yes Any recent antibiotic use? no   HYPERTENSION / HYPERLIPIDEMIA Continues on Lisinopril, HCTZ, Atorvastatin, and Amlodipine. Satisfied with current treatment? yes Duration of hypertension: chronic BP monitoring frequency: daily BP range: not checking BP medication side effects: no Duration of hyperlipidemia: chronic Cholesterol medication side effects: no Cholesterol supplements: none Medication compliance: good compliance Aspirin: no Recent stressors: no Recurrent headaches: no Visual changes: no Palpitations: no Dyspnea: no Chest pain: no Lower extremity edema: no Dizzy/lightheaded: no   DYSPHAGIA Over the past few months has had issues swallowing steak, only steak and not other foods or fluids.  He endorses cutting it in small pieces and chewing well.  Reports that occasionally he has issues with heart burn in the middle of the night, but no other times.  Eats dinner at 8 pm and goes to bed at 9-9:30 pm.  Does take TUMS at times, which does help.  History of smoking, quit 5 years go.  Denies current heavy alcohol use. Duration: months Description of symptom: Onset: Immediately upon swallowing Location of dysphagia: throat Dysphagia to solids only:  yes Dysphagia to solids & liquids: no  Frequency:intermittent  With eating steak Progressively getting worse: yes Alleviatiating factors: vomitting Provoking factors: certain foods Status: worse EGD: no Weight loss: no Sensation of lump in throat: yes Heartburn: yes Odynophagia: no Nausea: yes Vomiting: yes Drooling/nasal regurgitation/food spillage: no Coughing/choking/dysphonia: yes Dysarthria: no Hematemesis: no Regurgitation of undigested food/halitosis: no Chest pain: no  Relevant past medical, surgical, family and social history reviewed and updated as indicated. Interim medical history since our last visit reviewed. Allergies and medications reviewed and updated.  ROS: Per HPI unless specifically indicated above     Objective:    BP 127/80   Pulse (!) 54   Temp 97.8 F (36.6 C) (Oral)   SpO2 100%   Wt Readings from Last 3 Encounters:  10/05/18 200 lb (90.7 kg)  07/14/18 200 lb (90.7 kg)  07/14/18 199 lb (90.3 kg)     Physical Exam  Constitutional: He is oriented to person, place, and time. He appears well-developed and well-nourished.  HENT:  Head: Normocephalic.  Eyes: Pupils are equal, round, and reactive to light. EOM are normal.  Neck: Normal range of motion. No thyromegaly present.  Cardiovascular: Normal rate. Exam reveals no gallop.  No murmur heard. Respiratory: Effort normal and breath sounds normal.  GI: Soft. He exhibits no distension and no mass. There is no abdominal tenderness. There is no guarding.  Musculoskeletal: Normal range of motion.  Lymphadenopathy:    He has no cervical adenopathy.  Neurological: He is alert and oriented to person, place, and time.  Skin: Skin is warm and dry.  Psychiatric: He  has a normal mood and affect. His behavior is normal. Judgment and thought content normal.    Last INR: 2.3    Last CBC:  Lab Results  Component Value Date   WBC 4.4 07/14/2018   HGB 12.8 (L) 07/14/2018   HCT 35.9 (L) 07/14/2018    MCV 91 07/14/2018   PLT 260 07/14/2018    Results for orders placed or performed in visit on 10/05/18  CoaguChek XS/INR [name]  Result Value Ref Range   INR 2.3 (H) 0.9 - 1.1   Prothrombin Time 27.3 sec       Assessment:     ICD-10-CM   1. Other systemic lupus erythematosus with other organ involvement ([place])  M32.19   2. 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 Encounter for monitoring Coumadin therapy  Z51.81 Anemia panel   Z79.01 CBC with Differential/Platelet    CoaguChek XS/INR [name]  4. Essential hypertension  I10   5. Mixed hyperlipidemia  E78.2 Lipid Panel [name], [name]    Comp Met (CMET)  6. Dysphagia, unspecified type  R13.10 Ambulatory referral to Gastroenterology    Thyroid Panel With TSH  7. Need for influenza vaccination  Z23 Flu Vaccine QUAD 36+ mos IM    Plan:   Discussed current plan face-to-face with patient. For coumadin dosing, elected to continue current dose. Will plan to recheck INR in 1 month.

## 2018-11-07 NOTE — Assessment & Plan Note (Signed)
Chronic, ongoing.  Continue current medication regimen and adjust as needed.  Lipid panel and CMP today.    

## 2018-11-07 NOTE — Telephone Encounter (Signed)
Requested medication (s) are due for refill today: yes  Requested medication (s) are on the active medication list: yes  Last refill:  11/07/2018  Future visit scheduled: yes  Notes to clinic:  Review for refill. Looks like a different strength was sent in today   Requested Prescriptions  Pending Prescriptions Disp Refills   pantoprazole (PROTONIX) 40 MG tablet [Pharmacy Med Name: PANTOPRAZOLE SOD DR 40 MG TAB]  0    Sig: Please specify directions, refills and quantity     Gastroenterology: Proton Pump Inhibitors Passed - 11/07/2018  9:50 AM      Passed - Valid encounter within last 12 months    Recent Outpatient Visits          Today Need for influenza vaccination   [facility] [place], Barbaraann Faster, NP   1 month ago Encounter for monitoring Coumadin therapy   [place], [place] T, NP   2 months ago 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

## 2018-11-07 NOTE — Assessment & Plan Note (Signed)
INR today 2.1.  Continues current dose of 1 MG 6 days a week and 2 MG one day a week.  Continue collaboration with Dr. Estanislado Pandy.  Follow-up in 4 weeks.

## 2018-11-07 NOTE — Assessment & Plan Note (Signed)
New onset over past 3 months with worsening.  Check thyroid level and CMP today.  Start Prilosec 20 MG daily and discussed monitoring symptoms + ensuring to eat earlier to provider more time before going to sleep.   Referral to GI for further assessment due to worsening symptoms.

## 2018-11-07 NOTE — Assessment & Plan Note (Signed)
Chronic, stable with BP below goal.  Continue current medication regimen and adjust as needed. CMP today.   

## 2018-11-07 NOTE — Assessment & Plan Note (Signed)
Continue collaboration with Dr. Estanislado Pandy.

## 2018-11-07 NOTE — Telephone Encounter (Signed)
Routing to provider regarding Rx

## 2018-11-07 NOTE — Patient Instructions (Signed)

## 2018-11-09 LAB — ANEMIA PANEL
Ferritin: 919 ng/mL — ABNORMAL HIGH (ref 30–400)
Folate, Hemolysate: >620 ng/mL
Folate, RBC: >1566 ng/mL (ref >498)
Hematocrit: 39.6 % (ref 37.5–51.0)
Iron Saturation: 28 % (ref 15–55)
Iron: 81 ug/dL (ref 38–169)
Retic Ct Pct: 1.1 % (ref 0.6–2.6)
Total Iron Binding Capacity: 293 ug/dL (ref 250–450)
UIBC: 212 ug/dL (ref 111–343)
Vitamin B-12: 476 pg/mL (ref 232–1245)

## 2018-11-09 LAB — CBC WITH DIFFERENTIAL/PLATELET
Basophils Absolute: 0 10*3/uL (ref 0.0–0.2)
Basos: 1 %
EOS (ABSOLUTE): 0.2 10*3/uL (ref 0.0–0.4)
Eos: 5 %
Hemoglobin: 13.6 g/dL (ref 13.0–17.7)
Immature Grans (Abs): 0 10*3/uL (ref 0.0–0.1)
Immature Granulocytes: 0 %
Lymphocytes Absolute: 0.9 10*3/uL (ref 0.7–3.1)
Lymphs: 25 %
MCH: 31.3 pg (ref 26.6–33.0)
MCHC: 34.3 g/dL (ref 31.5–35.7)
MCV: 91 fL (ref 79–97)
Monocytes Absolute: 0.5 10*3/uL (ref 0.1–0.9)
Monocytes: 14 %
Neutrophils Absolute: 2.1 10*3/uL (ref 1.4–7.0)
Neutrophils: 55 %
Platelets: 292 10*3/uL (ref 150–450)
RBC: 4.35 x10E6/uL (ref 4.14–5.80)
RDW: 12.8 % (ref 11.6–15.4)
WBC: 3.8 10*3/uL (ref 3.4–10.8)

## 2018-11-09 LAB — COMPREHENSIVE METABOLIC PANEL
ALT: 41 IU/L (ref 0–44)
AST: 33 IU/L (ref 0–40)
Albumin/Globulin Ratio: 1.8 (ref 1.2–2.2)
Albumin: 4.7 g/dL (ref 3.8–4.8)
Alkaline Phosphatase: 78 IU/L (ref 39–117)
BUN/Creatinine Ratio: 16 (ref 10–24)
BUN: 14 mg/dL (ref 8–27)
Bilirubin Total: 0.2 mg/dL (ref 0.0–1.2)
CO2: 25 mmol/L (ref 20–29)
Calcium: 9.8 mg/dL (ref 8.6–10.2)
Chloride: 94 mmol/L — ABNORMAL LOW (ref 96–106)
Creatinine, Ser: 0.87 mg/dL (ref 0.76–1.27)
GFR calc Af Amer: 106 mL/min/{1.73_m2} (ref >59)
GFR calc non Af Amer: 92 mL/min/{1.73_m2} (ref >59)
Globulin, Total: 2.6 g/dL (ref 1.5–4.5)
Glucose: 97 mg/dL (ref 65–99)
Potassium: 4.5 mmol/L (ref 3.5–5.2)
Sodium: 133 mmol/L — ABNORMAL LOW (ref 134–144)
Total Protein: 7.3 g/dL (ref 6.0–8.5)

## 2018-11-09 LAB — THYROID PANEL WITH TSH
Free Thyroxine Index: 1.7 (ref 1.2–4.9)
T3 Uptake Ratio: 28 % (ref 24–39)
T4, Total: 5.9 ug/dL (ref 4.5–12.0)
TSH: 2.16 u[IU]/mL (ref 0.450–4.500)

## 2018-11-30 ENCOUNTER — Other Ambulatory Visit: Payer: Self-pay | Admitting: Family Medicine

## 2018-11-30 NOTE — Telephone Encounter (Signed)
Requested medication (s) are due for refill today: yes  Requested medication (s) are on the active medication list: yes  Last refill:  04/22/2018  Future visit scheduled: yes  Notes to clinic:  Refill cannot be delegated    Requested Prescriptions  Pending Prescriptions Disp Refills   warfarin (COUMADIN) 2 MG tablet [Pharmacy Med Name: WARFARIN SODIUM 2 MG TABLET] 18 tablet 19    Sig: TAKE 1 TABLET 2 DAYS A WEEK THEN TAKE 1/2 TAB OTHER 5 DAYS OF WEEK OR AS DIRECTED BY COUMADIN CLINIC     Hematology:  Anticoagulants - warfarin Failed - 11/30/2018 10:00 AM      Failed - This refill cannot be delegated      Failed - If the patient is managed by Coumadin Clinic - route to their Pool. If not, forward to the provider.      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

## 2018-12-01 NOTE — Progress Notes (Signed)
Office Visit Note  Patient: Dwayne Jacobson             Date of Birth: 1955-03-06           MRN: [id number]             PCP: Venita Lick, NP Referring: Venita Lick, NP Visit Date: 12/15/2018 Occupation: @GUAROCC @  Subjective:  Medication monitoring   History of Present Illness: Dwayne Jacobson is a 63 y.o. male with history of systemic lupus erythematosus and osteoarthritis.  He is taking Plaquenil 200 mg 1 tablet by mouth daily.  He reports he occasionally has pain in both elbow joints and both shoulder joints.  He denies any joint swelling.  He has persistent nasal ulcerations but no oral ulcerations.  He has eye dryness but no mouth dryness. He denies any recent rashes.   Activities of Daily Living:  Patient reports morning stiffness for 0 none.   Patient Denies nocturnal pain.  Difficulty dressing/grooming: Denies Difficulty climbing stairs: Denies Difficulty getting out of chair: Denies Difficulty using hands for taps, buttons, cutlery, and/or writing: Denies  Review of Systems  Constitutional: Positive for fatigue. Negative for night sweats.  HENT: Negative for mouth sores, mouth dryness and nose dryness.   Eyes: Positive for dryness. Negative for redness.  Respiratory: Negative for cough, hemoptysis, shortness of breath and difficulty breathing.   Cardiovascular: Negative for chest pain, palpitations, hypertension, irregular heartbeat and swelling in legs/feet.  Gastrointestinal: Negative for blood in stool, constipation and diarrhea.  Endocrine: Negative for excessive thirst and increased urination.  Genitourinary: Negative for difficulty urinating and painful urination.  Musculoskeletal: Positive for arthralgias and joint pain. Negative for joint swelling, myalgias, muscle weakness, morning stiffness, muscle tenderness and myalgias.  Skin: Negative for color change, rash, hair loss, nodules/bumps, skin tightness, ulcers and sensitivity to sunlight.   Allergic/Immunologic: Negative for susceptible to infections.  Neurological: Negative for dizziness, fainting, memory loss, night sweats and weakness.  Hematological: Negative for bruising/bleeding tendency and swollen glands.  Psychiatric/Behavioral: Positive for sleep disturbance. Negative for depressed mood. The patient is not nervous/anxious.     PMFS History:  Patient Active Problem List   Diagnosis Date Noted  . Dysphagia 11/07/2018  . Encounter for monitoring Coumadin therapy 04/01/2017  . Chondrocalcinosis 01/13/2017  . Primary osteoarthritis of both knees 01/13/2017  . DVT (deep venous thrombosis) ([place])   . Hypertension   . Hyperlipidemia   . SLE (systemic lupus erythematosus) ([place]) 01/18/2014    Past Medical History:  Diagnosis Date  . DVT (deep venous thrombosis) ([place])   . Hyperlipidemia   . Hypertension   . Seizures ([place])   . Skin cancer   . Systemic lupus erythematosus (HCC)     Family History  Problem Relation Age of Onset  . Diabetes Mother   . Hypertension Mother   . Hyperlipidemia Mother   . Stroke Mother   . Lung disease Mother   . Heart disease Mother   . Heart disease Father   . Lung disease Father   . Diabetes Sister    History reviewed. No pertinent surgical history. Social History   Social History Narrative  . Not on file   Immunization History  Administered Date(s) Administered  . Influenza,inj,Quad PF,6+ Mos 01/03/2015, 11/05/2016, 11/07/2018  . Pneumococcal Polysaccharide-23 09/29/2012  . Td 03/25/2006, 08/06/2016     Objective: Vital Signs: BP 118/63 (BP Location: Left Arm, Patient Position: Sitting, Cuff Size: Normal)   Pulse (!) 54  Resp 16   Ht 5\' 11"  (1.803 m)   Wt 197 lb 6.4 oz (89.5 kg)   BMI 27.53 kg/m    Physical Exam Vitals signs and nursing note reviewed.  Constitutional:      Appearance: He is well-developed.  HENT:     Head: Normocephalic and atraumatic.  Eyes:     Conjunctiva/sclera: Conjunctivae normal.      Pupils: Pupils are equal, round, and reactive to light.  Neck:     Musculoskeletal: Normal range of motion and neck supple.  Cardiovascular:     Rate and Rhythm: Normal rate and regular rhythm.     Heart sounds: Normal heart sounds.  Pulmonary:     Effort: Pulmonary effort is normal.     Breath sounds: Normal breath sounds.  Abdominal:     General: Bowel sounds are normal.     Palpations: Abdomen is soft.  Skin:    General: Skin is warm and dry.     Capillary Refill: Capillary refill takes 2 to 3 seconds.  Neurological:     Mental Status: He is alert and oriented to person, place, and time.  Psychiatric:        Behavior: Behavior normal.      Musculoskeletal Exam: C-spine, thoracic spine, and lumbar spine good ROM.  No midline spinal tenderness.  Shoulder joints, elbow joints, wrist joints, MCPs, PIPs, and DIPs good ROM with no synovitis.  No tenderness or inflammation of both elbow joints.  Complete fist formation bilaterally. Hip joints, knee joints, ankle joints, MTPs, PIPs, and DIPs good ROM with no synovitis.  No warmth or effusion of knee joints.  No tenderness of swelling of ankle joints.   CDAI Exam: CDAI Score: - Patient Global: -; Provider Global: - Swollen: -; Tender: - Joint Exam   No joint exam has been documented for this visit   There is currently no information documented on the homunculus. Go to the Rheumatology activity and complete the homunculus joint exam.  Investigation: No additional findings.  Imaging: No results found.  Recent Labs: Lab Results  Component Value Date   WBC 3.8 11/07/2018   HGB 13.6 11/07/2018   PLT 292 11/07/2018   NA 133 (L) 11/07/2018   K 4.5 11/07/2018   CL 94 (L) 11/07/2018   CO2 25 11/07/2018   GLUCOSE 97 11/07/2018   BUN 14 11/07/2018   CREATININE 0.87 11/07/2018   BILITOT 0.2 11/07/2018   ALKPHOS 78 11/07/2018   AST 33 11/07/2018   ALT 41 11/07/2018   PROT 7.3 11/07/2018   ALBUMIN 4.7 11/07/2018   CALCIUM 9.8  11/07/2018   GFRAA 106 11/07/2018    Speciality Comments: PLQ Eye Exam: 10/29/16 WNL @ [place]  Procedures:  No procedures performed Allergies: Patient has no known allergies.   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 History of rash, diagnosed with lupus in the past: He has not had any recent lupus flares.  He is clinically doing well on Plaquenil 200 mg 1 tablet by mouth daily.  He has no synovitis on exam.  He has occasional discomfort in bilateral elbow joints and bilateral shoulder joints.  He has no tenderness or inflammation on exam.  He has not had any recent rashes.  He continues to have recurrent nasal ulcerations but no oral ulcerations.  He has chronic eye dryness but no mouth dryness.  He has not  had any symptoms of Raynaud's recently.  Capillary refill was delayed at 2 to 3 seconds.  No digital ulcerations or signs of gangrene were noted.  He has not had any worsening fatigue, recent fevers, or enlarged lymph nodes.  He has not had any shortness of breath or chest pain.  He will continue taking Plaquenil 200 mg 1 tablet by mouth daily.  We will check autoimmune lab work as follow-up visit.  He was advised to notify us if he develops any signs or symptoms of a lupus flare.  He will follow-up in the office in 5 months.  High risk medication use - Plaquenil 200 mg 1 tablet by mouth daily. CBC and CMP were drawn on 11/29/18.  He had a PLQ eye exam this past summer according to the patient.  We will call [facility] center to get records.    Primary osteoarthritis of both knees: He has good ROM with no discomfort.  No warmth or effusion of knee joints.    Chondrocalcinosis: No flares.   Other medical conditions are listed as follows:   History of DVT (deep vein thrombosis)  Essential hypertension  Mixed hyperlipidemia  Orders: No orders of the defined types were  placed in this encounter.  No orders of the defined types were placed in this encounter.     Follow-Up Instructions: Return in about 5 months (around 05/15/2019) for Systemic lupus erythematosus, Osteoarthritis.   Ofilia Neas, PA-C   I examined and evaluated the patient with Hazel Sams PA.  Patient continues to have some Raynaud's symptoms.  She had no synovitis on examination today.  We will continue current treatment.  The plan of care was discussed as noted above.  Bo Merino, MD  Note - This record has been created using Editor, commissioning.  Chart creation errors have been sought, but may not always  have been located. Such creation errors do not reflect on  the standard of medical care.

## 2018-12-01 NOTE — Telephone Encounter (Signed)
Routing to provider  

## 2018-12-07 ENCOUNTER — Other Ambulatory Visit: Payer: Self-pay

## 2018-12-08 ENCOUNTER — Encounter: Payer: Self-pay | Admitting: Nurse Practitioner

## 2018-12-08 ENCOUNTER — Ambulatory Visit (INDEPENDENT_AMBULATORY_CARE_PROVIDER_SITE_OTHER): Payer: 59 | Admitting: Nurse Practitioner

## 2018-12-08 ENCOUNTER — Ambulatory Visit: Payer: 59 | Admitting: Nurse Practitioner

## 2018-12-08 ENCOUNTER — Other Ambulatory Visit: Payer: Self-pay

## 2018-12-08 VITALS — BP 131/78 | HR 62 | Temp 98.6°F | Ht 71.0 in | Wt 195.0 lb

## 2018-12-08 DIAGNOSIS — I82419 Acute embolism and thrombosis of unspecified femoral vein: Secondary | ICD-10-CM

## 2018-12-08 DIAGNOSIS — R1310 Dysphagia, unspecified: Secondary | ICD-10-CM | POA: Diagnosis not present

## 2018-12-08 LAB — COAGUCHEK XS/INR WAIVED
INR: 3.3 — ABNORMAL HIGH (ref 0.9–1.1)
Prothrombin Time: 40 s

## 2018-12-08 NOTE — Assessment & Plan Note (Addendum)
INR today 3.3.  Continue current dose of 1 MG 6 days a week and 2 MG one day a week. He wishes to focus on his diet and greens as feels this has been issue, does not wish to change dose due to sensitivity to changes. Continue collaboration with Dr. Estanislado Pandy.  Follow-up in 2 weeks or sooner if any issues.

## 2018-12-08 NOTE — Progress Notes (Signed)
BP 131/78   Pulse 62   Temp 98.6 F (37 C) (Oral)   Ht 5\' 11"  (1.803 m)   Wt 195 lb (88.5 kg)   SpO2 100%   BMI 27.20 kg/m    Subjective:    Patient ID: Dwayne Jacobson, male    DOB: [birth year], 63 y.o.   MRN: [id number]  CC: Coumadin management  HPI: This patient is a 63 y.o. male who presents for coumadin management. 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  Present Coumadin dose: 1 MG 6 days a week and 2 MG one day a week (his long term dose, very sensitive to changes in dose) Goal: 2.0-3.0 The patient does not have an active anticoagulation episode. Excessive bruising: no Nose bleeding: no Rectal bleeding: no Prolonged menstrual cycles: N/A Eating diet with consistent amounts of foods containing Vitamin K:no Any recent antibiotic use? no   DYSPHAGIA Reports this has improved as has avoided eating steak, states steak was main trigger and he was choking on it no matter how small the piece.  Taking Protonix 40 MG daily.  Is scheduled to see GI in December. Description of symptom: noted only with steak, gets caught up Onset: Immediately upon swallowing Location of dysphagia: throat Dysphagia to solids only: yes Dysphagia to solids & liquids: no  Frequency:intermittent  With eating steak Progressively getting worse: yes Alleviatiating factors: vomitting Provoking factors: certain foods Status: worse EGD: no Weight loss: no Sensation of lump in throat: yes Heartburn: yes Odynophagia: no Nausea: yes Vomiting: yes Drooling/nasal regurgitation/food spillage: no Coughing/choking/dysphonia: yes Dysarthria: no Hematemesis: no Regurgitation of undigested food/halitosis: no Chest pain: no  Relevant past medical, surgical, family and social history reviewed and updated as indicated. Interim medical history since our last visit reviewed. Allergies and medications reviewed and  updated.  ROS: Per HPI unless specifically indicated above     Objective:    BP 131/78   Pulse 62   Temp 98.6 F (37 C) (Oral)   Ht 5\' 11"  (1.803 m)   Wt 195 lb (88.5 kg)   SpO2 100%   BMI 27.20 kg/m   Wt Readings from Last 3 Encounters:  12/08/18 195 lb (88.5 kg)  10/05/18 200 lb (90.7 kg)  07/14/18 200 lb (90.7 kg)     General: Well appearing, well nourished in no distress.  Normal mood and affect. Skin: No excessive bruising or rash  Last INR: 2.1    Last CBC:  Lab Results  Component Value Date   WBC 3.8 11/07/2018   HGB 13.6 11/07/2018   HCT 39.6 11/07/2018   MCV 91 11/07/2018   PLT 292 11/07/2018    Results for orders placed or performed in visit on 11/07/18  Lipid Panel [name], [employer]  Result Value Ref Range   Cholesterol [name], [name] 161 <200 mg/dL   HDL Chol [name], [name] 47 (L) >59 mg/dL   Triglycerides [name],[name] 113 <150 mg/dL   Chol/HDL Ratio [name],Waive 3.4 mg/dL   LDL Chol Calc [name] [name] 91 <100 mg/dL   VLDL Chol Calc [name],Waive 23 <30 mg/dL  Anemia panel  Result Value Ref Range   Total Iron Binding Capacity 293 250 - 450 ug/dL   UIBC 212 111 - 343 ug/dL   Iron 81 38 - 169 ug/dL   Iron Saturation 28 15 - 55 %   Vitamin B-12 476 232 - 1,245 pg/mL   Folate, Hemolysate >620.0 Not  Estab. ng/mL   Hematocrit 39.6 37.5 - 51.0 %   Folate, RBC >1566 >498 ng/mL   Ferritin 919 (H) 30 - 400 ng/mL   Retic Ct Pct 1.1 0.6 - 2.6 %  CBC with Differential/Platelet  Result Value Ref Range   WBC 3.8 3.4 - 10.8 x10E3/uL   RBC 4.35 4.14 - 5.80 x10E6/uL   Hemoglobin 13.6 13.0 - 17.7 g/dL   MCV 91 79 - 97 fL   MCH 31.3 26.6 - 33.0 pg   MCHC 34.3 31.5 - 35.7 g/dL   RDW 12.8 11.6 - 15.4 %   Platelets 292 150 - 450 x10E3/uL   Neutrophils 55 Not Estab. %   Lymphs 25 Not Estab. %   Monocytes 14 Not Estab. %   Eos 5 Not Estab. %   Basos 1 Not Estab. %   Neutrophils Absolute 2.1 1.4 - 7.0 x10E3/uL   Lymphocytes Absolute 0.9 0.7 - 3.1  x10E3/uL   Monocytes Absolute 0.5 0.1 - 0.9 x10E3/uL   EOS (ABSOLUTE) 0.2 0.0 - 0.4 x10E3/uL   Basophils Absolute 0.0 0.0 - 0.2 x10E3/uL   Immature Granulocytes 0 Not Estab. %   Immature Grans (Abs) 0.0 0.0 - 0.1 x10E3/uL  CoaguChek XS/INR [name]  Result Value Ref Range   INR 2.1 (H) 0.9 - 1.1   Prothrombin Time 24.8 sec  Comprehensive metabolic panel  Result Value Ref Range   Glucose 97 65 - 99 mg/dL   BUN 14 8 - 27 mg/dL   Creatinine, Ser 0.87 0.76 - 1.27 mg/dL   GFR calc non Af Amer 92 >59 mL/min/1.73   GFR calc Af Amer 106 >59 mL/min/1.73   BUN/Creatinine Ratio 16 10 - 24   Sodium 133 (L) 134 - 144 mmol/L   Potassium 4.5 3.5 - 5.2 mmol/L   Chloride 94 (L) 96 - 106 mmol/L   CO2 25 20 - 29 mmol/L   Calcium 9.8 8.6 - 10.2 mg/dL   Total Protein 7.3 6.0 - 8.5 g/dL   Albumin 4.7 3.8 - 4.8 g/dL   Globulin, Total 2.6 1.5 - 4.5 g/dL   Albumin/Globulin Ratio 1.8 1.2 - 2.2   Bilirubin Total 0.2 0.0 - 1.2 mg/dL   Alkaline Phosphatase 78 39 - 117 IU/L   AST 33 0 - 40 IU/L   ALT 41 0 - 44 IU/L  Thyroid Panel With TSH  Result Value Ref Range   TSH 2.160 0.450 - 4.500 uIU/mL   T4, Total 5.9 4.5 - 12.0 ug/dL   T3 Uptake Ratio 28 24 - 39 %   Free Thyroxine Index 1.7 1.2 - 4.9       Assessment:     ICD-10-CM   1. Deep vein thrombosis (DVT) of femoral vein, unspecified chronicity, unspecified laterality (HCC)  I82.419 CoaguChek XS/INR [name] (STAT)  2. Dysphagia, unspecified type  R13.10     Plan:   Discussed current plan face-to-face with patient. For coumadin dosing, elected to continue current dose, he wishes to focus on his diet and greens as feels this has been issue, does not wish to change dose due to sensitivity to changes. Will plan to recheck INR in 2 weeks.

## 2018-12-08 NOTE — Assessment & Plan Note (Signed)
New onset over past 3 months, improved at this time but is not eating steak.  Continue Protonix and discussed monitoring symptoms + ensuring to eat earlier to provider more time before going to sleep.  Schedule to see GI in December.

## 2018-12-08 NOTE — Patient Instructions (Signed)
Bleeding Precautions When on Anticoagulant Therapy, Adult °Anticoagulant therapy, also called blood thinner therapy, is medicine that helps to prevent and treat blood clots. The medicine works by stopping blood clots from forming or growing. Blood clots that form in your blood vessels can be dangerous. They can break loose and travel to the heart, lungs, or brain. This increases the risk of a heart attack, stroke, or blocked lung artery (pulmonary embolism). °Anticoagulants also increase the risk of bleeding. Try to protect yourself from cuts and other injuries that can cause bleeding. It is important to take anticoagulants exactly as told by your health care provider. °Why do I need to be on anticoagulant therapy? °You may need this medicine if you are at risk of developing a blood clot. Conditions that increase your risk of a blood clot include: °· Being born with heart disease or a heart malformation (congenital heart disease). °· Developing heart disease. °· Having had surgery, such as valve replacement. °· Having had a serious accident or other type of severe injury (trauma). °· Having certain types of cancer. °· Having certain diseases that can increase blood clotting. °· Having a high risk of stroke or heart attack. °· Having atrial fibrillation (AF). °What are the common anticoagulant medicines? °There are several types of anticoagulant medicines. The most common types are: °· Medicines that you take by mouth (oral medicines), such as: °? Warfarin. °? Novel oral anticoagulants (NOACs), such as: °? Direct thrombin inhibitors (dabigatran). °? Factor Xa inhibitors (apixaban, edoxaban, and rivaroxaban). °· Injections, such as: °? Unfractionated heparin. °? Low molecular weight heparin. °These anticoagulants work in different ways to prevent blood clots. They also have different risks and side effects. °What do I need to remember while on anticoagulant therapy? °Taking anticoagulants °· Take your medicine at the  same time every day. If you forget to take your medicine, take it as soon as you remember. Do not double your dosage of medicine if you miss a whole day. Take your normal dose and call your health care provider. °· Do not stop taking your medicine unless your health care provider approves. Stopping the medicine can increase your risk of developing a blood clot. °Taking other medicines °· Take over-the-counter and prescriptions medicines only as told by your health care provider. °· Do not take over-the-counter NSAIDs, including aspirin and ibuprofen, while you are on anticoagulant therapy. These medicines increase your risk of dangerous bleeding. °· Get approval from your health care provider before you start taking any new medicines, vitamins, or herbal products. Some of these could interfere with your therapy. °General instructions °· Keep all follow-up visits as told by your health care provider. This is important. °· If you are pregnant or trying to get pregnant, talk with a health care provider about anticoagulants. Some of these medicines are not safe to take during pregnancy. °· Tell all health care providers, including your dentist, that you are on anticoagulant therapy. It is especially important to tell providers before you have any surgery, medical procedures, or dental work done. °What precautions should I take? ° °· Be very careful when using knives, scissors, or other sharp objects. °· Use an electric razor instead of a blade. °· Do not use toothpicks. °· Use a soft-bristled toothbrush. Brush your teeth gently. °· Always wear shoes outdoors and wear slippers indoors. °· Be careful when cutting your fingernails and toenails. °· Place bath mats in the bathroom. If possible, install handrails as well. °· Wear gloves while you do   yard work. °· Wear your seat belt. °· Prevent falls by removing loose rugs and extension cords from areas where you walk. Use a cane or walker if you need it. °· Avoid  constipation by: °? Drinking enough fluid to keep your urine clear or pale yellow. °? Eating foods that are high in fiber, such as fresh fruits and vegetables, whole grains, and beans. °? Limiting foods that are high in fat and processed sugars, such as fried and sweet foods. °· Do not play contact sports or participate in other activities that have a high risk for injury. °What other precautions are important if on warfarin therapy? °If you are taking a type of anticoagulant called warfarin, make sure you: °· Work with a diet and nutrition specialist (dietitian) to make an eating plan. Do not make any sudden changes to your diet after you have started your eating plan. °· Do not drink alcohol. It can interfere with your medicine and increase your risk of an injury that causes bleeding. °· Get regular blood tests as told by your health care provider. °What are some questions to ask my health care provider? °· Why do I need anticoagulant therapy? °· What is the best anticoagulant therapy for my condition? °· How long will I need anticoagulant therapy? °· What are the side effects of anticoagulant therapy? °· When should I take my medicine? What should I do if I forget to take it? °· Will I need to have regular blood tests? °· Do I need to change my diet? Are there foods or drinks that I should avoid? °· What activities are safe for me? °· What should I do if I want to get pregnant? °Contact a health care provider if: °· You miss a dose of medicine: °? And you are not sure what to do. °? For more than one day. °· You have: °? Menstrual bleeding that is heavier than normal. °? Bloody or brown urine. °? Easy bruising. °? Black and tarry stool or bright red stool. °? Side effects from your medicine. °· You feel weak or dizzy. °· You become pregnant. °Get help right away if: °· You have bleeding that will not stop within 20 minutes from: °? The nose. °? The gums. °? A cut on the skin. °· You have a severe headache or  stomachache. °· You vomit or cough up blood. °· You fall or hit your head. °Summary °· Anticoagulant therapy, also called blood thinner therapy, is medicine that helps to prevent and treat blood clots. °· Anticoagulants work in different ways to prevent blood clots. They also have different risks and side effects. °· Talk with your health care provider about any precautions that you should take while on anticoagulant therapy. °This information is not intended to replace advice given to you by your health care provider. Make sure you discuss any questions you have with your health care provider. °Document Released: 12/30/2014 Document Revised: 05/10/2018 Document Reviewed: 04/06/2016 °Elsevier Patient Education © 2020 [employer] ° °

## 2018-12-15 ENCOUNTER — Ambulatory Visit: Payer: 59 | Admitting: Rheumatology

## 2018-12-15 ENCOUNTER — Other Ambulatory Visit: Payer: Self-pay

## 2018-12-15 ENCOUNTER — Encounter: Payer: Self-pay | Admitting: Rheumatology

## 2018-12-15 VITALS — BP 118/63 | HR 54 | Resp 16 | Ht 71.0 in | Wt 197.4 lb

## 2018-12-15 DIAGNOSIS — Z79899 Other long term (current) drug therapy: Secondary | ICD-10-CM | POA: Diagnosis not present

## 2018-12-15 DIAGNOSIS — I10 Essential (primary) hypertension: Secondary | ICD-10-CM

## 2018-12-15 DIAGNOSIS — M170 Bilateral primary osteoarthritis of knee: Secondary | ICD-10-CM | POA: Diagnosis not present

## 2018-12-15 DIAGNOSIS — M3219 Other organ or system involvement in systemic lupus erythematosus: Secondary | ICD-10-CM

## 2018-12-15 DIAGNOSIS — M1120 Other chondrocalcinosis, unspecified site: Secondary | ICD-10-CM

## 2018-12-15 DIAGNOSIS — E782 Mixed hyperlipidemia: Secondary | ICD-10-CM

## 2018-12-15 DIAGNOSIS — Z86718 Personal history of other venous thrombosis and embolism: Secondary | ICD-10-CM

## 2018-12-21 ENCOUNTER — Other Ambulatory Visit: Payer: Self-pay

## 2018-12-22 ENCOUNTER — Ambulatory Visit: Payer: 59 | Admitting: Nurse Practitioner

## 2018-12-22 ENCOUNTER — Encounter: Payer: Self-pay | Admitting: Family Medicine

## 2018-12-22 ENCOUNTER — Other Ambulatory Visit: Payer: Self-pay

## 2018-12-22 ENCOUNTER — Ambulatory Visit: Payer: 59 | Admitting: Family Medicine

## 2018-12-22 VITALS — BP 117/71 | HR 56 | Temp 98.0°F | Ht 71.0 in | Wt 196.0 lb

## 2018-12-22 DIAGNOSIS — I82419 Acute embolism and thrombosis of unspecified femoral vein: Secondary | ICD-10-CM | POA: Diagnosis not present

## 2018-12-22 LAB — COAGUCHEK XS/INR WAIVED
INR: 2.5 — ABNORMAL HIGH (ref 0.9–1.1)
Prothrombin Time: 30.4 s

## 2018-12-22 NOTE — Progress Notes (Signed)
   BP 117/71   Pulse (!) 56   Temp 98 F (36.7 C) (Oral)   Ht 5\' 11"  (1.803 m)   Wt 196 lb (88.9 kg)   SpO2 98%   BMI 27.34 kg/m    Subjective:    Patient ID: Dwayne Jacobson, male    DOB: 1955-04-25, 63 y.o.   MRN: [id number]  HPI: Dwayne Jacobson is a 63 y.o. male  Chief Complaint  Patient presents with  . Coagulation Disorder   Patient here today for 2 week f/u after supratherapeutic INR at 3.3. Dose was maintained and patient has worked on diet alterations to try and return to goal. Current dose is 1 mg 6 days per week and 2 mg one day per week. Denies new medicines, bleeding or bruising issues, CP, palpitations, SOB.   Relevant past medical, surgical, family and social history reviewed and updated as indicated. Interim medical history since our last visit reviewed. Allergies and medications reviewed and updated.  Review of Systems  Per HPI unless specifically indicated above     Objective:    BP 117/71   Pulse (!) 56   Temp 98 F (36.7 C) (Oral)   Ht 5\' 11"  (1.803 m)   Wt 196 lb (88.9 kg)   SpO2 98%   BMI 27.34 kg/m   Wt Readings from Last 3 Encounters:  12/22/18 196 lb (88.9 kg)  12/15/18 197 lb 6.4 oz (89.5 kg)  12/08/18 195 lb (88.5 kg)    Physical Exam Vitals signs and nursing note reviewed.  Constitutional:      Appearance: Normal appearance.  HENT:     Head: Atraumatic.  Eyes:     Extraocular Movements: Extraocular movements intact.     Conjunctiva/sclera: Conjunctivae normal.  Neck:     Musculoskeletal: Normal range of motion and neck supple.  Cardiovascular:     Rate and Rhythm: Normal rate.  Pulmonary:     Effort: Pulmonary effort is normal.     Breath sounds: Normal breath sounds.  Musculoskeletal: Normal range of motion.  Skin:    General: Skin is warm and dry.  Neurological:     General: No focal deficit present.     Mental Status: He is oriented to person, place, and time.  Psychiatric:        Mood and Affect: Mood normal.      Thought Content: Thought content normal.        Judgment: Judgment normal.     Results for orders placed or performed in visit on 12/08/18  CoaguChek XS/INR [name] (STAT)  Result Value Ref Range   INR 3.3 (H) 0.9 - 1.1   Prothrombin Time 40.0 sec      Assessment & Plan:   Problem List Items Addressed This Visit      Cardiovascular and Mediastinum   DVT (deep venous thrombosis) ([place]) - Primary    INR back at goal today at 2.5, continue current regimen and keep diet stable      Relevant Orders   CoaguChek XS/INR [name] (STAT)       Follow up plan: Return in about 4 weeks (around 01/19/2019) for INR.

## 2018-12-22 NOTE — Assessment & Plan Note (Signed)
INR back at goal today at 2.5, continue current regimen and keep diet stable

## 2019-01-02 ENCOUNTER — Ambulatory Visit: Payer: 59 | Admitting: Gastroenterology

## 2019-01-02 ENCOUNTER — Other Ambulatory Visit: Payer: Self-pay

## 2019-01-02 ENCOUNTER — Encounter: Payer: Self-pay | Admitting: Gastroenterology

## 2019-01-02 ENCOUNTER — Telehealth: Payer: Self-pay | Admitting: Nurse Practitioner

## 2019-01-02 VITALS — BP 134/83 | HR 56 | Temp 97.5°F | Ht 71.0 in | Wt 200.2 lb

## 2019-01-02 DIAGNOSIS — M3219 Other organ or system involvement in systemic lupus erythematosus: Secondary | ICD-10-CM

## 2019-01-02 DIAGNOSIS — R1310 Dysphagia, unspecified: Secondary | ICD-10-CM

## 2019-01-02 DIAGNOSIS — I82519 Chronic embolism and thrombosis of unspecified femoral vein: Secondary | ICD-10-CM

## 2019-01-02 DIAGNOSIS — R1319 Other dysphagia: Secondary | ICD-10-CM

## 2019-01-02 NOTE — Telephone Encounter (Signed)
Spoke to patient on telephone about upcoming EGD and Coumadin.  Discussed with him that this may be a good time to switch to newer anticoag, such as Eliquis, for his DVT history and SLE.  He has been on Coumadin since 2001 to 2002, prior to use of these and reports last time they did discuss these they were not being used for DVT.  Educated him on medications and he is very interested, as would like not to have monthly INR checks.  Is concerned about costs.  Discussed with him CCM team in office and will place referral to Catie to further assist.  Would benefit from switch over during time of procedure, as this would give time off Coumadin to get INR < 2 and then could start Eliquis.

## 2019-01-02 NOTE — Progress Notes (Signed)
Cephas Darby, MD [address]  [place]  [place], [place] [address]  Main: [phone]  Fax: [phone]    Gastroenterology Consultation  Referring Provider:     Venita Lick, NP Primary Care Physician:  Venita Lick, NP Primary Gastroenterologist:  Dr. Cephas Darby Reason for Consultation:     Dysphagia        HPI:   Dwayne Jacobson is a 63 y.o. male referred by Dr. Venita Lick, NP  for consultation & management of dysphagia.  Patient reports that for more than 6 months, he has been experiencing difficulty swallowing to solids, particularly to steak.  He had 1 severe episode where the steak piece was stuck for about 5 minutes in his lower chest before it passed spontaneously.  He does report intermittent episodes of difficulty swallowing to solids only.  He does have heartburn for which he was started on Protonix which has alleviated the symptoms.  He has history of 60-pack-year smoking  NSAIDs: None  Antiplts/Anticoagulants/Anti thrombotics: Warfarin for history of DVT  GI Procedures: Colonoscopy in 02/26/2010, normal  Past Medical History:  Diagnosis Date  . DVT (deep venous thrombosis) ([place])   . Hyperlipidemia   . Hypertension   . Seizures ([place])   . Skin cancer   . Systemic lupus erythematosus ([place])     History reviewed. No pertinent surgical history.  Current Outpatient Medications:  .  amLODipine (NORVASC) 5 MG tablet, TAKE 1 TABLET BY MOUTH EVERY DAY, Disp: 90 tablet, Rfl: 0 .  atorvastatin (LIPITOR) 10 MG tablet, TAKE 1 TABLET BY MOUTH EVERY EVENING AT 6:00PM, Disp: 90 tablet, Rfl: 0 .  cyclobenzaprine (FLEXERIL) 10 MG tablet, Take 10 mg by mouth 2 (two) times daily., Disp: , Rfl:  .  hydrochlorothiazide (HYDRODIURIL) 25 MG tablet, TAKE 1 TABLET BY MOUTH EVERY DAY IN THE MORNING, Disp: 90 tablet, Rfl: 0 .  hydroxychloroquine (PLAQUENIL) 200 MG tablet, Take 200 mg by mouth daily., Disp: , Rfl:  .  lisinopril (ZESTRIL) 20 MG tablet, TAKE  1 TABLET BY MOUTH EVERY DAY, Disp: 90 tablet, Rfl: 0 .  olopatadine (PATANOL) 0.1 % ophthalmic solution, olopatadine 0.1 % eye drops, Disp: , Rfl:  .  pantoprazole (PROTONIX) 40 MG tablet, Take 1 tablet (40 mg total) by mouth daily., Disp: 60 tablet, Rfl: 1 .  warfarin (COUMADIN) 2 MG tablet, TAKE 1 TABLET 2 DAYS A WEEK THEN TAKE 1/2 TAB OTHER 5 DAYS OF WEEK OR AS DIRECTED BY COUMADIN CLINIC, Disp: 18 tablet, Rfl: 19   Family History  Problem Relation Age of Onset  . Diabetes Mother   . Hypertension Mother   . Hyperlipidemia Mother   . Stroke Mother   . Lung disease Mother   . Heart disease Mother   . Heart disease Father   . Lung disease Father   . Diabetes Sister   . Colon polyps Maternal Grandmother      Social History   Tobacco Use  . Smoking status: Former Smoker    Packs/day: 1.50    Years: 40.00    Pack years: 60.00    Types: Cigarettes    Quit date: 08/30/2013    Years since quitting: 5.3  . Smokeless tobacco: Former Systems developer    Types: Chew  Substance Use Topics  . Alcohol use: Yes    Alcohol/week: 8.0 - 10.0 standard drinks    Types: 8 - 10 Cans of beer per week  . Drug use: Never  Allergies as of 01/02/2019  . (No Known Allergies)    Review of Systems:    All systems reviewed and negative except where noted in HPI.   Physical Exam:  BP 134/83 (BP Location: Left Arm, Patient Position: Sitting, Cuff Size: Normal)   Pulse (!) 56   Temp (!) 97.5 F (36.4 C) (Oral)   Ht 5\' 11"  (1.803 m)   Wt 200 lb 4 oz (90.8 kg)   BMI 27.93 kg/m  No LMP for male patient.  General:   Alert,  Well-developed, well-nourished, pleasant and cooperative in NAD Head:  Normocephalic and atraumatic. Eyes:  Sclera clear, no icterus.   Conjunctiva pink. Ears:  Normal auditory acuity. Nose:  No deformity, discharge, or lesions. Mouth:  No deformity or lesions,oropharynx pink & moist. Neck:  Supple; no masses or thyromegaly. Lungs:  Respirations even and unlabored.  Clear  throughout to auscultation.   No wheezes, crackles, or rhonchi. No acute distress. Heart:  Regular rate and rhythm; no murmurs, clicks, rubs, or gallops. Abdomen:  Normal bowel sounds. Soft, non-tender and non-distended without masses, hepatosplenomegaly or hernias noted.  No guarding or rebound tenderness.   Rectal: Not performed Msk:  Symmetrical without gross deformities. Good, equal movement & strength bilaterally. Pulses:  Normal pulses noted. Extremities:  No clubbing or edema.  No cyanosis. Neurologic:  Alert and oriented x3;  grossly normal neurologically. Skin:  Intact without significant lesions or rashes. No jaundice. Psych:  Alert and cooperative. Normal mood and affect.  Imaging Studies: No abdominal imaging  Assessment and Plan:   Dwayne Jacobson is a 63 y.o. 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  Given his age, and dysphagia to solids, recommend EGD for further evaluation Needs to hold warfarin for about 5 to 7 days prior to the endoscopy  I have discussed alternative options, risks & benefits,  which include, but are not limited to, bleeding, infection, perforation,respiratory complication & drug reaction.  The patient agrees with this plan & written consent will be obtained.     Follow up in 2 months   Cephas Darby, MD

## 2019-01-03 ENCOUNTER — Ambulatory Visit: Payer: Self-pay | Admitting: Pharmacist

## 2019-01-03 ENCOUNTER — Telehealth: Payer: Self-pay

## 2019-01-03 DIAGNOSIS — I82519 Chronic embolism and thrombosis of unspecified femoral vein: Secondary | ICD-10-CM

## 2019-01-03 DIAGNOSIS — M3219 Other organ or system involvement in systemic lupus erythematosus: Secondary | ICD-10-CM

## 2019-01-03 NOTE — Patient Instructions (Signed)
Visit Information  Goals Addressed            This Visit's Progress     Patient Stated   . PharmD "I don't like taking warfarin" (pt-stated)       Current Barriers:  . Polypharmacy; complex patient with multiple comorbidities including SLE, hx DVT, HTN, HLD, OA . Upcoming EGD will require patient holding warfarin prior to the procedure. Discussing if transitioning to [place] therapy during this time would be an appropriate option.  o SLE: hydroxychloroquine 200 mg QAM. Follows w/ Dr. Estanislado Pandy. Patient denies evey o Hx DVT: warfarin w/ INR goal 2-3  o HTN: amlodipine 5 mg daily, HCTZ 25 mg daily, lisinopril 20 mg daily o ASCVD risk reduction: atorvastatin 10 mg daily   Pharmacist Clinical Goal(s):  Marland Kitchen Over the next 90 days, patient will work with PharmD and provider towards optimized medication management  Interventions: . Comprehensive medication review performed; medication list updated in electronic medical record . Reviewed indication. Patient has SLE, but uncertain if antiphospholipid syndrome. Collaborated w/ Dr. Patrecia Pour. She noted that anticardiolipin antibodies and beta-2 Gp1 were both negative, but she does not see a test for lupus anticoagulant (could have been tested in pre-Epic records). She noted we could consider checking lupus anticoagulant test or referral to hematology . Patient concerned about Eliquis cost. Reviewed income; he does not qualify for Medicare Extra Help or BMS assistance due to being over the income limit. He will contact [employer] and to determine what his copay would be for the medication, and will decide if this is something he can afford.  . Will collaborate w/ Marnee Guarneri for next steps moving forward  Patient Self Care Activities:  . Patient will take medications as prescribed  Initial goal documentation        The patient verbalized understanding of instructions provided today and declined a print copy of patient instruction materials.    Plan: - Will await financial information from patient and collaborate w/ Marnee Guarneri, NP on next steps  Catie Darnelle Maffucci, PharmD, [place] [phone]

## 2019-01-03 NOTE — Telephone Encounter (Signed)
Patient verbalized understanding about the blood thinner request. Patient will do this

## 2019-01-03 NOTE — Telephone Encounter (Signed)
-----   Message from Lin Landsman, MD sent at 01/02/2019  7:18 PM EST ----- Regarding: RE: [facility] patient Thank you, sounds like a plan  RV ----- Message ----- From: Venita Lick, NP Sent: 01/02/2019   4:45 PM EST To: Lin Landsman, MD Subject: RE: [facility] patient                             I talked to his rheumatologist too and Dr. Wynetta Emery here in the office.  He has SLE and has history of DVT in 2005.  He is one patient I had been planning possible switch to long acting on, this may push [place] that way if he can afford or insurance covers.  I am having nurse send over hold orders to your office for him.  We all agreed 3 days and then a restart in 24 hours due to DVT risk with his SLE.   ----- Message ----- From: Lin Landsman, MD Sent: 01/02/2019   1:35 PM EST To: Venita Lick, NP Subject: [facility] patient                                 Hi Jolene  I have seen Mr. Olm today for dysphagia.  He said he had DVT for more than 18 years and he is still on warfarin.  Is there an indication for chronic anticoagulation in his case?  Not sure if he was evaluated by hematology in the pastThanksRohini Vanga

## 2019-01-03 NOTE — Chronic Care Management (AMB) (Signed)
**Note Dwayne-Identified via Obfuscation** Chronic Care Management   Note  01/03/2019 Name: Dwayne Jacobson MRN: [id number] DOB: 1955/12/31   Subjective:  Dwayne Jacobson is a 63 y.o. year old male who is a primary care patient of Cannady, Dwayne Faster, NP. The CCM team was consulted for assistance with chronic disease management and care coordination needs.    Contacted patient for medication management support.    Dwayne Jacobson was given information about Chronic Care Management services today including:  1. CCM service includes personalized support from designated clinical staff supervised by his physician, including individualized plan of care and coordination with other care providers 2. 24/7 contact phone numbers for assistance for urgent and routine care needs. 3. Service will only be billed when office clinical staff spend 20 minutes or more in a month to coordinate care. 4. Only one practitioner may furnish and bill the service in a calendar month. 5. The patient may stop CCM services at any time (effective at the end of the month) by phone call to the office staff. 6. The patient will be responsible for cost sharing (co-pay) of up to 20% of the service fee (after annual deductible is met).  Patient agreed to services and verbal consent obtained.   Review of patient status, including review of consultants reports, laboratory and other test data, was performed as part of comprehensive evaluation and provision of chronic care management services.   Objective:  Lab Results  Component Value Date   CREATININE 0.87 11/07/2018   CREATININE 0.93 07/14/2018   CREATININE 0.78 09/30/2017      Component Value Date/Time   CHOL 161 11/07/2018 0854   TRIG 113 11/07/2018 0854   HDL 44 09/30/2017 1542   VLDL 23 11/07/2018 0854   LDLCALC 84 09/30/2017 1542    Clinical ASCVD: No  The 10-year ASCVD risk score Dwayne Jacobson., et al., 2013) is: 12.1%   Values used to calculate the score:     Age: 32 years     Sex: Male     Is  Non-Hispanic African American: No     Diabetic: No     Tobacco smoker: No     Systolic Blood Pressure: 409 mmHg     Is BP treated: Yes     HDL Cholesterol: 47 mg/dL     Total Cholesterol: 161 mg/dL    BP Readings from Last 3 Encounters:  01/02/19 134/83  12/22/18 117/71  12/15/18 118/63    No Known Allergies  Medications Reviewed Today    Reviewed by Dwayne Jacobson (Pharmacist) on 01/03/19 at Dwayne Jacobson List Status: <None>  Medication Order Taking? Sig Documenting Provider Last Dose Status Informant  amLODipine (NORVASC) 5 MG tablet [id number] Yes TAKE 1 TABLET BY MOUTH EVERY DAY Cannady, Dwayne T, NP Taking Active   atorvastatin (LIPITOR) 10 MG tablet [id number] Yes TAKE 1 TABLET BY MOUTH EVERY EVENING AT 6:00PM Cannady, Dwayne T, NP Taking Active   cyclobenzaprine (FLEXERIL) 10 MG tablet [id number] Yes Take 10 mg by mouth 2 (two) times daily. [provider] Taking Active   hydrochlorothiazide (HYDRODIURIL) 25 MG tablet [id number] Yes TAKE 1 TABLET BY MOUTH EVERY DAY IN THE MORNING Cannady, Dwayne T, NP Taking Active   hydroxychloroquine (PLAQUENIL) 200 MG tablet [id number] Yes Take 200 mg by mouth daily. [provider] Taking Active            Med Note Dwayne Jacobson, Arville Lime   Wed Jan 03, 2019  4:20 PM)    lisinopril (  ZESTRIL) 20 MG tablet [id number] Yes TAKE 1 TABLET BY MOUTH EVERY DAY Cannady, Dwayne T, NP Taking Active   olopatadine (PATANOL) 0.1 % ophthalmic solution [id number] Yes olopatadine 0.1 % eye drops [provider] Taking Active   pantoprazole (PROTONIX) 40 MG tablet [id number] Yes Take 1 tablet (40 mg total) by mouth daily. Dwayne Jacobson T, NP Taking Active   warfarin (COUMADIN) 2 MG tablet [id number] Yes TAKE 1 TABLET 2 DAYS A WEEK THEN TAKE 1/2 TAB OTHER 5 DAYS OF WEEK OR AS DIRECTED BY COUMADIN CLINIC Cannady, Dwayne Faster, NP Taking Active            Assessment:   Goals Addressed            This Visit's Progress     Patient  Stated   . PharmD "I don'Jacobson like taking warfarin" (pt-stated)       Current Barriers:  . Polypharmacy; complex patient with multiple comorbidities including SLE, hx DVT, HTN, HLD, OA . Upcoming EGD will require patient holding warfarin prior to the procedure. Discussing if transitioning to [place] therapy during this time would be an appropriate option.  o SLE: hydroxychloroquine 200 mg QAM. Follows w/ Dr. Estanislado Pandy. Patient denies evey o Hx DVT: warfarin w/ INR goal 2-3  o HTN: amlodipine 5 mg daily, HCTZ 25 mg daily, lisinopril 20 mg daily o ASCVD risk reduction: atorvastatin 10 mg daily   Pharmacist Clinical Goal(s):  Marland Kitchen Over the next 90 days, patient will work with PharmD and provider towards optimized medication management  Interventions: . Comprehensive medication review performed; medication list updated in electronic medical record . Reviewed indication. Patient has SLE, but uncertain if antiphospholipid syndrome. Collaborated w/ Dr. Patrecia Pour. She noted that anticardiolipin antibodies and beta-2 Gp1 were both negative, but she does not see a test for lupus anticoagulant (could have been tested in pre-Epic records). She noted we could consider checking lupus anticoagulant test or referral to hematology . Patient concerned about Eliquis cost. Reviewed income; he does not qualify for Medicare Extra Help or BMS assistance due to being over the income limit. He will contact [employer] and to determine what his copay would be for the medication, and will decide if this is something he can afford.  . Will collaborate w/ Dwayne Jacobson for next steps moving forward  Patient Self Care Activities:  . Patient will take medications as prescribed  Initial goal documentation        Plan: - Will await financial information from patient and collaborate w/ Dwayne Guarneri, NP on next steps  Catie Dwayne Jacobson, PharmD, [place] [phone]

## 2019-01-03 NOTE — Telephone Encounter (Signed)
Per PCP patient is to stop the Warfarin 3 days before procedure then restart it 24 hours after the procedure.

## 2019-01-10 ENCOUNTER — Ambulatory Visit: Payer: Self-pay | Admitting: Pharmacist

## 2019-01-10 DIAGNOSIS — I82519 Chronic embolism and thrombosis of unspecified femoral vein: Secondary | ICD-10-CM

## 2019-01-10 DIAGNOSIS — M3219 Other organ or system involvement in systemic lupus erythematosus: Secondary | ICD-10-CM

## 2019-01-10 NOTE — Patient Instructions (Signed)
Visit Information  Goals Addressed            This Visit's Progress     Patient Stated   . PharmD "I don't like taking warfarin" (pt-stated)       Current Barriers:  . Polypharmacy; complex patient with multiple comorbidities including SLE, hx DVT, HTN, HLD, OA . Upcoming EGD will require patient holding warfarin prior to the procedure. Discussing if transitioning to [place] therapy during this time would be an appropriate option.  o SLE: hydroxychloroquine 200 mg QAM. Follows w/ Dr. Estanislado Pandy; previously followed by Dr. Jefm Bryant; below lab results collected upon his initial lab work in 07/2013 (DVT occurred ~ 10 years prior, per that note)  o Hx DVT: warfarin w/ INR goal 2-3  o HTN: amlodipine 5 mg daily, HCTZ 25 mg daily, lisinopril 20 mg daily o ASCVD risk reduction: atorvastatin 10 mg daily   Pharmacist Clinical Goal(s):  Marland Kitchen Over the next 90 days, patient will work with PharmD and provider towards optimized medication management  Interventions: . Received voicemail from patient. He notes that the copay for Eliquis would be affordable for him.  . Collaborated w/ Marnee Guarneri, NP to send peer to peer questions to hematology to discuss risk vs benefit of switching to [place] therapy, given his clinical hx. Will await thoughts from these clinicians.  . Contacted patient to provide update. Was unable to leave voicemail  Patient Self Care Activities:  . Patient will take medications as prescribed  Please see past updates related to this goal by clicking on the "Past Updates" button in the selected goal         The patient verbalized understanding of instructions provided today and declined a print copy of patient instruction materials.     Plan: - Will continue to collaborate w/ multidisciplinary team as above  Catie Darnelle Maffucci, PharmD, [place] [phone]

## 2019-01-10 NOTE — Chronic Care Management (AMB) (Signed)
Chronic Care Management   Follow Up Note   01/10/2019 Name: Dwayne Jacobson MRN: [id number] DOB: Jan 02, 1956  Referred by: Venita Lick, NP Reason for referral : Chronic Care Management (Medication Management)   Dwayne Jacobson is a 63 y.o. year old male who is a primary care patient of Cannady, Barbaraann Faster, NP. The CCM team was consulted for assistance with chronic disease management and care coordination needs.    Care coordination completed today.   Review of patient status, including review of consultants reports, relevant laboratory and other test results, and collaboration with appropriate care team members and the patient's provider was performed as part of comprehensive patient evaluation and provision of chronic care management services.    SDOH (Social Determinants of Health) screening performed today: None. See Care Plan for related entries.   Outpatient Encounter Medications as of 01/10/2019  Medication Sig  . amLODipine (NORVASC) 5 MG tablet TAKE 1 TABLET BY MOUTH EVERY DAY  . atorvastatin (LIPITOR) 10 MG tablet TAKE 1 TABLET BY MOUTH EVERY EVENING AT 6:00PM  . cyclobenzaprine (FLEXERIL) 10 MG tablet Take 10 mg by mouth 2 (two) times daily.  . hydrochlorothiazide (HYDRODIURIL) 25 MG tablet TAKE 1 TABLET BY MOUTH EVERY DAY IN THE MORNING  . hydroxychloroquine (PLAQUENIL) 200 MG tablet Take 200 mg by mouth daily.  Marland Kitchen lisinopril (ZESTRIL) 20 MG tablet TAKE 1 TABLET BY MOUTH EVERY DAY  . Multiple Vitamin (MULTIVITAMIN) tablet Take 1 tablet by mouth daily.  Marland Kitchen olopatadine (PATANOL) 0.1 % ophthalmic solution olopatadine 0.1 % eye drops  . pantoprazole (PROTONIX) 40 MG tablet Take 1 tablet (40 mg total) by mouth daily.  Marland Kitchen warfarin (COUMADIN) 2 MG tablet TAKE 1 TABLET 2 DAYS A WEEK THEN TAKE 1/2 TAB OTHER 5 DAYS OF WEEK OR AS DIRECTED BY COUMADIN CLINIC   No facility-administered encounter medications on file as of 01/10/2019.      Goals Addressed            This Visit's  Progress     Patient Stated   . PharmD "I don't like taking warfarin" (pt-stated)       Current Barriers:  . Polypharmacy; complex patient with multiple comorbidities including SLE, hx DVT, HTN, HLD, OA . Upcoming EGD will require patient holding warfarin prior to the procedure. Discussing if transitioning to [place] therapy during this time would be an appropriate option.  o SLE: hydroxychloroquine 200 mg QAM. Follows w/ Dr. Estanislado Pandy; previously followed by Dr. Jefm Bryant; below lab results collected upon his initial lab work in 07/2013 (DVT occurred ~ 10 years prior, per that note)  o Hx DVT: warfarin w/ INR goal 2-3  o HTN: amlodipine 5 mg daily, HCTZ 25 mg daily, lisinopril 20 mg daily o ASCVD risk reduction: atorvastatin 10 mg daily   Pharmacist Clinical Goal(s):  Marland Kitchen Over the next 90 days, patient will work with PharmD and provider towards optimized medication management  Interventions: . Received voicemail from patient. He notes that the copay for Eliquis would be affordable for him.  . Collaborated w/ Marnee Guarneri, NP to send peer to peer questions to hematology to discuss risk vs benefit of switching to [place] therapy, given his clinical hx. Will await thoughts from these clinicians.  . Contacted patient to provide update. Was unable to leave voicemail  Patient Self Care Activities:  . Patient will take medications as prescribed  Please see past updates related to this goal by clicking on the "Past Updates" button in the selected goal  Plan: - Will continue to collaborate w/ multidisciplinary team as above  Catie Darnelle Maffucci, PharmD, [place] [phone]

## 2019-01-11 ENCOUNTER — Other Ambulatory Visit: Payer: Self-pay | Admitting: Nurse Practitioner

## 2019-01-11 DIAGNOSIS — M329 Systemic lupus erythematosus, unspecified: Secondary | ICD-10-CM

## 2019-01-11 NOTE — Progress Notes (Signed)
Referral to Dr. Janese Banks to discuss further SLE and if patient candidate to switch over to DOAC vs Coumadin.

## 2019-01-16 ENCOUNTER — Other Ambulatory Visit
Admission: RE | Admit: 2019-01-16 | Discharge: 2019-01-16 | Disposition: A | Discharge status: Home / Self Care | Payer: 59 | Source: Ambulatory Visit | Attending: Gastroenterology | Admitting: Gastroenterology

## 2019-01-16 DIAGNOSIS — Z20828 Contact with and (suspected) exposure to other viral communicable diseases: Secondary | ICD-10-CM | POA: Diagnosis not present

## 2019-01-16 DIAGNOSIS — Z01812 Encounter for preprocedural laboratory examination: Secondary | ICD-10-CM | POA: Diagnosis not present

## 2019-01-16 LAB — SARS CORONAVIRUS 2 (TAT 6-24 HRS): SARS Coronavirus 2: NEGATIVE

## 2019-01-17 ENCOUNTER — Other Ambulatory Visit: Payer: Self-pay | Admitting: Nurse Practitioner

## 2019-01-17 ENCOUNTER — Telehealth: Payer: Self-pay | Admitting: Gastroenterology

## 2019-01-17 NOTE — Telephone Encounter (Signed)
Procedure has been cancelled due to cost of procedure, pt called to cancel

## 2019-01-17 NOTE — Telephone Encounter (Signed)
Pt spouse called to cancel pt procedure due to the cost of $5000. Please call to confirm this has been canceled.

## 2019-01-18 ENCOUNTER — Telehealth: Payer: Self-pay | Admitting: Nurse Practitioner

## 2019-01-18 ENCOUNTER — Other Ambulatory Visit: Payer: Self-pay | Admitting: Nurse Practitioner

## 2019-01-18 NOTE — Telephone Encounter (Signed)
Copied from [place] [id number]. Topic: General - Other >> Jan 17, 2019  5:00 PM Mcneil, Ja-Kwan wrote: Reason for CRM: Pt wife stated they have not heard from the specialist in regards to the coumadin. Pt wife stated pt currently is not taking his coumadin so they would like to know what is going on.

## 2019-01-18 NOTE — Telephone Encounter (Signed)
Message sent to referral team to check on referral as patient needs to be seen ASAP.

## 2019-01-18 NOTE — Telephone Encounter (Signed)
Forwarding medication refill requests to PCP for review. 

## 2019-01-18 NOTE — Telephone Encounter (Signed)
Patient is already scheduled with Jolene on 02/09/19.

## 2019-01-19 ENCOUNTER — Encounter: Admission: RE | Payer: Self-pay | Source: Home / Self Care

## 2019-01-19 ENCOUNTER — Ambulatory Visit: Admission: RE | Admit: 2019-01-19 | Payer: 59 | Source: Home / Self Care | Admitting: Gastroenterology

## 2019-01-19 SURGERY — ESOPHAGOGASTRODUODENOSCOPY (EGD) WITH PROPOFOL
Anesthesia: General

## 2019-01-29 ENCOUNTER — Other Ambulatory Visit: Payer: Self-pay

## 2019-01-30 ENCOUNTER — Other Ambulatory Visit: Payer: Self-pay

## 2019-01-30 ENCOUNTER — Encounter: Payer: Self-pay | Admitting: Oncology

## 2019-01-30 ENCOUNTER — Inpatient Hospital Stay: Payer: 59 | Attending: Oncology | Admitting: Oncology

## 2019-01-30 VITALS — BP 130/75 | HR 57 | Temp 97.2°F | Ht 71.0 in | Wt 199.0 lb

## 2019-01-30 DIAGNOSIS — M3219 Other organ or system involvement in systemic lupus erythematosus: Secondary | ICD-10-CM

## 2019-01-30 DIAGNOSIS — I82502 Chronic embolism and thrombosis of unspecified deep veins of left lower extremity: Secondary | ICD-10-CM

## 2019-01-30 DIAGNOSIS — D6861 Antiphospholipid syndrome: Secondary | ICD-10-CM | POA: Insufficient documentation

## 2019-01-30 DIAGNOSIS — Z86718 Personal history of other venous thrombosis and embolism: Secondary | ICD-10-CM | POA: Diagnosis not present

## 2019-01-30 DIAGNOSIS — Z7901 Long term (current) use of anticoagulants: Secondary | ICD-10-CM | POA: Insufficient documentation

## 2019-01-30 NOTE — Progress Notes (Signed)
Patient is here today to establish care for systemic lupus erythematosus.

## 2019-02-04 ENCOUNTER — Other Ambulatory Visit: Payer: Self-pay | Admitting: Nurse Practitioner

## 2019-02-04 ENCOUNTER — Encounter: Payer: Self-pay | Admitting: Oncology

## 2019-02-04 NOTE — Progress Notes (Signed)
Hematology/Oncology Consult note [facility] [phone] [phone]  Patient Care Team: Venita Lick, NP as PCP - General (Nurse Practitioner) De Hollingshead, [facility] as Pharmacist (Pharmacist)   Name of the patient: Dwayne Jacobson  [id number]  03/26/1955    Reason for referral- anticoagulation recommendations   Referring physician- Marnee Guarneri NP  Date of visit: 02/04/19   History of presenting illness-patient is a 64 year old male who was diagnosed with left lower extremity DVT PE back in 2001.  Patient does not remember if this was a proximal or distal lower leg DVT I do not have prior Doppler results back from 2001.  He has been on Coumadin since then.  Patient would like to switch over from Coumadin to NOAC.  There was a concern if patient has antiphospholipid antibody syndrome and if it would be safe for patient to switch from Coumadin to NOAC and he has therefore been referred to [place]  Patient reports tolerating Coumadin well without any significant side effects.  No family history of DVT.  Patient has not had any recurrent thromboembolic events on Coumadin.  ECOG PS- 1  Pain scale- 0   Review of systems- Review of Systems  Constitutional: Negative for chills, fever, malaise/fatigue and weight loss.  HENT: Negative for congestion, ear discharge and nosebleeds.   Eyes: Negative for blurred vision.  Respiratory: Negative for cough, hemoptysis, sputum production, shortness of breath and wheezing.   Cardiovascular: Negative for chest pain, palpitations, orthopnea and claudication.  Gastrointestinal: Negative for abdominal pain, blood in stool, constipation, diarrhea, heartburn, melena, nausea and vomiting.  Genitourinary: Negative for dysuria, flank pain, frequency, hematuria and urgency.  Musculoskeletal: Negative for back pain, joint pain and myalgias.  Skin: Negative for rash.  Neurological: Negative for dizziness, tingling,  focal weakness, seizures, weakness and headaches.  Endo/Heme/Allergies: Does not bruise/bleed easily.  Psychiatric/Behavioral: Negative for depression and suicidal ideas. The patient does not have insomnia.     No Known Allergies  Patient Active Problem List   Diagnosis Date Noted  . Dysphagia 11/07/2018  . Traumatic rupture of biceps tendon 07/25/2018  . Encounter for monitoring Coumadin therapy 04/01/2017  . Chondrocalcinosis 01/13/2017  . Primary osteoarthritis of both knees 01/13/2017  . DVT (deep venous thrombosis) ([place])   . Hypertension   . Hyperlipidemia   . SLE (systemic lupus erythematosus) ([place]) 01/18/2014     Past Medical History:  Diagnosis Date  . DVT (deep venous thrombosis) ([place])   . Hyperlipidemia   . Hypertension   . Seizures ([place])   . Skin cancer   . Systemic lupus erythematosus ([place])      Past Surgical History:  Procedure Laterality Date  . NO PAST SURGERIES      Social History   Socioeconomic History  . Marital status: Married    Spouse name: Not on file  . Number of children: Not on file  . Years of education: Not on file  . Highest education level: Not on file  Occupational History  . Not on file  Tobacco Use  . Smoking status: Former Smoker    Packs/day: 1.50    Years: 40.00    Pack years: 60.00    Types: Cigarettes    Quit date: 08/30/2013    Years since quitting: 5.4  . Smokeless tobacco: Former Systems developer    Types: Chew  Substance and Sexual Activity  . Alcohol use: Yes    Alcohol/week: 8.0 - 10.0 standard drinks    Types: 8 -  10 Cans of beer per week  . Drug use: Never  . Sexual activity: Yes  Other Topics Concern  . Not on file  Social History Narrative  . Not on file   Social Determinants of Health   Financial Resource Strain:   . Difficulty of Paying Living Expenses: Not on file  Food Insecurity:   . Worried About Charity fundraiser in the Last Year: Not on file  . Ran Out of Food in the Last Year: Not on file    Transportation Needs:   . Lack of Transportation (Medical): Not on file  . Lack of Transportation (Non-Medical): Not on file  Physical Activity:   . Days of Exercise per Week: Not on file  . Minutes of Exercise per Session: Not on file  Stress:   . Feeling of Stress : Not on file  Social Connections:   . Frequency of Communication with Friends and Family: Not on file  . Frequency of Social Gatherings with Friends and Family: Not on file  . Attends Religious Services: Not on file  . Active Member of Clubs or Organizations: Not on file  . Attends Archivist Meetings: Not on file  . Marital Status: Not on file  Intimate Partner Violence:   . Fear of Current or Ex-Partner: Not on file  . Emotionally Abused: Not on file  . Physically Abused: Not on file  . Sexually Abused: Not on file     Family History  Problem Relation Age of Onset  . Diabetes Mother   . Hypertension Mother   . Hyperlipidemia Mother   . Stroke Mother   . Lung disease Mother   . Heart disease Mother   . Heart disease Father   . Lung disease Father   . Diabetes Sister   . Colon polyps Maternal Grandmother      Current Outpatient Medications:  .  amLODipine (NORVASC) 5 MG tablet, TAKE 1 TABLET BY MOUTH EVERY DAY, Disp: 90 tablet, Rfl: 0 .  atorvastatin (LIPITOR) 10 MG tablet, TAKE 1 TABLET BY MOUTH EVERY EVENING AT 6:00PM, Disp: 90 tablet, Rfl: 3 .  hydrochlorothiazide (HYDRODIURIL) 25 MG tablet, TAKE 1 TABLET BY MOUTH EVERY DAY IN THE MORNING, Disp: 90 tablet, Rfl: 3 .  hydroxychloroquine (PLAQUENIL) 200 MG tablet, Take 200 mg by mouth daily., Disp: , Rfl:  .  lisinopril (ZESTRIL) 20 MG tablet, TAKE 1 TABLET BY MOUTH EVERY DAY, Disp: 90 tablet, Rfl: 0 .  Multiple Vitamin (MULTIVITAMIN) tablet, Take 1 tablet by mouth daily., Disp: , Rfl:  .  olopatadine (PATANOL) 0.1 % ophthalmic solution, olopatadine 0.1 % eye drops, Disp: , Rfl:  .  pantoprazole (PROTONIX) 40 MG tablet, Take 1 tablet (40 mg total)  by mouth daily., Disp: 60 tablet, Rfl: 1 .  warfarin (COUMADIN) 2 MG tablet, TAKE 1 TABLET 2 DAYS A WEEK THEN TAKE 1/2 TAB OTHER 5 DAYS OF WEEK OR AS DIRECTED BY COUMADIN CLINIC, Disp: 18 tablet, Rfl: 19 .  cyclobenzaprine (FLEXERIL) 10 MG tablet, Take 10 mg by mouth 3 (three) times daily as needed. , Disp: , Rfl:    Physical exam:  Vitals:   01/30/19 1047  BP: 130/75  Pulse: (!) 57  Temp: (!) 97.2 F (36.2 C)  TempSrc: Tympanic  Weight: 199 lb (90.3 kg)  Height: 5\' 11"  (1.803 m)   Physical Exam Constitutional:      General: He is not in acute distress. HENT:     Head: Normocephalic and atraumatic.  Eyes:     Pupils: Pupils are equal, round, and reactive to light.  Cardiovascular:     Rate and Rhythm: Normal rate and regular rhythm.     Heart sounds: Normal heart sounds.  Pulmonary:     Effort: Pulmonary effort is normal.     Breath sounds: Normal breath sounds.  Abdominal:     General: Bowel sounds are normal.     Palpations: Abdomen is soft.  Musculoskeletal:     Cervical back: Normal range of motion.  Skin:    General: Skin is warm and dry.  Neurological:     Mental Status: He is alert and oriented to person, place, and time.        CMP Latest Ref Rng & Units 11/07/2018  Glucose 65 - 99 mg/dL 97  BUN 8 - 27 mg/dL 14  Creatinine 0.76 - 1.27 mg/dL 0.87  Sodium 134 - 144 mmol/L 133(L)  Potassium 3.5 - 5.2 mmol/L 4.5  Chloride 96 - 106 mmol/L 94(L)  CO2 20 - 29 mmol/L 25  Calcium 8.6 - 10.2 mg/dL 9.8  Total Protein 6.0 - 8.5 g/dL 7.3  Total Bilirubin 0.0 - 1.2 mg/dL 0.2  Alkaline Phos 39 - 117 IU/L 78  AST 0 - 40 IU/L 33  ALT 0 - 44 IU/L 41   CBC Latest Ref Rng & Units 11/07/2018  WBC 3.4 - 10.8 x10E3/uL 3.8  Hemoglobin 13.0 - 17.7 g/dL 13.6  Hematocrit 37.5 - 51.0 % 39.6  Platelets 150 - 450 x10E3/uL 292     Assessment and plan- Patient is a 64 y.o. male with prior history of unprovoked left lower extremity DVT on coumadin referred for anticoagulation  management  It appears that patient's first thromboembolic episode back in [id number] was an unprovoked one.  It is unclear if patient had a proximal or distal lower extremity DVT.  If patient truly had an unprovoked proximal lower extremity DVT, it would be reasonable for him to remain on lifelong anticoagulation and periodic reviewing of bleeding versus clotting risk.   With regards to what anticoagulant can be used-I have reviewed patient's antiphospholipid antibody syndrome labs that have been drawn between 20 15-20 17. Anticardiolipin and beta-2 glycoprotein levels have consistently remained less than 40.  Patient also had a DRV VT test testing in 2015 for lupus anticoagulant.  Although the dilute prothrombin time was elevated to confirm ratio as well as DRV VT was normal and no lupus anticoagulant was detected.  His blood work results are therefore not consistent with antiphospholipid antisyndrome.  Although patient has underlying SLE that does not mean patient has antiphospholipid antibody syndrome as he does not meet [place] criteria.  It would be okay for patient to switch from Coumadin to newer anticoagulants such as eliquis or xarelto. His kidney functions are stable.  Patient can continue to f/u with his pcp for the same. He does not require hematology follow up  Thank you for this kind referral and the opportunity to participate in the care of this patient   Visit Diagnosis 1. Other systemic lupus erythematosus with other organ involvement ([place])   2. Long term current use of anticoagulant therapy   3. Leg DVT (deep venous thromboembolism), chronic, left ([place])     Dr. Randa Evens, MD, MPH [facility] [id number] 02/04/2019

## 2019-02-05 NOTE — Progress Notes (Signed)
Read note below!!  Good news!!

## 2019-02-08 ENCOUNTER — Telehealth: Payer: Self-pay | Admitting: Nurse Practitioner

## 2019-02-08 NOTE — Telephone Encounter (Signed)
Spoke to patient on phone after reviewing hematology note.  He is okay to start eliquis or xarelto.  Is scheduled to see this provider on 02/16/2019.  Will have him hold Coumadin dose two days before visit and goal is to have INR <2 so can transition to NOAC.  Discussed at length with patient.  He is excited to transition.

## 2019-02-09 ENCOUNTER — Ambulatory Visit: Payer: 59 | Admitting: Nurse Practitioner

## 2019-02-09 ENCOUNTER — Telehealth: Payer: Self-pay

## 2019-02-09 NOTE — Telephone Encounter (Signed)
Copied from [place] [id number]. Topic: Quick Communication - Rx Refill/Question >> Feb 09, 2019 11:09 AM Erick Blinks wrote: Pt called stating that Pharmacy needs Authorization call from Clinic for insurance purposes for pantoprozole refill [id number], [place] - [address]  [address] [place], [place] [address]  Phone:  [phone] Fax:  [phone]   PA for Pantoprazole submitted and approved via Cover My Meds. Key:  [id number].   Called and let patient know that this RX was approved.

## 2019-02-16 ENCOUNTER — Other Ambulatory Visit: Payer: Self-pay

## 2019-02-16 ENCOUNTER — Ambulatory Visit: Payer: 59 | Admitting: Nurse Practitioner

## 2019-02-16 ENCOUNTER — Encounter: Payer: Self-pay | Admitting: Nurse Practitioner

## 2019-02-16 VITALS — BP 131/71 | HR 60 | Temp 97.8°F

## 2019-02-16 DIAGNOSIS — M3219 Other organ or system involvement in systemic lupus erythematosus: Secondary | ICD-10-CM

## 2019-02-16 DIAGNOSIS — I82519 Chronic embolism and thrombosis of unspecified femoral vein: Secondary | ICD-10-CM | POA: Diagnosis not present

## 2019-02-16 LAB — COAGUCHEK XS/INR WAIVED
INR: 1.7 — ABNORMAL HIGH (ref 0.9–1.1)
Prothrombin Time: 20.8 s

## 2019-02-16 MED ORDER — APIXABAN 5 MG PO TABS: 5.0000 mg | ORAL_TABLET | Freq: Two times a day (BID) | ORAL | 3 refills | Status: DC

## 2019-02-16 NOTE — Patient Instructions (Signed)
Apixaban oral tablets What is this medicine? APIXABAN (a PIX a ban) is an anticoagulant (blood thinner). It is used to lower the chance of stroke in people with a medical condition called atrial fibrillation. It is also used to treat or prevent blood clots in the lungs or in the veins. This medicine may be used for other purposes; ask your health care provider or pharmacist if you have questions. COMMON BRAND NAME(S): Eliquis What should I tell my health care provider before I take this medicine? They need to know if you have any of these conditions:  antiphospholipid antibody syndrome  bleeding disorders  bleeding in the brain  blood in your stools (black or tarry stools) or if you have blood in your vomit  history of blood clots  history of stomach bleeding  kidney disease  liver disease  mechanical heart valve  an unusual or allergic reaction to apixaban, other medicines, foods, dyes, or preservatives  pregnant or trying to get pregnant  breast-feeding How should I use this medicine? Take this medicine by mouth with a glass of water. Follow the directions on the prescription label. You can take it with or without food. If it upsets your stomach, take it with food. Take your medicine at regular intervals. Do not take it more often than directed. Do not stop taking except on your doctor's advice. Stopping this medicine may increase your risk of a blood clot. Be sure to refill your prescription before you run out of medicine. Talk to your pediatrician regarding the use of this medicine in children. Special care may be needed. Overdosage: If you think you have taken too much of this medicine contact a poison control center or emergency room at once. NOTE: This medicine is only for you. Do not share this medicine with others. What if I miss a dose? If you miss a dose, take it as soon as you can. If it is almost time for your next dose, take only that dose. Do not take double or  extra doses. What may interact with this medicine? This medicine may interact with the following:  aspirin and aspirin-like medicines  certain medicines for fungal infections like ketoconazole and itraconazole  certain medicines for seizures like carbamazepine and phenytoin  certain medicines that treat or prevent blood clots like warfarin, enoxaparin, and dalteparin  clarithromycin  NSAIDs, medicines for pain and inflammation, like ibuprofen or naproxen  rifampin  ritonavir  St. John's wort This list may not describe all possible interactions. Give your health care provider a list of all the medicines, herbs, non-prescription drugs, or dietary supplements you use. Also tell them if you smoke, drink alcohol, or use illegal drugs. Some items may interact with your medicine. What should I watch for while using this medicine? Visit your healthcare professional for regular checks on your progress. You may need blood work done while you are taking this medicine. Your condition will be monitored carefully while you are receiving this medicine. It is important not to miss any appointments. Avoid sports and activities that might cause injury while you are using this medicine. Severe falls or injuries can cause unseen bleeding. Be careful when using sharp tools or knives. Consider using an electric razor. Take special care brushing or flossing your teeth. Report any injuries, bruising, or red spots on the skin to your healthcare professional. If you are going to need surgery or other procedure, tell your healthcare professional that you are taking this medicine. Wear a medical ID bracelet   or chain. Carry a card that describes your disease and details of your medicine and dosage times. What side effects may I notice from receiving this medicine? Side effects that you should report to your doctor or health care professional as soon as possible:  allergic reactions like skin rash, itching or hives,  swelling of the face, lips, or tongue  signs and symptoms of bleeding such as bloody or black, tarry stools; red or dark-brown urine; spitting up blood or brown material that looks like coffee grounds; red spots on the skin; unusual bruising or bleeding from the eye, gums, or nose  signs and symptoms of a blood clot such as chest pain; shortness of breath; pain, swelling, or warmth in the leg  signs and symptoms of a stroke such as changes in vision; confusion; trouble speaking or understanding; severe headaches; sudden numbness or weakness of the face, arm or leg; trouble walking; dizziness; loss of coordination This list may not describe all possible side effects. Call your doctor for medical advice about side effects. You may report side effects to FDA at 1-800-FDA-1088. Where should I keep my medicine? Keep out of the reach of children. Store at room temperature between 20 and 25 degrees C (68 and 77 degrees F). Throw away any unused medicine after the expiration date. NOTE: This sheet is a summary. It may not cover all possible information. If you have questions about this medicine, talk to your doctor, pharmacist, or health care provider.  2020 Elsevier/Gold Standard (2017-09-28 17:39:34)  

## 2019-02-16 NOTE — Progress Notes (Signed)
   BP 131/71 (BP Location: Left Arm, Patient Position: Sitting, Cuff Size: Normal)   Pulse 60   Temp 97.8 F (36.6 C) (Oral)   SpO2 99%    Subjective:    Patient ID: Dwayne Jacobson, male    DOB: November 08, 1955, 64 y.o.   MRN: [id number]  CC: Coumadin management  HPI: This patient is a64 y.o.malewho presents for coumadin management. The expected duration of coumadin treatment islifelongThe reason for anticoagulation is DVT/PE.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 Recently seen by hematology to determine whether patient is appropriate to switch to Eliquis with his SLE.  It was determined by hematology after lab review and discussion with patient that he is appropriate for this change in therapy.  Patient is agreeable to this change, as will improve quality of life with having to focus on diet less and no monthly lab draws.  Present Coumadin dose: 1 mg 6 days per week and 2 mg one day per week Goal: 2.0-3.0 The patient does not have an active anticoagulation episode. Excessive bruising: no Nose bleeding: no Rectal bleeding: no Prolonged menstrual cycles: N/A Eating diet with consistent amounts of foods containing Vitamin K:yes Any recent antibiotic use? no  Relevant past medical, surgical, family and social history reviewed and updated as indicated. Interim medical history since our last visit reviewed. Allergies and medications reviewed and updated.  ROS: Per HPI unless specifically indicated above     Objective:    BP 131/71 (BP Location: Left Arm, Patient Position: Sitting, Cuff Size: Normal)   Pulse 60   Temp 97.8 F (36.6 C) (Oral)   SpO2 99%   Wt Readings from Last 3 Encounters:  01/30/19 199 lb (90.3 kg)  01/02/19 200 lb 4 oz (90.8 kg)  12/22/18 196 lb (88.9 kg)     General: Well appearing, well nourished in no distress.  Normal mood and affect. Skin: No excessive bruising or rash  Last INR:     Last CBC:  Lab Results  Component Value Date   WBC 3.8 11/07/2018   HGB 13.6 11/07/2018   HCT 39.6 11/07/2018   MCV 91 11/07/2018   PLT 292 11/07/2018    Results for orders placed or performed during the hospital encounter of 01/16/19  SARS CORONAVIRUS 2 (TAT 6-24 HRS) Nasopharyngeal Nasopharyngeal Swab   Specimen: Nasopharyngeal Swab  Result Value Ref Range   SARS Coronavirus 2 NEGATIVE NEGATIVE       Assessment:     ICD-10-CM   1. 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 Other systemic lupus erythematosus with other organ involvement ([place])  M32.19     Plan:   Discussed current plan face-to-face with patient. INR today 1.7, is under 2.  Will initiate Eliquis 5 MG BID, patient is excited about this change and what it means for his quality of life.  All questions answered.  He will return in 6 months for annual physical or sooner if any issues with medication change.  IF concerns with cost he will reach out to CCM, Alphonzo Severance PharmD.

## 2019-02-16 NOTE — Assessment & Plan Note (Signed)
Reviewed hematology note, okay for change from Warfarin to Eliquis for this patient.  Will continue collaboration with his rheumatologist Dr. Estanislado Pandy.

## 2019-02-16 NOTE — Assessment & Plan Note (Signed)
INR today 1.7, is under 2.  Will initiate Eliquis 5 MG BID and discontinue Warfarin, patient is excited about this change and what it means for his quality of life.  All questions answered.  He will return in 6 months for annual physical or sooner if any issues with medication change.  If concerns with cost he will reach out to CCM, Alphonzo Severance PharmD.

## 2019-02-28 ENCOUNTER — Encounter: Payer: Self-pay | Admitting: Pharmacist

## 2019-02-28 ENCOUNTER — Ambulatory Visit: Payer: Self-pay | Admitting: Pharmacist

## 2019-02-28 ENCOUNTER — Telehealth: Payer: Self-pay

## 2019-02-28 NOTE — Chronic Care Management (AMB) (Signed)
  Chronic Care Management   Note  02/28/2019 Name: Dwayne Jacobson MRN: [id number] DOB: Feb 11, 1955  Dwayne Jacobson is a 64 y.o. year old male who is a primary care patient of Cannady, Barbaraann Faster, NP. The CCM team was consulted for assistance with chronic disease management and care coordination needs.    Attempted to contact patient for medication management review s/p switch to Eliquis. Unable to leave voicemail on listed phone number. Will send a MyChart message providing my contact information, as well as have my scheduling Care Guide attempt to outreach patient to schedule f/u with me.   Catie Darnelle Maffucci, PharmD, [place] [phone]

## 2019-03-01 ENCOUNTER — Telehealth: Payer: Self-pay | Admitting: Nurse Practitioner

## 2019-03-01 NOTE — Chronic Care Management (AMB) (Signed)
°  Care Management   Note  03/01/2019 Name: [name] MRN: [id number] DOB: December 26, 1955  Dwayne Jacobson is a 64 y.o. year old male who is a primary care patient of Venita Lick, NP and is actively engaged with the care management team. I reached out to Dwayne Jacobson by phone today to assist with re-scheduling a follow up visit with the Pharmacist  Follow up plan: Telephone appointment with care management team member scheduled for:03/23/2019  Noreene Larsson, [place], [place], [place] [address] Direct Dial: [phone] [email or web address] Website: [email or web address]

## 2019-03-13 ENCOUNTER — Ambulatory Visit: Payer: 59 | Admitting: Gastroenterology

## 2019-03-23 ENCOUNTER — Ambulatory Visit: Payer: Self-pay | Admitting: Pharmacist

## 2019-03-23 DIAGNOSIS — M3219 Other organ or system involvement in systemic lupus erythematosus: Secondary | ICD-10-CM

## 2019-03-23 DIAGNOSIS — I82519 Chronic embolism and thrombosis of unspecified femoral vein: Secondary | ICD-10-CM

## 2019-03-23 NOTE — Chronic Care Management (AMB) (Signed)
  Chronic Care Management   Follow Up Note   03/23/2019 Name: JAFARI MORRICAL MRN: [id number] DOB: 31-Jul-1955  Referred by: Venita Lick, NP Reason for referral : Chronic Care Management (Medication Management)   LANGDON SIMINGTON is a 64 y.o. year old male who is a primary care patient of Cannady, Barbaraann Faster, NP. The CCM team was consulted for assistance with chronic disease management and care coordination needs.    Contacted patient for medication management review today.   Review of patient status, including review of consultants reports, relevant laboratory and other test results, and collaboration with appropriate care team members and the patient's provider was performed as part of comprehensive patient evaluation and provision of chronic care management services.    SDOH (Social Determinants of Health) assessments performed: No    Outpatient Encounter Medications as of 03/23/2019  Medication Sig  . apixaban (ELIQUIS) 5 MG TABS tablet Take 1 tablet (5 mg total) by mouth 2 (two) times daily.  Marland Kitchen amLODipine (NORVASC) 5 MG tablet TAKE 1 TABLET BY MOUTH EVERY DAY  . atorvastatin (LIPITOR) 10 MG tablet TAKE 1 TABLET BY MOUTH EVERY EVENING AT 6:00PM  . cyclobenzaprine (FLEXERIL) 10 MG tablet Take 10 mg by mouth 3 (three) times daily as needed.   . hydrochlorothiazide (HYDRODIURIL) 25 MG tablet TAKE 1 TABLET BY MOUTH EVERY DAY IN THE MORNING  . hydroxychloroquine (PLAQUENIL) 200 MG tablet Take 200 mg by mouth daily.  Marland Kitchen lisinopril (ZESTRIL) 20 MG tablet TAKE 1 TABLET BY MOUTH EVERY DAY  . Multiple Vitamin (MULTIVITAMIN) tablet Take 1 tablet by mouth daily.  Marland Kitchen olopatadine (PATANOL) 0.1 % ophthalmic solution olopatadine 0.1 % eye drops  . pantoprazole (PROTONIX) 40 MG tablet TAKE 1 TABLET BY MOUTH EVERY DAY   No facility-administered encounter medications on file as of 03/23/2019.     Objective:   Goals Addressed            This Visit's Progress     Patient Stated   . PharmD "I  don't like taking warfarin" (pt-stated)       Current Barriers:  . Polypharmacy; complex patient with multiple comorbidities including SLE, hx DVT, HTN, HLD, OA . Collaborated w/ rheumatology and hem/onc to determine if patient was an appropriate candidate for DOAC therapy for DVT ppx, rather than warfarin therapy. As antilupus anticoagulant negative, appropriate to switch. Transitioned to Eliquis therapy.  . Notes today that everything is "going great" with Eliquis therapy. Denies any side effects or cost concerns with the medication  o SLE: hydroxychloroquine 200 mg QAM. Follows w/ Dr. Estanislado Pandy; previously followed by Dr. Jefm Bryant;  o Hx DVT: Eliquis 5 mg BID  o HTN: amlodipine 5 mg daily, HCTZ 25 mg daily, lisinopril 20 mg daily o ASCVD risk reduction: atorvastatin 10 mg daily   Pharmacist Clinical Goal(s):  Marland Kitchen Over the next 90 days, patient will work with PharmD and provider towards optimized medication management  Interventions: . Congratulated patient on successful transition to [place] therapy. Reiterated to feel free to outreach with any questions or concerns  Patient Self Care Activities:  . Patient will take medications as prescribed  Please see past updates related to this goal by clicking on the "Past Updates" button in the selected goal          Plan:  - Patient has my contact information for future questions or concerns  Catie Darnelle Maffucci, PharmD, [place] [phone]

## 2019-03-23 NOTE — Patient Instructions (Signed)
Visit Information  Goals Addressed            This Visit's Progress     Patient Stated   . PharmD "I don't like taking warfarin" (pt-stated)       Current Barriers:  . Polypharmacy; complex patient with multiple comorbidities including SLE, hx DVT, HTN, HLD, OA . Collaborated w/ rheumatology and hem/onc to determine if patient was an appropriate candidate for DOAC therapy for DVT ppx, rather than warfarin therapy. As antilupus anticoagulant negative, appropriate to switch. Transitioned to Eliquis therapy.  . Notes today that everything is "going great" with Eliquis therapy. Denies any side effects or cost concerns with the medication  o SLE: hydroxychloroquine 200 mg QAM. Follows w/ Dr. Estanislado Pandy; previously followed by Dr. Jefm Bryant;  o Hx DVT: Eliquis 5 mg BID  o HTN: amlodipine 5 mg daily, HCTZ 25 mg daily, lisinopril 20 mg daily o ASCVD risk reduction: atorvastatin 10 mg daily   Pharmacist Clinical Goal(s):  Marland Kitchen Over the next 90 days, patient will work with PharmD and provider towards optimized medication management  Interventions: . Congratulated patient on successful transition to [place] therapy. Reiterated to feel free to outreach with any questions or concerns  Patient Self Care Activities:  . Patient will take medications as prescribed  Please see past updates related to this goal by clicking on the "Past Updates" button in the selected goal         Patient verbalizes understanding of instructions provided today.   Plan:  - Patient has my contact information for future questions or concerns  Catie Darnelle Maffucci, PharmD, [place]  [phone]

## 2019-04-12 ENCOUNTER — Other Ambulatory Visit: Payer: Self-pay | Admitting: Nurse Practitioner

## 2019-04-12 NOTE — Telephone Encounter (Signed)
LOV:01.15.21

## 2019-04-12 NOTE — Telephone Encounter (Signed)
Requested  medications are  due for refill today yes  Requested medications are on the active medication list yes  Last refill 01/17/2019  Future visit scheduled 08/2019  Notes to clinic protocol states visit within 6 months

## 2019-05-07 NOTE — Progress Notes (Signed)
Office Visit Note  Patient: Dwayne Jacobson             Date of Birth: 06/27/1955           MRN: [id number]             PCP: Venita Lick, NP Referring: Venita Lick, NP Visit Date: 05/18/2019 Occupation: @GUAROCC @  Subjective:  Medication monitoring   History of Present Illness: Dwayne Jacobson is a 64 y.o. male with history of systemic lupus erythematosus, osteoarthritis, chondrocalcinosis.  Patient is taking Plaquenil 200 mg 1 tablet by mouth daily.  He has not missed any doses of Plaquenil recently.  He denies any recent lupus flares.  He continues to experience arthralgias but has not had any joint swelling.  He denies any recent rashes, photosensitivity, or hair loss.  He continues to have intermittent symptoms of Raynaud's but no digital ulcerations.  He has not had any oral nasal ulcerations.  He continues to have chronic eye dryness but has not had any mouth dryness recently.  He denies any chest pain or shortness of breath.  Activities of Daily Living:  Patient reports morning stiffness for 0 none.   Patient Denies nocturnal pain.  Difficulty dressing/grooming: Denies Difficulty climbing stairs: Denies Difficulty getting out of chair: Denies Difficulty using hands for taps, buttons, cutlery, and/or writing: Denies  Review of Systems  Constitutional: Positive for fatigue. Negative for night sweats.  HENT: Negative for mouth sores, mouth dryness and nose dryness.   Eyes: Positive for dryness. Negative for redness.  Respiratory: Negative for shortness of breath and difficulty breathing.   Cardiovascular: Negative for chest pain, palpitations, hypertension, irregular heartbeat and swelling in legs/feet.  Gastrointestinal: Negative for blood in stool, constipation and diarrhea.  Endocrine: Negative for excessive thirst and increased urination.  Genitourinary: Negative for difficulty urinating and painful urination.  Musculoskeletal: Positive for arthralgias and joint  pain. Negative for joint swelling, myalgias, muscle weakness, morning stiffness, muscle tenderness and myalgias.  Skin: Negative for color change, rash, hair loss, nodules/bumps, skin tightness, ulcers and sensitivity to sunlight.  Allergic/Immunologic: Negative for susceptible to infections.  Neurological: Negative for dizziness, fainting, numbness, memory loss, night sweats and weakness.  Hematological: Negative for bruising/bleeding tendency and swollen glands.  Psychiatric/Behavioral: Negative for depressed mood and sleep disturbance. The patient is not nervous/anxious.     PMFS History:  Patient Active Problem List   Diagnosis Date Noted  . Dysphagia 11/07/2018  . Traumatic rupture of biceps tendon 07/25/2018  . Chondrocalcinosis 01/13/2017  . Primary osteoarthritis of both knees 01/13/2017  . DVT (deep venous thrombosis) ([place])   . Hypertension   . Hyperlipidemia   . SLE (systemic lupus erythematosus) ([place]) 01/18/2014    Past Medical History:  Diagnosis Date  . DVT (deep venous thrombosis) ([place])   . Hyperlipidemia   . Hypertension   . Seizures ([place])   . Skin cancer   . Systemic lupus erythematosus (HCC)     Family History  Problem Relation Age of Onset  . Diabetes Mother   . Hypertension Mother   . Hyperlipidemia Mother   . Stroke Mother   . Lung disease Mother   . Heart disease Mother   . Heart disease Father   . Lung disease Father   . Diabetes Sister   . Colon polyps Maternal Grandmother    Past Surgical History:  Procedure Laterality Date  . NO PAST SURGERIES     Social History   Social History Narrative  .  Not on file   Immunization History  Administered Date(s) Administered  . Influenza,inj,Quad PF,6+ Mos 01/03/2015, 11/05/2016, 11/07/2018  . Pneumococcal Polysaccharide-23 09/29/2012  . Td 03/25/2006, 08/06/2016     Objective: Vital Signs: BP 118/66 (BP Location: Left Arm, Patient Position: Sitting, Cuff Size: Normal)   Pulse (!) 57   Resp 16    Ht 5\' 11"  (1.803 m)   Wt 199 lb 6.4 oz (90.4 kg)   BMI 27.81 kg/m    Physical Exam Vitals and nursing note reviewed.  Constitutional:      Appearance: He is well-developed.  HENT:     Head: Normocephalic and atraumatic.  Eyes:     Conjunctiva/sclera: Conjunctivae normal.     Pupils: Pupils are equal, round, and reactive to light.  Pulmonary:     Effort: Pulmonary effort is normal.  Abdominal:     General: Bowel sounds are normal.     Palpations: Abdomen is soft.  Musculoskeletal:     Cervical back: Normal range of motion and neck supple.  Skin:    General: Skin is warm and dry.     Capillary Refill: Capillary refill takes less than 2 seconds.  Neurological:     Mental Status: He is alert and oriented to person, place, and time.  Psychiatric:        Behavior: Behavior normal.      Musculoskeletal Exam: C-spine good range of motion.  Postural thoracic kyphosis noted.  Shoulder joints, elbow joints, wrist joints, MCPs, PIPs and DIPs good range of motion with no synovitis.  PIP and DIP thickening consistent with osteoarthritis of both hands.  He has complete fist formation bilaterally.  Hip joints have good range of motion with no discomfort.  Knee joints have good range of motion no warmth or effusion.  Ankle joints have good range of motion no tenderness or inflammation.  CDAI Exam: CDAI Score: -- Patient Global: --; Provider Global: -- Swollen: --; Tender: -- Joint Exam 05/18/2019   No joint exam has been documented for this visit   There is currently no information documented on the homunculus. Go to the Rheumatology activity and complete the homunculus joint exam.  Investigation: No additional findings.  Imaging: No results found.  Recent Labs: Lab Results  Component Value Date   WBC 3.8 11/07/2018   HGB 13.6 11/07/2018   PLT 292 11/07/2018   NA 133 (L) 11/07/2018   K 4.5 11/07/2018   CL 94 (L) 11/07/2018   CO2 25 11/07/2018   GLUCOSE 97 11/07/2018   BUN  14 11/07/2018   CREATININE 0.87 11/07/2018   BILITOT 0.2 11/07/2018   ALKPHOS 78 11/07/2018   AST 33 11/07/2018   ALT 41 11/07/2018   PROT 7.3 11/07/2018   ALBUMIN 4.7 11/07/2018   CALCIUM 9.8 11/07/2018   GFRAA 106 11/07/2018    Speciality Comments: PLQ Eye Exam: 10/29/16 WNL @ [facility]  Procedures:  No procedures performed Allergies: Patient has no known allergies.   Assessment / Plan:     Visit Diagnoses: Other systemic lupus erythematosus with other organ involvement ([place]) - Positive dsDNA, positive Ro. History of rash, diagnosed with lupus in the past: He has not had any signs or symptoms of a systemic lupus flare recently.  He is clinically doing well on Plaquenil 200 mg 1 tablet by mouth daily.  He has not missed any doses of Plaquenil and is tolerating without any side effects.  He continues to have arthralgias but has not had any joint  inflammation.  No synovitis was noted on exam.  He has not had any recent rashes.  No malar rash was noted on exam.  We discussed the importance of wearing sunscreen greater than SPF 50 on a daily basis and avoiding direct sun exposure.  He continues have intermittent symptoms of Raynaud's but no digital ulcerations or signs of gangrene were noted.  He has slightly delayed capillary refill 2 to 3 seconds.  He has not had any oral or nasal ulcerations.  He experiences eye dryness due to seasonal allergies but has not had any mouth dryness.  He has not had any chest pain, palpitations, or shortness of breath.  We will check autoimmune lab work today.  Autoimmune labs from 07/14/2018 were reviewed with the patient today in the office.  He will continue taking Plaquenil as prescribed.  He is advised to notify us if he develops any new or worsening symptoms.  He will follow-up in the office in 5 months.- Plan: CBC with Differential/Platelet, COMPLETE METABOLIC PANEL WITH GFR, Urinalysis, Routine w reflex microscopic, Anti-DNA antibody, double-stranded,  C3 and C4, Sedimentation rate  High risk medication use - Plaquenil 200 mg 1 tablet by mouth daily. Eye Exam: 10/29/16.  Patient reports that he thinks he has had a Plaquenil eye exam in the past 1 year.  He was advised to schedule an updated Plaquenil eye exam ASAP.  He was given a Plaquenil eye exam form to take with him to his upcoming appointment.  We discussed the risks of not having yearly eye exams while taking Plaquenil.  CBC and CMP were drawn on 11/07/2018.  He is due to update lab work today.  Orders were released.- Plan: CBC with Differential/Platelet, COMPLETE METABOLIC PANEL WITH GFR  Primary osteoarthritis of both knees: He has good range of motion of both knee joints on exam.  No warmth or effusion was noted.  He has bilateral knee crepitus.  Experiences intermittent pain and stiffness in both knee joints.  Chondrocalcinosis: He has not had any recent flares.  He continues to take amlodipine and HCTZ as prescribed.  Essential hypertension: Blood pressure is well controlled in the office: 118/66.  History of DVT (deep vein thrombosis): He is taking Eliquis as prescribed.  Mixed hyperlipidemia: He continues to take Lipitor as prescribed.  Orders: Orders Placed This Encounter  Procedures  . CBC with Differential/Platelet  . COMPLETE METABOLIC PANEL WITH GFR  . Urinalysis, Routine w reflex microscopic  . Anti-DNA antibody, double-stranded  . C3 and C4  . Sedimentation rate   No orders of the defined types were placed in this encounter.     Follow-Up Instructions: Return in about 5 months (around 10/18/2019) for Systemic lupus erythematosus.   Ofilia Neas, PA-C  Note - This record has been created using Dragon software.  Chart creation errors have been sought, but may not always  have been located. Such creation errors do not reflect on  the standard of medical care.

## 2019-05-18 ENCOUNTER — Encounter: Payer: Self-pay | Admitting: Physician Assistant

## 2019-05-18 ENCOUNTER — Ambulatory Visit: Payer: 59 | Admitting: Physician Assistant

## 2019-05-18 ENCOUNTER — Other Ambulatory Visit: Payer: Self-pay

## 2019-05-18 VITALS — BP 118/66 | HR 57 | Resp 16 | Ht 71.0 in | Wt 199.4 lb

## 2019-05-18 DIAGNOSIS — Z86718 Personal history of other venous thrombosis and embolism: Secondary | ICD-10-CM

## 2019-05-18 DIAGNOSIS — M3219 Other organ or system involvement in systemic lupus erythematosus: Secondary | ICD-10-CM

## 2019-05-18 DIAGNOSIS — Z79899 Other long term (current) drug therapy: Secondary | ICD-10-CM

## 2019-05-18 DIAGNOSIS — M1120 Other chondrocalcinosis, unspecified site: Secondary | ICD-10-CM | POA: Diagnosis not present

## 2019-05-18 DIAGNOSIS — I10 Essential (primary) hypertension: Secondary | ICD-10-CM

## 2019-05-18 DIAGNOSIS — M170 Bilateral primary osteoarthritis of knee: Secondary | ICD-10-CM | POA: Diagnosis not present

## 2019-05-18 DIAGNOSIS — E782 Mixed hyperlipidemia: Secondary | ICD-10-CM

## 2019-05-21 LAB — CBC WITH DIFFERENTIAL/PLATELET
Absolute Monocytes: 559 cells/uL (ref 200–950)
Basophils Absolute: 30 cells/uL (ref 0–200)
Basophils Relative: 0.7 %
Eosinophils Absolute: 159 cells/uL (ref 15–500)
Eosinophils Relative: 3.7 %
HCT: 43.3 % (ref 38.5–50.0)
Hemoglobin: 14.3 g/dL (ref 13.2–17.1)
Lymphs Abs: 860 cells/uL (ref 850–3900)
MCH: 30.5 pg (ref 27.0–33.0)
MCHC: 33 g/dL (ref 32.0–36.0)
MCV: 92.3 fL (ref 80.0–100.0)
MPV: 9.7 fL (ref 7.5–12.5)
Monocytes Relative: 13 %
Neutro Abs: 2692 cells/uL (ref 1500–7800)
Neutrophils Relative %: 62.6 %
Platelets: 274 10*3/uL (ref 140–400)
RBC: 4.69 10*6/uL (ref 4.20–5.80)
RDW: 12.7 % (ref 11.0–15.0)
Total Lymphocyte: 20 %
WBC: 4.3 10*3/uL (ref 3.8–10.8)

## 2019-05-21 LAB — COMPLETE METABOLIC PANEL WITH GFR
AG Ratio: 1.8 (calc) (ref 1.0–2.5)
ALT: 25 U/L (ref 9–46)
AST: 24 U/L (ref 10–35)
Albumin: 4.8 g/dL (ref 3.6–5.1)
Alkaline phosphatase (APISO): 59 U/L (ref 35–144)
BUN: 12 mg/dL (ref 7–25)
CO2: 30 mmol/L (ref 20–32)
Calcium: 10.1 mg/dL (ref 8.6–10.3)
Chloride: 98 mmol/L (ref 98–110)
Creat: 0.85 mg/dL (ref 0.70–1.25)
GFR, Est African American: 107 mL/min/{1.73_m2} (ref >=60)
GFR, Est Non African American: 92 mL/min/{1.73_m2} (ref >=60)
Globulin: 2.7 g/dL (calc) (ref 1.9–3.7)
Glucose, Bld: 100 mg/dL — ABNORMAL HIGH (ref 65–99)
Potassium: 4.4 mmol/L (ref 3.5–5.3)
Sodium: 137 mmol/L (ref 135–146)
Total Bilirubin: 0.4 mg/dL (ref 0.2–1.2)
Total Protein: 7.5 g/dL (ref 6.1–8.1)

## 2019-05-21 LAB — ANTI-DNA ANTIBODY, DOUBLE-STRANDED: ds DNA Ab: 9 IU/mL — ABNORMAL HIGH

## 2019-05-21 LAB — URINALYSIS, ROUTINE W REFLEX MICROSCOPIC
Bilirubin Urine: NEGATIVE
Glucose, UA: NEGATIVE
Hgb urine dipstick: NEGATIVE
Ketones, ur: NEGATIVE
Leukocytes,Ua: NEGATIVE
Nitrite: NEGATIVE
Protein, ur: NEGATIVE
Specific Gravity, Urine: 1.015 (ref 1.001–1.03)
pH: 7.5 (ref 5.0–8.0)

## 2019-05-21 LAB — SEDIMENTATION RATE: Sed Rate: 6 mm/h (ref 0–20)

## 2019-05-21 LAB — C3 AND C4
C3 Complement: 131 mg/dL (ref 82–185)
C4 Complement: 29 mg/dL (ref 15–53)

## 2019-05-21 NOTE — Progress Notes (Signed)
CBC and CMP WNL.  Complements WNL.  ESR WNL.  UA normal.

## 2019-05-21 NOTE — Progress Notes (Signed)
DsDNA is 9-stable. He has not had any signs or symptoms of a lupus flare.  Please advise the patient to notify us if he develops any new or worsening symptoms.   No change in therapy recommended at this time.

## 2019-07-04 ENCOUNTER — Other Ambulatory Visit: Payer: Self-pay | Admitting: Nurse Practitioner

## 2019-07-04 NOTE — Telephone Encounter (Signed)
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, unspecified laterality ([place])   [place], Jolene T, NP   6 months ago Deep vein thrombosis (DVT) of femoral vein, unspecified chronicity, unspecified laterality [facility])   [facility], [place], PA-C   6 months ago Deep vein thrombosis (DVT) of femoral vein, unspecified chronicity, unspecified laterality ([place])   [place], Jolene T, NP   7 months ago Other systemic lupus erythematosus with other organ involvement ([place])   [place], Barbaraann Faster, NP   9 months ago Encounter for monitoring Coumadin therapy   [place], Henrine Screws T, NP      Future Appointments            In 1 month [place], Barbaraann Faster, NP [employer], [facility]   In 3 months [place], Geni Bers, PA-C [facility]            lisinopril (ZESTRIL) 20 MG tablet [Pharmacy Med Name: LISINOPRIL 20 MG TABLET] 90 tablet 0    Sig: TAKE 1 TABLET BY MOUTH EVERY DAY     Cardiovascular:  ACE Inhibitors Passed - 07/04/2019  1:23 AM      Passed - Cr in normal range and within 180 days    Creat  Date Value Ref Range Status  05/18/2019 0.85 0.70 - 1.25 mg/dL Final    Comment:    For patients >50 years of age, the reference limit for Creatinine is approximately 13% higher for people identified as African-American. .          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, unspecified laterality ([place])   [place], Jolene T, NP   6 months ago Deep vein thrombosis (DVT) of femoral vein, unspecified chronicity, unspecified laterality [facility])   [facility], [place], PA-C   6 months ago Deep vein thrombosis (DVT) of femoral vein, unspecified chronicity, unspecified laterality ([place])   [place], Jolene T, NP   7 months ago Other systemic lupus erythematosus with other organ involvement ([place])   [place], Barbaraann Faster, NP   9 months ago Encounter for monitoring Coumadin therapy   [facility] [place], Barbaraann Faster, NP  Future Appointments            In 1 month Cannady, Barbaraann Faster, NP [employer], [place]   In 3 months [place], Blinda [place] [facility]

## 2019-08-03 ENCOUNTER — Encounter: Payer: Self-pay | Admitting: Rheumatology

## 2019-08-04 ENCOUNTER — Other Ambulatory Visit: Payer: Self-pay | Admitting: Nurse Practitioner

## 2019-08-04 NOTE — Telephone Encounter (Signed)
Requested Prescriptions  Pending Prescriptions Disp Refills  . 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, unspecified laterality ([place])   [place]ady, Jolene T, NP   7 months ago Deep vein thrombosis (DVT) of femoral vein, unspecified chronicity, unspecified laterality [facility])   [facility] [place], [place], PA-C   7 months ago Deep vein thrombosis (DVT) of femoral vein, unspecified chronicity, unspecified laterality ([place])   [place], Jolene T, NP   9 months ago Other systemic lupus erythematosus with other organ involvement ([place])   [place], Barbaraann Faster, NP   10 months ago Encounter for monitoring Coumadin therapy   [place], Barbaraann Faster, NP      Future Appointments            In 1 week Venita Lick, NP [employer], [place]   In 2 months [place], Blinda [place] [facility]

## 2019-08-07 ENCOUNTER — Other Ambulatory Visit: Payer: Self-pay | Admitting: Nurse Practitioner

## 2019-08-07 MED ORDER — HYDROCHLOROTHIAZIDE 25 MG PO TABS: ORAL_TABLET | ORAL | 3 refills | Status: DC

## 2019-08-07 NOTE — Telephone Encounter (Signed)
Medication Refill - Medication: hydrochlorothiazide (HYDRODIURIL) 25 MG tablet [[id number]]      Preferred Pharmacy (with phone number or street name):  [id number] - [place], [address]  [address] [address]  Phone: [phone] Fax: [phone]     Agent: Please be advised that RX refills may take up to 3 business days. We ask that you follow-up with your pharmacy.

## 2019-08-17 ENCOUNTER — Other Ambulatory Visit: Payer: Self-pay

## 2019-08-17 ENCOUNTER — Ambulatory Visit (INDEPENDENT_AMBULATORY_CARE_PROVIDER_SITE_OTHER): Payer: No Typology Code available for payment source | Admitting: Nurse Practitioner

## 2019-08-17 ENCOUNTER — Encounter: Payer: Self-pay | Admitting: Nurse Practitioner

## 2019-08-17 VITALS — BP 108/69 | HR 67 | Temp 98.6°F | Ht 69.5 in | Wt 191.0 lb

## 2019-08-17 DIAGNOSIS — K219 Gastro-esophageal reflux disease without esophagitis: Secondary | ICD-10-CM

## 2019-08-17 DIAGNOSIS — I10 Essential (primary) hypertension: Secondary | ICD-10-CM | POA: Diagnosis not present

## 2019-08-17 DIAGNOSIS — E782 Mixed hyperlipidemia: Secondary | ICD-10-CM

## 2019-08-17 DIAGNOSIS — M3219 Other organ or system involvement in systemic lupus erythematosus: Secondary | ICD-10-CM | POA: Diagnosis not present

## 2019-08-17 DIAGNOSIS — Z125 Encounter for screening for malignant neoplasm of prostate: Secondary | ICD-10-CM | POA: Diagnosis not present

## 2019-08-17 DIAGNOSIS — I82519 Chronic embolism and thrombosis of unspecified femoral vein: Secondary | ICD-10-CM

## 2019-08-17 DIAGNOSIS — Z0000 Encounter for general adult medical examination without abnormal findings: Secondary | ICD-10-CM

## 2019-08-17 MED ORDER — PANTOPRAZOLE SODIUM 40 MG PO TBEC: 40.0000 mg | DELAYED_RELEASE_TABLET | Freq: Every day | ORAL | 4 refills | Status: DC

## 2019-08-17 MED ORDER — AMLODIPINE BESYLATE 5 MG PO TABS: 5.0000 mg | ORAL_TABLET | Freq: Every day | ORAL | 4 refills | Status: DC

## 2019-08-17 MED ORDER — LISINOPRIL 20 MG PO TABS: 20.0000 mg | ORAL_TABLET | Freq: Every day | ORAL | 4 refills | Status: DC

## 2019-08-17 MED ORDER — ATORVASTATIN CALCIUM 10 MG PO TABS: 10.0000 mg | ORAL_TABLET | Freq: Every day | ORAL | 4 refills | Status: DC

## 2019-08-17 NOTE — Assessment & Plan Note (Signed)
Will continue collaboration with his rheumatologist Dr. Deveshwar.  Continue current medication regimen as prescribed by them. 

## 2019-08-17 NOTE — Assessment & Plan Note (Signed)
Chronic, stable with BP below goal.  Continue current medication regimen and adjust as needed.  Recommend he monitor BP at least weekly at home and document + focus on DASH diet.  CMP and TSH today.  Return in 6 months.

## 2019-08-17 NOTE — Assessment & Plan Note (Signed)
Chronic, stable with Protonix.  Continue current medication regimen and consider reduction in future.  Mag level annually.

## 2019-08-17 NOTE — Addendum Note (Signed)
Addended by: Marnee Guarneri T on: 08/17/2019 08:40 AM   Modules accepted: Orders

## 2019-08-17 NOTE — Patient Instructions (Signed)
Healthy Eating Following a healthy eating pattern may help you to achieve and maintain a healthy body weight, reduce the risk of chronic disease, and live a long and productive life. It is important to follow a healthy eating pattern at an appropriate calorie level for your body. Your nutritional needs should be met primarily through food by choosing a variety of nutrient-rich foods. What are tips for following this plan? Reading food labels  Read labels and choose the following: ? Reduced or low sodium. ? Juices with 100% fruit juice. ? Foods with low saturated fats and high polyunsaturated and monounsaturated fats. ? Foods with whole grains, such as whole wheat, cracked wheat, brown rice, and wild rice. ? Whole grains that are fortified with folic acid. This is recommended for women who are pregnant or who want to become pregnant.  Read labels and avoid the following: ? Foods with a lot of added sugars. These include foods that contain brown sugar, corn sweetener, corn syrup, dextrose, fructose, glucose, high-fructose corn syrup, honey, invert sugar, lactose, malt syrup, maltose, molasses, raw sugar, sucrose, trehalose, or turbinado sugar.  Do not eat more than the following amounts of added sugar per day:  6 teaspoons (25 g) for women.  9 teaspoons (38 g) for men. ? Foods that contain processed or refined starches and grains. ? Refined grain products, such as white flour, degermed cornmeal, white bread, and white rice. Shopping  Choose nutrient-rich snacks, such as vegetables, whole fruits, and nuts. Avoid high-calorie and high-sugar snacks, such as potato chips, fruit snacks, and candy.  Use oil-based dressings and spreads on foods instead of solid fats such as butter, stick margarine, or cream cheese.  Limit pre-made sauces, mixes, and "instant" products such as flavored rice, instant noodles, and ready-made pasta.  Try more plant-protein sources, such as tofu, tempeh, black beans,  edamame, lentils, nuts, and seeds.  Explore eating plans such as the Mediterranean diet or vegetarian diet. Cooking  Use oil to saut or stir-fry foods instead of solid fats such as butter, stick margarine, or lard.  Try baking, boiling, grilling, or broiling instead of frying.  Remove the fatty part of meats before cooking.  Steam vegetables in water or broth. Meal planning   At meals, imagine dividing your plate into fourths: ? One-half of your plate is fruits and vegetables. ? One-fourth of your plate is whole grains. ? One-fourth of your plate is protein, especially lean meats, poultry, eggs, tofu, beans, or nuts.  Include low-fat dairy as part of your daily diet. Lifestyle  Choose healthy options in all settings, including home, work, school, restaurants, or stores.  Prepare your food safely: ? Wash your hands after handling raw meats. ? Keep food preparation surfaces clean by regularly washing with hot, soapy water. ? Keep raw meats separate from ready-to-eat foods, such as fruits and vegetables. ? Cook seafood, meat, poultry, and eggs to the recommended internal temperature. ? Store foods at safe temperatures. In general:  Keep cold foods at 59F (4.4C) or below.  Keep hot foods at 159F (60C) or above.  Keep your freezer at [facility] (-17.8C) or below.  Foods are no longer safe to eat when they have been between the temperatures of 40-159F (4.4-60C) for more than 2 hours. What foods should I eat? Fruits Aim to eat 2 cup-equivalents of fresh, canned (in natural juice), or frozen fruits each day. Examples of 1 cup-equivalent of fruit include 1 small apple, 8 large strawberries, 1 cup canned fruit,  cup  dried fruit, or 1 cup 100% juice. Vegetables Aim to eat 2-3 cup-equivalents of fresh and frozen vegetables each day, including different varieties and colors. Examples of 1 cup-equivalent of vegetables include 2 medium carrots, 2 cups raw, leafy greens, 1 cup chopped  vegetable (raw or cooked), or 1 medium baked potato. Grains Aim to eat 6 ounce-equivalents of whole grains each day. Examples of 1 ounce-equivalent of grains include 1 slice of bread, 1 cup ready-to-eat cereal, 3 cups popcorn, or  cup cooked rice, pasta, or cereal. Meats and other proteins Aim to eat 5-6 ounce-equivalents of protein each day. Examples of 1 ounce-equivalent of protein include 1 egg, 1/2 cup nuts or seeds, or 1 tablespoon (16 g) peanut butter. A cut of meat or fish that is the size of a deck of cards is about 3-4 ounce-equivalents.  Of the protein you eat each week, try to have at least 8 ounces come from seafood. This includes salmon, trout, herring, and anchovies. Dairy Aim to eat 3 cup-equivalents of fat-free or low-fat dairy each day. Examples of 1 cup-equivalent of dairy include 1 cup (240 mL) milk, 8 ounces (250 g) yogurt, 1 ounces (44 g) natural cheese, or 1 cup (240 mL) fortified soy milk. Fats and oils  Aim for about 5 teaspoons (21 g) per day. Choose monounsaturated fats, such as canola and olive oils, avocados, peanut butter, and most nuts, or polyunsaturated fats, such as sunflower, corn, and soybean oils, walnuts, pine nuts, sesame seeds, sunflower seeds, and flaxseed. Beverages  Aim for six 8-oz glasses of water per day. Limit coffee to three to five 8-oz cups per day.  Limit caffeinated beverages that have added calories, such as soda and energy drinks.  Limit alcohol intake to no more than 1 drink a day for nonpregnant women and 2 drinks a day for men. One drink equals 12 oz of beer (355 mL), 5 oz of wine (148 mL), or 1 oz of hard liquor (44 mL). Seasoning and other foods  Avoid adding excess amounts of salt to your foods. Try flavoring foods with herbs and spices instead of salt.  Avoid adding sugar to foods.  Try using oil-based dressings, sauces, and spreads instead of solid fats. This information is based on general [place] nutrition guidelines. For more  information, visit [employer].es. Exact amounts may vary based on your nutrition needs. Summary  A healthy eating plan may help you to maintain a healthy weight, reduce the risk of chronic diseases, and stay active throughout your life.  Plan your meals. Make sure you eat the right portions of a variety of nutrient-rich foods.  Try baking, boiling, grilling, or broiling instead of frying.  Choose healthy options in all settings, including home, work, school, restaurants, or stores. This information is not intended to replace advice given to you by your health care provider. Make sure you discuss any questions you have with your health care provider. Document Revised: 05/02/2017 Document Reviewed: 05/02/2017 Elsevier Patient Education  [place].

## 2019-08-17 NOTE — Assessment & Plan Note (Signed)
Chronic, stable on Eliquis.  Had hematology consult last year and able to transition to Eliquis, which he has done without issue.  CBC  And CMP today.

## 2019-08-17 NOTE — Progress Notes (Signed)
BP 108/69   Pulse 67   Temp 98.6 F (37 C) (Oral)   Ht 5' 9.5" (1.765 m)   Wt 191 lb (86.6 kg)   SpO2 97%   BMI 27.80 kg/m    Subjective:    Patient ID: Dwayne Jacobson, male    DOB: [birth year], 64 y.o.   MRN: [id number]  HPI: Dwayne Jacobson is a 64 y.o. male presenting on 08/17/2019 for comprehensive medical examination. Current medical complaints include:none  He currently lives with: wife Interim Problems from his last visit: no   HYPERTENSION / HYPERLIPIDEMIA Continues on Lisinopril, HCTZ, Atorvastatin, and Amlodipine.  Followed by Dr. Estanislado Pandy for SLE, taking Plaquenil daily with last visit with them on 05/18/19. Has history of a DVT and continues on Eliquis without issue. Satisfied with current treatment? yes Duration of hypertension: chronic BP monitoring frequency: not checking BP range: not checking BP medication side effects: no Duration of hyperlipidemia: chronic Cholesterol medication side effects: no Cholesterol supplements: none Medication compliance: good compliance Aspirin: no Recent stressors: no Recurrent headaches: no Visual changes: no Palpitations: no Dyspnea: no Chest pain: no Lower extremity edema: no Dizzy/lightheaded: no   GERD Continues on Protonix 40 MG daily without issue. GERD control status: stable  Satisfied with current treatment? yes Heartburn frequency: none Medication side effects: no  Medication compliance: stable Dysphagia: no Odynophagia:  no Hematemesis: no Blood in stool: no EGD: no  Functional Status Survey: Is the patient deaf or have difficulty hearing?: No Does the patient have difficulty seeing, even when wearing glasses/contacts?: No Does the patient have difficulty concentrating, remembering, or making decisions?: No Does the patient have difficulty walking or climbing stairs?: No Does the patient have difficulty dressing or bathing?: No Does the patient have difficulty doing errands alone such as visiting a  doctor's office or shopping?: No  FALL RISK: Fall Risk  08/17/2019 07/14/2018 05/12/2018 12/02/2017 03/04/2017  Falls in the past year? 0 0 0 0 No  Number falls in past yr: 0 - - 0 -  Injury with Fall? 0 - - 0 -  Risk for fall due to : No Fall Risks - - - -  Follow up Falls prevention discussed Falls evaluation completed Falls evaluation completed - -    Depression Screen Depression screen [facility] 2/9 08/17/2019 11/07/2018 09/30/2017 03/04/2017 01/30/2016  Decreased Interest 0 0 0 0 0  Down, Depressed, Hopeless 0 0 0 0 0  PHQ - 2 Score 0 0 0 0 0  Altered sleeping 0 - 3 0 -  Tired, decreased energy 2 - 3 1 -  Change in appetite 0 - 0 0 -  Feeling bad or failure about yourself  0 - 0 0 -  Trouble concentrating 0 - 0 0 -  Moving slowly or fidgety/restless 0 - 0 0 -  Suicidal thoughts 0 - 0 0 -  PHQ-9 Score 2 - 6 1 -  Difficult doing work/chores Not difficult at all - - - -    Advanced Directives <no information>  Past Medical History:  Past Medical History:  Diagnosis Date  . DVT (deep venous thrombosis) ([place])   . Hyperlipidemia   . Hypertension   . Seizures ([place])   . Skin cancer   . Systemic lupus erythematosus ([place])     Surgical History:  Past Surgical History:  Procedure Laterality Date  . NO PAST SURGERIES      Medications:  Current Outpatient Medications on File Prior to Visit  Medication Sig  .  amLODipine (NORVASC) 5 MG tablet TAKE 1 TABLET BY MOUTH EVERY DAY  . apixaban (ELIQUIS) 5 MG TABS tablet Take 1 tablet (5 mg total) by mouth 2 (two) times daily.  Marland Kitchen atorvastatin (LIPITOR) 10 MG tablet TAKE 1 TABLET BY MOUTH EVERY EVENING AT 6:00PM  . cyclobenzaprine (FLEXERIL) 10 MG tablet Take 10 mg by mouth 3 (three) times daily as needed.   . hydrochlorothiazide (HYDRODIURIL) 25 MG tablet TAKE 1 TABLET BY MOUTH EVERY DAY IN THE MORNING  . hydroxychloroquine (PLAQUENIL) 200 MG tablet Take 200 mg by mouth daily.  Marland Kitchen lisinopril (ZESTRIL) 20 MG tablet TAKE 1 TABLET BY MOUTH EVERY DAY    . Multiple Vitamin (MULTIVITAMIN) tablet Take 1 tablet by mouth daily.  Marland Kitchen olopatadine (PATANOL) 0.1 % ophthalmic solution olopatadine 0.1 % eye drops  . pantoprazole (PROTONIX) 40 MG tablet TAKE 1 TABLET BY MOUTH EVERY DAY   No current facility-administered medications on file prior to visit.    Allergies:  No Known Allergies  Social History:  Social History   Socioeconomic History  . Marital status: Married    Spouse name: Not on file  . Number of children: Not on file  . Years of education: Not on file  . Highest education level: Not on file  Occupational History  . Not on file  Tobacco Use  . Smoking status: Former Smoker    Packs/day: 1.50    Years: 40.00    Pack years: 60.00    Types: Cigarettes    Quit date: 08/30/2013    Years since quitting: 5.9  . Smokeless tobacco: Former Systems developer    Types: Secondary school teacher  . Vaping Use: Every day  . Substances: Nicotine  Substance and Sexual Activity  . Alcohol use: Yes    Alcohol/week: 8.0 - 10.0 standard drinks    Types: 8 - 10 Cans of beer per week  . Drug use: Never  . Sexual activity: Yes  Other Topics Concern  . Not on file  Social History Narrative  . Not on file   Social Determinants of Health   Financial Resource Strain:   . Difficulty of Paying Living Expenses:   Food Insecurity:   . Worried About Charity fundraiser in the Last Year:   . Arboriculturist in the Last Year:   Transportation Needs:   . Film/video editor (Medical):   Marland Kitchen Lack of Transportation (Non-Medical):   Physical Activity:   . Days of Exercise per Week:   . Minutes of Exercise per Session:   Stress:   . Feeling of Stress :   Social Connections:   . Frequency of Communication with Friends and Family:   . Frequency of Social Gatherings with Friends and Family:   . Attends Religious Services:   . Active Member of Clubs or Organizations:   . Attends Archivist Meetings:   Marland Kitchen Marital Status:   Intimate Partner Violence:    . Fear of Current or Ex-Partner:   . Emotionally Abused:   Marland Kitchen Physically Abused:   . Sexually Abused:    Social History   Tobacco Use  Smoking Status Former Smoker  . Packs/day: 1.50  . Years: 40.00  . Pack years: 60.00  . Types: Cigarettes  . Quit date: 08/30/2013  . Years since quitting: 5.9  Smokeless Tobacco Former Systems developer  . Types: Chew   Social History   Substance and Sexual Activity  Alcohol Use Yes  . Alcohol/week: 8.0 - 10.0  standard drinks  . Types: 8 - 10 Cans of beer per week    Family History:  Family History  Problem Relation Age of Onset  . Diabetes Mother   . Hypertension Mother   . Hyperlipidemia Mother   . Stroke Mother   . Lung disease Mother   . Heart disease Mother   . Heart disease Father   . Lung disease Father   . Diabetes Sister   . Colon polyps Maternal Grandmother     Past medical history, surgical history, medications, allergies, family history and social history reviewed with patient today and changes made to appropriate areas of the chart.   Review of Systems - negative All other ROS negative except what is listed above and in the HPI.      Objective:    BP 108/69   Pulse 67   Temp 98.6 F (37 C) (Oral)   Ht 5' 9.5" (1.765 m)   Wt 191 lb (86.6 kg)   SpO2 97%   BMI 27.80 kg/m   Wt Readings from Last 3 Encounters:  08/17/19 191 lb (86.6 kg)  05/18/19 199 lb 6.4 oz (90.4 kg)  01/30/19 199 lb (90.3 kg)    Physical Exam Vitals and nursing note reviewed.  Constitutional:      General: He is awake. He is not in acute distress.    Appearance: He is well-developed and well-groomed. He is not ill-appearing.  HENT:     Head: Normocephalic and atraumatic.     Right Ear: Hearing, tympanic membrane, ear canal and external ear normal. No drainage.     Left Ear: Hearing, tympanic membrane, ear canal and external ear normal. No drainage.     Nose: Nose normal.     Mouth/Throat:     Pharynx: Uvula midline.  Eyes:     General: Lids  are normal.        Right eye: No discharge.        Left eye: No discharge.     Extraocular Movements: Extraocular movements intact.     Conjunctiva/sclera: Conjunctivae normal.     Pupils: Pupils are equal, round, and reactive to light.     Visual Fields: Right eye visual fields normal and left eye visual fields normal.  Neck:     Thyroid: No thyromegaly.     Vascular: No carotid bruit or JVD.     Trachea: Trachea normal.  Cardiovascular:     Rate and Rhythm: Normal rate and regular rhythm.     Heart sounds: Normal heart sounds, S1 normal and S2 normal. No murmur heard.  No gallop.   Pulmonary:     Effort: Pulmonary effort is normal. No accessory muscle usage or respiratory distress.     Breath sounds: Normal breath sounds.  Abdominal:     General: Bowel sounds are normal.     Palpations: Abdomen is soft. There is no hepatomegaly or splenomegaly.     Tenderness: There is no abdominal tenderness.  Musculoskeletal:        General: Normal range of motion.     Cervical back: Normal range of motion and neck supple.     Right lower leg: No edema.     Left lower leg: No edema.  Lymphadenopathy:     Head:     Right side of head: No submental, submandibular, tonsillar, preauricular or posterior auricular adenopathy.     Left side of head: No submental, submandibular, tonsillar, preauricular or posterior auricular adenopathy.  Cervical: No cervical adenopathy.  Skin:    General: Skin is warm and dry.     Capillary Refill: Capillary refill takes less than 2 seconds.     Findings: No rash.  Neurological:     Mental Status: He is alert and oriented to person, place, and time.     Cranial Nerves: Cranial nerves are intact.     Gait: Gait is intact.     Deep Tendon Reflexes: Reflexes are normal and symmetric.     Reflex Scores:      Brachioradialis reflexes are 2+ on the right side and 2+ on the left side.      Patellar reflexes are 2+ on the right side and 2+ on the left  side. Psychiatric:        Attention and Perception: Attention normal.        Mood and Affect: Mood normal.        Speech: Speech normal.        Behavior: Behavior normal. Behavior is cooperative.        Thought Content: Thought content normal.        Cognition and Memory: Cognition normal.        Judgment: Judgment normal.    Results for orders placed or performed in visit on 05/18/19  CBC with Differential/Platelet  Result Value Ref Range   WBC 4.3 3.8 - 10.8 Thousand/uL   RBC 4.69 4.20 - 5.80 Million/uL   Hemoglobin 14.3 13.2 - 17.1 g/dL   HCT 43.3 38 - 50 %   MCV 92.3 80.0 - 100.0 fL   MCH 30.5 27.0 - 33.0 pg   MCHC 33.0 32.0 - 36.0 g/dL   RDW 12.7 11.0 - 15.0 %   Platelets 274 140 - 400 Thousand/uL   MPV 9.7 7.5 - 12.5 fL   Neutro Abs 2,692 1,500 - 7,800 cells/uL   Lymphs Abs 860 850 - 3,900 cells/uL   Absolute Monocytes 559 200 - 950 cells/uL   Eosinophils Absolute 159 15 - 500 cells/uL   Basophils Absolute 30 0 - 200 cells/uL   Neutrophils Relative % 62.6 %   Total Lymphocyte 20.0 %   Monocytes Relative 13.0 %   Eosinophils Relative 3.7 %   Basophils Relative 0.7 %  COMPLETE METABOLIC PANEL WITH GFR  Result Value Ref Range   Glucose, Bld 100 (H) 65 - 99 mg/dL   BUN 12 7 - 25 mg/dL   Creat 0.85 0.70 - 1.25 mg/dL   GFR, Est Non African American 92 > OR = 60 mL/min/1.9m2   GFR, Est African American 107 > OR = 60 mL/min/1.13m2   BUN/Creatinine Ratio NOT APPLICABLE 6 - 22 (calc)   Sodium 137 135 - 146 mmol/L   Potassium 4.4 3.5 - 5.3 mmol/L   Chloride 98 98 - 110 mmol/L   CO2 30 20 - 32 mmol/L   Calcium 10.1 8.6 - 10.3 mg/dL   Total Protein 7.5 6.1 - 8.1 g/dL   Albumin 4.8 3.6 - 5.1 g/dL   Globulin 2.7 1.9 - 3.7 g/dL (calc)   AG Ratio 1.8 1.0 - 2.5 (calc)   Total Bilirubin 0.4 0.2 - 1.2 mg/dL   Alkaline phosphatase (APISO) 59 35 - 144 U/L   AST 24 10 - 35 U/L   ALT 25 9 - 46 U/L  Urinalysis, Routine w reflex microscopic  Result Value Ref Range   Color, Urine  YELLOW YELLOW   APPearance CLEAR CLEAR   Specific Gravity, Urine 1.015 1.001 -  1.03   pH 7.5 5.0 - 8.0   Glucose, UA NEGATIVE NEGATIVE   Bilirubin Urine NEGATIVE NEGATIVE   Ketones, ur NEGATIVE NEGATIVE   Hgb urine dipstick NEGATIVE NEGATIVE   Protein, ur NEGATIVE NEGATIVE   Nitrite NEGATIVE NEGATIVE   Leukocytes,Ua NEGATIVE NEGATIVE  Anti-DNA antibody, double-stranded  Result Value Ref Range   ds DNA Ab 9 (H) IU/mL  C3 and C4  Result Value Ref Range   C3 Complement 131 82 - 185 mg/dL   C4 Complement 29 15 - 53 mg/dL  Sedimentation rate  Result Value Ref Range   Sed Rate 6 0 - 20 mm/h      Assessment & Plan:   Problem List Items Addressed This Visit      Cardiovascular and Mediastinum   DVT (deep venous thrombosis) (H[facility])    Chronic, stable on Eliquis.  Had hematology consult last year and able to transition to Eliquis, which he has done without issue.  CBC  And CMP today.      Relevant Orders   CBC with Differential/Platelet   Hypertension    Chronic, stable with BP below goal.  Continue current medication regimen and adjust as needed.  Recommend he monitor BP at least weekly at home and document + focus on DASH diet.  CMP and TSH today.  Return in 6 months.      Relevant Orders   Comprehensive metabolic panel   TSH     Digestive   Gastroesophageal reflux disease without esophagitis    Chronic, stable with Protonix.  Continue current medication regimen and consider reduction in future.  Mag level annually.      Relevant Orders   Magnesium     Other   Hyperlipidemia    Chronic, ongoing.  Continue current medication regimen and adjust as needed.  Lipid panel and CMP today.      Relevant Orders   Lipid Panel w/o Chol/HDL Ratio   SLE (systemic lupus erythematosus) (HCC)    Will continue collaboration with his rheumatologist Dr. Estanislado Pandy.  Continue current medication regimen as prescribed by them.      Relevant Orders   CBC with Differential/Platelet    Comprehensive metabolic panel    Other Visit Diagnoses    Routine general medical examination at a health care facility    -  Primary   Prostate cancer screening       PSA on labs for screening today.   Relevant Orders   PSA      Discussed aspirin prophylaxis for myocardial infarction prevention and decision was it was not indicated  LABORATORY TESTING:  Health maintenance labs ordered today as discussed above.   The natural history of prostate cancer and ongoing controversy regarding screening and potential treatment outcomes of prostate cancer has been discussed with the patient. The meaning of a false positive PSA and a false negative PSA has been discussed. He indicates understanding of the limitations of this screening test and wishes to proceed with screening PSA testing.   IMMUNIZATIONS:   - Tdap: Tetanus vaccination status reviewed: last tetanus booster within 10 years. - Influenza: Up to date - Pneumovax: Not applicable - Prevnar: Not applicable - Zostavax vaccine: Refused  SCREENING: - Colonoscopy: Up to date  Discussed with patient purpose of the colonoscopy is to detect colon cancer at curable precancerous or early stages   - AAA Screening: Not applicable  -Hearing Test: Not applicable  -Spirometry: Not applicable   PATIENT COUNSELING:    Sexuality: Discussed  sexually transmitted diseases, partner selection, use of condoms, avoidance of unintended pregnancy  and contraceptive alternatives.   Advised to avoid cigarette smoking.  I discussed with the patient that most people either abstain from alcohol or drink within safe limits (<=14/week and <=4 drinks/occasion for males, <=7/weeks and <= 3 drinks/occasion for females) and that the risk for alcohol disorders and other health effects rises proportionally with the number of drinks per week and how often a drinker exceeds daily limits.  Discussed cessation/primary prevention of drug use and availability of treatment  for abuse.   Diet: Encouraged to adjust caloric intake to maintain  or achieve ideal body weight, to reduce intake of dietary saturated fat and total fat, to limit sodium intake by avoiding high sodium foods and not adding table salt, and to maintain adequate dietary potassium and calcium preferably from fresh fruits, vegetables, and low-fat dairy products.    stressed the importance of regular exercise  Injury prevention: Discussed safety belts, safety helmets, smoke detector, smoking near bedding or upholstery.   Dental health: Discussed importance of regular tooth brushing, flossing, and dental visits.   Follow up plan: NEXT PREVENTATIVE PHYSICAL DUE IN 1 YEAR. Return in about 6 months (around 02/17/2020) for HTN/HLD, DVT, SLE.

## 2019-08-17 NOTE — Assessment & Plan Note (Signed)
Chronic, ongoing.  Continue current medication regimen and adjust as needed.  Lipid panel and CMP today.    

## 2019-08-18 LAB — COMPREHENSIVE METABOLIC PANEL
ALT: 20 IU/L (ref 0–44)
AST: 25 IU/L (ref 0–40)
Albumin/Globulin Ratio: 1.9 (ref 1.2–2.2)
Albumin: 5.1 g/dL — ABNORMAL HIGH (ref 3.8–4.8)
Alkaline Phosphatase: 72 IU/L (ref 48–121)
BUN/Creatinine Ratio: 21 (ref 10–24)
BUN: 28 mg/dL — ABNORMAL HIGH (ref 8–27)
Bilirubin Total: 0.3 mg/dL (ref 0.0–1.2)
CO2: 18 mmol/L — ABNORMAL LOW (ref 20–29)
Calcium: 10.3 mg/dL — ABNORMAL HIGH (ref 8.6–10.2)
Chloride: 94 mmol/L — ABNORMAL LOW (ref 96–106)
Creatinine, Ser: 1.33 mg/dL — ABNORMAL HIGH (ref 0.76–1.27)
GFR calc Af Amer: 65 mL/min/{1.73_m2} (ref >59)
GFR calc non Af Amer: 56 mL/min/{1.73_m2} — ABNORMAL LOW (ref >59)
Globulin, Total: 2.7 g/dL (ref 1.5–4.5)
Glucose: 97 mg/dL (ref 65–99)
Potassium: 4.7 mmol/L (ref 3.5–5.2)
Sodium: 128 mmol/L — ABNORMAL LOW (ref 134–144)
Total Protein: 7.8 g/dL (ref 6.0–8.5)

## 2019-08-18 LAB — CBC WITH DIFFERENTIAL/PLATELET
Basophils Absolute: 0.1 10*3/uL (ref 0.0–0.2)
Basos: 1 %
EOS (ABSOLUTE): 0.3 10*3/uL (ref 0.0–0.4)
Eos: 6 %
Hematocrit: 41.4 % (ref 37.5–51.0)
Hemoglobin: 14.3 g/dL (ref 13.0–17.7)
Immature Grans (Abs): 0 10*3/uL (ref 0.0–0.1)
Immature Granulocytes: 0 %
Lymphocytes Absolute: 0.9 10*3/uL (ref 0.7–3.1)
Lymphs: 17 %
MCH: 31.6 pg (ref 26.6–33.0)
MCHC: 34.5 g/dL (ref 31.5–35.7)
MCV: 91 fL (ref 79–97)
Monocytes Absolute: 0.8 10*3/uL (ref 0.1–0.9)
Monocytes: 15 %
Neutrophils Absolute: 3.2 10*3/uL (ref 1.4–7.0)
Neutrophils: 61 %
Platelets: 282 10*3/uL (ref 150–450)
RBC: 4.53 x10E6/uL (ref 4.14–5.80)
RDW: 13 % (ref 11.6–15.4)
WBC: 5.2 10*3/uL (ref 3.4–10.8)

## 2019-08-18 LAB — PSA: Prostate Specific Ag, Serum: 0.7 ng/mL (ref 0.0–4.0)

## 2019-08-18 LAB — LIPID PANEL W/O CHOL/HDL RATIO
Cholesterol, Total: 161 mg/dL (ref 100–199)
HDL: 40 mg/dL (ref >39)
LDL Chol Calc (NIH): 90 mg/dL (ref 0–99)
Triglycerides: 177 mg/dL — ABNORMAL HIGH (ref 0–149)
VLDL Cholesterol Cal: 31 mg/dL (ref 5–40)

## 2019-08-18 LAB — MAGNESIUM: Magnesium: 1.7 mg/dL (ref 1.6–2.3)

## 2019-08-18 LAB — TSH: TSH: 1.58 u[IU]/mL (ref 0.450–4.500)

## 2019-08-19 ENCOUNTER — Other Ambulatory Visit: Payer: Self-pay | Admitting: Nurse Practitioner

## 2019-08-19 DIAGNOSIS — E871 Hypo-osmolality and hyponatremia: Secondary | ICD-10-CM

## 2019-08-19 NOTE — Progress Notes (Signed)
Contacted via MyChart  Good evening Dwayne Jacobson your labs have returned: - CBC, prostate, thyroid, magnesium, and cholesterol levels looks good - Kidney function is showing some mild kidney disease which we will continue to monitor and sodium level is a little low at 128.  I would like you to try to add a little more salt to diet to help bring this up, then would like to check on outpatient labs in on week.  I will order labs and you will need to schedule lab visit only.  Calcium level is mildly elevated too and we will continue to monitor this.   Any questions? Keep being awesome!! Kindest regards, Darleen Moffitt

## 2019-09-28 NOTE — Progress Notes (Addendum)
Office Visit Note  Patient: Dwayne Jacobson             Date of Birth: [birth year]           MRN: [id number]             PCP: Venita Lick, NP Referring: Venita Lick, NP Visit Date: 10/12/2019 Occupation: _0 @  Subjective:  Medication monitoring   History of Present Illness: Dwayne Jacobson is a 64 y.o. male with history of systemic lupus erythematosus and osteoarthritis.  Patient is taking Plaquenil 200 mg 1 tablet by mouth daily.  He denies any signs or symptoms of a systemic lupus flare recently.  He is not experiencing any joint pain or joint swelling at this time.  He experiences occasional muscle tension and tenderness in the trapezius muscles.  He states that he has muscle spasms while at work if it is hot.  He takes Flexeril 10 mg 3 times daily as needed for muscle spasms.  He denies any recent rashes and tries to avoid direct sun exposure.  He has intermittent symptoms of Raynaud's. He denies any oral or nasal ulcerations.  He denies any sicca symptoms.  He denies any enlarged lymph nodes.  He has not had any chest pain, palpitations, or shortness of breath.  He continues to take Eliquis as prescribed and notices easy bruising.  Activities of Daily Living:  Patient reports morning stiffness for 0 minutes.   Patient Denies nocturnal pain.  Difficulty dressing/grooming: Denies Difficulty climbing stairs: Denies Difficulty getting out of chair: Denies Difficulty using hands for taps, buttons, cutlery, and/or writing: Denies  Review of Systems  Constitutional: Negative for fatigue.  HENT: Negative for mouth sores, mouth dryness and nose dryness.   Eyes: Negative for itching and dryness.  Respiratory: Negative for shortness of breath and difficulty breathing.   Cardiovascular: Negative for chest pain and palpitations.  Gastrointestinal: Negative for blood in stool, constipation and diarrhea.  Endocrine: Negative for increased urination.  Genitourinary: Negative for  difficulty urinating.  Musculoskeletal: Negative for arthralgias, joint pain, joint swelling, myalgias, morning stiffness, muscle tenderness and myalgias.  Skin: Negative for color change, rash and redness.  Allergic/Immunologic: Negative for susceptible to infections.  Neurological: Negative for dizziness, numbness, headaches, memory loss and weakness.  Hematological: Positive for bruising/bleeding tendency.  Psychiatric/Behavioral: Negative for confusion.    PMFS History:  Patient Active Problem List   Diagnosis Date Noted  . Gastroesophageal reflux disease without esophagitis 08/17/2019  . Traumatic rupture of biceps tendon 07/25/2018  . Chondrocalcinosis 01/13/2017  . Primary osteoarthritis of both knees 01/13/2017  . DVT (deep venous thrombosis) ([place])   . Hypertension   . Hyperlipidemia   . SLE (systemic lupus erythematosus) ([place]) 01/18/2014    Past Medical History:  Diagnosis Date  . DVT (deep venous thrombosis) ([place])   . Hyperlipidemia   . Hypertension   . Seizures ([place])   . Skin cancer   . Systemic lupus erythematosus ([facility])     Family History  Problem Relation Age of Onset  . Diabetes Mother   . Hypertension Mother   . Hyperlipidemia Mother   . Stroke Mother   . Lung disease Mother   . Heart disease Mother   . Heart disease Father   . Lung disease Father   . Diabetes Sister   . Colon polyps Maternal Grandmother    Past Surgical History:  Procedure Laterality Date  . NO PAST SURGERIES     Social History  Social History Narrative  . Not on file   Immunization History  Administered Date(s) Administered  . Influenza,inj,Quad PF,6+ Mos 01/03/2015, 11/05/2016, 11/07/2018  . Pneumococcal Polysaccharide-23 09/29/2012  . Td 03/25/2006, 08/06/2016     Objective: Vital Signs: BP 129/78 (BP Location: Left Arm, Patient Position: Sitting, Cuff Size: Normal)   Pulse (!) 51   Resp 16   Ht _0  (1.803 m)   Wt 198 lb (89.8 kg)   BMI 27.62 kg/m    Physical  Exam Vitals and nursing note reviewed.  Constitutional:      Appearance: He is well-developed.  HENT:     Head: Normocephalic and atraumatic.  Eyes:     Conjunctiva/sclera: Conjunctivae normal.     Pupils: Pupils are equal, round, and reactive to light.  Pulmonary:     Effort: Pulmonary effort is normal.  Abdominal:     Palpations: Abdomen is soft.  Musculoskeletal:     Cervical back: Normal range of motion and neck supple.  Skin:    General: Skin is warm and dry.     Capillary Refill: Capillary refill takes less than 2 seconds.  Neurological:     Mental Status: He is alert and oriented to person, place, and time.  Psychiatric:        Behavior: Behavior normal.      Musculoskeletal Exam: C-spine slightly limited ROM with lateral rotation.  Thoracic and lumbar spine good ROM.  No midline spinal tenderness.  No SI joint tenderness.  Shoulder joints, elbow joints, wrist joints, MCPs, PIPs, and DIPs good ROM with no synovitis.  Complete fist formation bilaterally.  PIP and DIP thickening consistent with osteoarthritis of both hands.  Hip joints good ROM with no discomfort.  Knee joints good ROM with no warmth or effusion.  Ankle joints good ROM with no tenderness or inflammation.   CDAI Exam: CDAI Score: -- Patient Global: --; Provider Global: -- Swollen: --; Tender: -- Joint Exam 10/12/2019   No joint exam has been documented for this visit   There is currently no information documented on the homunculus. Go to the Rheumatology activity and complete the homunculus joint exam.  Investigation: No additional findings.  Imaging: No results found.  Recent Labs: Lab Results  Component Value Date   WBC 5.2 08/17/2019   HGB 14.3 08/17/2019   PLT 282 08/17/2019   NA 128 (L) 08/17/2019   K 4.7 08/17/2019   CL 94 (L) 08/17/2019   CO2 18 (L) 08/17/2019   GLUCOSE 97 08/17/2019   BUN 28 (H) 08/17/2019   CREATININE 1.33 (H) 08/17/2019   BILITOT 0.3 08/17/2019   ALKPHOS 72  08/17/2019   AST 25 08/17/2019   ALT 20 08/17/2019   PROT 7.8 08/17/2019   ALBUMIN 5.1 (H) 08/17/2019   CALCIUM 10.3 (H) 08/17/2019   GFRAA 65 08/17/2019    Speciality Comments: PLQ Eye Exam:08/07/2019 WNL @ [place] [facility] Follow up in 1 year  Procedures:  No procedures performed Allergies: Patient has no known allergies.   Assessment / Plan:     Visit Diagnoses: Other systemic lupus erythematosus with other organ involvement ([place]) - Positive dsDNA, positive Ro. History of rash, diagnosed with lupus in the past: He has not had any signs or symptoms of a systemic lupus flare.  He is clinically doing well on Plaquenil 200 mg 1 tablet by mouth daily.  He is tolerating Plaquenil and has not missed any doses recently.  He has not had any recent rashes but has  ongoing photosensitivity.  We discussed the importance of avoiding direct sun exposure and wearing sunscreen SPF greater than 50 on a daily basis.  He has not had any recent oral or nasal ulcerations and denies sicca symptoms.  He has occasional symptoms of Raynaud's but does not have any digital ulcerations or signs of gangrene.  He denies any SOB, chest pain, or palpitations.  Lab work from 05/18/2019 was reviewed with the patient today in the office: ESR within normal limits, complements within normal limits, double-stranded DNA was 9.  We will recheck the following autoimmune labs today.  He will continue taking Plaquenil as prescribed by his dermatologist, Dr. Kirkland Hun.  He was advised to notify us if he develops any new or worsening symptoms. He will follow-up in the office in 5 months.- Plan: CBC with Differential/Platelet, COMPLETE METABOLIC PANEL WITH GFR, Urinalysis, Routine w reflex microscopic, Anti-DNA antibody, double-stranded, C3 and C4, Sedimentation rate  High risk medication use - Plaquenil 200 mg 1 tablet by mouth daily-prescribed by Dr. Kirkland Hun (dermatologist). PLQ Eye Exam:08/07/2019 WNL @ [facility] Follow up  in 1 year.  CBC and CMP were drawn on 08/17/2019.  CBC was within normal limits.  Creatinine was 1.33 and GFR was 56 at that time.  His GFR dropped from 92 to 56 when compared to his lab work from 05/18/19.  We will recheck CBC and CMP today.- Plan: CBC with Differential/Platelet, COMPLETE METABOLIC PANEL WITH GFR He has not received the COVID-19 vaccination but was strongly encouraged to.  He was encouraged to continue to wear a mask and social distance.  He was advised to notify us if he develops a COVID-19 infection in order to receive the antibody infusion.  He voiced understanding.  Primary osteoarthritis of both knees: He has good range of motion of both knee joints on exam with no discomfort.  No warmth or effusion was noted.  He has not been experiencing any knee joint pain recently.  He has no difficulty climbing steps or rising from a seated position.  Chondrocalcinosis: He has not had any recent flares.  He has no joint pain or inflammation on exam.  Trapezius muscle spasm: He experiences intermittent trapezius muscle tension and tenderness.  He has spasms while at work if it is hot.  He takes Flexeril 10 mg 3 times daily as needed for muscle spasms.  Essential hypertension: Blood pressure was 129/78 today.  History of DVT (deep vein thrombosis): He is on long-term Eliquis.  Mixed hyperlipidemia  Orders: Orders Placed This Encounter  Procedures  . CBC with Differential/Platelet  . COMPLETE METABOLIC PANEL WITH GFR  . Urinalysis, Routine w reflex microscopic  . Anti-DNA antibody, double-stranded  . C3 and C4  . Sedimentation rate   No orders of the defined types were placed in this encounter.     Follow-Up Instructions: Return in about 5 months (around 03/13/2020) for Systemic lupus erythematosus, Osteoarthritis.   Ofilia Neas, PA-C  Note - This record has been created using Dragon software.  Chart creation errors have been sought, but may not always  have been located.  Such creation errors do not reflect on  the standard of medical care.

## 2019-10-12 ENCOUNTER — Other Ambulatory Visit: Payer: Self-pay

## 2019-10-12 ENCOUNTER — Ambulatory Visit (INDEPENDENT_AMBULATORY_CARE_PROVIDER_SITE_OTHER): Payer: No Typology Code available for payment source | Admitting: Physician Assistant

## 2019-10-12 ENCOUNTER — Encounter: Payer: Self-pay | Admitting: Physician Assistant

## 2019-10-12 VITALS — BP 129/78 | HR 51 | Resp 16 | Ht 71.0 in | Wt 198.0 lb

## 2019-10-12 DIAGNOSIS — E782 Mixed hyperlipidemia: Secondary | ICD-10-CM

## 2019-10-12 DIAGNOSIS — M1120 Other chondrocalcinosis, unspecified site: Secondary | ICD-10-CM

## 2019-10-12 DIAGNOSIS — Z86718 Personal history of other venous thrombosis and embolism: Secondary | ICD-10-CM

## 2019-10-12 DIAGNOSIS — M62838 Other muscle spasm: Secondary | ICD-10-CM

## 2019-10-12 DIAGNOSIS — M3219 Other organ or system involvement in systemic lupus erythematosus: Secondary | ICD-10-CM

## 2019-10-12 DIAGNOSIS — M170 Bilateral primary osteoarthritis of knee: Secondary | ICD-10-CM

## 2019-10-12 DIAGNOSIS — Z79899 Other long term (current) drug therapy: Secondary | ICD-10-CM | POA: Diagnosis not present

## 2019-10-12 DIAGNOSIS — I10 Essential (primary) hypertension: Secondary | ICD-10-CM

## 2019-10-12 NOTE — Patient Instructions (Signed)
COVID-19 vaccine recommendations:   COVID-19 vaccine is recommended for everyone (unless you are allergic to a vaccine component), even if you are on a medication that suppresses your immune system.   If you are on Methotrexate, Cellcept (mycophenolate), Rinvoq, Morrie Sheldon, and Olumiant- hold the medication for 1 week after each vaccine. Hold Methotrexate for 2 weeks after the single dose COVID-19 vaccine.   If you are on Orencia subcutaneous injection - hold medication one week prior to and one week after the first COVID-19 vaccine dose (only).   If you are on Orencia IV infusions- time vaccination administration so that the first COVID-19 vaccination will occur four weeks after the infusion and postpone the subsequent infusion by one week.   If you are on Cyclophosphamide or Rituxan infusions please contact your doctor prior to receiving the COVID-19 vaccine.   Do not take Tylenol or any anti-inflammatory medications (NSAIDs) 24 hours prior to the COVID-19 vaccination.   There is no direct evidence about the efficacy of the COVID-19 vaccine in individuals who are on medications that suppress the immune system.   Even if you are fully vaccinated, and you are on any medications that suppress your immune system, please continue to wear a mask, maintain at least six feet social distance and practice hand hygiene.   If you develop a COVID-19 infection, please contact your PCP or our office to determine if you need antibody infusion.  The booster vaccine is now available for immunocompromised patients. It is advised that if you had Pfizer vaccine you should get [employer] booster.  If you had a Moderna vaccine then you should get a Moderna booster. Johnson and Wynetta Emery does not have a booster vaccine at this time.  Please see the following web sites for updated information.    https://www.rheumatology.org/Portals/0/Files/COVID-19-Vaccination-Patient-Resources.pdf  https://www.rheumatology.org/About-Us/Newsroom/Press-Releases/ID/1159   Neck Exercises Ask your health care provider which exercises are safe for you. Do exercises exactly as told by your health care provider and adjust them as directed. It is normal to feel mild stretching, pulling, tightness, or discomfort as you do these exercises. Stop right away if you feel sudden pain or your pain gets worse. Do not begin these exercises until told by your health care provider. Neck exercises can be important for many reasons. They can improve strength and maintain flexibility in your neck, which will help your upper back and prevent neck pain. Stretching exercises Rotation neck stretching  1. Sit in a chair or stand up. 2. Place your feet flat on the floor, shoulder width apart. 3. Slowly turn your head (rotate) to the right until a slight stretch is felt. Turn it all the way to the right so you can look over your right shoulder. Do not tilt or tip your head. 4. Hold this position for 10-30 seconds. 5. Slowly turn your head (rotate) to the left until a slight stretch is felt. Turn it all the way to the left so you can look over your left shoulder. Do not tilt or tip your head. 6. Hold this position for 10-30 seconds. Repeat __________ times. Complete this exercise __________ times a day. Neck retraction 1. Sit in a sturdy chair or stand up. 2. Look straight ahead. Do not bend your neck. 3. Use your fingers to push your chin backward (retraction). Do not bend your neck for this movement. Continue to face straight ahead. If you are doing the exercise properly, you will feel a slight sensation in your throat and a stretch at the back of  your neck. 4. Hold the stretch for 1-2 seconds. Repeat __________ times. Complete this exercise __________ times a day. Strengthening exercises Neck press 1. Lie on your back on  a firm bed or on the floor with a pillow under your head. 2. Use your neck muscles to push your head down on the pillow and straighten your spine. 3. Hold the position as well as you can. Keep your head facing up (in a neutral position) and your chin tucked. 4. Slowly count to 5 while holding this position. Repeat __________ times. Complete this exercise __________ times a day. Isometrics These are exercises in which you strengthen the muscles in your neck while keeping your neck still (isometrics). 1. Sit in a supportive chair and place your hand on your forehead. 2. Keep your head and face facing straight ahead. Do not flex or extend your neck while doing isometrics. 3. Push forward with your head and neck while pushing back with your hand. Hold for 10 seconds. 4. Do the sequence again, this time putting your hand against the back of your head. Use your head and neck to push backward against the hand pressure. 5. Finally, do the same exercise on either side of your head, pushing sideways against the pressure of your hand. Repeat __________ times. Complete this exercise __________ times a day. Prone head lifts 1. Lie face-down (prone position), resting on your elbows so that your chest and upper back are raised. 2. Start with your head facing downward, near your chest. Position your chin either on or near your chest. 3. Slowly lift your head upward. Lift until you are looking straight ahead. Then continue lifting your head as far back as you can comfortably stretch. 4. Hold your head up for 5 seconds. Then slowly lower it to your starting position. Repeat __________ times. Complete this exercise __________ times a day. Supine head lifts 1. Lie on your back (supine position), bending your knees to point to the ceiling and keeping your feet flat on the floor. 2. Lift your head slowly off the floor, raising your chin toward your chest. 3. Hold for 5 seconds. Repeat __________ times. Complete  this exercise __________ times a day. Scapular retraction 1. Stand with your arms at your sides. Look straight ahead. 2. Slowly pull both shoulders (scapulae) backward and downward (retraction) until you feel a stretch between your shoulder blades in your upper back. 3. Hold for 10-30 seconds. 4. Relax and repeat. Repeat __________ times. Complete this exercise __________ times a day. Contact a health care provider if:  Your neck pain or discomfort gets much worse when you do an exercise.  Your neck pain or discomfort does not improve within 2 hours after you exercise. If you have any of these problems, stop exercising right away. Do not do the exercises again unless your health care provider says that you can. Get help right away if:  You develop sudden, severe neck pain. If this happens, stop exercising right away. Do not do the exercises again unless your health care provider says that you can. This information is not intended to replace advice given to you by your health care provider. Make sure you discuss any questions you have with your health care provider. Document Revised: 11/16/2017 Document Reviewed: 11/16/2017 Elsevier Patient Education  [place].

## 2019-10-15 NOTE — Progress Notes (Signed)
DsDNA is positive but stable-8.  Complements pending.

## 2019-10-15 NOTE — Progress Notes (Signed)
CBC and CMP WNL.  ESR WNL.  UA normal.

## 2019-10-16 LAB — C3 AND C4
C3 Complement: 125 mg/dL (ref 82–185)
C4 Complement: 30 mg/dL (ref 15–53)

## 2019-10-16 LAB — URINALYSIS, ROUTINE W REFLEX MICROSCOPIC
Bilirubin Urine: NEGATIVE
Glucose, UA: NEGATIVE
Hgb urine dipstick: NEGATIVE
Ketones, ur: NEGATIVE
Leukocytes,Ua: NEGATIVE
Nitrite: NEGATIVE
Protein, ur: NEGATIVE
Specific Gravity, Urine: 1.011 (ref 1.001–1.03)
pH: 7 (ref 5.0–8.0)

## 2019-10-16 LAB — COMPLETE METABOLIC PANEL WITH GFR
AG Ratio: 1.7 (calc) (ref 1.0–2.5)
ALT: 27 U/L (ref 9–46)
AST: 26 U/L (ref 10–35)
Albumin: 4.6 g/dL (ref 3.6–5.1)
Alkaline phosphatase (APISO): 58 U/L (ref 35–144)
BUN: 9 mg/dL (ref 7–25)
CO2: 27 mmol/L (ref 20–32)
Calcium: 9.7 mg/dL (ref 8.6–10.3)
Chloride: 99 mmol/L (ref 98–110)
Creat: 0.89 mg/dL (ref 0.70–1.25)
GFR, Est African American: 105 mL/min/{1.73_m2} (ref >=60)
GFR, Est Non African American: 90 mL/min/{1.73_m2} (ref >=60)
Globulin: 2.7 g/dL (calc) (ref 1.9–3.7)
Glucose, Bld: 100 mg/dL — ABNORMAL HIGH (ref 65–99)
Potassium: 4.1 mmol/L (ref 3.5–5.3)
Sodium: 136 mmol/L (ref 135–146)
Total Bilirubin: 0.4 mg/dL (ref 0.2–1.2)
Total Protein: 7.3 g/dL (ref 6.1–8.1)

## 2019-10-16 LAB — CBC WITH DIFFERENTIAL/PLATELET
Absolute Monocytes: 667 cells/uL (ref 200–950)
Basophils Absolute: 52 cells/uL (ref 0–200)
Basophils Relative: 1.2 %
Eosinophils Absolute: 241 cells/uL (ref 15–500)
Eosinophils Relative: 5.6 %
HCT: 40.3 % (ref 38.5–50.0)
Hemoglobin: 13.4 g/dL (ref 13.2–17.1)
Lymphs Abs: 907 cells/uL (ref 850–3900)
MCH: 31.4 pg (ref 27.0–33.0)
MCHC: 33.3 g/dL (ref 32.0–36.0)
MCV: 94.4 fL (ref 80.0–100.0)
MPV: 9.9 fL (ref 7.5–12.5)
Monocytes Relative: 15.5 %
Neutro Abs: 2434 cells/uL (ref 1500–7800)
Neutrophils Relative %: 56.6 %
Platelets: 284 10*3/uL (ref 140–400)
RBC: 4.27 10*6/uL (ref 4.20–5.80)
RDW: 13.6 % (ref 11.0–15.0)
Total Lymphocyte: 21.1 %
WBC: 4.3 10*3/uL (ref 3.8–10.8)

## 2019-10-16 LAB — ANTI-DNA ANTIBODY, DOUBLE-STRANDED: ds DNA Ab: 8 IU/mL — ABNORMAL HIGH

## 2019-10-16 LAB — SEDIMENTATION RATE: Sed Rate: 9 mm/h (ref 0–20)

## 2019-10-16 NOTE — Progress Notes (Signed)
Complements WNL

## 2020-01-24 ENCOUNTER — Other Ambulatory Visit: Payer: Self-pay | Admitting: Nurse Practitioner

## 2020-01-24 MED ORDER — APIXABAN 5 MG PO TABS
5.0000 mg | ORAL_TABLET | Freq: Two times a day (BID) | ORAL | 0 refills | Status: DC
Start: 2020-01-24 — End: 2020-02-08

## 2020-01-24 NOTE — Telephone Encounter (Signed)
Medication Refill - Medication: apixaban (ELIQUIS) 5 MG TABS tablet     Preferred Pharmacy (with phone number or street name): [id number] - [place], [place] - [address] Phone:  [phone]  Fax:  [phone]       Agent: Please be advised that RX refills may take up to 3 business days. We ask that you follow-up with your pharmacy.

## 2020-02-08 ENCOUNTER — Telehealth: Payer: Self-pay | Admitting: Nurse Practitioner

## 2020-02-08 ENCOUNTER — Other Ambulatory Visit: Payer: Self-pay | Admitting: Nurse Practitioner

## 2020-02-08 NOTE — Telephone Encounter (Signed)
Pts wife calling stating that the pts eliquis is not being covered by insurance. She states that the pt is needing to have a PA or to have the medication coded correctly to have the insurance cover it. Please advise.     [id number] - [place], [place] - [address]  [address] [address]  Phone: [phone] Fax: [phone]  Hours: Not open 24 hours

## 2020-02-08 NOTE — Telephone Encounter (Signed)
   Notes to clinic:   Alternative Requested:NOT COVERED BY INSURANCE.  Requested Prescriptions  Pending Prescriptions Disp Refills   XARELTO 20 MG TABS tablet [Pharmacy Med Name: XARELTO 20 MG TABLET]  0      Hematology: Anticoagulants - rivaroxaban Passed - 02/08/2020 11:18 AM      Passed - ALT in normal range and within 180 days    ALT  Date Value Ref Range Status  10/12/2019 27 9 - 46 U/L Final          Passed - AST in normal range and within 180 days    AST  Date Value Ref Range Status  10/12/2019 26 10 - 35 U/L Final          Passed - Cr in normal range and within 360 days    Creat  Date Value Ref Range Status  10/12/2019 0.89 0.70 - 1.25 mg/dL Final    Comment:    For patients >37 years of age, the reference limit for Creatinine is approximately 13% higher for people identified as African-American. .           Passed - HCT in normal range and within 360 days    HCT  Date Value Ref Range Status  10/12/2019 40.3 38.5 - 50.0 % Final   Hematocrit  Date Value Ref Range Status  08/17/2019 41.4 37.5 - 51.0 % Final          Passed - HGB in normal range and within 360 days    Hemoglobin  Date Value Ref Range Status  10/12/2019 13.4 13.2 - 17.1 g/dL Final  08/17/2019 14.3 13.0 - 17.7 g/dL Final          Passed - PLT in normal range and within 360 days    Platelets  Date Value Ref Range Status  10/12/2019 284 140 - 400 Thousand/uL Final  08/17/2019 282 150 - 450 x10E3/uL Final          Passed - Valid encounter within last 12 months    Recent Outpatient Visits           5 months ago Routine general medical examination at a health care facility   [facility], [place] T, NP   11 months ago 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, unspecified laterality ([place])   [place], Jolene T, NP   1 year ago Deep vein thrombosis (DVT) of femoral vein, unspecified chronicity, unspecified laterality [facility])    [place], [place], PA-C   1 year ago Deep vein thrombosis (DVT) of femoral vein, unspecified chronicity, unspecified laterality ([place])   [place], Henrine Scre[place] T, NP   1 year ago Other systemic lupus erythematosus with other organ involvement [facility])   [place], Jolene T, NP       Future Appointments             In 2 weeks Venita Lick, NP [employer], [place]   In 1 month [place], Blinda [place] [facility]

## 2020-02-08 NOTE — Telephone Encounter (Signed)
Pharmacy sent me note for switch to another medication in same category that is covered, Xarelto, have sent this in.  It is once daily and not twice daily dosing, but same medication category as Eliquis.  If issues obtaining this let us know, but pharmacy reports this should be covered.  Thank you.

## 2020-02-08 NOTE — Telephone Encounter (Signed)
Patient aware.

## 2020-02-17 ENCOUNTER — Other Ambulatory Visit: Payer: Self-pay | Admitting: Physician Assistant

## 2020-02-17 DIAGNOSIS — U071 COVID-19: Secondary | ICD-10-CM

## 2020-02-17 DIAGNOSIS — Z86718 Personal history of other venous thrombosis and embolism: Secondary | ICD-10-CM

## 2020-02-17 DIAGNOSIS — M3219 Other organ or system involvement in systemic lupus erythematosus: Secondary | ICD-10-CM

## 2020-02-17 NOTE — Progress Notes (Signed)
Patient and his wife  Dwayne Jacobson) called on-call service at 2:21 pm today.  Patient started to develop body aches and a headache yesterday.  He took an at-home covid test today, which was positive. He has not had a fever or shortness of breath.  His wife is also symptomatic with a sore throat.    He has not received any of the covid-19 vaccinations.   He has a personal medical history of systemic lupus erythematosus and DVT.  He is on long term plaquenil.  He was advised to continue to take plaquenil as prescribed.   Urgent referral for covid-19 treatment was placed. Discussed the national shortage of receiving the monoclonal antibody infusion.   Also discussed that he is higher risk due to history of SLE, and he was encouraged to monitor his symptoms closely.  He was advised to treat symptoms with OTC tylenol.  Advised the patient to be evaluated in the ED if he develops any shortness of breath or difficulty breathing.   All questions were addressed.    Hazel Sams, PA-C

## 2020-02-18 ENCOUNTER — Other Ambulatory Visit (HOSPITAL_COMMUNITY): Payer: Self-pay | Admitting: Physician Assistant

## 2020-02-18 ENCOUNTER — Encounter: Payer: Self-pay | Admitting: Physician Assistant

## 2020-02-19 ENCOUNTER — Telehealth: Payer: Self-pay

## 2020-02-19 ENCOUNTER — Ambulatory Visit (HOSPITAL_COMMUNITY)
Admission: RE | Admit: 2020-02-19 | Discharge: 2020-02-19 | Disposition: A | Discharge status: Home / Self Care | Payer: No Typology Code available for payment source | Source: Ambulatory Visit | Attending: Pulmonary Disease | Admitting: Pulmonary Disease

## 2020-02-19 ENCOUNTER — Other Ambulatory Visit (HOSPITAL_COMMUNITY): Payer: Self-pay

## 2020-02-19 DIAGNOSIS — D849 Immunodeficiency, unspecified: Secondary | ICD-10-CM | POA: Diagnosis not present

## 2020-02-19 DIAGNOSIS — U071 COVID-19: Secondary | ICD-10-CM | POA: Diagnosis not present

## 2020-02-19 MED ORDER — DIPHENHYDRAMINE HCL 50 MG/ML IJ SOLN: 50.0000 mg | Freq: Once | INTRAMUSCULAR | Status: DC | PRN

## 2020-02-19 MED ORDER — FAMOTIDINE IN NACL 20-0.9 MG/50ML-% IV SOLN: 20.0000 mg | Freq: Once | INTRAVENOUS | Status: DC | PRN

## 2020-02-19 MED ORDER — SODIUM CHLORIDE 0.9 % IV SOLN: INTRAVENOUS | Status: DC | PRN

## 2020-02-19 MED ORDER — SOTROVIMAB 500 MG/8ML IV SOLN
500.0000 mg | Freq: Once | INTRAVENOUS | Status: AC
  Administered 2020-02-19: 500 mg via INTRAVENOUS

## 2020-02-19 MED ORDER — EPINEPHRINE 0.3 MG/0.3ML IJ SOAJ: 0.3000 mg | Freq: Once | INTRAMUSCULAR | Status: DC | PRN

## 2020-02-19 MED ORDER — METHYLPREDNISOLONE SODIUM SUCC 125 MG IJ SOLR: 125.0000 mg | Freq: Once | INTRAMUSCULAR | Status: DC | PRN

## 2020-02-19 MED ORDER — ALBUTEROL SULFATE HFA 108 (90 BASE) MCG/ACT IN AERS: 2.0000 | INHALATION_SPRAY | Freq: Once | RESPIRATORY_TRACT | Status: DC | PRN

## 2020-02-19 NOTE — Telephone Encounter (Signed)
Called pt in regards to nurse note from after hours, stated pt had body aches and headaches and sore throat , Unable to lvm to schedule  Virtual apt for this.

## 2020-02-19 NOTE — Progress Notes (Signed)
Patient reviewed Fact Sheet for Patients, Parents, and Caregivers for Emergency Use Authorization (EUA) of sotrovimab for the Treatment of Coronavirus. Patient also reviewed and is agreeable to the estimated cost of treatment. Patient is agreeable to proceed.   

## 2020-02-19 NOTE — Progress Notes (Signed)
  Diagnosis: COVID-19  Physician: Dr Wright  Procedure: Covid Infusion Clinic Med: bamlanivimab\etesevimab infusion - Provided patient with bamlanimivab\etesevimab fact sheet for patients, parents and caregivers prior to infusion.  Complications: No immediate complications noted.  Discharge: Discharged home   Zebulin Siegel L 02/19/2020  

## 2020-02-19 NOTE — Discharge Instructions (Signed)
What types of side effects do monoclonal antibody drugs cause?  °Common side effects ° °In general, the more common side effects caused by monoclonal antibody drugs include: °• Allergic reactions, such as hives or itching °• Flu-like signs and symptoms, including chills, fatigue, fever, and muscle aches and pains °• Nausea, vomiting °• Diarrhea °• Skin rashes °• Low blood pressure ° ° °The CDC is recommending patients who receive monoclonal antibody treatments wait at least 90 days before being vaccinated. ° °Currently, there are no data on the safety and efficacy of mRNA COVID-19 vaccines in persons who received monoclonal antibodies or convalescent plasma as part of COVID-19 treatment. Based on the estimated half-life of such therapies as well as evidence suggesting that reinfection is uncommon in the 90 days after initial infection, vaccination should be deferred for at least 90 days, as a precautionary measure until additional information becomes available, to avoid interference of the antibody treatment with vaccine-induced immune responses. ° °If you have any questions or concerns after the infusion please call the Advanced Practice Provider on call at [phone]. This number is ONLY intended for your use regarding questions or concerns about the infusion post-treatment side-effects.  Please do not provide this number to others for use. For return to work notes please contact your primary care provider.  ° °If someone you know is interested in receiving treatment please have them contact their MD for a referral or visit [email or web address] ° °  °

## 2020-02-22 ENCOUNTER — Ambulatory Visit: Payer: No Typology Code available for payment source | Admitting: Nurse Practitioner

## 2020-02-29 NOTE — Progress Notes (Signed)
Office Visit Note  Patient: Dwayne Jacobson             Date of Birth: 08-20-1955           MRN: [id number]             PCP: Venita Lick, NP Referring: Venita Lick, NP Visit Date: 03/14/2020 Occupation: '@GUAROCC' @  Subjective:  Medication monitoring   History of Present Illness: Dwayne Jacobson is a 65 y.o. male with history of systemic lupus erythematosus.  He is taking Plaquenil 200 mg 1 tablet by mouth daily.  He continues to tolerate Plaquenil without any side effects and has not missed any doses recently.  He denies any signs or symptoms of a systemic lupus flare.  He is not experiencing any joint pain, joint swelling, morning stiffness.  He has not had any recent rashes or increased photosensitivity.  He denies any oral or nasal ulcerations.  He has mild eye dryness but no mouth dryness.  He denies any swollen lymph nodes.  He has intermittent symptoms of Raynaud's but denies any digital ulcerations.  He denies any shortness of breath, chest pain, or palpitations.  He reports that he was recently started on Xarelto which she has been tolerating. He was diagnosed with covid-19 in 02/17/20 and received the monoclonal antibody infusion on 02/19/20.  His symptoms have completely resolved.  He is not having any residual fatigue.       Activities of Daily Living:  Patient reports morning stiffness for 0 minutes.   Patient Denies nocturnal pain.  Difficulty dressing/grooming: Denies Difficulty climbing stairs: Denies Difficulty getting out of chair: Denies Difficulty using hands for taps, buttons, cutlery, and/or writing: Denies  Review of Systems  Constitutional: Negative for fatigue and night sweats.  HENT: Negative for mouth sores, mouth dryness and nose dryness.   Eyes: Positive for dryness. Negative for redness.  Respiratory: Negative for shortness of breath and difficulty breathing.   Cardiovascular: Negative for chest pain, palpitations, hypertension, irregular heartbeat  and swelling in legs/feet.  Gastrointestinal: Negative for blood in stool, constipation and diarrhea.  Endocrine: Negative for increased urination.  Genitourinary: Negative for painful urination.  Musculoskeletal: Negative for arthralgias, joint pain, joint swelling, myalgias, muscle weakness, morning stiffness, muscle tenderness and myalgias.  Skin: Negative for color change, rash, hair loss, nodules/bumps, skin tightness, ulcers and sensitivity to sunlight.  Allergic/Immunologic: Negative for susceptible to infections.  Neurological: Negative for dizziness, fainting, memory loss, night sweats and weakness.  Hematological: Negative for swollen glands.  Psychiatric/Behavioral: Negative for depressed mood and sleep disturbance. The patient is not nervous/anxious.     PMFS History:  Patient Active Problem List   Diagnosis Date Noted  . Gastroesophageal reflux disease without esophagitis 08/17/2019  . Traumatic rupture of biceps tendon 07/25/2018  . Chondrocalcinosis 01/13/2017  . Primary osteoarthritis of both knees 01/13/2017  . DVT (deep venous thrombosis) ([place])   . Hypertension   . Hyperlipidemia   . SLE (systemic lupus erythematosus) ([place]) 01/18/2014    Past Medical History:  Diagnosis Date  . DVT (deep venous thrombosis) ([place])   . Hyperlipidemia   . Hypertension   . Seizures ([place])   . Skin cancer   . Systemic lupus erythematosus (HCC)     Family History  Problem Relation Age of Onset  . Diabetes Mother   . Hypertension Mother   . Hyperlipidemia Mother   . Stroke Mother   . Lung disease Mother   . Heart disease Mother   .  Heart disease Father   . Lung disease Father   . Diabetes Sister   . Colon polyps Maternal Grandmother    Past Surgical History:  Procedure Laterality Date  . NO PAST SURGERIES     Social History   Social History Narrative  . Not on file   Immunization History  Administered Date(s) Administered  . Influenza,inj,Quad PF,6+ Mos 01/03/2015,  11/05/2016, 11/07/2018  . Pneumococcal Polysaccharide-23 09/29/2012  . Td 03/25/2006, 08/06/2016     Objective: Vital Signs: BP 120/74 (BP Location: Right Arm, Patient Position: Sitting, Cuff Size: Large)   Pulse 61   Resp 12   Ht '5\' 11"'  (1.803 m)   Wt 197 lb (89.4 kg)   BMI 27.48 kg/m    Physical Exam Vitals and nursing note reviewed.  Constitutional:      Appearance: He is well-developed and well-nourished.  HENT:     Head: Normocephalic and atraumatic.  Eyes:     Extraocular Movements: EOM normal.     Conjunctiva/sclera: Conjunctivae normal.     Pupils: Pupils are equal, round, and reactive to light.  Pulmonary:     Effort: Pulmonary effort is normal.  Abdominal:     Palpations: Abdomen is soft.  Musculoskeletal:     Cervical back: Normal range of motion and neck supple.  Skin:    General: Skin is warm and dry.     Capillary Refill: Capillary refill takes less than 2 seconds.  Neurological:     Mental Status: He is alert and oriented to person, place, and time.  Psychiatric:        Mood and Affect: Mood and affect normal.        Behavior: Behavior normal.      Musculoskeletal Exam: C-spine has limited range of motion with lateral rotation.  Thoracic and lumbar spine have good range of motion.  No midline spinal tenderness.  No SI joint tenderness.  Shoulder joints, elbow joints, wrist joints, MCPs, PIPs, DIPs have good range of motion with no synovitis.  He has PIP and DIP thickening consistent with osteoarthritis of both hands.  He is able to make a complete fist bilaterally.  Hip joints have good range of motion with no discomfort.  Knee joints have good range of motion with no warmth or effusion.  Ankle joints have good range of motion with no tenderness or inflammation.  CDAI Exam: CDAI Score: - Patient Global: -; Provider Global: - Swollen: -; Tender: - Joint Exam 03/14/2020   No joint exam has been documented for this visit   There is currently no  information documented on the homunculus. Go to the Rheumatology activity and complete the homunculus joint exam.  Investigation: No additional findings.  Imaging: No results found.  Recent Labs: Lab Results  Component Value Date   WBC 4.3 10/12/2019   HGB 13.4 10/12/2019   PLT 284 10/12/2019   NA 136 10/12/2019   K 4.1 10/12/2019   CL 99 10/12/2019   CO2 27 10/12/2019   GLUCOSE 100 (H) 10/12/2019   BUN 9 10/12/2019   CREATININE 0.89 10/12/2019   BILITOT 0.4 10/12/2019   ALKPHOS 72 08/17/2019   AST 26 10/12/2019   ALT 27 10/12/2019   PROT 7.3 10/12/2019   ALBUMIN 5.1 (H) 08/17/2019   CALCIUM 9.7 10/12/2019   GFRAA 105 10/12/2019    Speciality Comments: PLQ Eye Exam:08/07/2019 WNL @ [place] [facility] Follow up in 1 year  Procedures:  No procedures performed Allergies: Patient has no known allergies.  Assessment / Plan:     Visit Diagnoses: Other systemic lupus erythematosus with other organ involvement ([place]) - Positive dsDNA, positive Ro. History of rash, diagnosed with lupus in the past: He has not had any signs or symptoms of a systemic lupus flare.  He is clinically doing well taking Plaquenil 200 mg 1 tablet by mouth daily.  He continues to tolerate Plaquenil without any side effects and has not missed any doses recently.  He has not had any recent rashes, increased photosensitivity, oral or nasal ulcerations, mouth dryness, swollen lymph nodes, chest pain, palpitations, or shortness of breath.  He experiences intermittent symptoms of Raynaud's but no digital stations or signs of gangrene were noted on examination today.  We discussed the importance of avoiding direct sun exposure and wearing sunscreen SPF greater than 50 on a daily basis.  He is not experiencing any joint pain, joint swelling, or morning stiffness.  He has no difficulty with ADLs.  He has no synovitis on examination today. Lab work from 10/12/2019 was reviewed with the patient today in the office:  Double-stranded DNA 8, complements within normal limits, ESR within normal limits, UA normal, CBC within normal limits.  We will repeat the following lab work today.  He will continue taking Plaquenil as prescribed.  He does not need any refills at this time.  He was advised to notify us if he develops any new or worsening symptoms.  He will follow-up in the office in 5 months.  High risk medication use - Plaquenil 200 mg 1 tablet by mouth daily-prescribed by Dr. Kirkland Hun (dermatologist). PLQ Eye Exam:08/07/2019 WNL @ [facility] Follow up in 1 year.  CBC within normal limits on 10/12/2019.  CMP updated on 10/12/2019.  He is due to update lab work today.  Orders for CBC and CMP were released.  He was diagnosed with COVID-19 on 02/17/2020 and received the monoclonal antibody infusion on 02/19/2020.  His symptoms have completely resolved.   Primary osteoarthritis of both knees: He has good range of motion of both knee joints on examination today.  No warmth or effusion was noted.  He has not been experiencing any discomfort in his knees.  He has no difficulty climbing steps or rising from a seated position.  Chondrocalcinosis: He has not had any recent flares.  He has no joint pain or inflammation.  Trapezius muscle spasm: He is not experiencing any trapezius muscle tenderness, tension, or spasms at this time.  Other medical conditions are listed as follows:   Essential hypertension  History of DVT (deep vein thrombosis) - He was recently switched from eliquis to [place].  He has been tolerating Xarelto without any side effects.  Mixed hyperlipidemia  Orders: Orders Placed This Encounter  Procedures  . CBC with Differential/Platelet  . COMPLETE METABOLIC PANEL WITH GFR  . Urinalysis, Routine w reflex microscopic  . Anti-DNA antibody, double-stranded  . C3 and C4  . Sedimentation rate   No orders of the defined types were placed in this encounter.     Follow-Up Instructions:  Return in about 5 months (around 08/11/2020) for Systemic lupus erythematosus, Osteoarthritis.   Ofilia Neas, PA-C  Note - This record has been created using Dragon software.  Chart creation errors have been sought, but may not always  have been located. Such creation errors do not reflect on  the standard of medical care.

## 2020-03-14 ENCOUNTER — Ambulatory Visit: Payer: No Typology Code available for payment source | Admitting: Physician Assistant

## 2020-03-14 ENCOUNTER — Encounter: Payer: Self-pay | Admitting: Physician Assistant

## 2020-03-14 ENCOUNTER — Other Ambulatory Visit: Payer: Self-pay

## 2020-03-14 VITALS — BP 120/74 | HR 61 | Resp 12 | Ht 71.0 in | Wt 197.0 lb

## 2020-03-14 DIAGNOSIS — M170 Bilateral primary osteoarthritis of knee: Secondary | ICD-10-CM

## 2020-03-14 DIAGNOSIS — M1120 Other chondrocalcinosis, unspecified site: Secondary | ICD-10-CM

## 2020-03-14 DIAGNOSIS — I10 Essential (primary) hypertension: Secondary | ICD-10-CM

## 2020-03-14 DIAGNOSIS — Z79899 Other long term (current) drug therapy: Secondary | ICD-10-CM

## 2020-03-14 DIAGNOSIS — M3219 Other organ or system involvement in systemic lupus erythematosus: Secondary | ICD-10-CM | POA: Diagnosis not present

## 2020-03-14 DIAGNOSIS — E782 Mixed hyperlipidemia: Secondary | ICD-10-CM

## 2020-03-14 DIAGNOSIS — Z86718 Personal history of other venous thrombosis and embolism: Secondary | ICD-10-CM

## 2020-03-14 DIAGNOSIS — M62838 Other muscle spasm: Secondary | ICD-10-CM

## 2020-03-15 ENCOUNTER — Encounter: Payer: Self-pay | Admitting: Nurse Practitioner

## 2020-03-15 DIAGNOSIS — Z8616 Personal history of COVID-19: Secondary | ICD-10-CM | POA: Insufficient documentation

## 2020-03-17 LAB — COMPLETE METABOLIC PANEL WITH GFR
AG Ratio: 1.6 (calc) (ref 1.0–2.5)
ALT: 20 U/L (ref 9–46)
AST: 19 U/L (ref 10–35)
Albumin: 4.6 g/dL (ref 3.6–5.1)
Alkaline phosphatase (APISO): 70 U/L (ref 35–144)
BUN: 8 mg/dL (ref 7–25)
CO2: 30 mmol/L (ref 20–32)
Calcium: 9.8 mg/dL (ref 8.6–10.3)
Chloride: 99 mmol/L (ref 98–110)
Creat: 0.78 mg/dL (ref 0.70–1.25)
GFR, Est African American: 111 mL/min/{1.73_m2} (ref >=60)
GFR, Est Non African American: 95 mL/min/{1.73_m2} (ref >=60)
Globulin: 2.8 g/dL (calc) (ref 1.9–3.7)
Glucose, Bld: 102 mg/dL — ABNORMAL HIGH (ref 65–99)
Potassium: 4.6 mmol/L (ref 3.5–5.3)
Sodium: 137 mmol/L (ref 135–146)
Total Bilirubin: 0.4 mg/dL (ref 0.2–1.2)
Total Protein: 7.4 g/dL (ref 6.1–8.1)

## 2020-03-17 LAB — C3 AND C4
C3 Complement: 129 mg/dL (ref 82–185)
C4 Complement: 31 mg/dL (ref 15–53)

## 2020-03-17 LAB — URINALYSIS, ROUTINE W REFLEX MICROSCOPIC
Bilirubin Urine: NEGATIVE
Glucose, UA: NEGATIVE
Hgb urine dipstick: NEGATIVE
Ketones, ur: NEGATIVE
Leukocytes,Ua: NEGATIVE
Nitrite: NEGATIVE
Protein, ur: NEGATIVE
Specific Gravity, Urine: 1.007 (ref 1.001–1.03)
pH: 7 (ref 5.0–8.0)

## 2020-03-17 LAB — CBC WITH DIFFERENTIAL/PLATELET
Absolute Monocytes: 677 cells/uL (ref 200–950)
Basophils Absolute: 42 cells/uL (ref 0–200)
Basophils Relative: 0.9 %
Eosinophils Absolute: 282 cells/uL (ref 15–500)
Eosinophils Relative: 6 %
HCT: 40.1 % (ref 38.5–50.0)
Hemoglobin: 13.6 g/dL (ref 13.2–17.1)
Lymphs Abs: 959 cells/uL (ref 850–3900)
MCH: 30.8 pg (ref 27.0–33.0)
MCHC: 33.9 g/dL (ref 32.0–36.0)
MCV: 90.9 fL (ref 80.0–100.0)
MPV: 9.8 fL (ref 7.5–12.5)
Monocytes Relative: 14.4 %
Neutro Abs: 2740 cells/uL (ref 1500–7800)
Neutrophils Relative %: 58.3 %
Platelets: 276 10*3/uL (ref 140–400)
RBC: 4.41 10*6/uL (ref 4.20–5.80)
RDW: 12.2 % (ref 11.0–15.0)
Total Lymphocyte: 20.4 %
WBC: 4.7 10*3/uL (ref 3.8–10.8)

## 2020-03-17 LAB — SEDIMENTATION RATE: Sed Rate: 14 mm/h (ref 0–20)

## 2020-03-17 LAB — ANTI-DNA ANTIBODY, DOUBLE-STRANDED: ds DNA Ab: 8 IU/mL — ABNORMAL HIGH

## 2020-03-17 NOTE — Progress Notes (Signed)
CBC and CMP WNL.  UA normal.  ESR and complements WNL.

## 2020-03-18 NOTE — Progress Notes (Signed)
dsDNA indeterminate range-stable. No change in therapy at this time.

## 2020-03-21 ENCOUNTER — Other Ambulatory Visit: Payer: Self-pay

## 2020-03-21 ENCOUNTER — Encounter: Payer: Self-pay | Admitting: Nurse Practitioner

## 2020-03-21 ENCOUNTER — Ambulatory Visit: Payer: No Typology Code available for payment source | Admitting: Nurse Practitioner

## 2020-03-21 VITALS — BP 114/75 | HR 62 | Temp 98.3°F | Ht 67.48 in | Wt 199.2 lb

## 2020-03-21 DIAGNOSIS — I82519 Chronic embolism and thrombosis of unspecified femoral vein: Secondary | ICD-10-CM

## 2020-03-21 DIAGNOSIS — K219 Gastro-esophageal reflux disease without esophagitis: Secondary | ICD-10-CM

## 2020-03-21 DIAGNOSIS — Z1211 Encounter for screening for malignant neoplasm of colon: Secondary | ICD-10-CM

## 2020-03-21 DIAGNOSIS — Z8616 Personal history of COVID-19: Secondary | ICD-10-CM

## 2020-03-21 DIAGNOSIS — E782 Mixed hyperlipidemia: Secondary | ICD-10-CM

## 2020-03-21 DIAGNOSIS — M3219 Other organ or system involvement in systemic lupus erythematosus: Secondary | ICD-10-CM

## 2020-03-21 DIAGNOSIS — I10 Essential (primary) hypertension: Secondary | ICD-10-CM

## 2020-03-21 NOTE — Assessment & Plan Note (Signed)
Chronic, stable with Protonix.  Continue current medication regimen and consider reduction in future.  Mag level annually.

## 2020-03-21 NOTE — Progress Notes (Signed)
BP 114/75   Pulse 62   Temp 98.3 F (36.8 C) (Oral)   Ht 5' 7.48" (1.714 m)   Wt 199 lb 3.2 oz (90.4 kg)   SpO2 97%   BMI 30.76 kg/m    Subjective:    Patient ID: Dwayne Jacobson, male    DOB: 23-Aug-1955, 65 y.o.   MRN: [id number]  HPI: SILVIO SAUSEDO is a 65 y.o. male  Chief Complaint  Patient presents with  . DVT  . Lupus  . Hypertension  . Hyperlipidemia   HYPERTENSION / HYPERLIPIDEMIA Continues on Lisinopril, HCTZ, Atorvastatin, and Amlodipine. Followed by Dr. Estanislado Pandy for SLE, taking Plaquenil daily with last visit with them on 03/14/20. Has history of a DVT and continues on Eliquis without issue.  Has history of Covid on 02/17/20 -- received MAB infusion.  Reports overall feeling well with no after effects. Satisfied with current treatment?yes Duration of hypertension:chronic BP monitoring frequency:not checking BP range:not checking BP medication side effects:no Duration of hyperlipidemia:chronic Cholesterol medication side effects:no Cholesterol supplements: none Medication compliance:good compliance Aspirin:no Recent stressors:no Recurrent headaches:no Visual changes:no Palpitations:no Dyspnea:no Chest pain:no Lower extremity edema:no Dizzy/lightheaded:no  GERD Continues on Protonix 40 MG daily without issue. GERD control status: stable  Satisfied with current treatment? yes Heartburn frequency: none Medication side effects: no  Medication compliance: stable Dysphagia: no Odynophagia:  no Hematemesis: no Blood in stool: no EGD: no  Relevant past medical, surgical, family and social history reviewed and updated as indicated. Interim medical history since our last visit reviewed. Allergies and medications reviewed and updated.  Review of Systems  Constitutional: Negative for activity change, diaphoresis, fatigue and fever.  Respiratory: Negative for cough, chest tightness, shortness of breath and wheezing.   Cardiovascular:  Negative for chest pain, palpitations and leg swelling.  Gastrointestinal: Negative.   Neurological: Negative.   Psychiatric/Behavioral: Negative.     Per HPI unless specifically indicated above     Objective:    BP 114/75   Pulse 62   Temp 98.3 F (36.8 C) (Oral)   Ht 5' 7.48" (1.714 m)   Wt 199 lb 3.2 oz (90.4 kg)   SpO2 97%   BMI 30.76 kg/m   Wt Readings from Last 3 Encounters:  03/21/20 199 lb 3.2 oz (90.4 kg)  03/14/20 197 lb (89.4 kg)  10/12/19 198 lb (89.8 kg)    Physical Exam Vitals and nursing note reviewed.  Constitutional:      General: He is awake. He is not in acute distress.    Appearance: He is well-developed and well-groomed. He is obese. He is not ill-appearing or toxic-appearing.  HENT:     Head: Normocephalic and atraumatic.     Right Ear: Hearing normal. No drainage.     Left Ear: Hearing normal. No drainage.  Eyes:     General: Lids are normal.        Right eye: No discharge.        Left eye: No discharge.     Conjunctiva/sclera: Conjunctivae normal.     Pupils: Pupils are equal, round, and reactive to light.  Neck:     Thyroid: No thyromegaly.     Vascular: No carotid bruit.     Trachea: Trachea normal.  Cardiovascular:     Rate and Rhythm: Normal rate and regular rhythm.     Heart sounds: Normal heart sounds, S1 normal and S2 normal. No murmur heard. No gallop.   Pulmonary:     Effort: Pulmonary effort is normal.  No accessory muscle usage or respiratory distress.     Breath sounds: Normal breath sounds.  Abdominal:     General: Bowel sounds are normal.     Palpations: Abdomen is soft. There is no hepatomegaly or splenomegaly.  Musculoskeletal:        General: Normal range of motion.     Cervical back: Normal range of motion and neck supple.     Right lower leg: No edema.     Left lower leg: No edema.  Skin:    General: Skin is warm and dry.     Capillary Refill: Capillary refill takes less than 2 seconds.  Neurological:     Mental  Status: He is alert and oriented to person, place, and time.     Deep Tendon Reflexes:     Reflex Scores:      Brachioradialis reflexes are 1+ on the right side and 1+ on the left side.      Patellar reflexes are 1+ on the right side and 1+ on the left side. Psychiatric:        Attention and Perception: Attention normal.        Mood and Affect: Mood normal.        Speech: Speech normal.        Behavior: Behavior normal. Behavior is cooperative.        Thought Content: Thought content normal.    Results for orders placed or performed in visit on 03/14/20  CBC with Differential/Platelet  Result Value Ref Range   WBC 4.7 3.8 - 10.8 Thousand/uL   RBC 4.41 4.20 - 5.80 Million/uL   Hemoglobin 13.6 13.2 - 17.1 g/dL   HCT 40.1 38.5 - 50.0 %   MCV 90.9 80.0 - 100.0 fL   MCH 30.8 27.0 - 33.0 pg   MCHC 33.9 32.0 - 36.0 g/dL   RDW 12.2 11.0 - 15.0 %   Platelets 276 140 - 400 Thousand/uL   MPV 9.8 7.5 - 12.5 fL   Neutro Abs 2,740 1,500 - 7,800 cells/uL   Lymphs Abs 959 850 - 3,900 cells/uL   Absolute Monocytes 677 200 - 950 cells/uL   Eosinophils Absolute 282 15 - 500 cells/uL   Basophils Absolute 42 0 - 200 cells/uL   Neutrophils Relative % 58.3 %   Total Lymphocyte 20.4 %   Monocytes Relative 14.4 %   Eosinophils Relative 6.0 %   Basophils Relative 0.9 %  COMPLETE METABOLIC PANEL WITH GFR  Result Value Ref Range   Glucose, Bld 102 (H) 65 - 99 mg/dL   BUN 8 7 - 25 mg/dL   Creat 0.78 0.70 - 1.25 mg/dL   GFR, Est Non African American 95 > OR = 60 mL/min/1.34m2   GFR, Est African American 111 > OR = 60 mL/min/1.20m2   BUN/Creatinine Ratio NOT APPLICABLE 6 - 22 (calc)   Sodium 137 135 - 146 mmol/L   Potassium 4.6 3.5 - 5.3 mmol/L   Chloride 99 98 - 110 mmol/L   CO2 30 20 - 32 mmol/L   Calcium 9.8 8.6 - 10.3 mg/dL   Total Protein 7.4 6.1 - 8.1 g/dL   Albumin 4.6 3.6 - 5.1 g/dL   Globulin 2.8 1.9 - 3.7 g/dL (calc)   AG Ratio 1.6 1.0 - 2.5 (calc)   Total Bilirubin 0.4 0.2 - 1.2  mg/dL   Alkaline phosphatase (APISO) 70 35 - 144 U/L   AST 19 10 - 35 U/L   ALT 20 9 - 46  U/L  Urinalysis, Routine w reflex microscopic  Result Value Ref Range   Color, Urine YELLOW YELLOW   APPearance CLEAR CLEAR   Specific Gravity, Urine 1.007 1.001 - 1.03   pH 7.0 5.0 - 8.0   Glucose, UA NEGATIVE NEGATIVE   Bilirubin Urine NEGATIVE NEGATIVE   Ketones, ur NEGATIVE NEGATIVE   Hgb urine dipstick NEGATIVE NEGATIVE   Protein, ur NEGATIVE NEGATIVE   Nitrite NEGATIVE NEGATIVE   Leukocytes,Ua NEGATIVE NEGATIVE  Anti-DNA antibody, double-stranded  Result Value Ref Range   ds DNA Ab 8 (H) IU/mL  C3 and C4  Result Value Ref Range   C3 Complement 129 82 - 185 mg/dL   C4 Complement 31 15 - 53 mg/dL  Sedimentation rate  Result Value Ref Range   Sed Rate 14 0 - 20 mm/h      Assessment & Plan:   Problem List Items Addressed This Visit      Cardiovascular and Mediastinum   DVT (deep venous thrombosis) (HCC) - Primary    Chronic, stable on Eliquis.  Had hematology consult in 2020 and was able to transition to Eliquis, which he has taken without issue.  CBC  and CMP next visit.      Hypertension    Chronic, stable with BP below goal.  Continue current medication regimen and adjust as needed.  Recommend he monitor BP at least weekly at home and document + focus on DASH diet.  BMP and TSH today.  Return in 6 months.      Relevant Orders   Basic Metabolic Panel (BMET)   TSH     Digestive   Gastroesophageal reflux disease without esophagitis    Chronic, stable with Protonix.  Continue current medication regimen and consider reduction in future.  Mag level annually.        Other   Hyperlipidemia    Chronic, ongoing.  Continue current medication regimen and adjust as needed.  Lipid panel today.      Relevant Orders   Lipid Profile   SLE (systemic lupus erythematosus) ([place])    Will continue collaboration with his rheumatologist Dr. Estanislado Pandy.  Continue current medication regimen  as prescribed by them.      History of 2019 novel coronavirus disease (COVID-19)    Acute and improved -- no current long term effects.       Other Visit Diagnoses    Colon cancer screening       Cologuard ordered   Relevant Orders   Cologuard       Follow up plan: Return in about 6 months (around 09/18/2020) for Annual physical.

## 2020-03-21 NOTE — Assessment & Plan Note (Signed)
Chronic, stable with BP below goal.  Continue current medication regimen and adjust as needed.  Recommend he monitor BP at least weekly at home and document + focus on DASH diet.  BMP and TSH today.  Return in 6 months.

## 2020-03-21 NOTE — Patient Instructions (Signed)
DASH Eating Plan DASH stands for Dietary Approaches to Stop Hypertension. The DASH eating plan is a healthy eating plan that has been shown to:  Reduce high blood pressure (hypertension).  Reduce your risk for type 2 diabetes, heart disease, and stroke.  Help with weight loss. What are tips for following this plan? Reading food labels  Check food labels for the amount of salt (sodium) per serving. Choose foods with less than 5 percent of the Daily Value of sodium. Generally, foods with less than 300 milligrams (mg) of sodium per serving fit into this eating plan.  To find whole grains, look for the word "whole" as the first word in the ingredient list. Shopping  Buy products labeled as "low-sodium" or "no salt added."  Buy fresh foods. Avoid canned foods and pre-made or frozen meals. Cooking  Avoid adding salt when cooking. Use salt-free seasonings or herbs instead of table salt or sea salt. Check with your health care provider or pharmacist before using salt substitutes.  Do not fry foods. Cook foods using healthy methods such as baking, boiling, grilling, roasting, and broiling instead.  Cook with heart-healthy oils, such as olive, canola, avocado, soybean, or sunflower oil. Meal planning  Eat a balanced diet that includes: ? 4 or more servings of fruits and 4 or more servings of vegetables each day. Try to fill one-half of your plate with fruits and vegetables. ? 6-8 servings of whole grains each day. ? Less than 6 oz (170 g) of lean meat, poultry, or fish each day. A 3-oz (85-g) serving of meat is about the same size as a deck of cards. One egg equals 1 oz (28 g). ? 2-3 servings of low-fat dairy each day. One serving is 1 cup (237 mL). ? 1 serving of nuts, seeds, or beans 5 times each week. ? 2-3 servings of heart-healthy fats. Healthy fats called omega-3 fatty acids are found in foods such as walnuts, flaxseeds, fortified milks, and eggs. These fats are also found in  cold-water fish, such as sardines, salmon, and mackerel.  Limit how much you eat of: ? Canned or prepackaged foods. ? Food that is high in trans fat, such as some fried foods. ? Food that is high in saturated fat, such as fatty meat. ? Desserts and other sweets, sugary drinks, and other foods with added sugar. ? Full-fat dairy products.  Do not salt foods before eating.  Do not eat more than 4 egg yolks a week.  Try to eat at least 2 vegetarian meals a week.  Eat more home-cooked food and less restaurant, buffet, and fast food.   Lifestyle  When eating at a restaurant, ask that your food be prepared with less salt or no salt, if possible.  If you drink alcohol: ? Limit how much you use to:  0-1 drink a day for women who are not pregnant.  0-2 drinks a day for men. ? Be aware of how much alcohol is in your drink. In the [place] one drink equals one 12 oz bottle of beer (355 mL), one 5 oz glass of wine (148 mL), or one 1 oz glass of hard liquor (44 mL). General information  Avoid eating more than 2,300 mg of salt a day. If you have hypertension, you may need to reduce your sodium intake to 1,500 mg a day.  Work with your health care provider to maintain a healthy body weight or to lose weight. Ask what an ideal weight is for you.    Get at least 30 minutes of exercise that causes your heart to beat faster (aerobic exercise) most days of the week. Activities may include walking, swimming, or biking.  Work with your health care provider or dietitian to adjust your eating plan to your individual calorie needs. What foods should I eat? Fruits All fresh, dried, or frozen fruit. Canned fruit in natural juice (without added sugar). Vegetables Fresh or frozen vegetables (raw, steamed, roasted, or grilled). Low-sodium or reduced-sodium tomato and vegetable juice. Low-sodium or reduced-sodium tomato sauce and tomato paste. Low-sodium or reduced-sodium canned vegetables. Grains Whole-grain  or whole-wheat bread. Whole-grain or whole-wheat pasta. Brown rice. Oatmeal. Quinoa. Bulgur. Whole-grain and low-sodium cereals. Pita bread. Low-fat, low-sodium crackers. Whole-wheat flour tortillas. Meats and other proteins Skinless chicken or turkey. Ground chicken or turkey. Pork with fat trimmed off. Fish and seafood. Egg whites. Dried beans, peas, or lentils. Unsalted nuts, nut butters, and seeds. Unsalted canned beans. Lean cuts of beef with fat trimmed off. Low-sodium, lean precooked or cured meat, such as sausages or meat loaves. Dairy Low-fat (1%) or fat-free (skim) milk. Reduced-fat, low-fat, or fat-free cheeses. Nonfat, low-sodium ricotta or cottage cheese. Low-fat or nonfat yogurt. Low-fat, low-sodium cheese. Fats and oils Soft margarine without trans fats. Vegetable oil. Reduced-fat, low-fat, or light mayonnaise and salad dressings (reduced-sodium). Canola, safflower, olive, avocado, soybean, and sunflower oils. Avocado. Seasonings and condiments Herbs. Spices. Seasoning mixes without salt. Other foods Unsalted popcorn and pretzels. Fat-free sweets. The items listed above may not be a complete list of foods and beverages you can eat. Contact a dietitian for more information. What foods should I avoid? Fruits Canned fruit in a light or heavy syrup. Fried fruit. Fruit in cream or butter sauce. Vegetables Creamed or fried vegetables. Vegetables in a cheese sauce. Regular canned vegetables (not low-sodium or reduced-sodium). Regular canned tomato sauce and paste (not low-sodium or reduced-sodium). Regular tomato and vegetable juice (not low-sodium or reduced-sodium). Pickles. Olives. Grains Baked goods made with fat, such as croissants, muffins, or some breads. Dry pasta or rice meal packs. Meats and other proteins Fatty cuts of meat. Ribs. Fried meat. Bacon. Bologna, salami, and other precooked or cured meats, such as sausages or meat loaves. Fat from the back of a pig (fatback).  Bratwurst. Salted nuts and seeds. Canned beans with added salt. Canned or smoked fish. Whole eggs or egg yolks. Chicken or turkey with skin. Dairy Whole or 2% milk, cream, and half-and-half. Whole or full-fat cream cheese. Whole-fat or sweetened yogurt. Full-fat cheese. Nondairy creamers. Whipped toppings. Processed cheese and cheese spreads. Fats and oils Butter. Stick margarine. Lard. Shortening. Ghee. Bacon fat. Tropical oils, such as coconut, palm kernel, or palm oil. Seasonings and condiments Onion salt, garlic salt, seasoned salt, table salt, and sea salt. Worcestershire sauce. Tartar sauce. Barbecue sauce. Teriyaki sauce. Soy sauce, including reduced-sodium. Steak sauce. Canned and packaged gravies. Fish sauce. Oyster sauce. Cocktail sauce. Store-bought horseradish. Ketchup. Mustard. Meat flavorings and tenderizers. Bouillon cubes. Hot sauces. Pre-made or packaged marinades. Pre-made or packaged taco seasonings. Relishes. Regular salad dressings. Other foods Salted popcorn and pretzels. The items listed above may not be a complete list of foods and beverages you should avoid. Contact a dietitian for more information. Where to find more information  National Heart, Lung, and Blood Institute: www.nhlbi.nih.gov  American Heart Association: www.heart.org  Academy of Nutrition and Dietetics: www.eatright.org  National Kidney Foundation: www.kidney.org Summary  The DASH eating plan is a healthy eating plan that has been shown to reduce high   blood pressure (hypertension). It may also reduce your risk for type 2 diabetes, heart disease, and stroke.  When on the DASH eating plan, aim to eat more fresh fruits and vegetables, whole grains, lean proteins, low-fat dairy, and heart-healthy fats.  With the DASH eating plan, you should limit salt (sodium) intake to 2,300 mg a day. If you have hypertension, you may need to reduce your sodium intake to 1,500 mg a day.  Work with your health care  provider or dietitian to adjust your eating plan to your individual calorie needs. This information is not intended to replace advice given to you by your health care provider. Make sure you discuss any questions you have with your health care provider. Document Revised: 12/22/2018 Document Reviewed: 12/22/2018 Elsevier Patient Education  2021 Elsevier Inc.   

## 2020-03-21 NOTE — Assessment & Plan Note (Signed)
Chronic, ongoing.  Continue current medication regimen and adjust as needed. Lipid panel today. 

## 2020-03-21 NOTE — Assessment & Plan Note (Signed)
Acute and improved -- no current long term effects.

## 2020-03-21 NOTE — Assessment & Plan Note (Signed)
Chronic, stable on Eliquis.  Had hematology consult in 2020 and was able to transition to Eliquis, which he has taken without issue.  CBC  and CMP next visit.

## 2020-03-21 NOTE — Assessment & Plan Note (Signed)
Will continue collaboration with his rheumatologist Dr. Estanislado Pandy.  Continue current medication regimen as prescribed by them.

## 2020-03-22 LAB — TSH: TSH: 1.62 u[IU]/mL (ref 0.450–4.500)

## 2020-03-22 LAB — LIPID PANEL
Chol/HDL Ratio: 3.5 ratio (ref 0.0–5.0)
Cholesterol, Total: 166 mg/dL (ref 100–199)
HDL: 48 mg/dL (ref >39)
LDL Chol Calc (NIH): 81 mg/dL (ref 0–99)
Triglycerides: 220 mg/dL — ABNORMAL HIGH (ref 0–149)
VLDL Cholesterol Cal: 37 mg/dL (ref 5–40)

## 2020-03-22 LAB — BASIC METABOLIC PANEL
BUN/Creatinine Ratio: 13 (ref 10–24)
BUN: 12 mg/dL (ref 8–27)
CO2: 22 mmol/L (ref 20–29)
Calcium: 9.8 mg/dL (ref 8.6–10.2)
Chloride: 94 mmol/L — ABNORMAL LOW (ref 96–106)
Creatinine, Ser: 0.91 mg/dL (ref 0.76–1.27)
GFR calc Af Amer: 103 mL/min/{1.73_m2} (ref >59)
GFR calc non Af Amer: 89 mL/min/{1.73_m2} (ref >59)
Glucose: 80 mg/dL (ref 65–99)
Potassium: 4.8 mmol/L (ref 3.5–5.2)
Sodium: 135 mmol/L (ref 134–144)

## 2020-03-22 NOTE — Progress Notes (Signed)
Contacted via [place] morning Tag, your labs have returned and overall they continue to be stable.  Triglycerides are a little elevated, make sure to continue your Atorvastatin daily and in future we may increase dose a little to get tighter control of levels.  Kidney function and thyroid level are all normal.  Any questions?   Keep being awesome!!  Thank you for allowing me to participate in your care. Kindest regards, Dwayne Jacobson

## 2020-04-06 ENCOUNTER — Encounter: Payer: Self-pay | Admitting: Rheumatology

## 2020-04-26 LAB — COLOGUARD: Cologuard: NEGATIVE

## 2020-05-01 ENCOUNTER — Telehealth: Payer: Self-pay

## 2020-05-01 NOTE — Telephone Encounter (Signed)
PA for pantoprazole initiated and approved for Pantoprazole. Key: [id number]

## 2020-05-02 LAB — COLOGUARD
COLOGUARD: NEGATIVE
Cologuard: NEGATIVE

## 2020-06-04 ENCOUNTER — Encounter: Payer: Self-pay | Admitting: Physician Assistant

## 2020-06-05 ENCOUNTER — Telehealth: Payer: Self-pay | Admitting: Physician Assistant

## 2020-06-05 DIAGNOSIS — M3219 Other organ or system involvement in systemic lupus erythematosus: Secondary | ICD-10-CM

## 2020-06-05 NOTE — Telephone Encounter (Signed)
Thank you :)

## 2020-06-05 NOTE — Telephone Encounter (Signed)
Noted  

## 2020-06-05 NOTE — Telephone Encounter (Signed)
Called office, pls see attached encounter from [facility], requesting specific orders last evening.  Pt has called and wanted lab appt for today, note suggest eval by PCP, no appt available, office suggest CRM for Jolene's review. Contact pt at [phone]

## 2020-06-05 NOTE — Telephone Encounter (Signed)
Called pt scheduled for tomorrow with Lauren 

## 2020-06-05 NOTE — Telephone Encounter (Signed)
Patient and his wife, Dwayne Jacobson, called the on-call service last night at 6:30 pm.  The patients wife acted as the primary historian. The patient feels that he is having a lupus flare.  He has continued to take plaquenil as prescribed and has not missed any doses.  He had a facial rash last week and used clobetasol solution topically, which has improved the rash.  He started to experience severe arthralgias and body aches 2 days ago.  He developed a fever yesterday while at work and had to leave work. He has been experiencing profound fatigue. He took tylenol 500 mg 2 tablets at 1 pm yesterday, which reduced his temperature and he was able to rest yesterday afternoon.  According to the patient he tested negative for covid-19.  He denied any SOB, pleuritic chest pain, or palpitations.  He denies any oral or nasal ulcerations.    I will pend autoimmune lab work: CBC with diff, CMP with GFR, UA, ESR, dsDNA, and complements, which he plans to have drawn today at his PCPs office. Explained that these labs will help [place] determine if he is having a lupus flare.  He was advised to be evaluated by his PCP to rule out infection.  Encouraged to get tested for the flu.   He can continue to take tylenol 500 mg 1 to 2 tablets every 8 hours as needed. He is unable to take NSAIDs due to taking xarelto.   Advised to continue to take plaquenil as prescribed.  He was advised to notify us if his facial rash returns.  A prednisone taper starting at 20 mg tapering by 5 mg every 4 days was sent to the pharmacy to start taking after being evaluated by his PCP today.  Advised to take prednisone with food in the mornings.   We will also call to schedule an appointment with [place] for further evaluation once infection has been ruled out.    All questions were addressed.   Hazel Sams, PA-C

## 2020-06-05 NOTE — Telephone Encounter (Signed)
Will need appointment, does anyone have any openings this afternoon or tomorrow?

## 2020-06-06 ENCOUNTER — Encounter: Payer: Self-pay | Admitting: Nurse Practitioner

## 2020-06-06 ENCOUNTER — Ambulatory Visit: Payer: No Typology Code available for payment source | Admitting: Nurse Practitioner

## 2020-06-06 ENCOUNTER — Other Ambulatory Visit: Payer: Self-pay

## 2020-06-06 VITALS — BP 104/64 | HR 57 | Temp 97.5°F | Wt 198.4 lb

## 2020-06-06 DIAGNOSIS — R5383 Other fatigue: Secondary | ICD-10-CM | POA: Insufficient documentation

## 2020-06-06 DIAGNOSIS — R21 Rash and other nonspecific skin eruption: Secondary | ICD-10-CM

## 2020-06-06 DIAGNOSIS — M3219 Other organ or system involvement in systemic lupus erythematosus: Secondary | ICD-10-CM

## 2020-06-06 MED ORDER — DOXYCYCLINE HYCLATE 100 MG PO TABS: 100.0000 mg | ORAL_TABLET | Freq: Two times a day (BID) | ORAL | 0 refills | Status: DC

## 2020-06-06 NOTE — Assessment & Plan Note (Addendum)
Tick disease vs cellulitis. Will check for rocky mountain spotted fever and lyme. Can use cool compresses to help with itching and bendaryl or hydrocortisone cream. Will treat with doxycycline BID for 14 days and adjust based on lab results.

## 2020-06-06 NOTE — Progress Notes (Signed)
Acute Office Visit  Subjective:    Patient ID: Dwayne Jacobson, male    DOB: 03-May-1955, 65 y.o.   MRN: [id number]  Chief Complaint  Patient presents with  . Fatigue    Pt states he is feeling better today. States he thinks he may be having a Lupus flare.     HPI Patient is in today for severe fatigue  FATIGUE  Duration:  Last 3 days Severity: severe  Onset: sudden Context when symptoms started:  unknown Symptoms improve with rest: gradually  Depressive symptoms: no Stress/anxiety: no Insomnia: no  Snoring: no Observed apnea by bed partner: no Daytime hypersomnolence:yes Wakes feeling refreshed: no History of sleep study: no Dysnea on exertion:  no Orthopnea/PND: no Chest pain: no Chronic cough: no Lower extremity edema: yes Arthralgias:no Myalgias: no Weakness: yes Rash: yes, rash on cheeks a few weeks ago from lupus. Also noted rash on right lower leg. It is itchy and burns. Has not done anything to help with symptoms.    Past Medical History:  Diagnosis Date  . DVT (deep venous thrombosis) ([place])   . Hyperlipidemia   . Hypertension   . Seizures ([place])   . Skin cancer   . Systemic lupus erythematosus ([employer])     Past Surgical History:  Procedure Laterality Date  . NO PAST SURGERIES      Family History  Problem Relation Age of Onset  . Diabetes Mother   . Hypertension Mother   . Hyperlipidemia Mother   . Stroke Mother   . Lung disease Mother   . Heart disease Mother   . Heart disease Father   . Lung disease Father   . Diabetes Sister   . Colon polyps Maternal Grandmother     Social History   Socioeconomic History  . Marital status: Married    Spouse name: Not on file  . Number of children: Not on file  . Years of education: Not on file  . Highest education level: Not on file  Occupational History  . Not on file  Tobacco Use  . Smoking status: Former Smoker    Packs/day: 1.50    Years: 40.00    Pack years: 60.00    Types: Cigarettes     Quit date: 08/30/2013    Years since quitting: 6.7  . Smokeless tobacco: Former Systems developer    Types: Secondary school teacher  . Vaping Use: Every day  . Substances: Nicotine  Substance and Sexual Activity  . Alcohol use: Yes    Alcohol/week: 8.0 - 10.0 standard drinks    Types: 8 - 10 Cans of beer per week  . Drug use: Never  . Sexual activity: Yes  Other Topics Concern  . Not on file  Social History Narrative  . Not on file   Social Determinants of Health   Financial Resource Strain: Not on file  Food Insecurity: Not on file  Transportation Needs: Not on file  Physical Activity: Not on file  Stress: Not on file  Social Connections: Not on file  Intimate Partner Violence: Not on file    Outpatient Medications Prior to Visit  Medication Sig Dispense Refill  . amLODipine (NORVASC) 5 MG tablet Take 1 tablet (5 mg total) by mouth daily. 90 tablet 4  . atorvastatin (LIPITOR) 10 MG tablet Take 1 tablet (10 mg total) by mouth daily. 90 tablet 4  . clobetasol (TEMOVATE) 0.05 % external solution Apply topically.    . cyclobenzaprine (FLEXERIL) 10 MG tablet  Take 10 mg by mouth 3 (three) times daily as needed.     . hydrochlorothiazide (HYDRODIURIL) 25 MG tablet TAKE 1 TABLET BY MOUTH EVERY DAY IN THE MORNING 90 tablet 3  . hydroxychloroquine (PLAQUENIL) 200 MG tablet Take 200 mg by mouth daily.    Marland Kitchen lisinopril (ZESTRIL) 20 MG tablet Take 1 tablet (20 mg total) by mouth daily. 90 tablet 4  . Multiple Vitamin (MULTIVITAMIN) tablet Take 1 tablet by mouth daily.    Marland Kitchen olopatadine (PATANOL) 0.1 % ophthalmic solution olopatadine 0.1 % eye drops    . pantoprazole (PROTONIX) 40 MG tablet Take 1 tablet (40 mg total) by mouth daily. 90 tablet 4  . predniSONE (DELTASONE) 5 MG tablet Take by mouth.    . rivaroxaban (XARELTO) 20 MG TABS tablet Take 1 tablet (20 mg total) by mouth daily with supper. 90 tablet 4  . ketoconazole (NIZORAL) 2 % shampoo Apply topically.     No facility-administered medications  prior to visit.    No Known Allergies  Review of Systems  Constitutional: Positive for fatigue and fever.  HENT: Negative.   Eyes: Negative.   Respiratory: Negative.   Cardiovascular: Negative.   Gastrointestinal: Positive for abdominal pain and diarrhea. Negative for constipation.  Genitourinary: Negative.   Musculoskeletal: Positive for arthralgias.  Skin: Positive for rash (right lower leg).  Neurological: Positive for light-headedness (intermittent) and headaches. Negative for dizziness.  Psychiatric/Behavioral: Negative.         Objective:    Physical Exam Vitals and nursing note reviewed.  Constitutional:      Appearance: Normal appearance.  HENT:     Head: Normocephalic.  Eyes:     Conjunctiva/sclera: Conjunctivae normal.  Cardiovascular:     Rate and Rhythm: Normal rate and regular rhythm.     Pulses: Normal pulses.     Heart sounds: Normal heart sounds.  Pulmonary:     Effort: Pulmonary effort is normal.     Breath sounds: Normal breath sounds.  Abdominal:     Palpations: Abdomen is soft.     Tenderness: There is no abdominal tenderness.  Musculoskeletal:        General: Normal range of motion.     Cervical back: Normal range of motion.  Skin:    General: Skin is warm.     Findings: Rash present.     Comments: Red, confluent rash to right lower extremity that is located on anterior shin and wraps around to the side and back of his leg. It is warm to touch with a scabbed area on the anterior shin.  Neurological:     General: No focal deficit present.     Mental Status: He is alert and oriented to person, place, and time.  Psychiatric:        Mood and Affect: Mood normal.        Behavior: Behavior normal.        Thought Content: Thought content normal.        Judgment: Judgment normal.     BP 104/64   Pulse (!) 57   Temp (!) 97.5 F (36.4 C) (Oral)   Wt 198 lb 6.4 oz (90 kg)   SpO2 97%   BMI 30.63 kg/m  Wt Readings from Last 3 Encounters:   06/06/20 198 lb 6.4 oz (90 kg)  03/21/20 199 lb 3.2 oz (90.4 kg)  03/14/20 197 lb (89.4 kg)    Health Maintenance Due  Topic Date Due  . COVID-19 Vaccine (1) Never  done  . PNA vac Low Risk Adult (1 of 2 - PCV13) 04/06/2020    There are no preventive care reminders to display for this patient.   Lab Results  Component Value Date   TSH 1.620 03/21/2020   Lab Results  Component Value Date   WBC 4.7 03/14/2020   HGB 13.6 03/14/2020   HCT 40.1 03/14/2020   MCV 90.9 03/14/2020   PLT 276 03/14/2020   Lab Results  Component Value Date   NA 135 03/21/2020   K 4.8 03/21/2020   CO2 22 03/21/2020   GLUCOSE 80 03/21/2020   BUN 12 03/21/2020   CREATININE 0.91 03/21/2020   BILITOT 0.4 03/14/2020   ALKPHOS 72 08/17/2019   AST 19 03/14/2020   ALT 20 03/14/2020   PROT 7.4 03/14/2020   ALBUMIN 5.1 (H) 08/17/2019   CALCIUM 9.8 03/21/2020   Lab Results  Component Value Date   CHOL 166 03/21/2020   Lab Results  Component Value Date   HDL 48 03/21/2020   Lab Results  Component Value Date   LDLCALC 81 03/21/2020   Lab Results  Component Value Date   TRIG 220 (H) 03/21/2020   Lab Results  Component Value Date   CHOLHDL 3.5 03/21/2020   No results found for: HGBA1C     Assessment & Plan:   Problem List Items Addressed This Visit      Musculoskeletal and Integument   Rash    Tick disease vs cellulitis. Will check for rocky mountain spotted fever and lyme. Can use cool compresses to help with itching and bendaryl or hydrocortisone cream. Will treat with doxycycline BID for 14 days and adjust based on lab results.       Relevant Orders   Lyme Disease Serology w/Reflex   Rocky mtn spotted fvr abs pnl(IgG+IgM)     Other   SLE (systemic lupus erythematosus) ([place])    Checking labs as stated above to rule out lupus flare. Will forward lab results as well to rheumatology. Continue prednisone started by rheumatology.       Relevant Orders   Comp Met (CMET)   CBC  with Differential   Urinalysis, Routine w reflex microscopic   Sed Rate (ESR)   C3 and C4   Anti-DNA antibody, double-stranded   Fatigue - Primary    Acute, severe within the past 3 days. With history of lupus, will check C3, C4, sed rate, anti-dna antibody, cmp, cbc, u/a, and flu per rheumatology recommendations. Flu test was negative in the office. Also checking for lyme and rocky mountain spotted fever with the rash. Will follow-up based on lab results.       Relevant Orders   Influenza A & B (STAT)   Lyme Disease Serology w/Reflex   Rocky mtn spotted fvr abs pnl(IgG+IgM)       Meds ordered this encounter  Medications  . doxycycline (VIBRA-TABS) 100 MG tablet    Sig: Take 1 tablet (100 mg total) by mouth 2 (two) times daily.    Dispense:  28 tablet    Refill:  0     Charyl Dancer, NP

## 2020-06-06 NOTE — Assessment & Plan Note (Signed)
Acute, severe within the past 3 days. With history of lupus, will check C3, C4, sed rate, anti-dna antibody, cmp, cbc, u/a, and flu per rheumatology recommendations. Flu test was negative in the office. Also checking for lyme and rocky mountain spotted fever with the rash. Will follow-up based on lab results.

## 2020-06-06 NOTE — Assessment & Plan Note (Signed)
Checking labs as stated above to rule out lupus flare. Will forward lab results as well to rheumatology. Continue prednisone started by rheumatology.

## 2020-06-07 LAB — SEDIMENTATION RATE: Sed Rate: 15 mm/hr (ref 0–30)

## 2020-06-07 LAB — URINALYSIS, ROUTINE W REFLEX MICROSCOPIC
Bilirubin, UA: NEGATIVE
Glucose, UA: NEGATIVE
Ketones, UA: NEGATIVE
Leukocytes,UA: NEGATIVE
Nitrite, UA: NEGATIVE
Protein,UA: NEGATIVE
RBC, UA: NEGATIVE
Specific Gravity, UA: 1.015 (ref 1.005–1.030)
Urobilinogen, Ur: 0.2 mg/dL (ref 0.2–1.0)
pH, UA: 7 (ref 5.0–7.5)

## 2020-06-07 LAB — COMPREHENSIVE METABOLIC PANEL
ALT: 26 IU/L (ref 0–44)
AST: 25 IU/L (ref 0–40)
Albumin/Globulin Ratio: 1.5 (ref 1.2–2.2)
Albumin: 4.9 g/dL — ABNORMAL HIGH (ref 3.8–4.8)
Alkaline Phosphatase: 79 IU/L (ref 44–121)
BUN/Creatinine Ratio: 15 (ref 10–24)
BUN: 14 mg/dL (ref 8–27)
Bilirubin Total: 0.3 mg/dL (ref 0.0–1.2)
CO2: 24 mmol/L (ref 20–29)
Calcium: 9.8 mg/dL (ref 8.6–10.2)
Chloride: 90 mmol/L — ABNORMAL LOW (ref 96–106)
Creatinine, Ser: 0.92 mg/dL (ref 0.76–1.27)
Globulin, Total: 3.3 g/dL (ref 1.5–4.5)
Glucose: 103 mg/dL — ABNORMAL HIGH (ref 65–99)
Potassium: 4.4 mmol/L (ref 3.5–5.2)
Sodium: 131 mmol/L — ABNORMAL LOW (ref 134–144)
Total Protein: 8.2 g/dL (ref 6.0–8.5)
eGFR: 92 mL/min/{1.73_m2} (ref >59)

## 2020-06-07 LAB — CBC WITH DIFFERENTIAL/PLATELET
Basophils Absolute: 0 10*3/uL (ref 0.0–0.2)
Basos: 0 %
EOS (ABSOLUTE): 0 10*3/uL (ref 0.0–0.4)
Eos: 0 %
Hematocrit: 40.7 % (ref 37.5–51.0)
Hemoglobin: 14 g/dL (ref 13.0–17.7)
Immature Grans (Abs): 0 10*3/uL (ref 0.0–0.1)
Immature Granulocytes: 0 %
Lymphocytes Absolute: 0.7 10*3/uL (ref 0.7–3.1)
Lymphs: 8 %
MCH: 31 pg (ref 26.6–33.0)
MCHC: 34.4 g/dL (ref 31.5–35.7)
MCV: 90 fL (ref 79–97)
Monocytes Absolute: 0.9 10*3/uL (ref 0.1–0.9)
Monocytes: 10 %
Neutrophils Absolute: 8 10*3/uL — ABNORMAL HIGH (ref 1.4–7.0)
Neutrophils: 82 %
Platelets: 295 10*3/uL (ref 150–450)
RBC: 4.51 x10E6/uL (ref 4.14–5.80)
RDW: 12.8 % (ref 11.6–15.4)
WBC: 9.7 10*3/uL (ref 3.4–10.8)

## 2020-06-07 LAB — C3 AND C4
Complement C3, Serum: 182 mg/dL — ABNORMAL HIGH (ref 82–167)
Complement C4, Serum: 35 mg/dL (ref 12–38)

## 2020-06-07 LAB — VERITOR FLU A/B WAIVED
Influenza A: NEGATIVE
Influenza B: NEGATIVE

## 2020-06-07 LAB — ANTI-DNA ANTIBODY, DOUBLE-STRANDED: dsDNA Ab: 8 IU/mL (ref 0–9)

## 2020-06-09 NOTE — Progress Notes (Signed)
I called the patient to check on how he is feeling and to review lab work.  His lab work is not consistent with a SLE flare.  He was advised to continue on the current dose of plaquenil.   His symptoms have improved significantly after starting on the prednisone taper. He is no longer having a fever, rash, body, aches, or fatigue.    He still has several orders pending.    He was advised to notify us if his symptoms return.

## 2020-06-10 LAB — ROCKY MTN SPOTTED FVR ABS PNL(IGG+IGM)
RMSF IgG: NEGATIVE
RMSF IgM: 0.4 index (ref 0.00–0.89)

## 2020-06-10 LAB — LYME DISEASE SEROLOGY W/REFLEX: Lyme Total Antibody EIA: NEGATIVE

## 2020-07-25 ENCOUNTER — Ambulatory Visit: Payer: Self-pay

## 2020-07-25 NOTE — Telephone Encounter (Signed)
      Message from [place] sent at 07/25/2020  2:51 PM EDT  Threasa Beards from [facility] dermatology, called to ask if its ok for patient to stay off of lisinopril for 10 days, while he is on the antibiotics given by them. Please call back       Call History   Type Contact Phone/Fax User  07/25/2020 02:50 PM EDT Phone (Incoming) melanie (Other) [phone] [place], Gerlene Burdock   Dermotology, Dallie Dad P.A, reports pt. Has cellulitis and pt. Needs to be on Bactrim or Keflex. Will need to hold lisinopril x 10 days. Please contact her at [phone] to discuss.

## 2020-08-15 NOTE — Progress Notes (Signed)
Office Visit Note  Patient: Dwayne Jacobson             Date of Birth: 26-Dec-1955           MRN: [id number]             PCP: Venita Lick, NP Referring: Venita Lick, NP Visit Date: 08/29/2020 Occupation: @GUAROCC @  Subjective:  Medication management   History of Present Illness: Dwayne Jacobson is a 65 y.o. male with history of systemic lupus and cutaneous lupus.  He states he has been doing well on hydroxychloroquine 1 tablet daily.  Has been followed by dermatologist.  He has been using sunscreen as needed.  He is currently on prednisone 5 mg p.o. daily although he is not certain if he is taking the medication.  He does not recall the reason he started taking prednisone.  Activities of Daily Living:  Patient reports morning stiffness for 5 minutes.   Patient Denies nocturnal pain.  Difficulty dressing/grooming: Denies Difficulty climbing stairs: Denies Difficulty getting out of chair: Denies Difficulty using hands for taps, buttons, cutlery, and/or writing: Denies  Review of Systems  Constitutional:  Positive for fatigue.  HENT:  Negative for mouth sores, mouth dryness and nose dryness.   Eyes:  Positive for dryness. Negative for pain and itching.  Respiratory:  Negative for shortness of breath and difficulty breathing.   Cardiovascular:  Negative for chest pain and palpitations.  Gastrointestinal:  Negative for blood in stool, constipation and diarrhea.  Endocrine: Negative for increased urination.  Genitourinary:  Negative for difficulty urinating.  Musculoskeletal:  Positive for morning stiffness. Negative for joint pain, joint pain, joint swelling, myalgias, muscle tenderness and myalgias.  Skin:  Negative for color change, rash and redness.  Allergic/Immunologic: Negative for susceptible to infections.  Neurological:  Negative for dizziness, numbness, headaches, memory loss and weakness.  Hematological:  Positive for bruising/bleeding tendency.   Psychiatric/Behavioral:  Negative for confusion.    PMFS History:  Patient Active Problem List   Diagnosis Date Noted   Rash 06/06/2020   Fatigue 06/06/2020   History of 2019 novel coronavirus disease (COVID-19) 03/15/2020   Gastroesophageal reflux disease without esophagitis 08/17/2019   Chondrocalcinosis 01/13/2017   Primary osteoarthritis of both knees 01/13/2017   DVT (deep venous thrombosis) (HCC)    Hypertension    Hyperlipidemia    SLE (systemic lupus erythematosus) ([place]) 01/18/2014    Past Medical History:  Diagnosis Date   DVT (deep venous thrombosis) (HCC)    Hyperlipidemia    Hypertension    Seizures ([place])    Skin cancer    Systemic lupus erythematosus ([place])     Family History  Problem Relation Age of Onset   Diabetes Mother    Hypertension Mother    Hyperlipidemia Mother    Stroke Mother    Lung disease Mother    Heart disease Mother    Heart disease Father    Lung disease Father    Diabetes Sister    Colon polyps Maternal Grandmother    Past Surgical History:  Procedure Laterality Date   NO PAST SURGERIES     Social History   Social History Narrative   Not on file   Immunization History  Administered Date(s) Administered   Influenza,inj,Quad PF,6+ Mos 01/03/2015, 11/05/2016, 11/07/2018   Pneumococcal Polysaccharide-23 09/29/2012   Td 03/25/2006, 08/06/2016     Objective: Vital Signs: BP 127/79 (BP Location: Left Arm, Patient Position: Sitting, Cuff Size: Normal)   Pulse 64  Resp 16   Ht 5\' 11"  (1.803 m)   Wt 193 lb (87.5 kg)   BMI 26.92 kg/m    Physical Exam Vitals and nursing note reviewed.  Constitutional:      Appearance: He is well-developed.  HENT:     Head: Normocephalic and atraumatic.  Eyes:     Conjunctiva/sclera: Conjunctivae normal.     Pupils: Pupils are equal, round, and reactive to light.  Cardiovascular:     Rate and Rhythm: Normal rate and regular rhythm.     Heart sounds: Normal heart sounds.  Pulmonary:      Effort: Pulmonary effort is normal.     Breath sounds: Normal breath sounds.  Abdominal:     General: Bowel sounds are normal.     Palpations: Abdomen is soft.  Musculoskeletal:     Cervical back: Normal range of motion and neck supple.  Skin:    General: Skin is warm and dry.     Capillary Refill: Capillary refill takes less than 2 seconds.  Neurological:     Mental Status: He is alert and oriented to person, place, and time.  Psychiatric:        Behavior: Behavior normal.     Musculoskeletal Exam: C-spine thoracic and lumbar spine were in good range of motion.  Shoulder joints, elbow joints, wrist joints, MCPs PIPs and DIPs with good range of motion with no synovitis.  PIP and DIP thickening was noted.  Hip joints, knee joints, ankles, MTPs and PIPs with good range of motion with no synovitis.  CDAI Exam: CDAI Score: -- Patient Global: --; Provider Global: -- Swollen: --; Tender: -- Joint Exam 08/29/2020   No joint exam has been documented for this visit   There is currently no information documented on the homunculus. Go to the Rheumatology activity and complete the homunculus joint exam.  Investigation: No additional findings.  Imaging: No results found.  Recent Labs: Lab Results  Component Value Date   WBC 9.7 06/06/2020   HGB 14.0 06/06/2020   PLT 295 06/06/2020   NA 131 (L) 06/06/2020   K 4.4 06/06/2020   CL 90 (L) 06/06/2020   CO2 24 06/06/2020   GLUCOSE 103 (H) 06/06/2020   BUN 14 06/06/2020   CREATININE 0.92 06/06/2020   BILITOT 0.3 06/06/2020   ALKPHOS 79 06/06/2020   AST 25 06/06/2020   ALT 26 06/06/2020   PROT 8.2 06/06/2020   ALBUMIN 4.9 (H) 06/06/2020   CALCIUM 9.8 06/06/2020   GFRAA 103 03/21/2020    Speciality Comments: PLQ Eye Exam: 04/06/2020 WNL @ [place] Follow up in 1 year  Procedures:  No procedures performed Allergies: Patient has no known allergies.   Assessment / Plan:     Visit Diagnoses: Other systemic lupus  erythematosus with other organ involvement ([place]) - Positive dsDNA, positive Ro. History of rash, diagnosed with lupus in the past: Patient was diagnosed with lupus by Dr. Jefm Bryant many years ago.-He denies any history of oral ulcers, nasal ulcers, malar rash, photosensitivity.  He has not had any recent skin rash.  Use of sunscreen was emphasized.  Plan: Urinalysis, Routine w reflex microscopic, Anti-DNA antibody, double-stranded, C3 and C4, Sedimentation rate, Lupus Anticoagulant Eval w/Reflex  High risk medication use - Plaquenil 200 mg 1 tablet by mouth daily-prescribed by Dr. Kirkland Hun (dermatologist).  According to his medication list he is on prednisone 5 mg p.o. daily.  He is not sure if he is taking the medication.  I advised  him to bring the medication list at the next visit.  PLQ Eye Exam:08/22/20 WNL.  CBC and CMP on Jun 06, 2020 were normal.  Patient states he had eye examination on July 22nd which was normal.  We do not have the results available.  Primary osteoarthritis of both knees-he has discomfort off and on.  Chondrocalcinosis-he denies any flares of pseudogout.  Trapezius muscle spasm-he continues to have some trapezius spasm off and on.  Essential hypertension-his blood pressure is normal.  Mixed hyperlipidemia-increased risk of heart disease with autoimmune disease was discussed.  Dietary modifications and exercise was emphasized and handout was placed in the AVS.  History of DVT (deep vein thrombosis) - Anticardiolipin and beta-2 GP 1 were negative in the past.  He was initially treated with Coumadin and is currently on Xarelto.  Orders: Orders Placed This Encounter  Procedures   Urinalysis, Routine w reflex microscopic   Anti-DNA antibody, double-stranded   C3 and C4   Sedimentation rate   Lupus Anticoagulant Eval w/Reflex    No orders of the defined types were placed in this encounter.    Follow-Up Instructions: Return for SLE.   Bo Merino, MD  Note  - This record has been created using Editor, commissioning.  Chart creation errors have been sought, but may not always  have been located. Such creation errors do not reflect on  the standard of medical care.

## 2020-08-29 ENCOUNTER — Encounter: Payer: Self-pay | Admitting: Rheumatology

## 2020-08-29 ENCOUNTER — Ambulatory Visit (INDEPENDENT_AMBULATORY_CARE_PROVIDER_SITE_OTHER): Payer: No Typology Code available for payment source | Admitting: Rheumatology

## 2020-08-29 ENCOUNTER — Other Ambulatory Visit: Payer: Self-pay

## 2020-08-29 VITALS — BP 127/79 | HR 64 | Resp 16 | Ht 71.0 in | Wt 193.0 lb

## 2020-08-29 DIAGNOSIS — M170 Bilateral primary osteoarthritis of knee: Secondary | ICD-10-CM | POA: Diagnosis not present

## 2020-08-29 DIAGNOSIS — M1120 Other chondrocalcinosis, unspecified site: Secondary | ICD-10-CM | POA: Diagnosis not present

## 2020-08-29 DIAGNOSIS — I10 Essential (primary) hypertension: Secondary | ICD-10-CM

## 2020-08-29 DIAGNOSIS — Z79899 Other long term (current) drug therapy: Secondary | ICD-10-CM

## 2020-08-29 DIAGNOSIS — E782 Mixed hyperlipidemia: Secondary | ICD-10-CM

## 2020-08-29 DIAGNOSIS — M62838 Other muscle spasm: Secondary | ICD-10-CM

## 2020-08-29 DIAGNOSIS — M3219 Other organ or system involvement in systemic lupus erythematosus: Secondary | ICD-10-CM | POA: Diagnosis not present

## 2020-08-29 DIAGNOSIS — Z86718 Personal history of other venous thrombosis and embolism: Secondary | ICD-10-CM

## 2020-08-29 NOTE — Patient Instructions (Signed)
Heart Disease Prevention   Your inflammatory disease increases your risk of heart disease which includes heart attack, stroke, atrial fibrillation (irregular heartbeats), high blood pressure, heart failure and atherosclerosis (plaque in the arteries).  It is important to reduce your risk by:   Keep blood pressure, cholesterol, and blood sugar at healthy levels   Smoking Cessation   Maintain a healthy weight  BMI 20-25   Eat a healthy diet  Plenty of fresh fruit, vegetables, and whole grains  Limit saturated fats, foods high in sodium, and added sugars  DASH and Mediterranean diet   Increase physical activity  Recommend moderate physically activity for 150 minutes per week/ 30 minutes a day for five days a week These can be broken up into three separate ten-minute sessions during the day.   Reduce Stress  Meditation, slow breathing exercises, yoga, coloring books  Dental visits twice a year   

## 2020-09-02 LAB — URINALYSIS, ROUTINE W REFLEX MICROSCOPIC
Bilirubin Urine: NEGATIVE
Glucose, UA: NEGATIVE
Hgb urine dipstick: NEGATIVE
Ketones, ur: NEGATIVE
Leukocytes,Ua: NEGATIVE
Nitrite: NEGATIVE
Protein, ur: NEGATIVE
Specific Gravity, Urine: 1.015 (ref 1.001–1.035)
pH: 5.5 (ref 5.0–8.0)

## 2020-09-02 LAB — RFLX DRVVT CONFRIM: DRVVT CONFIRM: NEGATIVE

## 2020-09-02 LAB — LUPUS ANTICOAGULANT EVAL W/ REFLEX
PTT-LA Screen: 54 s — ABNORMAL HIGH (ref <=40)
dRVVT: 82 s — ABNORMAL HIGH (ref <=45)

## 2020-09-02 LAB — C3 AND C4
C3 Complement: 149 mg/dL (ref 82–185)
C4 Complement: 29 mg/dL (ref 15–53)

## 2020-09-02 LAB — SEDIMENTATION RATE: Sed Rate: 14 mm/h (ref 0–20)

## 2020-09-02 LAB — RFLX HEXAGONAL PHASE CONFIRM: Hexagonal Phase Conf: NEGATIVE

## 2020-09-02 LAB — ANTI-DNA ANTIBODY, DOUBLE-STRANDED: ds DNA Ab: 8 IU/mL — ABNORMAL HIGH

## 2020-09-03 NOTE — Progress Notes (Signed)
dsDNA is a stable,and lupus anticoagulant is negative.

## 2020-09-19 ENCOUNTER — Ambulatory Visit (INDEPENDENT_AMBULATORY_CARE_PROVIDER_SITE_OTHER): Payer: No Typology Code available for payment source | Admitting: Nurse Practitioner

## 2020-09-19 ENCOUNTER — Other Ambulatory Visit: Payer: Self-pay

## 2020-09-19 ENCOUNTER — Encounter: Payer: Self-pay | Admitting: Nurse Practitioner

## 2020-09-19 VITALS — BP 126/78 | HR 63 | Temp 98.0°F | Ht 70.79 in | Wt 191.0 lb

## 2020-09-19 DIAGNOSIS — N400 Enlarged prostate without lower urinary tract symptoms: Secondary | ICD-10-CM

## 2020-09-19 DIAGNOSIS — K219 Gastro-esophageal reflux disease without esophagitis: Secondary | ICD-10-CM

## 2020-09-19 DIAGNOSIS — H6983 Other specified disorders of Eustachian tube, bilateral: Secondary | ICD-10-CM | POA: Diagnosis not present

## 2020-09-19 DIAGNOSIS — I10 Essential (primary) hypertension: Secondary | ICD-10-CM | POA: Diagnosis not present

## 2020-09-19 DIAGNOSIS — H6123 Impacted cerumen, bilateral: Secondary | ICD-10-CM | POA: Diagnosis not present

## 2020-09-19 DIAGNOSIS — Z86718 Personal history of other venous thrombosis and embolism: Secondary | ICD-10-CM

## 2020-09-19 DIAGNOSIS — M3219 Other organ or system involvement in systemic lupus erythematosus: Secondary | ICD-10-CM | POA: Diagnosis not present

## 2020-09-19 DIAGNOSIS — E782 Mixed hyperlipidemia: Secondary | ICD-10-CM | POA: Diagnosis not present

## 2020-09-19 DIAGNOSIS — Z0000 Encounter for general adult medical examination without abnormal findings: Secondary | ICD-10-CM

## 2020-09-19 DIAGNOSIS — H6120 Impacted cerumen, unspecified ear: Secondary | ICD-10-CM | POA: Insufficient documentation

## 2020-09-19 MED ORDER — PREDNISONE 20 MG PO TABS: 40.0000 mg | ORAL_TABLET | Freq: Every day | ORAL | 0 refills | Status: AC

## 2020-09-19 MED ORDER — LISINOPRIL 20 MG PO TABS: 20.0000 mg | ORAL_TABLET | Freq: Every day | ORAL | 4 refills | Status: DC

## 2020-09-19 MED ORDER — PANTOPRAZOLE SODIUM 40 MG PO TBEC: 40.0000 mg | DELAYED_RELEASE_TABLET | Freq: Every day | ORAL | 4 refills | Status: DC

## 2020-09-19 MED ORDER — HYDROCHLOROTHIAZIDE 25 MG PO TABS: ORAL_TABLET | ORAL | 4 refills | Status: DC

## 2020-09-19 MED ORDER — AMLODIPINE BESYLATE 5 MG PO TABS: 5.0000 mg | ORAL_TABLET | Freq: Every day | ORAL | 4 refills | Status: DC

## 2020-09-19 MED ORDER — ATORVASTATIN CALCIUM 10 MG PO TABS: 10.0000 mg | ORAL_TABLET | Freq: Every day | ORAL | 4 refills | Status: DC

## 2020-09-19 MED ORDER — RIVAROXABAN 20 MG PO TABS: 20.0000 mg | ORAL_TABLET | Freq: Every day | ORAL | 4 refills | Status: DC

## 2020-09-19 NOTE — Assessment & Plan Note (Signed)
Chronic, stable on Eliquis.  Had hematology consult in 2020 and was able to transition to Eliquis, which he has taken without issue and has offered benefit to lifestyle.  CBC  and CMP today.

## 2020-09-19 NOTE — Assessment & Plan Note (Signed)
R>L, minimal to left and able to view TM.  Nursing irrigated both ears without issue.  Clear post procedure and tolerated well.  Return for worsening or ongoing issues.

## 2020-09-19 NOTE — Assessment & Plan Note (Signed)
Will continue collaboration with his rheumatologist Dr. Deveshwar.  Continue current medication regimen as prescribed by them.  Recent note and labs reviewed. 

## 2020-09-19 NOTE — Assessment & Plan Note (Addendum)
Chronic, stable with BP below goal.  Continue current medication regimen and adjust as needed.  Recommend he monitor BP at least weekly at home and document + focus on DASH diet.  CMP and TSH today.  Refills in.  Return in 6 months.

## 2020-09-19 NOTE — Assessment & Plan Note (Signed)
Chronic, stable with Protonix.  Continue current medication regimen and consider reduction in future.  Mag level annually.

## 2020-09-19 NOTE — Patient Instructions (Signed)
Aneurisma de aorta abdominal Abdominal Aortic Aneurysm  Un aneurisma es un bulto en un vaso sanguneo que transporta la sangre desde el corazn (arteria). Ocurre cuando la sangre hace presin contra una parte dbil o daada de la pared del vaso sanguneo. Un aneurisma de aorta abdominal se presenta en el vaso sanguneo principal que transporta la sangre desde el corazn (aorta) al resto del cuerpo. La mayora de los aneurismas no causan problemas, pero algunos s. Si un aneurisma crece, puede estallar o romperse. Esto produce sangrado dentro delcuerpo. Esto es Engineer, maintenance (IT). Puede ser potencialmente mortal. Cules son las causas? Se desconoce la causa exacta de esta afeccin. Qu incrementa el riesgo? Los siguientes factores pueden hacer que sea ms propenso a Armed forces training and education officer afeccin: Ser hombre y mayor de 61 aos. Ser descendiente de europeos del norte. Consumir nicotina o tabaco actualmente o haberlo hecho en el pasado. Tener antecedentes familiares de aneurisma. Tener alguno de estos problemas: Endurecimiento de los vasos sanguneos que transportan la sangre desde el corazn al resto del cuerpo. Irritacin e hinchazn de las paredes de los vasos sanguneos que transportan la sangre desde el corazn al resto del cuerpo. Determinados problemas genticos. Tener mucho sobrepeso. Una infeccin en la pared de la aorta. Colesterol alto. Presin arterial alta (hipertensin arterial). Cules son los signos o sntomas? Los sntomas dependen del tamao del aneurisma y la velocidad con la que crece. La mayora de los aneurismas crece lentamente y no causa sntomas. Si hay sntomas, es posible que: Sienta un dolor muy intenso en el vientre (abdomen), el costado del cuerpo o la parte baja de la espalda. Se sienta lleno despus de comer solo un poco de alimento. Sienta un bulto pulstil en el vientre. Tenga estos problemas en los pies o los dedos de los pies: Social research officer, government. La piel se torna de color  azul. Llagas. Tiene dificultad para defecar (estreimiento). Problemas para orinar. Si el aneurisma se rompe, usted puede: Scientist, clinical (histocompatibility and immunogenetics) repentino y Administrator, arts fuerte en el vientre, en un costado o en la espalda. Siente que va a vomitar. Vomita. Se siente mareado. Se desmaya. Cmo se trata? El tratamiento de esta afeccin depende de lo siguiente: El tamao del aneurisma. La velocidad con la que crece. Su edad. El riesgo de que el aneurisma [place]. Si el aneurisma es de menos de 2 pulgadas (5 cm), el mdico puede: Controlarlo con frecuencia para ver si est creciendo. Pueden hacerle un estudio de diagnstico por imgenes (ecografa) para controlarlo entre cada 3 y 6 meses, todos los aos o cada algunos aos. Darle medicamentos para: Hipertensin arterial. Dolor. Infeccin. Si el aneurisma mide ms de 2 pulgadas (5 cm), es posible que necesite cirugapara repararlo. Siga estas instrucciones en su casa: Comida y bebida  Siga una dieta cardiosaludable. Coma una gran cantidad de lo siguiente: Frutas y verduras frescas. Cereales integrales. Protena con bajo contenido de grasa [place]). Productos lcteos descremados. Evitar alimentos ricos en grasa saturada y colesterol. Estos alimentos incluyen las carnes rojas y algunos productos lcteos.  Estilo de vida     No consuma ningn producto que contenga nicotina o tabaco, como cigarrillos, cigarrillos electrnicos y tabaco de Higher education careers adviser. Si necesita ayuda para dejar de fumar, consulte al mdico. Mantngase activo y haga ejercicio. Pregntele al mdico con qu frecuencia debe hacer ejercicio y qu tipos de ejercicios son seguros para usted. Mantenga un peso saludable. Consumo de alcohol No beba alcohol si: El mdico le indica que no lo haga. Est embarazada, puede estar embarazada o est tratando  de quedar embarazada. Si bebe alcohol: Limite la cantidad que bebe: De 0 a 1 medida por da para las mujeres. De 0 a 2 medidas por da para los  hombres. Est atento a la cantidad de alcohol que hay en las bebidas que toma. En los [place], una medida equivale a una botella de cerveza de 12 oz (355 ml), un vaso de vino de 5 oz (148 ml) o un vaso de una bebida alcohlica de alta graduacin de 1 oz (44 ml). Instrucciones generales Use los medicamentos de venta libre y los recetados solamente como se lo haya indicado el mdico. Mantenga la presin arterial dentro de los rangos normales. Contrlesela en horarios regulares. Pregntele al mdico qu nivel debe tener. Hgase controles peridicos de los niveles de azcar en la sangre (glucosa) y de colesterol. Siga los pasos para Data processing manager de lo normal. Evite levantar objetos pesados y Optometrist actividades que demanden mucho esfuerzo. Pregunte qu actividades son seguras para usted. Si puede, averige sus antecedentes mdicos familiares. Concurra a todas visitas de seguimiento como se lo haya indicado el mdico. Esto es importante. Comunquese con un mdico si: Le duele el vientre, el costado o la espalda. Tiene una sensacin pulstil en el vientre. Tiene fiebre. Solicite ayuda de inmediato si: Tree surgeon repentino y Dealer vientre, en un costado o en la espalda. Tiene ganas de vomitar o vomita. Se siente mareado o se desmaya. El Financial trader late rpido cuando est de pie. Tiene la piel sudorosa y fra (hmeda). Le falta el aire. Tiene dificultad para defecar. Tiene dificultad para orinar. Estos sntomas pueden Sales executive. No espere a ver si los sntomas desaparecen. Solicite atencin mdica de inmediato. Comunquese con el servicio de emergencias de su localidad (911 en los [place]). No conduzca por sus propios medios [employer]. Resumen Un aneurisma es un bulto en uno de los vasos sanguneos que transportan la sangre desde el corazn (arteria). Un aneurisma de aorta abdominal se presenta en el vaso sanguneo principal que transporta  la sangre desde el corazn (aorta) al resto del cuerpo. Esta afeccin puede producir sangrado dentro del cuerpo. Puede ser potencialmente mortal. El riesgo puede aumentar si es hombre, tiene 60 aos o ms y es descendiente de europeos del norte. El riesgo tambin puede aumentar por el consumo de nicotina o tabaco, o por tener casos de aneurisma en la familia. Obtenga ayuda de inmediato si tiene sntomas de un estallido de aneurisma. Esta informacin no tiene Marine scientist el consejo del mdico. Asegresede hacerle al mdico cualquier pregunta que tenga. Document Revised: 02/14/2019 Document Reviewed: 02/14/2019 Elsevier Patient Education  2022 [employer].

## 2020-09-19 NOTE — Addendum Note (Signed)
Addended by: Marnee Guarneri T on: 09/19/2020 10:03 AM   Modules accepted: Orders

## 2020-09-19 NOTE — Assessment & Plan Note (Signed)
Chronic, ongoing.  Continue current medication regimen and adjust as needed. Lipid panel today. 

## 2020-09-19 NOTE — Assessment & Plan Note (Signed)
Reviewed health maintenance with patient today: - Cologuard up to date, due next 04/27/2023 - Tetanus up to date, due next 08/27/2026 - Shingrix = refuses today - Covid vaccines = refuses today - AAA screening = is due and qualifies, education provided and will think about this - PCV13 = age 65 and due today, educated on this, wishes to get next visit.

## 2020-09-19 NOTE — Progress Notes (Addendum)
BP 126/78 (BP Location: Left Arm)   Pulse 63   Temp 98 F (36.7 C)   Ht 5' 10.79" (1.798 m)   Wt 191 lb (86.6 kg)   SpO2 98%   BMI 26.80 kg/m    Subjective:    Patient ID: Dwayne Jacobson, male    DOB: 04-19-1955, 65 y.o.   MRN: [id number]  HPI: [name] is a 65 y.o. male presenting on 09/19/2020 for comprehensive medical examination. Current medical complaints include: ears clogged  He currently lives with: wife Interim Problems from his last visit: ears clogged  HYPERTENSION / HYPERLIPIDEMIA Continues on Lisinopril, HCTZ, Atorvastatin, and Amlodipine.    Followed by Dr. Estanislado Pandy for SLE, taking Plaquenil daily with last visit with them on 08/29/20.  Has history of a DVT and continues on Xarelto without issue. Satisfied with current treatment? yes Duration of hypertension: chronic BP monitoring frequency: not checking BP range: not checking BP medication side effects: no Duration of hyperlipidemia: chronic Cholesterol medication side effects: no Cholesterol supplements: none Medication compliance: good compliance Aspirin: no Recent stressors: no Recurrent headaches: no Visual changes: no Palpitations: no Dyspnea: no Chest pain: no Lower extremity edema: no Dizzy/lightheaded: no   GERD Continues on Protonix 40 MG daily without issue. GERD control status: stable  Satisfied with current treatment? yes Heartburn frequency: none Medication side effects: no  Medication compliance: stable Dysphagia: no Odynophagia:  no Hematemesis: no Blood in stool: no EGD: no  EAR PRESSURE Over past weeks the ears have felt clogged up.  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  Duration: weeks Involved ear(s): bilateral R>L Fever: no Otorrhea: no Upper respiratory infection symptoms: no Pruritus: yes Hearing loss: yes Water immersion no Using Q-tips: yes Recurrent otitis media: no Status: worse Treatments  attempted: none   Functional Status Survey: Is the patient deaf or have difficulty hearing?: Yes Does the patient have difficulty seeing, even when wearing glasses/contacts?: No Does the patient have difficulty concentrating, remembering, or making decisions?: No Does the patient have difficulty walking or climbing stairs?: No Does the patient have difficulty dressing or bathing?: No Does the patient have difficulty doing errands alone such as visiting a doctor's office or shopping?: No  FALL RISK: Fall Risk  09/19/2020 03/21/2020 08/17/2019 07/14/2018 05/12/2018  Falls in the past year? 0 0 0 0 0  Number falls in past yr: 0 - 0 - -  Injury with Fall? 0 - 0 - -  Risk for fall due to : No Fall Risks - No Fall Risks - -  Follow up Falls evaluation completed - Falls prevention discussed Falls evaluation completed Falls evaluation completed    Depression Screen Depression screen [facility] 2/9 09/19/2020 03/21/2020 08/17/2019 11/07/2018 09/30/2017  Decreased Interest 0 0 0 0 0  Down, Depressed, Hopeless 0 0 0 0 0  PHQ - 2 Score 0 0 0 0 0  Altered sleeping - - 0 - 3  Tired, decreased energy - - 2 - 3  Change in appetite - - 0 - 0  Feeling bad or failure about yourself  - - 0 - 0  Trouble concentrating - - 0 - 0  Moving slowly or fidgety/restless - - 0 - 0  Suicidal thoughts - - 0 - 0  PHQ-9 Score - - 2 - 6  Difficult doing work/chores - - Not difficult at all - -    Advanced Directives <no information>  Past Medical History:  Past Medical History:  Diagnosis Date   DVT (deep venous thrombosis) (HCC)    Hyperlipidemia    Hypertension    Seizures ([place])    Skin cancer    Systemic lupus erythematosus ([place])     Surgical History:  Past Surgical History:  Procedure Laterality Date   NO PAST SURGERIES      Medications:  Current Outpatient Medications on File Prior to Visit  Medication Sig   clobetasol (TEMOVATE) 0.05 % external solution Apply topically as needed.   cyclobenzaprine  (FLEXERIL) 10 MG tablet Take 10 mg by mouth 3 (three) times daily as needed.    hydroxychloroquine (PLAQUENIL) 200 MG tablet Take 200 mg by mouth daily.   Multiple Vitamin (MULTIVITAMIN) tablet Take 1 tablet by mouth daily.   olopatadine (PATANOL) 0.1 % ophthalmic solution olopatadine 0.1 % eye drops   No current facility-administered medications on file prior to visit.    Allergies:  No Known Allergies  Social History:  Social History   Socioeconomic History   Marital status: Married    Spouse name: Not on file   Number of children: Not on file   Years of education: Not on file   Highest education level: Not on file  Occupational History   Not on file  Tobacco Use   Smoking status: Former    Packs/day: 1.50    Years: 40.00    Pack years: 60.00    Types: Cigarettes    Quit date: 08/30/2013    Years since quitting: 7.0   Smokeless tobacco: Former    Types: Nurse, children's Use: Every day   Substances: Nicotine  Substance and Sexual Activity   Alcohol use: Yes    Alcohol/week: 8.0 - 10.0 standard drinks    Types: 8 - 10 Cans of beer per week   Drug use: Never   Sexual activity: Yes  Other Topics Concern   Not on file  Social History Narrative   Not on file   Social Determinants of Health   Financial Resource Strain: Low Risk    Difficulty of Paying Living Expenses: Not hard at all  Food Insecurity: No Food Insecurity   Worried About Charity fundraiser in the Last Year: Never true   [place] in the Last Year: Never true  Transportation Needs: No Transportation Needs   Lack of Transportation (Medical): No   Lack of Transportation (Non-Medical): No  Physical Activity: Sufficiently Active   Days of Exercise per Week: 5 days   Minutes of Exercise per Session: 30 min  Stress: No Stress Concern Present   Feeling of Stress : Only a little  Social Connections: Moderately Isolated   Frequency of Communication with Friends and Family: More than three  times a week   Frequency of Social Gatherings with Friends and Family: More than three times a week   Attends Religious Services: Never   Marine scientist or Organizations: No   Attends Archivist Meetings: Never   Marital Status: Married  Human resources officer Violence: Not At Risk   Fear of Current or Ex-Partner: No   Emotionally Abused: No   Physically Abused: No   Sexually Abused: No   Social History   Tobacco Use  Smoking Status Former   Packs/day: 1.50   Years: 40.00   Pack years: 60.00   Types: Cigarettes   Quit date: 08/30/2013   Years since quitting: 7.0  Smokeless Tobacco  Former   Types: Loss adjuster, chartered   Social History   Substance and Sexual Activity  Alcohol Use Yes   Alcohol/week: 8.0 - 10.0 standard drinks   Types: 8 - 10 Cans of beer per week    Family History:  Family History  Problem Relation Age of Onset   Diabetes Mother    Hypertension Mother    Hyperlipidemia Mother    Stroke Mother    Lung disease Mother    Heart disease Mother    Heart disease Father    Lung disease Father    Diabetes Sister    Colon polyps Maternal Grandmother     Past medical history, surgical history, medications, allergies, family history and social history reviewed with patient today and changes made to appropriate areas of the chart.   Review of Systems - negative All other ROS negative except what is listed above and in the HPI.      Objective:    BP 126/78 (BP Location: Left Arm)   Pulse 63   Temp 98 F (36.7 C)   Ht 5' 10.79" (1.798 m)   Wt 191 lb (86.6 kg)   SpO2 98%   BMI 26.80 kg/m   Wt Readings from Last 3 Encounters:  09/19/20 191 lb (86.6 kg)  08/29/20 193 lb (87.5 kg)  06/06/20 198 lb 6.4 oz (90 kg)    Physical Exam Vitals and nursing note reviewed.  Constitutional:      General: He is awake. He is not in acute distress.    Appearance: He is well-developed and well-groomed. He is not ill-appearing.  HENT:     Head: Normocephalic and  atraumatic.     Right Ear: Hearing, ear canal and external ear normal. No drainage. A middle ear effusion is present. There is impacted cerumen.     Left Ear: Hearing, ear canal and external ear normal. No drainage. A middle ear effusion is present.     Nose: Nose normal.     Mouth/Throat:     Pharynx: Uvula midline.  Eyes:     General: Lids are normal.        Right eye: No discharge.        Left eye: No discharge.     Extraocular Movements: Extraocular movements intact.     Conjunctiva/sclera: Conjunctivae normal.     Pupils: Pupils are equal, round, and reactive to light.     Visual Fields: Right eye visual fields normal and left eye visual fields normal.  Neck:     Thyroid: No thyromegaly.     Vascular: No carotid bruit or JVD.     Trachea: Trachea normal.  Cardiovascular:     Rate and Rhythm: Normal rate and regular rhythm.     Heart sounds: Normal heart sounds, S1 normal and S2 normal. No murmur heard.   No gallop.  Pulmonary:     Effort: Pulmonary effort is normal. No accessory muscle usage or respiratory distress.     Breath sounds: Normal breath sounds.  Abdominal:     General: Bowel sounds are normal.     Palpations: Abdomen is soft. There is no hepatomegaly or splenomegaly.     Tenderness: There is no abdominal tenderness.  Musculoskeletal:        General: Normal range of motion.     Cervical back: Normal range of motion and neck supple.     Right lower leg: No edema.     Left lower leg: No edema.  Lymphadenopathy:  Head:     Right side of head: No submental, submandibular, tonsillar, preauricular or posterior auricular adenopathy.     Left side of head: No submental, submandibular, tonsillar, preauricular or posterior auricular adenopathy.     Cervical: No cervical adenopathy.  Skin:    General: Skin is warm and dry.     Capillary Refill: Capillary refill takes less than 2 seconds.     Findings: No rash.  Neurological:     Mental Status: He is alert and  oriented to person, place, and time.     Cranial Nerves: Cranial nerves are intact.     Gait: Gait is intact.     Deep Tendon Reflexes: Reflexes are normal and symmetric.     Reflex Scores:      Brachioradialis reflexes are 2+ on the right side and 2+ on the left side.      Patellar reflexes are 2+ on the right side and 2+ on the left side. Psychiatric:        Attention and Perception: Attention normal.        Mood and Affect: Mood normal.        Speech: Speech normal.        Behavior: Behavior normal. Behavior is cooperative.        Thought Content: Thought content normal.        Cognition and Memory: Cognition normal.        Judgment: Judgment normal.   Results for orders placed or performed in visit on 08/29/20  Urinalysis, Routine w reflex microscopic  Result Value Ref Range   Color, Urine YELLOW YELLOW   APPearance CLEAR CLEAR   Specific Gravity, Urine 1.015 1.001 - 1.035   pH 5.5 5.0 - 8.0   Glucose, UA NEGATIVE NEGATIVE   Bilirubin Urine NEGATIVE NEGATIVE   Ketones, ur NEGATIVE NEGATIVE   Hgb urine dipstick NEGATIVE NEGATIVE   Protein, ur NEGATIVE NEGATIVE   Nitrite NEGATIVE NEGATIVE   Leukocytes,Ua NEGATIVE NEGATIVE  Anti-DNA antibody, double-stranded  Result Value Ref Range   ds DNA Ab 8 (H) IU/mL  C3 and C4  Result Value Ref Range   C3 Complement 149 82 - 185 mg/dL   C4 Complement 29 15 - 53 mg/dL  Sedimentation rate  Result Value Ref Range   Sed Rate 14 0 - 20 mm/h  Lupus Anticoagulant Eval w/Reflex  Result Value Ref Range   Lupus Anticoagulant see note    PTT-LA Screen 54 (H) <=40 sec   dRVVT 82 (H) <=45 sec   PT, Mixing Interp Not Indicated   rflx hexagonal Phase Confirm  Result Value Ref Range   Hexagonal Phase Conf Negative Negative  RFLX DRVVT CONFRIM  Result Value Ref Range   DRVVT CONFIRM Negative Negative      Assessment & Plan:   Problem List Items Addressed This Visit       Cardiovascular and Mediastinum   Hypertension    Chronic,  stable with BP below goal.  Continue current medication regimen and adjust as needed.  Recommend he monitor BP at least weekly at home and document + focus on DASH diet.  CMP and TSH today.  Refills in.  Return in 6 months.      Relevant Medications   atorvastatin (LIPITOR) 10 MG tablet   amLODipine (NORVASC) 5 MG tablet   hydrochlorothiazide (HYDRODIURIL) 25 MG tablet   lisinopril (ZESTRIL) 20 MG tablet   rivaroxaban (XARELTO) 20 MG TABS tablet   Other Relevant Orders  CBC with Differential/Platelet   Comprehensive metabolic panel   TSH     Digestive   Gastroesophageal reflux disease without esophagitis    Chronic, stable with Protonix.  Continue current medication regimen and consider reduction in future.  Mag level annually.      Relevant Medications   pantoprazole (PROTONIX) 40 MG tablet   Other Relevant Orders   Magnesium     Nervous and Auditory   Cerumen impaction    R>L, minimal to left and able to view TM.  Nursing irrigated both ears without issue.  Clear post procedure and tolerated well.  Return for worsening or ongoing issues.        Other   History of DVT (deep vein thrombosis)    Chronic, stable on Eliquis.  Had hematology consult in 2020 and was able to transition to Eliquis, which he has taken without issue and has offered benefit to lifestyle.  CBC  and CMP today.      Hyperlipidemia    Chronic, ongoing.  Continue current medication regimen and adjust as needed.  Lipid panel today.      Relevant Medications   atorvastatin (LIPITOR) 10 MG tablet   amLODipine (NORVASC) 5 MG tablet   hydrochlorothiazide (HYDRODIURIL) 25 MG tablet   lisinopril (ZESTRIL) 20 MG tablet   rivaroxaban (XARELTO) 20 MG TABS tablet   Other Relevant Orders   Comprehensive metabolic panel   Lipid Panel w/o Chol/HDL Ratio   SLE (systemic lupus erythematosus) ([place]) - Primary    Will continue collaboration with his rheumatologist Dr. Estanislado Pandy.  Continue current medication regimen  as prescribed by them.  Recent note and labs reviewed.      Healthcare maintenance    Reviewed health maintenance with patient today: - Cologuard up to date, due next 04/27/2023 - Tetanus up to date, due next 08/27/2026 - Shingrix = refuses today - Covid vaccines = refuses today - AAA screening = is due and qualifies, education provided and will think about this - PCV13 = age 88 and due today, educated on this, wishes to get next visit.      Other Visit Diagnoses     Benign prostatic hyperplasia without lower urinary tract symptoms       PSA check today   Relevant Orders   PSA   Eustachian tube dysfunction, bilateral       Able to view both TM well after cleaning and effusion noted. Will send in Prednisone 40 MG daily for 5 days.  If ongoing symptoms return to office.   Annual physical exam       Annual physical with labs today and health maintenance reviewed with patient.       Discussed aspirin prophylaxis for myocardial infarction prevention and decision was it was not indicated  LABORATORY TESTING:  Health maintenance labs ordered today as discussed above.   The natural history of prostate cancer and ongoing controversy regarding screening and potential treatment outcomes of prostate cancer has been discussed with the patient. The meaning of a false positive PSA and a false negative PSA has been discussed. He indicates understanding of the limitations of this screening test and wishes to proceed with screening PSA testing.   IMMUNIZATIONS:   - Tdap: Tetanus vaccination status reviewed: last tetanus booster within 10 years -- due next 08/27/2026 - Influenza: Up to date - Pneumovax: Has one in past for prevention - Prevnar: due today since age 66, wishes to wait until next visit - Zostavax vaccine: Refused  SCREENING: -  Colonoscopy: Up to date -- Cologuard due next 04/27/2023 Discussed with patient purpose of the colonoscopy is to detect colon cancer at curable precancerous  or early stages   - AAA Screening: educated on and wishes to read more about -Hearing Test: Not applicable  -Spirometry: Not applicable   PATIENT COUNSELING:    Sexuality: Discussed sexually transmitted diseases, partner selection, use of condoms, avoidance of unintended pregnancy  and contraceptive alternatives.   Advised to avoid cigarette smoking.  I discussed with the patient that most people either abstain from alcohol or drink within safe limits (<=14/week and <=4 drinks/occasion for males, <=7/weeks and <= 3 drinks/occasion for females) and that the risk for alcohol disorders and other health effects rises proportionally with the number of drinks per week and how often a drinker exceeds daily limits.  Discussed cessation/primary prevention of drug use and availability of treatment for abuse.   Diet: Encouraged to adjust caloric intake to maintain  or achieve ideal body weight, to reduce intake of dietary saturated fat and total fat, to limit sodium intake by avoiding high sodium foods and not adding table salt, and to maintain adequate dietary potassium and calcium preferably from fresh fruits, vegetables, and low-fat dairy products.    Stressed the importance of regular exercise  Injury prevention: Discussed safety belts, safety helmets, smoke detector, smoking near bedding or upholstery.   Dental health: Discussed importance of regular tooth brushing, flossing, and dental visits.   Follow up plan: NEXT PREVENTATIVE PHYSICAL DUE IN 1 YEAR. Return in about 6 months (around 03/22/2021) for SLE, HTN/HLD, GERD.

## 2020-09-20 LAB — CBC WITH DIFFERENTIAL/PLATELET
Basophils Absolute: 0 10*3/uL (ref 0.0–0.2)
Basos: 1 %
EOS (ABSOLUTE): 0.1 10*3/uL (ref 0.0–0.4)
Eos: 2 %
Hematocrit: 41.6 % (ref 37.5–51.0)
Hemoglobin: 14.1 g/dL (ref 13.0–17.7)
Immature Grans (Abs): 0 10*3/uL (ref 0.0–0.1)
Immature Granulocytes: 0 %
Lymphocytes Absolute: 1 10*3/uL (ref 0.7–3.1)
Lymphs: 22 %
MCH: 30.5 pg (ref 26.6–33.0)
MCHC: 33.9 g/dL (ref 31.5–35.7)
MCV: 90 fL (ref 79–97)
Monocytes Absolute: 0.7 10*3/uL (ref 0.1–0.9)
Monocytes: 15 %
Neutrophils Absolute: 2.8 10*3/uL (ref 1.4–7.0)
Neutrophils: 60 %
Platelets: 297 10*3/uL (ref 150–450)
RBC: 4.63 x10E6/uL (ref 4.14–5.80)
RDW: 13.2 % (ref 11.6–15.4)
WBC: 4.6 10*3/uL (ref 3.4–10.8)

## 2020-09-20 LAB — COMPREHENSIVE METABOLIC PANEL
ALT: 18 IU/L (ref 0–44)
AST: 24 IU/L (ref 0–40)
Albumin/Globulin Ratio: 1.8 (ref 1.2–2.2)
Albumin: 4.9 g/dL — ABNORMAL HIGH (ref 3.8–4.8)
Alkaline Phosphatase: 82 IU/L (ref 44–121)
BUN/Creatinine Ratio: 15 (ref 10–24)
BUN: 12 mg/dL (ref 8–27)
Bilirubin Total: 0.3 mg/dL (ref 0.0–1.2)
CO2: 24 mmol/L (ref 20–29)
Calcium: 10 mg/dL (ref 8.6–10.2)
Chloride: 94 mmol/L — ABNORMAL LOW (ref 96–106)
Creatinine, Ser: 0.8 mg/dL (ref 0.76–1.27)
Globulin, Total: 2.8 g/dL (ref 1.5–4.5)
Glucose: 103 mg/dL — ABNORMAL HIGH (ref 65–99)
Potassium: 4.5 mmol/L (ref 3.5–5.2)
Sodium: 135 mmol/L (ref 134–144)
Total Protein: 7.7 g/dL (ref 6.0–8.5)
eGFR: 98 mL/min/{1.73_m2} (ref >59)

## 2020-09-20 LAB — LIPID PANEL W/O CHOL/HDL RATIO
Cholesterol, Total: 142 mg/dL (ref 100–199)
HDL: 49 mg/dL (ref >39)
LDL Chol Calc (NIH): 79 mg/dL (ref 0–99)
Triglycerides: 70 mg/dL (ref 0–149)
VLDL Cholesterol Cal: 14 mg/dL (ref 5–40)

## 2020-09-20 LAB — MAGNESIUM: Magnesium: 2 mg/dL (ref 1.6–2.3)

## 2020-09-20 LAB — TSH: TSH: 1.58 u[IU]/mL (ref 0.450–4.500)

## 2020-09-20 LAB — PSA: Prostate Specific Ag, Serum: 0.9 ng/mL (ref 0.0–4.0)

## 2020-09-20 NOTE — Progress Notes (Signed)
Contacted via [place] morning Ay[place], your labs have returned and overall look reassuring.  No concerns on these.  Continue all current medications.  Any questions? Keep being stellar!!  Thank you for allowing me to participate in your care.  I appreciate you. Kindest regards, Sueo Cullen

## 2020-10-31 ENCOUNTER — Ambulatory Visit: Payer: No Typology Code available for payment source | Admitting: Nurse Practitioner

## 2020-10-31 ENCOUNTER — Other Ambulatory Visit: Payer: Self-pay

## 2020-10-31 ENCOUNTER — Encounter: Payer: Self-pay | Admitting: Nurse Practitioner

## 2020-10-31 VITALS — BP 129/81 | HR 58 | Ht 70.0 in | Wt 191.0 lb

## 2020-10-31 DIAGNOSIS — J359 Chronic disease of tonsils and adenoids, unspecified: Secondary | ICD-10-CM

## 2020-10-31 DIAGNOSIS — D1800 Hemangioma unspecified site: Secondary | ICD-10-CM | POA: Insufficient documentation

## 2020-10-31 DIAGNOSIS — H6501 Acute serous otitis media, right ear: Secondary | ICD-10-CM

## 2020-10-31 DIAGNOSIS — Z720 Tobacco use: Secondary | ICD-10-CM | POA: Diagnosis not present

## 2020-10-31 DIAGNOSIS — H6690 Otitis media, unspecified, unspecified ear: Secondary | ICD-10-CM | POA: Insufficient documentation

## 2020-10-31 MED ORDER — CIPROFLOXACIN-DEXAMETHASONE 0.3-0.1 % OT SUSP: 4.0000 [drp] | Freq: Two times a day (BID) | OTIC | 0 refills | Status: DC

## 2020-10-31 MED ORDER — AMOXICILLIN-POT CLAVULANATE 875-125 MG PO TABS: 1.0000 | ORAL_TABLET | Freq: Two times a day (BID) | ORAL | 0 refills | Status: DC

## 2020-10-31 NOTE — Assessment & Plan Note (Signed)
I have recommended complete cessation of tobacco use. I have discussed various options available for assistance with tobacco cessation including over the counter methods (Nicotine gum, patch and lozenges). We also discussed prescription options (Chantix, Nicotine Inhaler / Nasal Spray). The patient is not interested in pursuing any prescription tobacco cessation options at this time.  Referral for lung screening ordered.  

## 2020-10-31 NOTE — Progress Notes (Signed)
BP 129/81   Pulse (!) 58   Ht 5' 10" (1.778 m)   Wt 191 lb (86.6 kg)   BMI 27.41 kg/m    Subjective:    Patient ID: Dwayne Jacobson, male    DOB: 1955-10-18, 65 y.o.   MRN: [id number]  HPI: Dwayne Jacobson is a 65 y.o. male  Chief Complaint  Patient presents with   Ear Fullness    Right side   EAR FULLNESS RIGHT SIDE Started about 3-4 days ago.  Was treated with Prednisone for similar in August.  Is a vapor -- vapes nicotine, prior to this did smoke cigarettes, smoked 1 PPD, smoked since teen years. Duration: days Involved ear(s): right Severity:  5/10  Quality:  dull, aching, and throbbing Fever: no Otorrhea: no Upper respiratory infection symptoms: no Pruritus: yes Hearing loss:  trouble hearing Water immersion no Using Q-tips:  occasionally Recurrent otitis media: no Status: fluctuating Treatments attempted: none   Relevant past medical, surgical, family and social history reviewed and updated as indicated. Interim medical history since our last visit reviewed. Allergies and medications reviewed and updated.  Review of Systems  Constitutional:  Negative for activity change, diaphoresis, fatigue and fever.  HENT:  Positive for ear discharge, ear pain and hearing loss. Negative for congestion, facial swelling, postnasal drip, rhinorrhea, sore throat and voice change.   Respiratory:  Negative for cough, chest tightness, shortness of breath and wheezing.   Cardiovascular:  Negative for chest pain, palpitations and leg swelling.  Neurological: Negative.   Psychiatric/Behavioral: Negative.     Per HPI unless specifically indicated above     Objective:    BP 129/81   Pulse (!) 58   Ht 5' 10" (1.778 m)   Wt 191 lb (86.6 kg)   BMI 27.41 kg/m   Wt Readings from Last 3 Encounters:  10/31/20 191 lb (86.6 kg)  09/19/20 191 lb (86.6 kg)  08/29/20 193 lb (87.5 kg)    Physical Exam Vitals and nursing note reviewed.  Constitutional:      General: He is awake. He  is not in acute distress.    Appearance: He is well-developed and well-groomed. He is obese. He is not ill-appearing or toxic-appearing.  HENT:     Head: Normocephalic and atraumatic.     Right Ear: Decreased hearing noted. Swelling and tenderness present. No drainage. A middle ear effusion is present. Tympanic membrane is erythematous and bulging. Tympanic membrane is not perforated.     Left Ear: Hearing, tympanic membrane, ear canal and external ear normal. No decreased hearing noted. No drainage.     Ears:     Comments: Swelling to canal of right ear and tenderness.    Nose: Nose normal.     Right Sinus: No maxillary sinus tenderness or frontal sinus tenderness.     Left Sinus: No maxillary sinus tenderness or frontal sinus tenderness.     Mouth/Throat:     Lips: Pink.     Mouth: Mucous membranes are moist.     Tongue: No lesions.     Pharynx: Posterior oropharyngeal erythema (mild with cobblestone appearance) present. No oropharyngeal exudate.     Tonsils: No tonsillar exudate. 1+ on the right. 1+ on the left.   Eyes:     General: Lids are normal.        Right eye: No discharge.        Left eye: No discharge.     Conjunctiva/sclera: Conjunctivae normal.  Pupils: Pupils are equal, round, and reactive to light.  Neck:     Thyroid: No thyromegaly.     Vascular: No carotid bruit.     Trachea: Trachea normal.  Cardiovascular:     Rate and Rhythm: Normal rate and regular rhythm.     Heart sounds: Normal heart sounds, S1 normal and S2 normal. No murmur heard.   No gallop.  Pulmonary:     Effort: Pulmonary effort is normal. No accessory muscle usage or respiratory distress.     Breath sounds: Normal breath sounds.  Abdominal:     General: Bowel sounds are normal.     Palpations: Abdomen is soft. There is no hepatomegaly or splenomegaly.  Musculoskeletal:        General: Normal range of motion.     Cervical back: Normal range of motion and neck supple.     Right lower leg: No  edema.     Left lower leg: No edema.  Lymphadenopathy:     Head:     Right side of head: No submental, submandibular, tonsillar, preauricular or posterior auricular adenopathy.     Left side of head: No submental, submandibular, tonsillar, preauricular or posterior auricular adenopathy.  Skin:    General: Skin is warm and dry.     Capillary Refill: Capillary refill takes less than 2 seconds.  Neurological:     Mental Status: He is alert and oriented to person, place, and time.     Deep Tendon Reflexes:     Reflex Scores:      Brachioradialis reflexes are 1+ on the right side and 1+ on the left side.      Patellar reflexes are 1+ on the right side and 1+ on the left side. Psychiatric:        Attention and Perception: Attention normal.        Mood and Affect: Mood normal.        Speech: Speech normal.        Behavior: Behavior normal. Behavior is cooperative.        Thought Content: Thought content normal.   Results for orders placed or performed in visit on 09/19/20  CBC with Differential/Platelet  Result Value Ref Range   WBC 4.6 3.4 - 10.8 x10E3/uL   RBC 4.63 4.14 - 5.80 x10E6/uL   Hemoglobin 14.1 13.0 - 17.7 g/dL   Hematocrit 41.6 37.5 - 51.0 %   MCV 90 79 - 97 fL   MCH 30.5 26.6 - 33.0 pg   MCHC 33.9 31.5 - 35.7 g/dL   RDW 13.2 11.6 - 15.4 %   Platelets 297 150 - 450 x10E3/uL   Neutrophils 60 Not Estab. %   Lymphs 22 Not Estab. %   Monocytes 15 Not Estab. %   Eos 2 Not Estab. %   Basos 1 Not Estab. %   Neutrophils Absolute 2.8 1.4 - 7.0 x10E3/uL   Lymphocytes Absolute 1.0 0.7 - 3.1 x10E3/uL   Monocytes Absolute 0.7 0.1 - 0.9 x10E3/uL   EOS (ABSOLUTE) 0.1 0.0 - 0.4 x10E3/uL   Basophils Absolute 0.0 0.0 - 0.2 x10E3/uL   Immature Granulocytes 0 Not Estab. %   Immature Grans (Abs) 0.0 0.0 - 0.1 x10E3/uL  Comprehensive metabolic panel  Result Value Ref Range   Glucose 103 (H) 65 - 99 mg/dL   BUN 12 8 - 27 mg/dL   Creatinine, Ser 0.80 0.76 - 1.27 mg/dL   eGFR 98 >59  mL/min/1.73   BUN/Creatinine Ratio 15 10 -  24   Sodium 135 134 - 144 mmol/L   Potassium 4.5 3.5 - 5.2 mmol/L   Chloride 94 (L) 96 - 106 mmol/L   CO2 24 20 - 29 mmol/L   Calcium 10.0 8.6 - 10.2 mg/dL   Total Protein 7.7 6.0 - 8.5 g/dL   Albumin 4.9 (H) 3.8 - 4.8 g/dL   Globulin, Total 2.8 1.5 - 4.5 g/dL   Albumin/Globulin Ratio 1.8 1.2 - 2.2   Bilirubin Total 0.3 0.0 - 1.2 mg/dL   Alkaline Phosphatase 82 44 - 121 IU/L   AST 24 0 - 40 IU/L   ALT 18 0 - 44 IU/L  Lipid Panel w/o Chol/HDL Ratio  Result Value Ref Range   Cholesterol, Total 142 100 - 199 mg/dL   Triglycerides 70 0 - 149 mg/dL   HDL 49 >39 mg/dL   VLDL Cholesterol Cal 14 5 - 40 mg/dL   LDL Chol Calc (NIH) 79 0 - 99 mg/dL  TSH  Result Value Ref Range   TSH 1.580 0.450 - 4.500 uIU/mL  PSA  Result Value Ref Range   Prostate Specific Ag, Serum 0.9 0.0 - 4.0 ng/mL  Magnesium  Result Value Ref Range   Magnesium 2.0 1.6 - 2.3 mg/dL      Assessment & Plan:   Problem List Items Addressed This Visit       Respiratory   Lesion of tonsil    To right side -- new finding.  Will place referral to ENT for further evaluation, as higher risk patient due to nicotine use.  Discussed at length with patient.      Relevant Orders   Ambulatory referral to ENT     Nervous and Auditory   Otitis media - Primary    Acute for 3-4 days with intact TM and swelling present to canal.  At this time will send in Augmentin to treat systemically and Ciprodex drops to assist with canal.  Recommend avoiding Q Tips.  May take Tylenol as needed for pain.  Plan for return in 4 weeks to follow-up for ear and tonsillar exam.      Relevant Medications   amoxicillin-clavulanate (AUGMENTIN) 875-125 MG tablet     Other   Current every day nicotine vaping    I have recommended complete cessation of tobacco use. I have discussed various options available for assistance with tobacco cessation including over the counter methods (Nicotine gum, patch and  lozenges). We also discussed prescription options (Chantix, Nicotine Inhaler / Nasal Spray). The patient is not interested in pursuing any prescription tobacco cessation options at this time.  Referral for lung screening ordered.       Relevant Orders   Ambulatory Referral Lung Cancer Screening [facility]     Follow up plan: Return in about 4 weeks (around 11/28/2020) for Ear and throat check.

## 2020-10-31 NOTE — Assessment & Plan Note (Signed)
To right side -- new finding.  Will place referral to ENT for further evaluation, as higher risk patient due to nicotine use.  Discussed at length with patient.

## 2020-10-31 NOTE — Patient Instructions (Signed)

## 2020-10-31 NOTE — Assessment & Plan Note (Signed)
Acute for 3-4 days with intact TM and swelling present to canal.  At this time will send in Augmentin to treat systemically and Ciprodex drops to assist with canal.  Recommend avoiding Q Tips.  May take Tylenol as needed for pain.  Plan for return in 4 weeks to follow-up for ear and tonsillar exam.

## 2020-11-01 ENCOUNTER — Other Ambulatory Visit: Payer: Self-pay | Admitting: Family Medicine

## 2020-11-01 MED ORDER — CIPROFLOXACIN-DEXAMETHASONE 0.3-0.1 % OT SUSP: 4.0000 [drp] | Freq: Two times a day (BID) | OTIC | 0 refills | Status: AC

## 2020-11-01 MED ORDER — AMOXICILLIN-POT CLAVULANATE 875-125 MG PO TABS: 1.0000 | ORAL_TABLET | Freq: Two times a day (BID) | ORAL | 0 refills | Status: AC

## 2020-11-03 ENCOUNTER — Other Ambulatory Visit: Payer: Self-pay | Admitting: Family Medicine

## 2020-11-04 MED ORDER — OFLOXACIN 0.3 % OT SOLN: 5.0000 [drp] | Freq: Every day | OTIC | 0 refills | Status: AC

## 2020-11-04 NOTE — Telephone Encounter (Signed)
Pharmacy comment: Alternative Requested:[facility] IS ON BACKORDER. WOULD YOU PLEASE CHANGE TO ANOTHER MED? THANKS!!

## 2020-11-04 NOTE — Addendum Note (Signed)
Addended by: Marnee Guarneri T on: 11/04/2020 12:16 PM   Modules accepted: Orders

## 2020-12-05 ENCOUNTER — Ambulatory Visit: Payer: No Typology Code available for payment source | Admitting: Nurse Practitioner

## 2020-12-05 ENCOUNTER — Other Ambulatory Visit: Payer: Self-pay

## 2020-12-05 ENCOUNTER — Encounter: Payer: Self-pay | Admitting: Nurse Practitioner

## 2020-12-05 VITALS — BP 119/73 | HR 55 | Temp 97.6°F | Ht 70.0 in | Wt 198.4 lb

## 2020-12-05 DIAGNOSIS — J359 Chronic disease of tonsils and adenoids, unspecified: Secondary | ICD-10-CM

## 2020-12-05 DIAGNOSIS — Z23 Encounter for immunization: Secondary | ICD-10-CM | POA: Diagnosis not present

## 2020-12-05 DIAGNOSIS — H6501 Acute serous otitis media, right ear: Secondary | ICD-10-CM

## 2020-12-05 NOTE — Progress Notes (Signed)
BP 119/73   Pulse (!) 55   Temp 97.6 F (36.4 C) (Oral)   Ht '5\' 10"'  (1.778 m)   Wt 198 lb 6.4 oz (90 kg)   SpO2 100%   BMI 28.47 kg/m    Subjective:    Patient ID: Dwayne Jacobson, male    DOB: [birth year], 65 y.o.   MRN: [id number]  HPI: Dwayne Jacobson is a 65 y.o. male  Chief Complaint  Patient presents with   Otalgia   OTITIS MEDIA RIGHT EAR Follow-up for otitis media treated with Augmentin and ear drops on 10/31/20 -- he was also noted to have a lesion to back of throat on exam that day.  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  He is to return for recheck with them in upcoming months to 3-4 months.  Ear pain has improved and no further pressure or pain in ear.   Is a vapor -- vapes nicotine, prior to this did smoke cigarettes, smoked 1 PPD, smoked since teen years. Duration: days Involved ear(s): right Severity:  none Quality: none Fever: no Otorrhea: no Upper respiratory infection symptoms: no Pruritus: yes Hearing loss: none Water immersion no Using Q-tips:  occasionally Recurrent otitis media: no Status: fluctuating Treatments attempted: none    Relevant past medical, surgical, family and social history reviewed and updated as indicated. Interim medical history since our last visit reviewed. Allergies and medications reviewed and updated.  Review of Systems  Constitutional:  Negative for activity change, diaphoresis, fatigue and fever.  HENT:  Negative for congestion, ear discharge, ear pain, facial swelling, hearing loss, postnasal drip, rhinorrhea, sore throat and voice change.   Respiratory:  Negative for cough, chest tightness, shortness of breath and wheezing.   Cardiovascular:  Negative for chest pain, palpitations and leg swelling.  Neurological: Negative.   Psychiatric/Behavioral: Negative.     Per HPI unless specifically indicated above     Objective:    BP 119/73   Pulse (!) 55   Temp 97.6 F (36.4 C) (Oral)   Ht '5\' 10"'  (1.778 m)    Wt 198 lb 6.4 oz (90 kg)   SpO2 100%   BMI 28.47 kg/m   Wt Readings from Last 3 Encounters:  12/05/20 198 lb 6.4 oz (90 kg)  10/31/20 191 lb (86.6 kg)  09/19/20 191 lb (86.6 kg)    Physical Exam Vitals and nursing note reviewed.  Constitutional:      General: He is awake. He is not in acute distress.    Appearance: He is well-developed and well-groomed. He is obese. He is not ill-appearing or toxic-appearing.  HENT:     Head: Normocephalic and atraumatic.     Right Ear: Hearing, tympanic membrane, ear canal and external ear normal. No drainage. No middle ear effusion.     Left Ear: Hearing, tympanic membrane, ear canal and external ear normal. No decreased hearing noted. No drainage.  No middle ear effusion.     Nose: Nose normal.     Right Sinus: No maxillary sinus tenderness or frontal sinus tenderness.     Left Sinus: No maxillary sinus tenderness or frontal sinus tenderness.     Mouth/Throat:     Lips: Pink.     Mouth: Mucous membranes are moist.     Tongue: No lesions.     Pharynx: Posterior oropharyngeal erythema (mild with cobblestone appearance) present. No oropharyngeal exudate.     Tonsils: No tonsillar exudate.   Eyes:  General: Lids are normal.        Right eye: No discharge.        Left eye: No discharge.     Conjunctiva/sclera: Conjunctivae normal.     Pupils: Pupils are equal, round, and reactive to light.  Neck:     Thyroid: No thyromegaly.     Vascular: No carotid bruit.     Trachea: Trachea normal.  Cardiovascular:     Rate and Rhythm: Normal rate and regular rhythm.     Heart sounds: Normal heart sounds, S1 normal and S2 normal. No murmur heard.   No gallop.  Pulmonary:     Effort: Pulmonary effort is normal. No accessory muscle usage or respiratory distress.     Breath sounds: Normal breath sounds.  Abdominal:     General: Bowel sounds are normal.     Palpations: Abdomen is soft. There is no hepatomegaly or splenomegaly.  Musculoskeletal:         General: Normal range of motion.     Cervical back: Normal range of motion and neck supple.     Right lower leg: No edema.     Left lower leg: No edema.  Lymphadenopathy:     Head:     Right side of head: No submental, submandibular, tonsillar, preauricular or posterior auricular adenopathy.     Left side of head: No submental, submandibular, tonsillar, preauricular or posterior auricular adenopathy.  Skin:    General: Skin is warm and dry.     Capillary Refill: Capillary refill takes less than 2 seconds.  Neurological:     Mental Status: He is alert and oriented to person, place, and time.  Psychiatric:        Attention and Perception: Attention normal.        Mood and Affect: Mood normal.        Speech: Speech normal.        Behavior: Behavior normal. Behavior is cooperative.        Thought Content: Thought content normal.   Results for orders placed or performed in visit on 09/19/20  CBC with Differential/Platelet  Result Value Ref Range   WBC 4.6 3.4 - 10.8 x10E3/uL   RBC 4.63 4.14 - 5.80 x10E6/uL   Hemoglobin 14.1 13.0 - 17.7 g/dL   Hematocrit 41.6 37.5 - 51.0 %   MCV 90 79 - 97 fL   MCH 30.5 26.6 - 33.0 pg   MCHC 33.9 31.5 - 35.7 g/dL   RDW 13.2 11.6 - 15.4 %   Platelets 297 150 - 450 x10E3/uL   Neutrophils 60 Not Estab. %   Lymphs 22 Not Estab. %   Monocytes 15 Not Estab. %   Eos 2 Not Estab. %   Basos 1 Not Estab. %   Neutrophils Absolute 2.8 1.4 - 7.0 x10E3/uL   Lymphocytes Absolute 1.0 0.7 - 3.1 x10E3/uL   Monocytes Absolute 0.7 0.1 - 0.9 x10E3/uL   EOS (ABSOLUTE) 0.1 0.0 - 0.4 x10E3/uL   Basophils Absolute 0.0 0.0 - 0.2 x10E3/uL   Immature Granulocytes 0 Not Estab. %   Immature Grans (Abs) 0.0 0.0 - 0.1 x10E3/uL  Comprehensive metabolic panel  Result Value Ref Range   Glucose 103 (H) 65 - 99 mg/dL   BUN 12 8 - 27 mg/dL   Creatinine, Ser 0.80 0.76 - 1.27 mg/dL   eGFR 98 >59 mL/min/1.73   BUN/Creatinine Ratio 15 10 - 24   Sodium 135 134 - 144 mmol/L    Potassium 4.5  3.5 - 5.2 mmol/L   Chloride 94 (L) 96 - 106 mmol/L   CO2 24 20 - 29 mmol/L   Calcium 10.0 8.6 - 10.2 mg/dL   Total Protein 7.7 6.0 - 8.5 g/dL   Albumin 4.9 (H) 3.8 - 4.8 g/dL   Globulin, Total 2.8 1.5 - 4.5 g/dL   Albumin/Globulin Ratio 1.8 1.2 - 2.2   Bilirubin Total 0.3 0.0 - 1.2 mg/dL   Alkaline Phosphatase 82 44 - 121 IU/L   AST 24 0 - 40 IU/L   ALT 18 0 - 44 IU/L  Lipid Panel w/o Chol/HDL Ratio  Result Value Ref Range   Cholesterol, Total 142 100 - 199 mg/dL   Triglycerides 70 0 - 149 mg/dL   HDL 49 >39 mg/dL   VLDL Cholesterol Cal 14 5 - 40 mg/dL   LDL Chol Calc (NIH) 79 0 - 99 mg/dL  TSH  Result Value Ref Range   TSH 1.580 0.450 - 4.500 uIU/mL  PSA  Result Value Ref Range   Prostate Specific Ag, Serum 0.9 0.0 - 4.0 ng/mL  Magnesium  Result Value Ref Range   Magnesium 2.0 1.6 - 2.3 mg/dL      Assessment & Plan:   Problem List Items Addressed This Visit       Respiratory   Lesion of tonsil - Primary    Saw ENT, benign hemangioma, note reviewed.  Is to return to see them in 3 months.  Overall area improved in appearance today.        Nervous and Auditory   Otitis media    Acute and improving, ear improved in appearance and symptoms improved.  Return to office as needed.      Other Visit Diagnoses     Need for influenza vaccination       Flu vaccine today.   Relevant Orders   Flu Vaccine QUAD High Dose(Fluad)        Follow up plan: Return in about 10 months (around 09/21/2021) for Annual physical.

## 2020-12-05 NOTE — Assessment & Plan Note (Signed)
Acute and improving, ear improved in appearance and symptoms improved.  Return to office as needed.

## 2020-12-05 NOTE — Assessment & Plan Note (Signed)
Saw ENT, benign hemangioma, note reviewed.  Is to return to see them in 3 months.  Overall area improved in appearance today.

## 2020-12-05 NOTE — Patient Instructions (Signed)

## 2020-12-28 ENCOUNTER — Emergency Department: Payer: No Typology Code available for payment source

## 2020-12-28 ENCOUNTER — Encounter: Payer: Self-pay | Admitting: Emergency Medicine

## 2020-12-28 ENCOUNTER — Other Ambulatory Visit: Payer: Self-pay

## 2020-12-28 DIAGNOSIS — Z79899 Other long term (current) drug therapy: Secondary | ICD-10-CM | POA: Diagnosis not present

## 2020-12-28 DIAGNOSIS — Z7901 Long term (current) use of anticoagulants: Secondary | ICD-10-CM | POA: Diagnosis not present

## 2020-12-28 DIAGNOSIS — S0990XA Unspecified injury of head, initial encounter: Secondary | ICD-10-CM | POA: Diagnosis present

## 2020-12-28 DIAGNOSIS — Z8616 Personal history of COVID-19: Secondary | ICD-10-CM | POA: Diagnosis not present

## 2020-12-28 DIAGNOSIS — S0101XA Laceration without foreign body of scalp, initial encounter: Secondary | ICD-10-CM | POA: Insufficient documentation

## 2020-12-28 DIAGNOSIS — Z87891 Personal history of nicotine dependence: Secondary | ICD-10-CM | POA: Insufficient documentation

## 2020-12-28 DIAGNOSIS — I10 Essential (primary) hypertension: Secondary | ICD-10-CM | POA: Insufficient documentation

## 2020-12-28 DIAGNOSIS — Z85828 Personal history of other malignant neoplasm of skin: Secondary | ICD-10-CM | POA: Diagnosis not present
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
## 2020-12-29 ENCOUNTER — Emergency Department
Admission: EM | Admit: 2020-12-29 | Discharge: 2020-12-29 | Disposition: A | Discharge status: Home / Self Care | Payer: No Typology Code available for payment source | Attending: Emergency Medicine | Admitting: Emergency Medicine

## 2020-12-29 DIAGNOSIS — S0101XA Laceration without foreign body of scalp, initial encounter: Secondary | ICD-10-CM

## 2020-12-29 DIAGNOSIS — S0990XA Unspecified injury of head, initial encounter: Secondary | ICD-10-CM

## 2020-12-29 MED ORDER — BACITRACIN ZINC 500 UNIT/GM EX OINT: TOPICAL_OINTMENT | CUTANEOUS | Status: DC

## 2020-12-29 MED ORDER — LIDOCAINE-EPINEPHRINE 2 %-1:100000 IJ SOLN
20.0000 mL | Freq: Once | INTRAMUSCULAR | Status: AC
  Administered 2020-12-29: 01:00:00 20 mL
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
## 2021-01-06 ENCOUNTER — Other Ambulatory Visit: Payer: Self-pay

## 2021-01-06 ENCOUNTER — Ambulatory Visit: Payer: No Typology Code available for payment source | Admitting: Family Medicine

## 2021-01-06 ENCOUNTER — Encounter: Payer: Self-pay | Admitting: Family Medicine

## 2021-01-06 VITALS — BP 130/76 | HR 65 | Temp 98.5°F | Wt 200.2 lb

## 2021-01-06 DIAGNOSIS — Z4802 Encounter for removal of sutures: Secondary | ICD-10-CM | POA: Diagnosis not present

## 2021-01-06 NOTE — Progress Notes (Signed)
Well approximated wound. 11 staples removed without complication. Dermabond applied after sutures removed. No complications. Follow up as scheduled.
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
## 2021-02-06 ENCOUNTER — Ambulatory Visit: Payer: No Typology Code available for payment source | Admitting: Physician Assistant

## 2021-02-06 NOTE — Progress Notes (Signed)
Office Visit Note  Patient: Dwayne Jacobson             Date of Birth: Feb 21, 1955           MRN: [id number]             PCP: Venita Lick, NP Referring: Venita Lick, NP Visit Date: 02/20/2021 Occupation: _0 @  Subjective:  Medication monitoring   History of Present Illness: Dwayne Jacobson is a 66 y.o. male with history of systemic lupus erythematosus, chondrocalcinosis, and osteoarthritis.  He is taking Plaquenil 200 mg 1 tablet by mouth daily.  He is tolerating Plaquenil without any side effects and has not missed any doses recently.  He denies any signs or symptoms of a systemic lupus flare.  He has not noticed any new or worsening symptoms since his last office visit.  His knee joint pain has improved.  He denies any joint swelling currently.  He has not had any morning stiffness, nocturnal pain, or difficulty with ADLs.  He is not experiencing any shortness of breath, pleuritic chest pain, or palpitations.  He continues to have some symptoms of Raynaud's which have been more frequent with cooler weather temperatures.  He denies any digital ulcerations.  He states that he was evaluated at his dermatologist office 1 to 2 weeks ago at which time he was given an ointment to apply to some lesions on his face.  He is unsure if the lesions are due to lupus or something else.  He denies any other rashes recently.  He tries to avoid direct sun exposure.  He continues to have some eye dryness and uses eyedrops every morning but has not had any mouth dryness.  He denies any oral or nasal ulcerations.      Activities of Daily Living:  Patient reports morning stiffness for a few  minutes.  Gets better with moving around Patient Denies nocturnal pain.  Difficulty dressing/grooming: Denies Difficulty climbing stairs: Denies Difficulty getting out of chair: Denies Difficulty using hands for taps, buttons, cutlery, and/or writing: Denies  Review of Systems  Constitutional: Negative.    HENT:  Positive for ear pain.        States has a right ear infection  Eyes: Negative.   Respiratory: Negative.    Cardiovascular: Negative.   Gastrointestinal: Negative.   Endocrine: Negative.   Genitourinary: Negative.   Musculoskeletal:  Positive for morning stiffness.  Skin: Negative.   Allergic/Immunologic:       States right ear infection has gone to ENT has drops   Neurological: Negative.   Hematological: Negative.   Psychiatric/Behavioral: Negative.     PMFS History:  Patient Active Problem List   Diagnosis Date Noted   Lesion of tonsil 10/31/2020   Current every day nicotine vaping 10/31/2020   Otitis media 10/31/2020   Healthcare maintenance 09/19/2020   History of 2019 novel coronavirus disease (COVID-19) 03/15/2020   Gastroesophageal reflux disease without esophagitis 08/17/2019   Chondrocalcinosis 01/13/2017   Primary osteoarthritis of both knees 01/13/2017   History of DVT (deep vein thrombosis)    Hypertension    Hyperlipidemia    SLE (systemic lupus erythematosus) ([place]) 01/18/2014    Past Medical History:  Diagnosis Date   DVT (deep venous thrombosis) (HCC)    Hyperlipidemia    Hypertension    Seizures (HCC)    Skin cancer    Systemic lupus erythematosus ([place])     Family History  Problem Relation Age of Onset  Diabetes Mother    Hypertension Mother    Hyperlipidemia Mother    Stroke Mother    Lung disease Mother    Heart disease Mother    Heart disease Father    Lung disease Father    Diabetes Sister    Colon polyps Maternal Grandmother    Past Surgical History:  Procedure Laterality Date   NO PAST SURGERIES     Social History   Social History Narrative   Not on file   Immunization History  Administered Date(s) Administered   Fluad Quad(high Dose 65+) 12/05/2020   Influenza,inj,Quad PF,6+ Mos 01/03/2015, 11/05/2016, 11/07/2018   Pneumococcal Polysaccharide-23 09/29/2012   Td 03/25/2006, 08/06/2016     Objective: Vital Signs:  BP 136/73    Pulse (!) 59    Ht _0  (1.803 m)    Wt 200 lb (90.7 kg)    BMI 27.89 kg/m    Physical Exam Vitals and nursing note reviewed.  Constitutional:      Appearance: He is well-developed.  HENT:     Head: Normocephalic and atraumatic.  Eyes:     Conjunctiva/sclera: Conjunctivae normal.     Pupils: Pupils are equal, round, and reactive to light.  Cardiovascular:     Rate and Rhythm: Normal rate and regular rhythm.     Heart sounds: Normal heart sounds.  Pulmonary:     Effort: Pulmonary effort is normal.     Breath sounds: Normal breath sounds.  Abdominal:     General: Bowel sounds are normal.     Palpations: Abdomen is soft.  Musculoskeletal:     Cervical back: Normal range of motion and neck supple.  Skin:    General: Skin is warm and dry.     Capillary Refill: Capillary refill takes 2 to 3 seconds.     Comments: Facial erythema noted  Neurological:     Mental Status: He is alert and oriented to person, place, and time.  Psychiatric:        Behavior: Behavior normal.     Musculoskeletal Exam: C-spine has good range of motion with no discomfort.  Postural thoracic kyphosis noted.  Shoulder joints, elbow joints, wrist joints, MCPs, PIPs, DIPs have good range of motion with no synovitis.  PIP and DIP thickening noted.  Complete fist formation bilaterally.  Hip joints have good range of motion with no groin pain.  Knee joints have good range of motion with no warmth or effusion.  Ankle joints have good range of motion with no tenderness or joint swelling.  CDAI Exam: CDAI Score: -- Patient Global: --; Provider Global: -- Swollen: --; Tender: -- Joint Exam 02/20/2021   No joint exam has been documented for this visit   There is currently no information documented on the homunculus. Go to the Rheumatology activity and complete the homunculus joint exam.  Investigation: No additional findings.  Imaging: No results found.  Recent Labs: Lab Results  Component  Value Date   WBC 4.6 09/19/2020   HGB 14.1 09/19/2020   PLT 297 09/19/2020   NA 135 09/19/2020   K 4.5 09/19/2020   CL 94 (L) 09/19/2020   CO2 24 09/19/2020   GLUCOSE 103 (H) 09/19/2020   BUN 12 09/19/2020   CREATININE 0.80 09/19/2020   BILITOT 0.3 09/19/2020   ALKPHOS 82 09/19/2020   AST 24 09/19/2020   ALT 18 09/19/2020   PROT 7.7 09/19/2020   ALBUMIN 4.9 (H) 09/19/2020   CALCIUM 10.0 09/19/2020   GFRAA 103 03/21/2020  Speciality Comments: PLQ Eye Exam: 04/06/2020 WNL @ [facility] Follow up in 1 year  Procedures:  No procedures performed Allergies: Patient has no known allergies.     Assessment / Plan:     Visit Diagnoses: Other systemic lupus erythematosus with other organ involvement ([place]) - Positive dsDNA, positive Ro. History of rash, diagnosed with lupus by Dr. Jefm Bryant many years ago: He has not had any signs or symptoms of a systemic lupus flare recently.  He has clinically been doing well taking Plaquenil 200 mg 1 tablet by mouth daily.  He continues to tolerate Plaquenil without any side effects and has not missed any doses recently.  He is not experiencing any joint pain and has no synovitis on exam.  Facial erythema was noted.  He was evaluated by his dermatologist 1 to 2 weeks ago at which time he was given ointment to apply to several lesions on his face.  He is unsure if the lesions were secondary to lupus.  We will call to obtain the most recent records.  He has been avoiding direct sun exposure as advised.  He has had some increased symptoms of Raynaud's with the cooler weather temperatures.  No digital ulcerations or signs of gangrene were noted.  Delayed capillary refill 2 to 3 seconds noted.  He has not had any oral or nasal ulcerations.  He has some eye dryness and uses eyedrops every morning for symptomatic relief.  He has not had any shortness of breath, pleuritic chest pain or palpitations.  His lungs were clear to auscultation on examination  today. Lab work from 08/29/2020 was reviewed today in the office: Double-stranded DNA was 8, complements within normal limits, ESR within normal limits, lupus anticoagulant was not detected.  The following lab work will be updated today.  He will remain on Plaquenil as prescribed.  He was advised to notify us if he develops signs or symptoms of a flare.  He will follow-up in the office in 5 months.  - Plan: CBC with Differential/Platelet, COMPLETE METABOLIC PANEL WITH GFR, ANA, Anti-DNA antibody, double-stranded, C3 and C4, Sedimentation rate, Urinalysis, Routine w reflex microscopic  High risk medication use - Plaquenil 200 mg 1 tablet by mouth daily-prescribed by Dr. Kirkland Hun (dermatologist).  CBC and CMP were updated on 09/19/2020.  He is due to update lab work today.  Orders for CBC and CMP were released.  PLQ Eye Exam: 04/06/2020 WNL @ [facility] Follow up in 1 year.   - Plan: CBC with Differential/Platelet, COMPLETE METABOLIC PANEL WITH GFR  Primary osteoarthritis of both knees: He has good range of motion of both knee joints on examination today.  No warmth or effusion was noted.  Chondrocalcinosis: He has not had any signs or symptoms of a pseudogout flare.  He is not experiencing any joint pain or inflammation at this time.  Trapezius muscle spasm: He has some trapezius muscle tension and tenderness bilaterally.  He has not been experiencing any muscle spasms recently.  Other medical conditions are listed as follows:  Essential hypertension: Blood pressure is 136/73 today in the office.  Mixed hyperlipidemia  History of DVT (deep vein thrombosis) - Anticardiolipin and beta-2 GP 1 were negative in the past.  Lupus anticoagulant not detected on 08/29/2020.  He was initially treated with Coumadin and is currently on Xarelto.  Orders: Orders Placed This Encounter  Procedures   CBC with Differential/Platelet   COMPLETE METABOLIC PANEL WITH GFR   ANA   Anti-DNA  antibody,  double-stranded   C3 and C4   Sedimentation rate   Urinalysis, Routine w reflex microscopic   No orders of the defined types were placed in this encounter.     Follow-Up Instructions: Return in about 5 months (around 07/21/2021) for Systemic lupus erythematosus, Osteoarthritis.   Ofilia Neas, PA-C  Note - This record has been created using Dragon software.  Chart creation errors have been sought, but may not always  have been located. Such creation errors do not reflect on  the standard of medical care.

## 2021-02-20 ENCOUNTER — Encounter: Payer: Self-pay | Admitting: Physician Assistant

## 2021-02-20 ENCOUNTER — Other Ambulatory Visit: Payer: Self-pay

## 2021-02-20 ENCOUNTER — Ambulatory Visit: Payer: No Typology Code available for payment source | Admitting: Physician Assistant

## 2021-02-20 VITALS — BP 136/73 | HR 59 | Ht 71.0 in | Wt 200.0 lb

## 2021-02-20 DIAGNOSIS — Z86718 Personal history of other venous thrombosis and embolism: Secondary | ICD-10-CM

## 2021-02-20 DIAGNOSIS — M62838 Other muscle spasm: Secondary | ICD-10-CM

## 2021-02-20 DIAGNOSIS — M1120 Other chondrocalcinosis, unspecified site: Secondary | ICD-10-CM | POA: Diagnosis not present

## 2021-02-20 DIAGNOSIS — M3219 Other organ or system involvement in systemic lupus erythematosus: Secondary | ICD-10-CM

## 2021-02-20 DIAGNOSIS — Z79899 Other long term (current) drug therapy: Secondary | ICD-10-CM | POA: Diagnosis not present

## 2021-02-20 DIAGNOSIS — E782 Mixed hyperlipidemia: Secondary | ICD-10-CM

## 2021-02-20 DIAGNOSIS — M170 Bilateral primary osteoarthritis of knee: Secondary | ICD-10-CM

## 2021-02-20 DIAGNOSIS — I10 Essential (primary) hypertension: Secondary | ICD-10-CM

## 2021-02-23 LAB — COMPLETE METABOLIC PANEL WITH GFR
AG Ratio: 1.7 (calc) (ref 1.0–2.5)
ALT: 25 U/L (ref 9–46)
AST: 27 U/L (ref 10–35)
Albumin: 4.8 g/dL (ref 3.6–5.1)
Alkaline phosphatase (APISO): 71 U/L (ref 35–144)
BUN: 11 mg/dL (ref 7–25)
CO2: 33 mmol/L — ABNORMAL HIGH (ref 20–32)
Calcium: 9.7 mg/dL (ref 8.6–10.3)
Chloride: 95 mmol/L — ABNORMAL LOW (ref 98–110)
Creat: 0.86 mg/dL (ref 0.70–1.35)
Globulin: 2.8 g/dL (calc) (ref 1.9–3.7)
Glucose, Bld: 100 mg/dL — ABNORMAL HIGH (ref 65–99)
Potassium: 4.6 mmol/L (ref 3.5–5.3)
Sodium: 133 mmol/L — ABNORMAL LOW (ref 135–146)
Total Bilirubin: 0.4 mg/dL (ref 0.2–1.2)
Total Protein: 7.6 g/dL (ref 6.1–8.1)
eGFR: 96 mL/min/{1.73_m2} (ref >=60)

## 2021-02-23 LAB — URINALYSIS, ROUTINE W REFLEX MICROSCOPIC
Bilirubin Urine: NEGATIVE
Glucose, UA: NEGATIVE
Hgb urine dipstick: NEGATIVE
Ketones, ur: NEGATIVE
Leukocytes,Ua: NEGATIVE
Nitrite: NEGATIVE
Protein, ur: NEGATIVE
Specific Gravity, Urine: 1.008 (ref 1.001–1.035)
pH: 7 (ref 5.0–8.0)

## 2021-02-23 LAB — CBC WITH DIFFERENTIAL/PLATELET
Absolute Monocytes: 717 cells/uL (ref 200–950)
Basophils Absolute: 40 cells/uL (ref 0–200)
Basophils Relative: 0.9 %
Eosinophils Absolute: 88 cells/uL (ref 15–500)
Eosinophils Relative: 2 %
HCT: 41 % (ref 38.5–50.0)
Hemoglobin: 13.9 g/dL (ref 13.2–17.1)
Lymphs Abs: 964 cells/uL (ref 850–3900)
MCH: 31.2 pg (ref 27.0–33.0)
MCHC: 33.9 g/dL (ref 32.0–36.0)
MCV: 91.9 fL (ref 80.0–100.0)
MPV: 9.7 fL (ref 7.5–12.5)
Monocytes Relative: 16.3 %
Neutro Abs: 2592 cells/uL (ref 1500–7800)
Neutrophils Relative %: 58.9 %
Platelets: 296 10*3/uL (ref 140–400)
RBC: 4.46 10*6/uL (ref 4.20–5.80)
RDW: 12.5 % (ref 11.0–15.0)
Total Lymphocyte: 21.9 %
WBC: 4.4 10*3/uL (ref 3.8–10.8)

## 2021-02-23 LAB — SEDIMENTATION RATE: Sed Rate: 6 mm/h (ref 0–20)

## 2021-02-23 LAB — C3 AND C4
C3 Complement: 138 mg/dL (ref 82–185)
C4 Complement: 30 mg/dL (ref 15–53)

## 2021-02-23 LAB — ANTI-DNA ANTIBODY, DOUBLE-STRANDED: ds DNA Ab: 6 IU/mL — ABNORMAL HIGH

## 2021-02-23 LAB — ANA: Anti Nuclear Antibody (ANA): NEGATIVE

## 2021-02-23 NOTE — Progress Notes (Signed)
CMP stable.  CBC WNL.  ESR WNL.  UA normal.  Complements WNL. ANA negative.

## 2021-02-24 NOTE — Progress Notes (Signed)
dsDNA is in indeterminate range-6.  Labs are not consistent with a flare.  No change in therapy recommended at this time.

## 2021-03-15 NOTE — Patient Instructions (Addendum)
- Try taking Protonix every other day, if heart burn recurrence then return to every day.  - Try taking only 1/2 of the Hydrochlorothiazide and we will recheck Sodium level next visit.  DASH Eating Plan DASH stands for Dietary Approaches to Stop Hypertension. The DASH eating plan is a healthy eating plan that has been shown to: Reduce high blood pressure (hypertension). Reduce your risk for type 2 diabetes, heart disease, and stroke. Help with weight loss. What are tips for following this plan? Reading food labels Check food labels for the amount of salt (sodium) per serving. Choose foods with less than 5 percent of the Daily Value of sodium. Generally, foods with less than 300 milligrams (mg) of sodium per serving fit into this eating plan. To find whole grains, look for the word "whole" as the first word in the ingredient list. Shopping Buy products labeled as "low-sodium" or "no salt added." Buy fresh foods. Avoid canned foods and pre-made or frozen meals. Cooking Avoid adding salt when cooking. Use salt-free seasonings or herbs instead of table salt or sea salt. Check with your health care provider or pharmacist before using salt substitutes. Do not fry foods. Cook foods using healthy methods such as baking, boiling, grilling, roasting, and broiling instead. Cook with heart-healthy oils, such as olive, canola, avocado, soybean, or sunflower oil. Meal planning  Eat a balanced diet that includes: 4 or more servings of fruits and 4 or more servings of vegetables each day. Try to fill one-half of your plate with fruits and vegetables. 6-8 servings of whole grains each day. Less than 6 oz (170 g) of lean meat, poultry, or fish each day. A 3-oz (85-g) serving of meat is about the same size as a deck of cards. One egg equals 1 oz (28 g). 2-3 servings of low-fat dairy each day. One serving is 1 cup (237 mL). 1 serving of nuts, seeds, or beans 5 times each week. 2-3 servings of heart-healthy  fats. Healthy fats called omega-3 fatty acids are found in foods such as walnuts, flaxseeds, fortified milks, and eggs. These fats are also found in cold-water fish, such as sardines, salmon, and mackerel. Limit how much you eat of: Canned or prepackaged foods. Food that is high in trans fat, such as some fried foods. Food that is high in saturated fat, such as fatty meat. Desserts and other sweets, sugary drinks, and other foods with added sugar. Full-fat dairy products. Do not salt foods before eating. Do not eat more than 4 egg yolks a week. Try to eat at least 2 vegetarian meals a week. Eat more home-cooked food and less restaurant, buffet, and fast food. Lifestyle When eating at a restaurant, ask that your food be prepared with less salt or no salt, if possible. If you drink alcohol: Limit how much you use to: 0-1 drink a day for women who are not pregnant. 0-2 drinks a day for men. Be aware of how much alcohol is in your drink. In the [place] one drink equals one 12 oz bottle of beer (355 mL), one 5 oz glass of wine (148 mL), or one 1 oz glass of hard liquor (44 mL). General information Avoid eating more than 2,300 mg of salt a day. If you have hypertension, you may need to reduce your sodium intake to 1,500 mg a day. Work with your health care provider to maintain a healthy body weight or to lose weight. Ask what an ideal weight is for you. Get at least  30 minutes of exercise that causes your heart to beat faster (aerobic exercise) most days of the week. Activities may include walking, swimming, or biking. Work with your health care provider or dietitian to adjust your eating plan to your individual calorie needs. What foods should I eat? Fruits All fresh, dried, or frozen fruit. Canned fruit in natural juice (without added sugar). Vegetables Fresh or frozen vegetables (raw, steamed, roasted, or grilled). Low-sodium or reduced-sodium tomato and vegetable juice. Low-sodium or  reduced-sodium tomato sauce and tomato paste. Low-sodium or reduced-sodium canned vegetables. Grains Whole-grain or whole-wheat bread. Whole-grain or whole-wheat pasta. Brown rice. [place]. Bulgur. Whole-grain and low-sodium cereals. Pita bread. Low-fat, low-sodium crackers. Whole-wheat flour tortillas. Meats and other proteins Skinless chicken or [place]. Ground chicken or [place]. Pork with fat trimmed off. Fish and seafood. Egg whites. Dried beans, peas, or lentils. Unsalted nuts, nut butters, and seeds. Unsalted canned beans. Lean cuts of beef with fat trimmed off. Low-sodium, lean precooked or cured meat, such as sausages or meat loaves. Dairy Low-fat (1%) or fat-free (skim) milk. Reduced-fat, low-fat, or fat-free cheeses. Nonfat, low-sodium ricotta or cottage cheese. Low-fat or nonfat yogurt. Low-fat, low-sodium cheese. Fats and oils Soft margarine without trans fats. Vegetable oil. Reduced-fat, low-fat, or light mayonnaise and salad dressings (reduced-sodium). Canola, safflower, olive, avocado, soybean, and sunflower oils. Avocado. Seasonings and condiments Herbs. Spices. Seasoning mixes without salt. Other foods Unsalted popcorn and pretzels. Fat-free sweets. The items listed above may not be a complete list of foods and beverages you can eat. Contact a dietitian for more information. What foods should I avoid? Fruits Canned fruit in a light or heavy syrup. Fried fruit. Fruit in cream or butter sauce. Vegetables Creamed or fried vegetables. Vegetables in a cheese sauce. Regular canned vegetables (not low-sodium or reduced-sodium). Regular canned tomato sauce and paste (not low-sodium or reduced-sodium). Regular tomato and vegetable juice (not low-sodium or reduced-sodium). Angie Fava. Olives. Grains Baked goods made with fat, such as croissants, muffins, or some breads. Dry pasta or rice meal packs. Meats and other proteins Fatty cuts of meat. Ribs. Fried meat. Berniece Salines. Bologna,  salami, and other precooked or cured meats, such as sausages or meat loaves. Fat from the back of a pig (fatback). Bratwurst. Salted nuts and seeds. Canned beans with added salt. Canned or smoked fish. Whole eggs or egg yolks. Chicken or [place] with skin. Dairy Whole or 2% milk, cream, and half-and-half. Whole or full-fat cream cheese. Whole-fat or sweetened yogurt. Full-fat cheese. Nondairy creamers. Whipped toppings. Processed cheese and cheese spreads. Fats and oils Butter. Stick margarine. Lard. Shortening. Ghee. Bacon fat. Tropical oils, such as coconut, palm kernel, or palm oil. Seasonings and condiments Onion salt, garlic salt, seasoned salt, table salt, and sea salt. Worcestershire sauce. Tartar sauce. Barbecue sauce. Teriyaki sauce. Soy sauce, including reduced-sodium. Steak sauce. Canned and packaged gravies. Fish sauce. Oyster sauce. Cocktail sauce. Store-bought horseradish. Ketchup. Mustard. Meat flavorings and tenderizers. Bouillon cubes. Hot sauces. Pre-made or packaged marinades. Pre-made or packaged taco seasonings. Relishes. Regular salad dressings. Other foods Salted popcorn and pretzels. The items listed above may not be a complete list of foods and beverages you should avoid. Contact a dietitian for more information. Where to find more information National Heart, Lung, and Blood Institute: https://wilson-eaton.com/ American Heart Association: www.heart.org Academy of Nutrition and Dietetics: www.eatright.[place]: www.kidney.org Summary The DASH eating plan is a healthy eating plan that has been shown to reduce high blood pressure (hypertension). It may also reduce your risk  for type 2 diabetes, heart disease, and stroke. When on the DASH eating plan, aim to eat more fresh fruits and vegetables, whole grains, lean proteins, low-fat dairy, and heart-healthy fats. With the DASH eating plan, you should limit salt (sodium) intake to 2,300 mg a day. If you have  hypertension, you may need to reduce your sodium intake to 1,500 mg a day. Work with your health care provider or dietitian to adjust your eating plan to your individual calorie needs. This information is not intended to replace advice given to you by your health care provider. Make sure you discuss any questions you have with your health care provider. Document Revised: 12/22/2018 Document Reviewed: 12/22/2018 Elsevier Patient Education  2022 [employer].

## 2021-03-20 ENCOUNTER — Ambulatory Visit: Payer: No Typology Code available for payment source | Admitting: Nurse Practitioner

## 2021-03-20 ENCOUNTER — Encounter: Payer: Self-pay | Admitting: Nurse Practitioner

## 2021-03-20 ENCOUNTER — Other Ambulatory Visit: Payer: Self-pay

## 2021-03-20 VITALS — BP 115/70 | HR 58 | Temp 98.1°F | Ht 71.0 in | Wt 202.4 lb

## 2021-03-20 DIAGNOSIS — K219 Gastro-esophageal reflux disease without esophagitis: Secondary | ICD-10-CM | POA: Diagnosis not present

## 2021-03-20 DIAGNOSIS — Z86718 Personal history of other venous thrombosis and embolism: Secondary | ICD-10-CM | POA: Diagnosis not present

## 2021-03-20 DIAGNOSIS — E782 Mixed hyperlipidemia: Secondary | ICD-10-CM

## 2021-03-20 DIAGNOSIS — I10 Essential (primary) hypertension: Secondary | ICD-10-CM

## 2021-03-20 DIAGNOSIS — M3219 Other organ or system involvement in systemic lupus erythematosus: Secondary | ICD-10-CM | POA: Diagnosis not present

## 2021-03-20 NOTE — Progress Notes (Signed)
BP 115/70    Pulse (!) 58    Temp 98.1 F (36.7 C) (Oral)    Ht '5\' 11"'  (1.803 m)    Wt 202 lb 6.4 oz (91.8 kg)    SpO2 97%    BMI 28.23 kg/m    Subjective:    Patient ID: Dwayne Jacobson, male    DOB: [birth year], 66 y.o.   MRN: [id number]  HPI: Dwayne Jacobson is a 66 y.o. male  Chief Complaint  Patient presents with   Hyperlipidemia   Hypertension   Gastroesophageal Reflux   HYPERTENSION / HYPERLIPIDEMIA Continues on Lisinopril, HCTZ, Atorvastatin, and Amlodipine. Followed by Dr. Estanislado Pandy for SLE, taking Plaquenil daily with last visit with them on 02/20/21.  Has history of a DVT and continues on Xarelto without issue. Satisfied with current treatment? yes Duration of hypertension: chronic BP monitoring frequency: not checking BP range: not checking BP medication side effects: no Duration of hyperlipidemia: chronic Cholesterol medication side effects: no Cholesterol supplements: none Medication compliance: good compliance Aspirin: no Recent stressors: no Recurrent headaches: no Visual changes: no Palpitations: no Dyspnea: no Chest pain: no Lower extremity edema: no Dizzy/lightheaded: no    GERD Continues on Protonix 40 MG daily without issue. GERD control status: stable  Satisfied with current treatment? yes Heartburn frequency: none Medication side effects: no  Medication compliance: stable Dysphagia: no Odynophagia:  no Hematemesis: no Blood in stool: no EGD: no  Relevant past medical, surgical, family and social history reviewed and updated as indicated. Interim medical history since our last visit reviewed. Allergies and medications reviewed and updated.  Review of Systems  Constitutional:  Negative for activity change, diaphoresis, fatigue and fever.  Respiratory:  Negative for cough, chest tightness, shortness of breath and wheezing.   Cardiovascular:  Negative for chest pain, palpitations and leg swelling.  Gastrointestinal: Negative.   Neurological:  Negative.   Psychiatric/Behavioral: Negative.     Per HPI unless specifically indicated above     Objective:    BP 115/70    Pulse (!) 58    Temp 98.1 F (36.7 C) (Oral)    Ht '5\' 11"'  (1.803 m)    Wt 202 lb 6.4 oz (91.8 kg)    SpO2 97%    BMI 28.23 kg/m   Wt Readings from Last 3 Encounters:  03/20/21 202 lb 6.4 oz (91.8 kg)  02/20/21 200 lb (90.7 kg)  01/06/21 200 lb 3.2 oz (90.8 kg)    Physical Exam Vitals and nursing note reviewed.  Constitutional:      General: He is awake. He is not in acute distress.    Appearance: He is well-developed and well-groomed. He is obese. He is not ill-appearing or toxic-appearing.  HENT:     Head: Normocephalic and atraumatic.     Right Ear: Hearing normal. No drainage.     Left Ear: Hearing normal. No drainage.  Eyes:     General: Lids are normal.        Right eye: No discharge.        Left eye: No discharge.     Conjunctiva/sclera: Conjunctivae normal.     Pupils: Pupils are equal, round, and reactive to light.  Neck:     Thyroid: No thyromegaly.     Vascular: No carotid bruit.     Trachea: Trachea normal.  Cardiovascular:     Rate and Rhythm: Normal rate and regular rhythm.     Heart sounds: Normal heart sounds, S1 normal and S2 normal.  No murmur heard.   No gallop.  Pulmonary:     Effort: Pulmonary effort is normal. No accessory muscle usage or respiratory distress.     Breath sounds: Normal breath sounds.  Abdominal:     General: Bowel sounds are normal.     Palpations: Abdomen is soft. There is no hepatomegaly or splenomegaly.  Musculoskeletal:        General: Normal range of motion.     Cervical back: Normal range of motion and neck supple.     Right lower leg: No edema.     Left lower leg: No edema.  Skin:    General: Skin is warm and dry.     Capillary Refill: Capillary refill takes less than 2 seconds.  Neurological:     Mental Status: He is alert and oriented to person, place, and time.     Deep Tendon Reflexes:      Reflex Scores:      Brachioradialis reflexes are 1+ on the right side and 1+ on the left side.      Patellar reflexes are 1+ on the right side and 1+ on the left side. Psychiatric:        Attention and Perception: Attention normal.        Mood and Affect: Mood normal.        Speech: Speech normal.        Behavior: Behavior normal. Behavior is cooperative.        Thought Content: Thought content normal.   Results for orders placed or performed in visit on 02/20/21  CBC with Differential/Platelet  Result Value Ref Range   WBC 4.4 3.8 - 10.8 Thousand/uL   RBC 4.46 4.20 - 5.80 Million/uL   Hemoglobin 13.9 13.2 - 17.1 g/dL   HCT 41.0 38.5 - 50.0 %   MCV 91.9 80.0 - 100.0 fL   MCH 31.2 27.0 - 33.0 pg   MCHC 33.9 32.0 - 36.0 g/dL   RDW 12.5 11.0 - 15.0 %   Platelets 296 140 - 400 Thousand/uL   MPV 9.7 7.5 - 12.5 fL   Neutro Abs 2,592 1,500 - 7,800 cells/uL   Lymphs Abs 964 850 - 3,900 cells/uL   Absolute Monocytes 717 200 - 950 cells/uL   Eosinophils Absolute 88 15 - 500 cells/uL   Basophils Absolute 40 0 - 200 cells/uL   Neutrophils Relative % 58.9 %   Total Lymphocyte 21.9 %   Monocytes Relative 16.3 %   Eosinophils Relative 2.0 %   Basophils Relative 0.9 %  COMPLETE METABOLIC PANEL WITH GFR  Result Value Ref Range   Glucose, Bld 100 (H) 65 - 99 mg/dL   BUN 11 7 - 25 mg/dL   Creat 0.86 0.70 - 1.35 mg/dL   eGFR 96 > OR = 60 mL/min/1.80m   BUN/Creatinine Ratio NOT APPLICABLE 6 - 22 (calc)   Sodium 133 (L) 135 - 146 mmol/L   Potassium 4.6 3.5 - 5.3 mmol/L   Chloride 95 (L) 98 - 110 mmol/L   CO2 33 (H) 20 - 32 mmol/L   Calcium 9.7 8.6 - 10.3 mg/dL   Total Protein 7.6 6.1 - 8.1 g/dL   Albumin 4.8 3.6 - 5.1 g/dL   Globulin 2.8 1.9 - 3.7 g/dL (calc)   AG Ratio 1.7 1.0 - 2.5 (calc)   Total Bilirubin 0.4 0.2 - 1.2 mg/dL   Alkaline phosphatase (APISO) 71 35 - 144 U/L   AST 27 10 - 35 U/L   ALT 25  9 - 46 U/L  ANA  Result Value Ref Range   Anti Nuclear Antibody (ANA) NEGATIVE  NEGATIVE  Anti-DNA antibody, double-stranded  Result Value Ref Range   ds DNA Ab 6 (H) IU/mL  C3 and C4  Result Value Ref Range   C3 Complement 138 82 - 185 mg/dL   C4 Complement 30 15 - 53 mg/dL  Sedimentation rate  Result Value Ref Range   Sed Rate 6 0 - 20 mm/h  Urinalysis, Routine w reflex microscopic  Result Value Ref Range   Color, Urine YELLOW YELLOW   APPearance CLEAR CLEAR   Specific Gravity, Urine 1.008 1.001 - 1.035   pH 7.0 5.0 - 8.0   Glucose, UA NEGATIVE NEGATIVE   Bilirubin Urine NEGATIVE NEGATIVE   Ketones, ur NEGATIVE NEGATIVE   Hgb urine dipstick NEGATIVE NEGATIVE   Protein, ur NEGATIVE NEGATIVE   Nitrite NEGATIVE NEGATIVE   Leukocytes,Ua NEGATIVE NEGATIVE      Assessment & Plan:   Problem List Items Addressed This Visit       Cardiovascular and Mediastinum   Hypertension    Chronic, stable with BP below goal.  Continue current medication regimen, with exception of HCTZ -- recommend he reduce this to 1/2 tablet due to low NA+ recent labs and stable BP, and adjust as needed.  Recommend he monitor BP at least weekly at home and document + focus on DASH diet.  CMP up to date.  Refills up to date. Return in 6 months.        Digestive   Gastroesophageal reflux disease without esophagitis    Chronic, stable with Protonix.  Continue current medication regimen and consider reduction in future.  Mag level annually.        Other   History of DVT (deep vein thrombosis)    Chronic, stable on Eliquis.  Had hematology consult in 2020 and was able to transition to Eliquis, which he has taken without issue and has offered benefit to lifestyle.  CBC  and CMP up to date.      Hyperlipidemia    Chronic, ongoing.  Continue current medication regimen and adjust as needed.  Lipid panel next visit.      SLE (systemic lupus erythematosus) ([place]) - Primary    Will continue collaboration with his rheumatologist Dr. Estanislado Pandy.  Continue current medication regimen as  prescribed by them.  Recent note and labs reviewed.        Follow up plan: Return in about 6 months (around 09/17/2021) for Annual physical.

## 2021-03-20 NOTE — Assessment & Plan Note (Signed)
Chronic, stable on Eliquis.  Had hematology consult in 2020 and was able to transition to Eliquis, which he has taken without issue and has offered benefit to lifestyle.  CBC  and CMP up to date.

## 2021-03-20 NOTE — Assessment & Plan Note (Signed)
Chronic, stable with Protonix.  Continue current medication regimen and consider reduction in future.  Mag level annually.

## 2021-03-20 NOTE — Assessment & Plan Note (Signed)
Chronic, ongoing.  Continue current medication regimen and adjust as needed.  Lipid panel next visit.    

## 2021-03-20 NOTE — Assessment & Plan Note (Signed)
Will continue collaboration with his rheumatologist Dr. Deveshwar.  Continue current medication regimen as prescribed by them.  Recent note and labs reviewed. 

## 2021-03-20 NOTE — Assessment & Plan Note (Addendum)
Chronic, stable with BP below goal.  Continue current medication regimen, with exception of HCTZ -- recommend he reduce this to 1/2 tablet due to low NA+ recent labs and stable BP, and adjust as needed.  Recommend he monitor BP at least weekly at home and document + focus on DASH diet.  CMP up to date.  Refills up to date. Return in 6 months.

## 2021-03-26 ENCOUNTER — Ambulatory Visit: Payer: Self-pay | Admitting: *Deleted

## 2021-03-26 ENCOUNTER — Other Ambulatory Visit: Payer: Self-pay

## 2021-03-26 ENCOUNTER — Ambulatory Visit: Payer: No Typology Code available for payment source | Admitting: Nurse Practitioner

## 2021-03-26 ENCOUNTER — Ambulatory Visit: Payer: No Typology Code available for payment source | Admitting: Internal Medicine

## 2021-03-26 ENCOUNTER — Encounter: Payer: Self-pay | Admitting: Nurse Practitioner

## 2021-03-26 VITALS — BP 109/58 | HR 73 | Temp 97.8°F | Ht 71.0 in | Wt 207.6 lb

## 2021-03-26 DIAGNOSIS — H579 Unspecified disorder of eye and adnexa: Secondary | ICD-10-CM | POA: Diagnosis not present

## 2021-03-26 NOTE — Progress Notes (Signed)
BP (!) 109/58    Pulse 73    Temp 97.8 F (36.6 C) (Oral)    Ht _0  (1.803 m)    Wt 207 lb 9.6 oz (94.2 kg)    SpO2 97%    BMI 28.95 kg/m    Subjective:    Patient ID: Dwayne Jacobson, male    DOB: [birth year], 66 y.o.   MRN: [id number]  HPI: Dwayne Jacobson is a 66 y.o. male  Chief Complaint  Patient presents with   Eye Problem    Patient is here as he got saw dust in his left eye. Patent states he just noticed it last night as he was driving home from work. Patient states he has tried flushing the left eye.    Patient presents to clinic with complaints that he was driving home from work last night and felt like something was in his eye.  He feels like it may be saw dust.  And he tried flushing the eye but the symptoms are still there.  States it feels like there is a small piece of grit in his eye.    Relevant past medical, surgical, family and social history reviewed and updated as indicated. Interim medical history since our last visit reviewed. Allergies and medications reviewed and updated.  Review of Systems  HENT:         Feels like something is in his eye.   Per HPI unless specifically indicated above     Objective:    BP (!) 109/58    Pulse 73    Temp 97.8 F (36.6 C) (Oral)    Ht _1  (1.803 m)    Wt 207 lb 9.6 oz (94.2 kg)    SpO2 97%    BMI 28.95 kg/m   Wt Readings from Last 3 Encounters:  03/26/21 207 lb 9.6 oz (94.2 kg)  03/20/21 202 lb 6.4 oz (91.8 kg)  02/20/21 200 lb (90.7 kg)    Physical Exam Vitals and nursing note reviewed.  Constitutional:      General: He is not in acute distress.    Appearance: Normal appearance. He is not ill-appearing, toxic-appearing or diaphoretic.  HENT:     Head: Normocephalic.     Right Ear: External ear normal.     Left Ear: External ear normal.     Nose: Nose normal. No congestion or rhinorrhea.     Mouth/Throat:     Mouth: Mucous membranes are moist.  Eyes:     General:        Right eye: No discharge.         Left eye: No discharge.     Extraocular Movements: Extraocular movements intact.     Conjunctiva/sclera: Conjunctivae normal.     Pupils: Pupils are equal, round, and reactive to light.   Cardiovascular:     Rate and Rhythm: Normal rate and regular rhythm.     Heart sounds: No murmur heard. Pulmonary:     Effort: Pulmonary effort is normal. No respiratory distress.     Breath sounds: Normal breath sounds. No wheezing, rhonchi or rales.  Abdominal:     General: Abdomen is flat. Bowel sounds are normal.  Musculoskeletal:     Cervical back: Normal range of motion and neck supple.  Skin:    General: Skin is warm and dry.     Capillary Refill: Capillary refill takes less than 2 seconds.  Neurological:     General: No focal deficit present.  Mental Status: He is alert and oriented to person, place, and time.  Psychiatric:        Mood and Affect: Mood normal.        Behavior: Behavior normal.        Thought Content: Thought content normal.        Judgment: Judgment normal.    Results for orders placed or performed in visit on 02/20/21  CBC with Differential/Platelet  Result Value Ref Range   WBC 4.4 3.8 - 10.8 Thousand/uL   RBC 4.46 4.20 - 5.80 Million/uL   Hemoglobin 13.9 13.2 - 17.1 g/dL   HCT 41.0 38.5 - 50.0 %   MCV 91.9 80.0 - 100.0 fL   MCH 31.2 27.0 - 33.0 pg   MCHC 33.9 32.0 - 36.0 g/dL   RDW 12.5 11.0 - 15.0 %   Platelets 296 140 - 400 Thousand/uL   MPV 9.7 7.5 - 12.5 fL   Neutro Abs 2,592 1,500 - 7,800 cells/uL   Lymphs Abs 964 850 - 3,900 cells/uL   Absolute Monocytes 717 200 - 950 cells/uL   Eosinophils Absolute 88 15 - 500 cells/uL   Basophils Absolute 40 0 - 200 cells/uL   Neutrophils Relative % 58.9 %   Total Lymphocyte 21.9 %   Monocytes Relative 16.3 %   Eosinophils Relative 2.0 %   Basophils Relative 0.9 %  COMPLETE METABOLIC PANEL WITH GFR  Result Value Ref Range   Glucose, Bld 100 (H) 65 - 99 mg/dL   BUN 11 7 - 25 mg/dL   Creat 0.86 0.70 - 1.35  mg/dL   eGFR 96 > OR = 60 mL/min/1.100m   BUN/Creatinine Ratio NOT APPLICABLE 6 - 22 (calc)   Sodium 133 (L) 135 - 146 mmol/L   Potassium 4.6 3.5 - 5.3 mmol/L   Chloride 95 (L) 98 - 110 mmol/L   CO2 33 (H) 20 - 32 mmol/L   Calcium 9.7 8.6 - 10.3 mg/dL   Total Protein 7.6 6.1 - 8.1 g/dL   Albumin 4.8 3.6 - 5.1 g/dL   Globulin 2.8 1.9 - 3.7 g/dL (calc)   AG Ratio 1.7 1.0 - 2.5 (calc)   Total Bilirubin 0.4 0.2 - 1.2 mg/dL   Alkaline phosphatase (APISO) 71 35 - 144 U/L   AST 27 10 - 35 U/L   ALT 25 9 - 46 U/L  ANA  Result Value Ref Range   Anti Nuclear Antibody (ANA) NEGATIVE NEGATIVE  Anti-DNA antibody, double-stranded  Result Value Ref Range   ds DNA Ab 6 (H) IU/mL  C3 and C4  Result Value Ref Range   C3 Complement 138 82 - 185 mg/dL   C4 Complement 30 15 - 53 mg/dL  Sedimentation rate  Result Value Ref Range   Sed Rate 6 0 - 20 mm/h  Urinalysis, Routine w reflex microscopic  Result Value Ref Range   Color, Urine YELLOW YELLOW   APPearance CLEAR CLEAR   Specific Gravity, Urine 1.008 1.001 - 1.035   pH 7.0 5.0 - 8.0   Glucose, UA NEGATIVE NEGATIVE   Bilirubin Urine NEGATIVE NEGATIVE   Ketones, ur NEGATIVE NEGATIVE   Hgb urine dipstick NEGATIVE NEGATIVE   Protein, ur NEGATIVE NEGATIVE   Nitrite NEGATIVE NEGATIVE   Leukocytes,Ua NEGATIVE NEGATIVE      Assessment & Plan:   Problem List Items Addressed This Visit   None Visit Diagnoses     Eye problem    -  Primary   Fluoroscein strip used and  observed 3 scratches. Recommend patient follow up with his eye doctor for further evaluation and treatment.        Follow up plan: Return if symptoms worsen or fail to improve.

## 2021-03-26 NOTE — Telephone Encounter (Signed)
°  Chief Complaint: saw dust in eye. Wife not with patient at this time , requesting appt  Symptoms: saw dust in one eye per wife. Flushed per wife and continues pain.  Frequency: this am  Pertinent Negatives: Patient denies na Disposition: [x] ED /[] Urgent Care (no appt availability in office) / [x] Appointment(In office/virtual)/ []  [facility]/ [] Home Care/ [x] Refused Recommended Disposition /[] [facility]/ []  Follow-up with PCP Additional Notes:   Recommended to flush eye again with water and go to ED. Wife reports she can not afford to pay ED. Appt scheduled today. Recommended  if worsening symptoms go to ED.    Reason for Disposition  [1] Eye pain AND [2] persists > 1 hour since irrigation (regardless of duration of flushing)  Answer Assessment - Initial Assessment Questions 1. TYPE OF FOREIGN BODY: "What got in the eye?"      Saw dust  2. ONSET: "When did it happen?"      This am 3. MECHANISM: "How did it happen?"      na 4. VISION: "Do you have blurred vision?"      Patient's wife not with patient but reports he can see 5. PAIN: "Is it painful?" If Yes, ask: "How bad is the pain?"  (Scale 1-10; or mild, moderate, severe)     Na  6. CONTACTS: "Do you wear contacts?"     na 7. OTHER SYMPTOMS: "Do you have any other symptoms?"     na 8. PREGNANCY: "Is there any chance you are pregnant?" "When was your last menstrual period?"     na  Protocols used: Eye - Foreign Body-A-AH

## 2021-03-26 NOTE — Telephone Encounter (Signed)
Pt scheduled with Karen 

## 2021-07-12 ENCOUNTER — Other Ambulatory Visit: Payer: Self-pay | Admitting: Nurse Practitioner

## 2021-07-14 NOTE — Telephone Encounter (Signed)
Requested medication (s) are due for refill today: No  Requested medication (s) are on the active medication list: No  Last refill:    Future visit scheduled: Yes  Notes to clinic:  See pharmacy request.    Requested Prescriptions  Pending Prescriptions Disp Refills   lansoprazole (PREVACID) 30 MG capsule [Pharmacy Med Name: LANSOPRAZOLE DR 30 MG CAPSULE]  0    Sig: Take 1 tablet (40 mg total) by mouth daily.     Gastroenterology: Proton Pump Inhibitors 2 Passed - 07/12/2021  1:00 PM      Passed - ALT in normal range and within 360 days    ALT  Date Value Ref Range Status  02/20/2021 25 9 - 46 U/L Final         Passed - AST in normal range and within 360 days    AST  Date Value Ref Range Status  02/20/2021 27 10 - 35 U/L Final         Passed - Valid encounter within last 12 months    Recent Outpatient Visits           3 months ago Eye problem   [facility] Jon Billings, NP   3 months ago Other systemic lupus erythematosus with other organ involvement ([place])   [facility] Cannady, Barbaraann Faster, NP   6 months ago Visit for suture removal   [place], Brusly, DO   7 months ago Lesion of tonsil   [place], Jolene T, NP   8 months ago Non-recurrent acute serous otitis media of right ear   [place], Barbaraann Faster, NP       Future Appointments             In 1 week Deveshwar, Abel Presto, MD [facility]   In 2 months Cannady, Barbaraann Faster, NP [facility]

## 2021-07-17 NOTE — Progress Notes (Unsigned)
Office Visit Note  Patient: Dwayne Jacobson             Date of Birth: 1956/01/26           MRN: [id number]             PCP: Venita Lick, NP Referring: Venita Lick, NP Visit Date: 07/24/2021 Occupation: '@GUAROCC'$ @  Subjective:  No chief complaint on file.   History of Present Illness: EXANDER SHAUL is a 66 y.o. male ***   Activities of Daily Living:  Patient reports morning stiffness for *** {minute/hour:19697}.   Patient {ACTIONS;DENIES/REPORTS:21021675::"Denies"} nocturnal pain.  Difficulty dressing/grooming: {ACTIONS;DENIES/REPORTS:21021675::"Denies"} Difficulty climbing stairs: {ACTIONS;DENIES/REPORTS:21021675::"Denies"} Difficulty getting out of chair: {ACTIONS;DENIES/REPORTS:21021675::"Denies"} Difficulty using hands for taps, buttons, cutlery, and/or writing: {ACTIONS;DENIES/REPORTS:21021675::"Denies"}  No Rheumatology ROS completed.   PMFS History:  Patient Active Problem List   Diagnosis Date Noted  . Lesion of tonsil 10/31/2020  . Current every day nicotine vaping 10/31/2020  . Healthcare maintenance 09/19/2020  . History of 2019 novel coronavirus disease (COVID-19) 03/15/2020  . Gastroesophageal reflux disease without esophagitis 08/17/2019  . Chondrocalcinosis 01/13/2017  . Primary osteoarthritis of both knees 01/13/2017  . History of DVT (deep vein thrombosis)   . Hypertension   . Hyperlipidemia   . SLE (systemic lupus erythematosus) ([place]) 01/18/2014    Past Medical History:  Diagnosis Date  . DVT (deep venous thrombosis) ([place])   . Hyperlipidemia   . Hypertension   . Seizures ([place])   . Skin cancer   . Systemic lupus erythematosus (HCC)     Family History  Problem Relation Age of Onset  . Diabetes Mother   . Hypertension Mother   . Hyperlipidemia Mother   . Stroke Mother   . Lung disease Mother   . Heart disease Mother   . Heart disease Father   . Lung disease Father   . Diabetes Sister   . Colon polyps Maternal Grandmother     Past Surgical History:  Procedure Laterality Date  . NO PAST SURGERIES     Social History   Social History Narrative  . Not on file   Immunization History  Administered Date(s) Administered  . Fluad Quad(high Dose 65+) 12/05/2020  . Influenza,inj,Quad PF,6+ Mos 01/03/2015, 11/05/2016, 11/07/2018  . Pneumococcal Polysaccharide-23 09/29/2012  . Td 03/25/2006, 08/06/2016     Objective: Vital Signs: There were no vitals taken for this visit.   Physical Exam   Musculoskeletal Exam: ***  CDAI Exam: CDAI Score: -- Patient Global: --; Provider Global: -- Swollen: --; Tender: -- Joint Exam 07/24/2021   No joint exam has been documented for this visit   There is currently no information documented on the homunculus. Go to the Rheumatology activity and complete the homunculus joint exam.  Investigation: No additional findings.  Imaging: No results found.  Recent Labs: Lab Results  Component Value Date   WBC 4.4 02/20/2021   HGB 13.9 02/20/2021   PLT 296 02/20/2021   NA 133 (L) 02/20/2021   K 4.6 02/20/2021   CL 95 (L) 02/20/2021   CO2 33 (H) 02/20/2021   GLUCOSE 100 (H) 02/20/2021   BUN 11 02/20/2021   CREATININE 0.86 02/20/2021   BILITOT 0.4 02/20/2021   ALKPHOS 82 09/19/2020   AST 27 02/20/2021   ALT 25 02/20/2021   PROT 7.6 02/20/2021   ALBUMIN 4.9 (H) 09/19/2020   CALCIUM 9.7 02/20/2021   GFRAA 103 03/21/2020    Speciality Comments: PLQ Eye Exam: 04/06/2020 WNL @ [facility] Follow up in 1 year  Procedures:  No procedures performed Allergies: Patient has no known allergies.   Assessment / Plan:     Visit Diagnoses: 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)  High risk medication use  Primary osteoarthritis of both knees  Chondrocalcinosis  Trapezius muscle spasm  Essential hypertension  Mixed hyperlipidemia  History of DVT (deep vein thrombosis)  Orders: No orders of the defined types were placed in  this encounter.  No orders of the defined types were placed in this encounter.   Face-to-face time spent with patient was *** minutes. Greater than 50% of time was spent in counseling and coordination of care.  Follow-Up Instructions: No follow-ups on file.   Ofilia Neas, PA-C  Note - This record has been created using Dragon software.  Chart creation errors have been sought, but may not always  have been located. Such creation errors do not reflect on  the standard of medical care.

## 2021-07-24 ENCOUNTER — Encounter: Payer: Self-pay | Admitting: Rheumatology

## 2021-07-24 ENCOUNTER — Ambulatory Visit: Payer: No Typology Code available for payment source | Admitting: Rheumatology

## 2021-07-24 VITALS — BP 145/80 | HR 61 | Resp 16 | Ht 71.0 in | Wt 200.0 lb

## 2021-07-24 DIAGNOSIS — Z79899 Other long term (current) drug therapy: Secondary | ICD-10-CM | POA: Diagnosis not present

## 2021-07-24 DIAGNOSIS — I10 Essential (primary) hypertension: Secondary | ICD-10-CM

## 2021-07-24 DIAGNOSIS — M62838 Other muscle spasm: Secondary | ICD-10-CM

## 2021-07-24 DIAGNOSIS — E782 Mixed hyperlipidemia: Secondary | ICD-10-CM

## 2021-07-24 DIAGNOSIS — M170 Bilateral primary osteoarthritis of knee: Secondary | ICD-10-CM

## 2021-07-24 DIAGNOSIS — M3219 Other organ or system involvement in systemic lupus erythematosus: Secondary | ICD-10-CM

## 2021-07-24 DIAGNOSIS — M1120 Other chondrocalcinosis, unspecified site: Secondary | ICD-10-CM

## 2021-07-24 DIAGNOSIS — Z86718 Personal history of other venous thrombosis and embolism: Secondary | ICD-10-CM

## 2021-07-27 LAB — PROTEIN / CREATININE RATIO, URINE
Creatinine, Urine: 37 mg/dL (ref 20–320)
Total Protein, Urine: <4 mg/dL — ABNORMAL LOW (ref 5–25)

## 2021-07-27 LAB — COMPLETE METABOLIC PANEL WITH GFR
AG Ratio: 1.5 (calc) (ref 1.0–2.5)
ALT: 27 U/L (ref 9–46)
AST: 23 U/L (ref 10–35)
Albumin: 4.6 g/dL (ref 3.6–5.1)
Alkaline phosphatase (APISO): 75 U/L (ref 35–144)
BUN: 10 mg/dL (ref 7–25)
CO2: 28 mmol/L (ref 20–32)
Calcium: 9.7 mg/dL (ref 8.6–10.3)
Chloride: 99 mmol/L (ref 98–110)
Creat: 0.88 mg/dL (ref 0.70–1.35)
Globulin: 3.1 g/dL (calc) (ref 1.9–3.7)
Glucose, Bld: 92 mg/dL (ref 65–99)
Potassium: 4.7 mmol/L (ref 3.5–5.3)
Sodium: 137 mmol/L (ref 135–146)
Total Bilirubin: 0.4 mg/dL (ref 0.2–1.2)
Total Protein: 7.7 g/dL (ref 6.1–8.1)
eGFR: 95 mL/min/{1.73_m2} (ref >=60)

## 2021-07-27 LAB — CBC WITH DIFFERENTIAL/PLATELET
Absolute Monocytes: 583 cells/uL (ref 200–950)
Basophils Absolute: 59 cells/uL (ref 0–200)
Basophils Relative: 1.2 %
Eosinophils Absolute: 113 cells/uL (ref 15–500)
Eosinophils Relative: 2.3 %
HCT: 42.1 % (ref 38.5–50.0)
Hemoglobin: 14.5 g/dL (ref 13.2–17.1)
Lymphs Abs: 1083 cells/uL (ref 850–3900)
MCH: 31.4 pg (ref 27.0–33.0)
MCHC: 34.4 g/dL (ref 32.0–36.0)
MCV: 91.1 fL (ref 80.0–100.0)
MPV: 9.7 fL (ref 7.5–12.5)
Monocytes Relative: 11.9 %
Neutro Abs: 3063 cells/uL (ref 1500–7800)
Neutrophils Relative %: 62.5 %
Platelets: 278 10*3/uL (ref 140–400)
RBC: 4.62 10*6/uL (ref 4.20–5.80)
RDW: 13.1 % (ref 11.0–15.0)
Total Lymphocyte: 22.1 %
WBC: 4.9 10*3/uL (ref 3.8–10.8)

## 2021-07-27 LAB — C3 AND C4
C3 Complement: 133 mg/dL (ref 82–185)
C4 Complement: 33 mg/dL (ref 15–53)

## 2021-07-27 LAB — ANTI-DNA ANTIBODY, DOUBLE-STRANDED: ds DNA Ab: 8 IU/mL — ABNORMAL HIGH

## 2021-07-27 LAB — SEDIMENTATION RATE: Sed Rate: 11 mm/h (ref 0–20)

## 2021-09-11 ENCOUNTER — Other Ambulatory Visit: Payer: Self-pay | Admitting: Nurse Practitioner

## 2021-09-11 MED ORDER — HYDROCHLOROTHIAZIDE 25 MG PO TABS: ORAL_TABLET | ORAL | 4 refills | Status: DC

## 2021-09-11 NOTE — Telephone Encounter (Signed)
Medication Refill - Medication: hydrochlorothiazide (HYDRODIURIL) 25 MG tablet Patient says insurance only covers 90 day supply  Has the patient contacted their pharmacy? yes (Agent: If no, request that the patient contact the pharmacy for the refill. If patient does not wish to contact the pharmacy document the reason why and proceed with request.) (Agent: If yes, when and what did the pharmacy advise?)contact pcp  Preferred Pharmacy (with phone number or street name):  [id number] [address]:  [phone] Fax:  [phone]    Has the patient been seen for an appointment in the last year OR does the patient have an upcoming appointment? yes  Agent: Please be advised that RX refills may take up to 3 business days. We ask that you follow-up with your pharmacy.

## 2021-09-18 ENCOUNTER — Encounter: Payer: No Typology Code available for payment source | Admitting: Nurse Practitioner

## 2021-09-20 NOTE — Patient Instructions (Signed)

## 2021-09-25 ENCOUNTER — Encounter: Payer: Self-pay | Admitting: Nurse Practitioner

## 2021-09-25 ENCOUNTER — Other Ambulatory Visit: Payer: Self-pay | Admitting: Nurse Practitioner

## 2021-09-25 ENCOUNTER — Ambulatory Visit (INDEPENDENT_AMBULATORY_CARE_PROVIDER_SITE_OTHER): Payer: No Typology Code available for payment source | Admitting: Nurse Practitioner

## 2021-09-25 VITALS — BP 127/73 | HR 60 | Temp 97.8°F | Ht 71.0 in | Wt 203.1 lb

## 2021-09-25 DIAGNOSIS — M3219 Other organ or system involvement in systemic lupus erythematosus: Secondary | ICD-10-CM

## 2021-09-25 DIAGNOSIS — D6869 Other thrombophilia: Secondary | ICD-10-CM | POA: Diagnosis not present

## 2021-09-25 DIAGNOSIS — I10 Essential (primary) hypertension: Secondary | ICD-10-CM | POA: Diagnosis not present

## 2021-09-25 DIAGNOSIS — Z720 Tobacco use: Secondary | ICD-10-CM

## 2021-09-25 DIAGNOSIS — K219 Gastro-esophageal reflux disease without esophagitis: Secondary | ICD-10-CM

## 2021-09-25 DIAGNOSIS — Z23 Encounter for immunization: Secondary | ICD-10-CM

## 2021-09-25 DIAGNOSIS — Z0000 Encounter for general adult medical examination without abnormal findings: Secondary | ICD-10-CM | POA: Diagnosis not present

## 2021-09-25 DIAGNOSIS — E782 Mixed hyperlipidemia: Secondary | ICD-10-CM

## 2021-09-25 DIAGNOSIS — D1809 Hemangioma of other sites: Secondary | ICD-10-CM

## 2021-09-25 DIAGNOSIS — N400 Enlarged prostate without lower urinary tract symptoms: Secondary | ICD-10-CM

## 2021-09-25 DIAGNOSIS — Z86718 Personal history of other venous thrombosis and embolism: Secondary | ICD-10-CM

## 2021-09-25 MED ORDER — RIVAROXABAN 20 MG PO TABS: 20.0000 mg | ORAL_TABLET | Freq: Every day | ORAL | 4 refills | Status: DC

## 2021-09-25 MED ORDER — PANTOPRAZOLE SODIUM 40 MG PO TBEC
40.0000 mg | DELAYED_RELEASE_TABLET | ORAL | 4 refills | Status: DC
Start: 2021-09-25 — End: 2022-01-19

## 2021-09-25 MED ORDER — ATORVASTATIN CALCIUM 10 MG PO TABS
10.0000 mg | ORAL_TABLET | Freq: Every day | ORAL | 4 refills | Status: DC
Start: 2021-09-25 — End: 2022-03-27

## 2021-09-25 MED ORDER — SHINGRIX 50 MCG/0.5ML IM SUSR: 0.5000 mL | Freq: Once | INTRAMUSCULAR | 0 refills | Status: AC

## 2021-09-25 MED ORDER — LISINOPRIL 20 MG PO TABS: 20.0000 mg | ORAL_TABLET | Freq: Every day | ORAL | 4 refills | Status: DC

## 2021-09-25 MED ORDER — AMLODIPINE BESYLATE 5 MG PO TABS: 5.0000 mg | ORAL_TABLET | Freq: Every day | ORAL | 4 refills | Status: DC

## 2021-09-25 NOTE — Progress Notes (Addendum)
BP 127/73   Pulse 60   Temp 97.8 F (36.6 C) (Oral)   Ht '5\' 11"'  (1.803 m)   Wt 203 lb 1.6 oz (92.1 kg)   SpO2 96%   BMI 28.33 kg/m    Subjective:    Patient ID: Dwayne Jacobson, male    DOB: October 28, 1955, 66 y.o.   MRN: [id number]  HPI: Dwayne Jacobson is a 66 y.o. male presenting on 09/25/2021 for comprehensive medical examination. Current medical complaints include: none  He currently lives with: wife Interim Problems from his last visit: none  HYPERTENSION / HYPERLIPIDEMIA Continues on Lisinopril, HCTZ, Atorvastatin, and Amlodipine.  Has history of a DVT and continues on Xarelto without issue.  Followed by Dr. Estanislado Pandy for SLE, taking Plaquenil daily.  Last visit 07/24/21.   Satisfied with current treatment? yes Duration of hypertension: chronic BP monitoring frequency: not checking BP range: not checking BP medication side effects: no Duration of hyperlipidemia: chronic Cholesterol medication side effects: no Cholesterol supplements: none Medication compliance: good compliance Aspirin: no Recent stressors: no Recurrent headaches: no Visual changes: no Palpitations: no Dyspnea: no Chest pain: no Lower extremity edema: no Dizzy/lightheaded: no   GERD Continues on Protonix 40 MG daily every other day. GERD control status: stable  Satisfied with current treatment? yes Heartburn frequency: none Medication side effects: no  Medication compliance: stable Dysphagia: no Odynophagia:  no Hematemesis: no Blood in stool: no EGD: no  Functional Status Survey: Is the patient deaf or have difficulty hearing?: No Does the patient have difficulty seeing, even when wearing glasses/contacts?: No Does the patient have difficulty concentrating, remembering, or making decisions?: No Does the patient have difficulty walking or climbing stairs?: No Does the patient have difficulty dressing or bathing?: No Does the patient have difficulty doing errands alone such as visiting a  doctor's office or shopping?: No  FALL RISK:    09/25/2021    8:11 AM 03/20/2021    9:48 AM 09/19/2020    8:54 AM 03/21/2020    1:09 PM 08/17/2019    8:20 AM  [place] in the past year? 0 0 0 0 0  Number falls in past yr: 0 0 0  0  Injury with Fall? 0 0 0  0  Risk for fall due to : No Fall Risks No Fall Risks No Fall Risks  No Fall Risks  Follow up Falls evaluation completed Falls evaluation completed Falls evaluation completed  Falls prevention discussed    Depression Screen    09/25/2021    8:11 AM 03/20/2021    9:48 AM 09/19/2020    8:54 AM 03/21/2020    1:09 PM 08/17/2019    8:20 AM  Depression screen PHQ 2/9  Decreased Interest 0 0 0 0 0  Down, Depressed, Hopeless 0 0 0 0 0  PHQ - 2 Score 0 0 0 0 0  Altered sleeping 0 2   0  Tired, decreased energy 0 1   2  Change in appetite 0 0   0  Feeling bad or failure about yourself  0 0   0  Trouble concentrating 0 0   0  Moving slowly or fidgety/restless 0 0   0  Suicidal thoughts 0 0   0  PHQ-9 Score 0 3   2  Difficult doing work/chores Not difficult at all    Not difficult at all    Advanced Directives <no information>  Past Medical History:  Past Medical History:  Diagnosis Date  . DVT (deep venous thrombosis) ([place])   . Hyperlipidemia   . Hypertension   . Seizures ([place])   . Skin cancer   . Systemic lupus erythematosus ([employer])     Surgical History:  Past Surgical History:  Procedure Laterality Date  . NO PAST SURGERIES      Medications:  Current Outpatient Medications on File Prior to Visit  Medication Sig  . clobetasol (TEMOVATE) 0.05 % external solution Apply topically as needed.  . cyclobenzaprine (FLEXERIL) 10 MG tablet Take 10 mg by mouth 3 (three) times daily as needed.   . hydrochlorothiazide (HYDRODIURIL) 25 MG tablet TAKE 1 TABLET BY MOUTH EVERY DAY IN THE MORNING  . hydroxychloroquine (PLAQUENIL) 200 MG tablet Take 200 mg by mouth daily.  . Multiple Vitamin (MULTIVITAMIN) tablet Take 1 tablet  by mouth daily.  Marland Kitchen olopatadine (PATANOL) 0.1 % ophthalmic solution olopatadine 0.1 % eye drops   No current facility-administered medications on file prior to visit.    Allergies:  No Known Allergies  Social History:  Social History   Socioeconomic History  . Marital status: Married    Spouse name: Not on file  . Number of children: Not on file  . Years of education: Not on file  . Highest education level: Not on file  Occupational History  . Not on file  Tobacco Use  . Smoking status: Former    Packs/day: 1.50    Years: 40.00    Total pack years: 60.00    Types: Cigarettes    Quit date: 08/30/2013    Years since quitting: 8.0  . Smokeless tobacco: Former    Types: Secondary school teacher  . Vaping Use: Every day  . Substances: Nicotine  Substance and Sexual Activity  . Alcohol use: Yes    Alcohol/week: 8.0 - 10.0 standard drinks of alcohol    Types: 8 - 10 Cans of beer per week  . Drug use: Never  . Sexual activity: Yes  Other Topics Concern  . Not on file  Social History Narrative  . Not on file   Social Determinants of Health   Financial Resource Strain: Low Risk  (09/25/2021)   Overall Financial Resource Strain (CARDIA)   . Difficulty of Paying Living Expenses: Not hard at all  Food Insecurity: No Food Insecurity (09/25/2021)   Hunger Vital Sign   . Worried About Charity fundraiser in the Last Year: Never true   . Ran Out of Food in the Last Year: Never true  Transportation Needs: No Transportation Needs (09/25/2021)   PRAPARE - Transportation   . Lack of Transportation (Medical): No   . Lack of Transportation (Non-Medical): No  Physical Activity: Insufficiently Active (09/25/2021)   Exercise Vital Sign   . Days of Exercise per Week: 4 days   . Minutes of Exercise per Session: 30 min  Stress: No Stress Concern Present (09/25/2021)   [place]   . Feeling of Stress : Not at all  Social  Connections: Moderately Isolated (09/25/2021)   Social Connection and Isolation Panel [NHANES]   . Frequency of Communication with Friends and Family: Three times a week   . Frequency of Social Gatherings with Friends and Family: Three times a week   . Attends Religious Services: Never   . Active Member of Clubs or Organizations: No   . Attends Archivist Meetings: Never   . Marital Status: Married  Human resources officer Violence:  Not At Risk (09/19/2020)   Humiliation, Afraid, Rape, and Kick questionnaire   . Fear of Current or Ex-Partner: No   . Emotionally Abused: No   . Physically Abused: No   . Sexually Abused: No   Social History   Tobacco Use  Smoking Status Former  . Packs/day: 1.50  . Years: 40.00  . Total pack years: 60.00  . Types: Cigarettes  . Quit date: 08/30/2013  . Years since quitting: 8.0  Smokeless Tobacco Former  . Types: Chew   Social History   Substance and Sexual Activity  Alcohol Use Yes  . Alcohol/week: 8.0 - 10.0 standard drinks of alcohol  . Types: 8 - 10 Cans of beer per week    Family History:  Family History  Problem Relation Age of Onset  . Diabetes Mother   . Hypertension Mother   . Hyperlipidemia Mother   . Stroke Mother   . Lung disease Mother   . Heart disease Mother   . Heart disease Father   . Lung disease Father   . Diabetes Sister   . Colon polyps Maternal Grandmother     Past medical history, surgical history, medications, allergies, family history and social history reviewed with patient today and changes made to appropriate areas of the chart.   Review of Systems - negative All other ROS negative except what is listed above and in the HPI.      Objective:    BP 127/73   Pulse 60   Temp 97.8 F (36.6 C) (Oral)   Ht '5\' 11"'  (1.803 m)   Wt 203 lb 1.6 oz (92.1 kg)   SpO2 96%   BMI 28.33 kg/m   Wt Readings from Last 3 Encounters:  09/25/21 203 lb 1.6 oz (92.1 kg)  07/24/21 200 lb (90.7 kg)  03/26/21 207 lb  9.6 oz (94.2 kg)    Physical Exam Vitals and nursing note reviewed.  Constitutional:      General: He is awake. He is not in acute distress.    Appearance: He is well-developed and well-groomed. He is not ill-appearing.  HENT:     Head: Normocephalic and atraumatic.     Right Ear: Hearing, ear canal and external ear normal. No drainage.     Left Ear: Hearing, ear canal and external ear normal. No drainage.     Nose: Nose normal.     Mouth/Throat:     Pharynx: Uvula midline.  Eyes:     General: Lids are normal.        Right eye: No discharge.        Left eye: No discharge.     Extraocular Movements: Extraocular movements intact.     Conjunctiva/sclera: Conjunctivae normal.     Pupils: Pupils are equal, round, and reactive to light.     Visual Fields: Right eye visual fields normal and left eye visual fields normal.  Neck:     Thyroid: No thyromegaly.     Vascular: No carotid bruit or JVD.     Trachea: Trachea normal.  Cardiovascular:     Rate and Rhythm: Normal rate and regular rhythm.     Heart sounds: Normal heart sounds, S1 normal and S2 normal. No murmur heard.    No gallop.  Pulmonary:     Effort: Pulmonary effort is normal. No accessory muscle usage or respiratory distress.     Breath sounds: Normal breath sounds.  Abdominal:     General: Bowel sounds are normal.  Palpations: Abdomen is soft. There is no hepatomegaly or splenomegaly.     Tenderness: There is no abdominal tenderness.  Musculoskeletal:        General: Normal range of motion.     Cervical back: Normal range of motion and neck supple.     Right lower leg: No edema.     Left lower leg: No edema.  Lymphadenopathy:     Head:     Right side of head: No submental, submandibular, tonsillar, preauricular or posterior auricular adenopathy.     Left side of head: No submental, submandibular, tonsillar, preauricular or posterior auricular adenopathy.     Cervical: No cervical adenopathy.  Skin:    General:  Skin is warm and dry.     Capillary Refill: Capillary refill takes less than 2 seconds.     Findings: No rash.  Neurological:     Mental Status: He is alert and oriented to person, place, and time.     Gait: Gait is intact.     Deep Tendon Reflexes: Reflexes are normal and symmetric.     Reflex Scores:      Brachioradialis reflexes are 2+ on the right side and 2+ on the left side.      Patellar reflexes are 2+ on the right side and 2+ on the left side. Psychiatric:        Attention and Perception: Attention normal.        Mood and Affect: Mood normal.        Speech: Speech normal.        Behavior: Behavior normal. Behavior is cooperative.        Thought Content: Thought content normal.        Cognition and Memory: Cognition normal.        Judgment: Judgment normal.   Results for orders placed or performed in visit on 07/24/21  Protein / creatinine ratio, urine  Result Value Ref Range   Creatinine, Urine 37 20 - 320 mg/dL   Protein/Creat Ratio NOTE 25 - 148 mg/g creat   Protein/Creatinine Ratio NOTE 0.025 - 0.148 mg/mg creat   Total Protein, Urine <4 (L) 5 - 25 mg/dL  CBC with Differential/Platelet  Result Value Ref Range   WBC 4.9 3.8 - 10.8 Thousand/uL   RBC 4.62 4.20 - 5.80 Million/uL   Hemoglobin 14.5 13.2 - 17.1 g/dL   HCT 42.1 38.5 - 50.0 %   MCV 91.1 80.0 - 100.0 fL   MCH 31.4 27.0 - 33.0 pg   MCHC 34.4 32.0 - 36.0 g/dL   RDW 13.1 11.0 - 15.0 %   Platelets 278 140 - 400 Thousand/uL   MPV 9.7 7.5 - 12.5 fL   Neutro Abs 3,063 1,500 - 7,800 cells/uL   Lymphs Abs 1,083 850 - 3,900 cells/uL   Absolute Monocytes 583 200 - 950 cells/uL   Eosinophils Absolute 113 15 - 500 cells/uL   Basophils Absolute 59 0 - 200 cells/uL   Neutrophils Relative % 62.5 %   Total Lymphocyte 22.1 %   Monocytes Relative 11.9 %   Eosinophils Relative 2.3 %   Basophils Relative 1.2 %  COMPLETE METABOLIC PANEL WITH GFR  Result Value Ref Range   Glucose, Bld 92 65 - 99 mg/dL   BUN 10 7 - 25  mg/dL   Creat 0.88 0.70 - 1.35 mg/dL   eGFR 95 > OR = 60 mL/min/1.55m   BUN/Creatinine Ratio NOT APPLICABLE 6 - 22 (calc)   Sodium 137 135 -  146 mmol/L   Potassium 4.7 3.5 - 5.3 mmol/L   Chloride 99 98 - 110 mmol/L   CO2 28 20 - 32 mmol/L   Calcium 9.7 8.6 - 10.3 mg/dL   Total Protein 7.7 6.1 - 8.1 g/dL   Albumin 4.6 3.6 - 5.1 g/dL   Globulin 3.1 1.9 - 3.7 g/dL (calc)   AG Ratio 1.5 1.0 - 2.5 (calc)   Total Bilirubin 0.4 0.2 - 1.2 mg/dL   Alkaline phosphatase (APISO) 75 35 - 144 U/L   AST 23 10 - 35 U/L   ALT 27 9 - 46 U/L  Anti-DNA antibody, double-stranded  Result Value Ref Range   ds DNA Ab 8 (H) IU/mL  C3 and C4  Result Value Ref Range   C3 Complement 133 82 - 185 mg/dL   C4 Complement 33 15 - 53 mg/dL  Sedimentation rate  Result Value Ref Range   Sed Rate 11 0 - 20 mm/h      Assessment & Plan:   Problem List Items Addressed This Visit       Cardiovascular and Mediastinum   Hypertension    Chronic, stable with BP below goal in office today.  Continue current medication regimen and adjust as needed.  Recommend he monitor BP at least weekly at home and document + focus on DASH diet.  LABS: CBC, CMP, TSH.  Refills sent. Return in 6 months.      Relevant Medications   rivaroxaban (XARELTO) 20 MG TABS tablet   atorvastatin (LIPITOR) 10 MG tablet   amLODipine (NORVASC) 5 MG tablet   lisinopril (ZESTRIL) 20 MG tablet   Other Relevant Orders   CBC with Differential/Platelet   Comprehensive metabolic panel   TSH     Digestive   Gastroesophageal reflux disease without esophagitis    Chronic, stable with Protonix.  Continue current medication regimen and consider reduction in future.  Mag level today and annually.  Discussed the risks and benefits of long term PPI use including but not limited to bone loss, chronic kidney disease, infections, low magnesium.  Will aim to use at the lowest dose for the shortest period of time.        Relevant Medications   pantoprazole  (PROTONIX) 40 MG tablet   Other Relevant Orders   Magnesium     Hematopoietic and Hemostatic   Other thrombophilia ([place])    With long term anticoagulant use.  Monitor CBC regularly and watch for increased bleeding or bruising.      Relevant Orders   CBC with Differential/Platelet     Other   Current every day nicotine vaping    I have recommended complete cessation of tobacco use. I have discussed various options available for assistance with tobacco cessation including over the counter methods (Nicotine gum, patch and lozenges). We also discussed prescription options (Chantix, Nicotine Inhaler / Nasal Spray). The patient is not interested in pursuing any prescription tobacco cessation options at this time.  Referral for lung screening ordered.       History of DVT (deep vein thrombosis)    Chronic, stable on Eliquis.  Hematology consult in 2020 and was able to transition to Eliquis, which he has taken without issue and has offered benefit to lifestyle.  CBC  and CMP today.      Relevant Orders   CBC with Differential/Platelet   Hyperlipidemia    Chronic, ongoing.  Continue current medication regimen and adjust as needed.  Lipid panel today.  Relevant Medications   rivaroxaban (XARELTO) 20 MG TABS tablet   atorvastatin (LIPITOR) 10 MG tablet   amLODipine (NORVASC) 5 MG tablet   lisinopril (ZESTRIL) 20 MG tablet   Other Relevant Orders   Comprehensive metabolic panel   Lipid Panel w/o Chol/HDL Ratio   SLE (systemic lupus erythematosus) ([facility]) - Primary    Will continue collaboration with his rheumatologist Dr. Estanislado Pandy.  Continue current medication regimen as prescribed by them.  Recent note and labs reviewed.      Relevant Orders   CBC with Differential/Platelet   Other Visit Diagnoses     Benign prostatic hyperplasia without lower urinary tract symptoms       PSA on labs today   Relevant Orders   PSA   Hypomagnesemia       History of low levels reported, is on  PPI - check today   Pneumococcal vaccination given       PCV20 provided today.   Relevant Orders   Pneumococcal conjugate vaccine 20-valent (Prevnar 20) (Completed)   Need for shingles vaccine       Shingrix vaccines ordered.   Relevant Medications   Zoster Vaccine Adjuvanted [facility]) injection   Zoster Vaccine Adjuvanted [facility]) injection (Start on 11/19/2021)   Encounter for annual physical exam       Annual physical today with labs and health maintenance reviewed, discussed with patient.       Discussed aspirin prophylaxis for myocardial infarction prevention and decision was it was not indicated  LABORATORY TESTING:  Health maintenance labs ordered today as discussed above.   The natural history of prostate cancer and ongoing controversy regarding screening and potential treatment outcomes of prostate cancer has been discussed with the patient. The meaning of a false positive PSA and a false negative PSA has been discussed. He indicates understanding of the limitations of this screening test and wishes to proceed with screening PSA testing.   IMMUNIZATIONS:   - Tdap: Tetanus vaccination status reviewed: last tetanus booster within 10 years -- due next 08/27/2026 - Influenza: Up to date - Pneumovax: in past = 2014 - Prevnar: PCV 20 today - Zostavax vaccine: Refused  SCREENING: - Colonoscopy: Up to date -- Cologuard due next 04/27/2023 Discussed with patient purpose of the colonoscopy is to detect colon cancer at curable precancerous or early stages   - AAA Screening: educated on and wishes to read more about -Hearing Test: Not applicable  -Spirometry: Not applicable   PATIENT COUNSELING:    Sexuality: Discussed sexually transmitted diseases, partner selection, use of condoms, avoidance of unintended pregnancy  and contraceptive alternatives.   Advised to avoid cigarette smoking.  I discussed with the patient that most people either abstain from alcohol or drink  within safe limits (<=14/week and <=4 drinks/occasion for males, <=7/weeks and <= 3 drinks/occasion for females) and that the risk for alcohol disorders and other health effects rises proportionally with the number of drinks per week and how often a drinker exceeds daily limits.  Discussed cessation/primary prevention of drug use and availability of treatment for abuse.   Diet: Encouraged to adjust caloric intake to maintain  or achieve ideal body weight, to reduce intake of dietary saturated fat and total fat, to limit sodium intake by avoiding high sodium foods and not adding table salt, and to maintain adequate dietary potassium and calcium preferably from fresh fruits, vegetables, and low-fat dairy products.    Stressed the importance of regular exercise  Injury prevention: Discussed safety belts, safety helmets,  smoke detector, smoking near bedding or upholstery.   Dental health: Discussed importance of regular tooth brushing, flossing, and dental visits.   Follow up plan: NEXT PREVENTATIVE PHYSICAL DUE IN 1 YEAR. Return in about 6 months (around 03/28/2022) for HTN/HLD, SLE.

## 2021-09-25 NOTE — Assessment & Plan Note (Signed)
Chronic, ongoing.  Continue current medication regimen and adjust as needed. Lipid panel today. 

## 2021-09-25 NOTE — Assessment & Plan Note (Signed)
Chronic, stable with BP below goal in office today.  Continue current medication regimen and adjust as needed.  Recommend he monitor BP at least weekly at home and document + focus on DASH diet.  LABS: CBC, CMP, TSH.  Refills sent. Return in 6 months.

## 2021-09-25 NOTE — Telephone Encounter (Signed)
Requested medication (s) are due for refill today: no  Requested medication (s) are on the active medication list: yes  Last refill:  today  Future visit scheduled: yes  Notes to clinic:  Alternative Requested:INSURANCE ONLY COVERS 90 EVERY 365 DAYS WITHOUT AN OVERRIDE. THANKS.     Requested Prescriptions  Pending Prescriptions Disp Refills   lansoprazole (PREVACID) 30 MG capsule [Pharmacy Med Name: LANSOPRAZOLE DR 30 MG CAPSULE]  0    Sig: Take 1 tablet (40 mg total) by mouth every other day.     Gastroenterology: Proton Pump Inhibitors 2 Passed - 09/25/2021  9:43 AM      Passed - ALT in normal range and within 360 days    ALT  Date Value Ref Range Status  07/24/2021 27 9 - 46 U/L Final         Passed - AST in normal range and within 360 days    AST  Date Value Ref Range Status  07/24/2021 23 10 - 35 U/L Final         Passed - Valid encounter within last 12 months    Recent Outpatient Visits           Today Other systemic lupus erythematosus with other organ involvement [facility])   [place], Barbaraann Faster, NP   6 months ago Eye problem   [facility] Jon Billings, NP   6 months ago Other systemic lupus erythematosus with other organ involvement ([place])   [place] Cannady, Barbaraann Faster, NP   8 months ago Visit for suture removal   [place], [place], DO   9 months ago [facility] [place] Venita Lick, NP       Future Appointments             In 2 months [place] Blinda [place] [facility] [place] [place]   In 6 months Cannady, Barbaraann Faster, NP [employer]

## 2021-09-25 NOTE — Assessment & Plan Note (Signed)
Will continue collaboration with his rheumatologist Dr. Deveshwar.  Continue current medication regimen as prescribed by them.  Recent note and labs reviewed. 

## 2021-09-25 NOTE — Assessment & Plan Note (Signed)
With long term anticoagulant use.  Monitor CBC regularly and watch for increased bleeding or bruising. 

## 2021-09-25 NOTE — Assessment & Plan Note (Addendum)
Chronic, stable with Protonix.  Continue current medication regimen and consider reduction in future.  Mag level today and annually.  Discussed the risks and benefits of long term PPI use including but not limited to bone loss, chronic kidney disease, infections, low magnesium.  Will aim to use at the lowest dose for the shortest period of time.

## 2021-09-25 NOTE — Telephone Encounter (Signed)
Patient was made aware of Jolene's recommendations. Patient was advised to give our office a call, if he has any questions or concerns. Patient verbalized understanding.

## 2021-09-25 NOTE — Assessment & Plan Note (Signed)
Chronic, stable on Eliquis.  Hematology consult in 2020 and was able to transition to Eliquis, which he has taken without issue and has offered benefit to lifestyle.  CBC  and CMP today.

## 2021-09-25 NOTE — Assessment & Plan Note (Signed)
I have recommended complete cessation of tobacco use. I have discussed various options available for assistance with tobacco cessation including over the counter methods (Nicotine gum, patch and lozenges). We also discussed prescription options (Chantix, Nicotine Inhaler / Nasal Spray). The patient is not interested in pursuing any prescription tobacco cessation options at this time.  Referral for lung screening ordered.  

## 2021-09-26 LAB — LIPID PANEL W/O CHOL/HDL RATIO
Cholesterol, Total: 171 mg/dL (ref 100–199)
HDL: 57 mg/dL (ref >39)
LDL Chol Calc (NIH): 96 mg/dL (ref 0–99)
Triglycerides: 99 mg/dL (ref 0–149)
VLDL Cholesterol Cal: 18 mg/dL (ref 5–40)

## 2021-09-26 LAB — COMPREHENSIVE METABOLIC PANEL
ALT: 31 IU/L (ref 0–44)
AST: 30 IU/L (ref 0–40)
Albumin/Globulin Ratio: 1.9 (ref 1.2–2.2)
Albumin: 4.7 g/dL (ref 3.9–4.9)
Alkaline Phosphatase: 87 IU/L (ref 44–121)
BUN/Creatinine Ratio: 11 (ref 10–24)
BUN: 10 mg/dL (ref 8–27)
Bilirubin Total: 0.3 mg/dL (ref 0.0–1.2)
CO2: 19 mmol/L — ABNORMAL LOW (ref 20–29)
Calcium: 7.1 mg/dL — ABNORMAL LOW (ref 8.6–10.2)
Chloride: 97 mmol/L (ref 96–106)
Creatinine, Ser: 0.91 mg/dL (ref 0.76–1.27)
Globulin, Total: 2.5 g/dL (ref 1.5–4.5)
Glucose: 103 mg/dL — ABNORMAL HIGH (ref 70–99)
Potassium: 4.6 mmol/L (ref 3.5–5.2)
Sodium: 137 mmol/L (ref 134–144)
Total Protein: 7.2 g/dL (ref 6.0–8.5)
eGFR: 93 mL/min/{1.73_m2} (ref >59)

## 2021-09-26 LAB — CBC WITH DIFFERENTIAL/PLATELET
Basophils Absolute: 0 10*3/uL (ref 0.0–0.2)
Basos: 1 %
EOS (ABSOLUTE): 0.1 10*3/uL (ref 0.0–0.4)
Eos: 3 %
Hematocrit: 42.6 % (ref 37.5–51.0)
Hemoglobin: 14 g/dL (ref 13.0–17.7)
Immature Grans (Abs): 0 10*3/uL (ref 0.0–0.1)
Immature Granulocytes: 0 %
Lymphocytes Absolute: 1.1 10*3/uL (ref 0.7–3.1)
Lymphs: 21 %
MCH: 30.7 pg (ref 26.6–33.0)
MCHC: 32.9 g/dL (ref 31.5–35.7)
MCV: 93 fL (ref 79–97)
Monocytes Absolute: 0.6 10*3/uL (ref 0.1–0.9)
Monocytes: 13 %
Neutrophils Absolute: 3.1 10*3/uL (ref 1.4–7.0)
Neutrophils: 62 %
Platelets: 269 10*3/uL (ref 150–450)
RBC: 4.56 x10E6/uL (ref 4.14–5.80)
RDW: 13.2 % (ref 11.6–15.4)
WBC: 5 10*3/uL (ref 3.4–10.8)

## 2021-09-26 LAB — TSH: TSH: 2.36 u[IU]/mL (ref 0.450–4.500)

## 2021-09-26 LAB — PSA: Prostate Specific Ag, Serum: 1.1 ng/mL (ref 0.0–4.0)

## 2021-09-26 NOTE — Addendum Note (Signed)
Addended by: Marnee Guarneri T on: 09/26/2021 09:05 AM   Modules accepted: Orders

## 2021-09-26 NOTE — Progress Notes (Signed)
Contacted via [place] morning Terin, your labs have returned: - Kidney function, creatinine and eGFR, remains normal, as is liver function, AST and ALT.  - Calcium is on low side, I recommend you start a low dose calcium supplement daily to help normalize this and help with bone health. - Remainder of labs are all stable -- continue all medications:) Great job!! Keep being stellar!!  Thank you for allowing me to participate in your care.  I appreciate you. Kindest regards, Houda Brau

## 2021-11-11 ENCOUNTER — Other Ambulatory Visit: Payer: Self-pay | Admitting: Nurse Practitioner

## 2021-11-11 NOTE — Telephone Encounter (Signed)
Medication Refill - Medication: hydrochlorothiazide (HYDRODIURIL) 25 MG tablet  Has the patient contacted their pharmacy? yes (Agent: If no, request that the patient contact the pharmacy for the refill. If patient does not wish to contact the pharmacy document the reason why and proceed with request.)  (Agent: If yes, when and what did the pharmacy advise?)patient was told too early to fill and he is out of this med.  Preferred Pharmacy (with phone number or street name):  [id number] [place], [place] [address]:  [phone] Fax:  [phone]    Has the patient been seen for an appointment in the last year OR does the patient have an upcoming appointment? yes  Agent: Please be advised that RX refills may take up to 3 business days. We ask that you follow-up with your pharmacy.

## 2021-11-12 NOTE — Telephone Encounter (Signed)
Unable to refill per protocol,request is too soon. Last refill for 09/11/21 for 90 and 4 RF. E-Prescribing Status: Receipt confirmed by pharmacy (09/11/2021 2:29 PM EDT). Will refuse.  Requested Prescriptions  Pending Prescriptions Disp Refills  . hydrochlorothiazide (HYDRODIURIL) 25 MG tablet 90 tablet 4    Sig: TAKE 1 TABLET BY MOUTH EVERY DAY IN THE MORNING     Cardiovascular: Diuretics - Thiazide Passed - 11/11/2021  5:00 PM      Passed - Cr in normal range and within 180 days    Creat  Date Value Ref Range Status  07/24/2021 0.88 0.70 - 1.35 mg/dL Final   Creatinine, Ser  Date Value Ref Range Status  09/25/2021 0.91 0.76 - 1.27 mg/dL Final   Creatinine, Urine  Date Value Ref Range Status  07/24/2021 37 20 - 320 mg/dL Final         Passed - K in normal range and within 180 days    Potassium  Date Value Ref Range Status  09/25/2021 4.6 3.5 - 5.2 mmol/L Final         Passed - Na in normal range and within 180 days    Sodium  Date Value Ref Range Status  09/25/2021 137 134 - 144 mmol/L Final         Passed - Last BP in normal range    BP Readings from Last 1 Encounters:  09/25/21 127/73         Passed - Valid encounter within last 6 months    Recent Outpatient Visits          1 month ago 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 [place]   In 4 months Cannady, Barbaraann Faster, NP [employer]

## 2021-11-20 NOTE — Progress Notes (Signed)
Office Visit Note  Patient: Dwayne Jacobson             Date of Birth: [birth year]           MRN: [id number]             PCP: Venita Lick, NP Referring: Venita Lick, NP Visit Date: 12/04/2021 Occupation: @GUAROCC @  Subjective:  Medication monitoring   History of Present Illness: Dwayne Jacobson is a 66 y.o. male with history of systemic lupus erythematosus and osteoarthritis.  Patient is currently taking Plaquenil 200 mg 1 tablet by mouth daily.  He is tolerating Plaquenil without any side effects and has not missed any doses recently.  He denies any signs or symptoms of a systemic lupus flare.  He has intermittent symptoms of Raynaud's phenomenon but denies any digital ulcerations or signs of gangrene.  He has not had any recent rashes.  He continues to see his dermatologist on a yearly basis.  He has chronic dry eyes and uses Systane eyedrops as needed.  He denies any mouth dryness or sores in his mouth or nose.  He denies any swollen lymph nodes.  He denies any joint pain, joint swelling, or joint stiffness.  He denies any new medical conditions.  He has not had any recent or recurrent infections.  He received the annual flu shot.    Activities of Daily Living:  Patient reports morning stiffness for 0 minutes.   Patient Denies nocturnal pain.  Difficulty dressing/grooming: Denies Difficulty climbing stairs: Denies Difficulty getting out of chair: Denies Difficulty using hands for taps, buttons, cutlery, and/or writing: Denies  Review of Systems  Constitutional:  Positive for fatigue.  HENT:  Negative for mouth sores and mouth dryness.   Eyes:  Positive for dryness.  Respiratory:  Negative for shortness of breath.   Cardiovascular:  Negative for chest pain and palpitations.  Gastrointestinal:  Negative for blood in stool, constipation and diarrhea.  Endocrine: Negative for increased urination.  Genitourinary:  Negative for involuntary urination.  Musculoskeletal:   Negative for joint pain, gait problem, joint pain, joint swelling, myalgias, muscle weakness, morning stiffness, muscle tenderness and myalgias.  Skin:  Negative for color change, rash and sensitivity to sunlight.  Allergic/Immunologic: Negative for susceptible to infections.  Neurological:  Negative for dizziness and headaches.  Hematological:  Negative for swollen glands.  Psychiatric/Behavioral:  Negative for depressed mood and sleep disturbance. The patient is not nervous/anxious.     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   Current every day nicotine vaping 10/31/2020   Healthcare maintenance 09/19/2020   Gastroesophageal reflux disease without esophagitis 08/17/2019   Chondrocalcinosis 01/13/2017   Primary osteoarthritis of both knees 01/13/2017   History of DVT (deep vein thrombosis)    Hypertension    Hyperlipidemia    SLE (systemic lupus erythematosus) ([place]) 01/18/2014    Past Medical History:  Diagnosis Date   DVT (deep venous thrombosis) (HCC)    Hyperlipidemia    Hypertension    Seizures (HCC)    Skin cancer    Systemic lupus erythematosus ([place])     Family History  Problem Relation Age of Onset   Diabetes Mother    Hypertension Mother    Hyperlipidemia Mother    Stroke Mother    Lung disease Mother    Heart disease Mother    Heart disease Father    Lung disease Father  Diabetes Sister    Colon polyps Maternal Grandmother    Past Surgical History:  Procedure Laterality Date   NO PAST SURGERIES     Social History   Social History Narrative   Not on file   Immunization History  Administered Date(s) Administered   Fluad Quad(high Dose 65+) 12/05/2020   Influenza,inj,Quad PF,6+ Mos 01/03/2015, 11/05/2016, 11/07/2018   PNEUMOCOCCAL CONJUGATE-20 09/25/2021   Pneumococcal Polysaccharide-23 09/29/2012   Td 03/25/2006, 08/06/2016     Objective: Vital Signs: BP (!) 157/89 (BP Location:  Left Arm, Patient Position: Sitting, Cuff Size: Normal)   Pulse (!) 54   Resp 18   Ht $R'5\' 11"'mC$  (1.803 m)   Wt 201 lb 3.2 oz (91.3 kg)   BMI 28.06 kg/m    Physical Exam Vitals and nursing note reviewed.  Constitutional:      Appearance: He is well-developed.  HENT:     Head: Normocephalic and atraumatic.  Eyes:     Conjunctiva/sclera: Conjunctivae normal.     Pupils: Pupils are equal, round, and reactive to light.  Cardiovascular:     Rate and Rhythm: Normal rate and regular rhythm.     Heart sounds: Normal heart sounds.  Pulmonary:     Effort: Pulmonary effort is normal.     Breath sounds: Normal breath sounds.  Abdominal:     General: Bowel sounds are normal.     Palpations: Abdomen is soft.  Musculoskeletal:     Cervical back: Normal range of motion and neck supple.  Skin:    General: Skin is warm and dry.     Capillary Refill: Capillary refill takes 2 to 3 seconds.     Comments: Diffuse facial erythema   Neurological:     Mental Status: He is alert and oriented to person, place, and time.  Psychiatric:        Behavior: Behavior normal.      Musculoskeletal Exam: C-spine, thoracic spine, lumbar spine have good range of motion.  No midline spinal tenderness or SI joint tenderness.  Shoulder joints, elbow joints, wrist joints, MCPs, PIPs, DIPs have good range of motion with no synovitis.  Complete fist formation bilaterally.  PIP and DIP thickening consistent with osteoarthritis of both hands.  Hip joints have good range of motion with no groin pain.  Knee joints have good range of motion with no warmth or effusion.  Ankle joints have good range of motion with no tenderness or joint swelling.  CDAI Exam: CDAI Score: -- Patient Global: --; Provider Global: -- Swollen: --; Tender: -- Joint Exam 12/04/2021   No joint exam has been documented for this visit   There is currently no information documented on the homunculus. Go to the Rheumatology activity and complete the  homunculus joint exam.  Investigation: No additional findings.  Imaging: No results found.  Recent Labs: Lab Results  Component Value Date   WBC 5.0 09/25/2021   HGB 14.0 09/25/2021   PLT 269 09/25/2021   NA 137 09/25/2021   K 4.6 09/25/2021   CL 97 09/25/2021   CO2 19 (L) 09/25/2021   GLUCOSE 103 (H) 09/25/2021   BUN 10 09/25/2021   CREATININE 0.91 09/25/2021   BILITOT 0.3 09/25/2021   ALKPHOS 87 09/25/2021   AST 30 09/25/2021   ALT 31 09/25/2021   PROT 7.2 09/25/2021   ALBUMIN 4.7 09/25/2021   CALCIUM 7.1 (L) 09/25/2021   GFRAA 103 03/21/2020    Speciality Comments: PLQ Eye Exam: 04/06/2020 WNL @ [facility]  Follow up in 1 year  Procedures:  No procedures performed Allergies: Patient has no known allergies.      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 History of rash, diagnosed with lupus by Dr. Jefm Bryant many years ago: He has not had any signs or symptoms of a systemic lupus flare.  He has clinically been doing well taking Plaquenil 200 mg 1 tablet by mouth daily prescribed by Dr. Kirkland Hun.  He continues to see his dermatologist on a yearly basis.  He has not had any new rashes.  He has diffuse facial erythema but no Malar rash was noted.  He continues to have intermittent symptoms of Raynaud's phenomenon with delayed capillary refill 2 to 3 seconds.  He had no digital ulcerations or signs of gangrene.  He has not had any oral or nasal ulcerations.  He has chronic dry eyes and uses Systane eyedrops.  He has not had any increased joint pain, joint swelling, or joint stiffness.  He had no synovitis on examination today. Lab work from 07/24/2021 was reviewed today in the office: Double-stranded DNA 8, ESR within normal limits, and complements within normal limits.  The following lab work will be obtained today for further evaluation.  He will remain on Plaquenil as  prescribed.  He was advised to notify us if he develops signs or symptoms of a flare.  He will follow-up in the office in 5 months or sooner if needed. - Plan: CBC with Differential/Platelet, COMPLETE METABOLIC PANEL WITH GFR, ANA, Urinalysis, Routine w reflex microscopic, C3 and C4, Anti-DNA antibody, double-stranded, Sedimentation rate  High risk medication use - Plaquenil 200 mg 1 tablet by mouth daily-prescribed by Dr. Kirkland Hun (dermatologist).  - Plan: CBC with Differential/Platelet, COMPLETE METABOLIC PANEL WITH GFR PLQ Eye Exam: 04/06/2020 WNL @ [facility] Follow up in 1 year.  He is scheduled for an updated Plaquenil eye examination in January 2024.  He was given a Plaquenil eye examination form to take with him to his upcoming appointment. CBC and CMP 09/25/21.  Orders for CBC and CMP were released today.  Primary osteoarthritis of both knees - X-rays in 2018 showed moderate osteoarthritis.  He has good range of motion of both knee joints on examination.  No warmth or effusion noted.  Chondrocalcinosis: He has not had any signs or symptoms of a pseudogout flare.  Other medical conditions are listed as follows:  Essential hypertension: Blood pressure was 157/89 today in the office.  His blood pressure was rechecked.  He was advised to monitor his blood pressure closely.  Mixed hyperlipidemia  History of DVT (deep vein thrombosis) - in 2001.  He is on long-term Xarelto.  Anticardiolipin, beta-2 GP 1 and lupus anticoagulant were negative when tested in July 2022.  Orders: Orders Placed This Encounter  Procedures   CBC with Differential/Platelet   COMPLETE METABOLIC PANEL WITH GFR   ANA   Urinalysis, Routine w reflex microscopic   C3 and C4   Anti-DNA antibody, double-stranded   Sedimentation rate   No orders of the defined types were placed in this encounter.    Follow-Up Instructions: Return in about 5 months (around 05/05/2022) for Systemic lupus erythematosus,  Osteoarthritis.   Ofilia Neas, PA-C  Note - This record has been created using Dragon software.  Chart creation errors have been sought, but may not always  have been located. Such creation  errors do not reflect on  the standard of medical care.

## 2021-12-04 ENCOUNTER — Ambulatory Visit: Payer: No Typology Code available for payment source | Attending: Physician Assistant | Admitting: Physician Assistant

## 2021-12-04 ENCOUNTER — Encounter: Payer: Self-pay | Admitting: Physician Assistant

## 2021-12-04 VITALS — BP 155/81 | HR 57 | Resp 18 | Ht 71.0 in | Wt 201.2 lb

## 2021-12-04 DIAGNOSIS — Z79899 Other long term (current) drug therapy: Secondary | ICD-10-CM

## 2021-12-04 DIAGNOSIS — Z86718 Personal history of other venous thrombosis and embolism: Secondary | ICD-10-CM

## 2021-12-04 DIAGNOSIS — I10 Essential (primary) hypertension: Secondary | ICD-10-CM

## 2021-12-04 DIAGNOSIS — M3219 Other organ or system involvement in systemic lupus erythematosus: Secondary | ICD-10-CM | POA: Diagnosis not present

## 2021-12-04 DIAGNOSIS — M1120 Other chondrocalcinosis, unspecified site: Secondary | ICD-10-CM

## 2021-12-04 DIAGNOSIS — M170 Bilateral primary osteoarthritis of knee: Secondary | ICD-10-CM | POA: Diagnosis not present

## 2021-12-04 DIAGNOSIS — E782 Mixed hyperlipidemia: Secondary | ICD-10-CM

## 2021-12-05 LAB — CBC WITH DIFFERENTIAL/PLATELET
Absolute Monocytes: 711 cells/uL (ref 200–950)
Basophils Absolute: 49 cells/uL (ref 0–200)
Basophils Relative: 1 %
Eosinophils Absolute: 221 cells/uL (ref 15–500)
Eosinophils Relative: 4.5 %
HCT: 42.7 % (ref 38.5–50.0)
Hemoglobin: 14.2 g/dL (ref 13.2–17.1)
Lymphs Abs: 1039 cells/uL (ref 850–3900)
MCH: 31.6 pg (ref 27.0–33.0)
MCHC: 33.3 g/dL (ref 32.0–36.0)
MCV: 94.9 fL (ref 80.0–100.0)
MPV: 9.7 fL (ref 7.5–12.5)
Monocytes Relative: 14.5 %
Neutro Abs: 2881 cells/uL (ref 1500–7800)
Neutrophils Relative %: 58.8 %
Platelets: 280 10*3/uL (ref 140–400)
RBC: 4.5 10*6/uL (ref 4.20–5.80)
RDW: 12.5 % (ref 11.0–15.0)
Total Lymphocyte: 21.2 %
WBC: 4.9 10*3/uL (ref 3.8–10.8)

## 2021-12-05 LAB — C3 AND C4
C3 Complement: 139 mg/dL (ref 82–185)
C4 Complement: 30 mg/dL (ref 15–53)

## 2021-12-05 LAB — COMPLETE METABOLIC PANEL WITH GFR
AG Ratio: 1.6 (calc) (ref 1.0–2.5)
ALT: 32 U/L (ref 9–46)
AST: 27 U/L (ref 10–35)
Albumin: 4.6 g/dL (ref 3.6–5.1)
Alkaline phosphatase (APISO): 72 U/L (ref 35–144)
BUN: 11 mg/dL (ref 7–25)
CO2: 28 mmol/L (ref 20–32)
Calcium: 10 mg/dL (ref 8.6–10.3)
Chloride: 100 mmol/L (ref 98–110)
Creat: 0.83 mg/dL (ref 0.70–1.35)
Globulin: 2.9 g/dL (calc) (ref 1.9–3.7)
Glucose, Bld: 98 mg/dL (ref 65–99)
Potassium: 4.4 mmol/L (ref 3.5–5.3)
Sodium: 137 mmol/L (ref 135–146)
Total Bilirubin: 0.4 mg/dL (ref 0.2–1.2)
Total Protein: 7.5 g/dL (ref 6.1–8.1)
eGFR: 97 mL/min/{1.73_m2} (ref >=60)

## 2021-12-05 LAB — URINALYSIS, ROUTINE W REFLEX MICROSCOPIC
Bilirubin Urine: NEGATIVE
Glucose, UA: NEGATIVE
Hgb urine dipstick: NEGATIVE
Ketones, ur: NEGATIVE
Leukocytes,Ua: NEGATIVE
Nitrite: NEGATIVE
Protein, ur: NEGATIVE
Specific Gravity, Urine: 1.012 (ref 1.001–1.035)
pH: 6 (ref 5.0–8.0)

## 2021-12-05 LAB — SEDIMENTATION RATE: Sed Rate: 14 mm/h (ref 0–20)

## 2021-12-05 LAB — ANTI-DNA ANTIBODY, DOUBLE-STRANDED: ds DNA Ab: 7 IU/mL — ABNORMAL HIGH

## 2021-12-05 LAB — ANA: Anti Nuclear Antibody (ANA): NEGATIVE

## 2021-12-07 NOTE — Progress Notes (Signed)
dsDNA is within the indeterminate range-7. Complements WNL.  ESR Wnl. UA normal.  CBC and CMP WNL.  ANA negative.  Labs are not consistent with a flare.

## 2021-12-18 ENCOUNTER — Ambulatory Visit: Payer: No Typology Code available for payment source | Admitting: Physician Assistant

## 2022-01-18 ENCOUNTER — Other Ambulatory Visit: Payer: Self-pay | Admitting: Nurse Practitioner

## 2022-01-19 NOTE — Telephone Encounter (Signed)
Requested medication (s) are due for refill today - no  Requested medication (s) are on the active medication list -yes  Future visit scheduled -yes  Last refill: 09/25/21 #45 4RF  Notes to clinic: Pharmacy requesting 90 day/tab supply- request requires changes to original Rx- needs plan limitation override   Requested Prescriptions  Pending Prescriptions Disp Refills   pantoprazole (PROTONIX) 40 MG tablet [Pharmacy Med Name: PANTOPRAZOLE SOD DR 40 MG TAB] 45 tablet 4    Sig: TAKE 1 TABLET BY MOUTH EVERY OTHER DAY     Gastroenterology: Proton Pump Inhibitors Passed - 01/18/2022 10:40 AM      Passed - Valid encounter within last 12 months    Recent Outpatient Visits           3 months ago 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. [place]

## 2022-03-21 NOTE — Patient Instructions (Signed)

## 2022-03-26 ENCOUNTER — Ambulatory Visit: Payer: No Typology Code available for payment source | Admitting: Nurse Practitioner

## 2022-03-26 ENCOUNTER — Encounter: Payer: Self-pay | Admitting: Nurse Practitioner

## 2022-03-26 VITALS — BP 96/63 | HR 65 | Temp 98.0°F | Ht 70.98 in | Wt 205.8 lb

## 2022-03-26 DIAGNOSIS — M3219 Other organ or system involvement in systemic lupus erythematosus: Secondary | ICD-10-CM | POA: Diagnosis not present

## 2022-03-26 DIAGNOSIS — Z86718 Personal history of other venous thrombosis and embolism: Secondary | ICD-10-CM

## 2022-03-26 DIAGNOSIS — N529 Male erectile dysfunction, unspecified: Secondary | ICD-10-CM | POA: Insufficient documentation

## 2022-03-26 DIAGNOSIS — E782 Mixed hyperlipidemia: Secondary | ICD-10-CM

## 2022-03-26 DIAGNOSIS — Z720 Tobacco use: Secondary | ICD-10-CM

## 2022-03-26 DIAGNOSIS — I10 Essential (primary) hypertension: Secondary | ICD-10-CM | POA: Diagnosis not present

## 2022-03-26 DIAGNOSIS — N522 Drug-induced erectile dysfunction: Secondary | ICD-10-CM

## 2022-03-26 DIAGNOSIS — D6869 Other thrombophilia: Secondary | ICD-10-CM

## 2022-03-26 DIAGNOSIS — K219 Gastro-esophageal reflux disease without esophagitis: Secondary | ICD-10-CM

## 2022-03-26 MED ORDER — SILDENAFIL CITRATE 100 MG PO TABS: 50.0000 mg | ORAL_TABLET | Freq: Every day | ORAL | 11 refills | Status: AC | PRN

## 2022-03-26 NOTE — Assessment & Plan Note (Signed)
I have recommended complete cessation of tobacco use. I have discussed various options available for assistance with tobacco cessation including over the counter methods (Nicotine gum, patch and lozenges). We also discussed prescription options (Chantix, Nicotine Inhaler / Nasal Spray). The patient is not interested in pursuing any prescription tobacco cessation options at this time.  Referral for lung screening ordered.

## 2022-03-26 NOTE — Assessment & Plan Note (Signed)
Chronic, stable on Eliquis.  Hematology consult in 2020 and was able to transition to Eliquis, which he has taken without issue and has offered benefit to lifestyle.  CMP today.

## 2022-03-26 NOTE — Assessment & Plan Note (Signed)
Due to BP medications -- will trial Viagra, he has taken in past.  Sent to pharmacy and educated him on this.

## 2022-03-26 NOTE — Assessment & Plan Note (Signed)
Chronic, stable.  BP well below goal today and on lower side, have recommend he monitor BP at least 3 times a week and document for next visit as we may be able to reduce BP medications.  Discussed at length with him.  Continue current medication regimen and adjust as needed.  Focus on DASH diet.  LABS: CMP.  Refills up to date. Return in 6 months.

## 2022-03-26 NOTE — Assessment & Plan Note (Signed)
With long term anticoagulant use.  Monitor CBC regularly and watch for increased bleeding or bruising.

## 2022-03-26 NOTE — Assessment & Plan Note (Signed)
Chronic, stable with Omeprazole.  Continue current medication regimen and consider reduction in future.  Mag level annually.  Discussed the risks and benefits of long term PPI use including but not limited to bone loss, chronic kidney disease, infections, low magnesium.  Will aim to use at the lowest dose for the shortest period of time.  He has tried reduction without success.

## 2022-03-26 NOTE — Assessment & Plan Note (Signed)
Will continue collaboration with his rheumatologist Dr. Estanislado Pandy.  Continue current medication regimen as prescribed by them.  Recent note and labs reviewed.

## 2022-03-26 NOTE — Progress Notes (Signed)
BP 96/63   Pulse 65   Temp 98 F (36.7 C) (Oral)   Ht 5' 10.98" (1.803 m)   Wt 205 lb 12.8 oz (93.4 kg)   SpO2 98%   BMI 28.72 kg/m    Subjective:    Patient ID: Dwayne Jacobson, male    DOB: 07-09-1955, 67 y.o.   MRN: [id number]  HPI: Dwayne Jacobson is a 67 y.o. male  Chief Complaint  Patient presents with   Hypertension   Hyperlipidemia   HYPERTENSION / HYPERLIPIDEMIA Continues on Lisinopril, HCTZ, Atorvastatin, and Amlodipine. He is requesting prescription for Viagra - having difficulty with erectile dysfunction.  Followed by Dr. Estanislado Pandy for SLE, taking Plaquenil daily with last visit with them on 12/04/21.  Has history of a DVT and continues on Xarelto without issue.  Currently vapes every day nicotine -- quit cigarettes 9 years ago.   Satisfied with current treatment? yes Duration of hypertension: chronic BP monitoring frequency: not checking BP range: not checking BP medication side effects: no Duration of hyperlipidemia: chronic Cholesterol medication side effects: no Cholesterol supplements: none Medication compliance: good compliance Aspirin: no Recent stressors: no Recurrent headaches: no Visual changes: no Palpitations: no Dyspnea: no Chest pain: no Lower extremity edema: no Dizzy/lightheaded: no    GERD Taking Omeprazole for heart burn, has reduced dosing to 20 MG.  Tried without medication but heart burn returned. GERD control status: stable  Satisfied with current treatment? yes Heartburn frequency: none recently Medication side effects: no  Medication compliance: stable Dysphagia: no Odynophagia:  no Hematemesis: no Blood in stool: no EGD: no  Relevant past medical, surgical, family and social history reviewed and updated as indicated. Interim medical history since our last visit reviewed. Allergies and medications reviewed and updated.  Review of Systems  Constitutional:  Negative for activity change, diaphoresis, fatigue and fever.   Respiratory:  Negative for cough, chest tightness, shortness of breath and wheezing.   Cardiovascular:  Negative for chest pain, palpitations and leg swelling.  Gastrointestinal: Negative.   Neurological: Negative.   Psychiatric/Behavioral: Negative.      Per HPI unless specifically indicated above     Objective:    BP 96/63   Pulse 65   Temp 98 F (36.7 C) (Oral)   Ht 5' 10.98" (1.803 m)   Wt 205 lb 12.8 oz (93.4 kg)   SpO2 98%   BMI 28.72 kg/m   Wt Readings from Last 3 Encounters:  03/26/22 205 lb 12.8 oz (93.4 kg)  12/04/21 201 lb 3.2 oz (91.3 kg)  09/25/21 203 lb 1.6 oz (92.1 kg)    Physical Exam Vitals and nursing note reviewed.  Constitutional:      General: He is awake. He is not in acute distress.    Appearance: He is well-developed and well-groomed. He is obese. He is not ill-appearing or toxic-appearing.  HENT:     Head: Normocephalic and atraumatic.     Right Ear: Hearing normal. No drainage.     Left Ear: Hearing normal. No drainage.  Eyes:     General: Lids are normal.        Right eye: No discharge.        Left eye: No discharge.     Conjunctiva/sclera: Conjunctivae normal.     Pupils: Pupils are equal, round, and reactive to light.  Neck:     Thyroid: No thyromegaly.     Vascular: No carotid bruit.     Trachea: Trachea normal.  Cardiovascular:  Rate and Rhythm: Normal rate and regular rhythm.     Heart sounds: Normal heart sounds, S1 normal and S2 normal. No murmur heard.    No gallop.  Pulmonary:     Effort: Pulmonary effort is normal. No accessory muscle usage or respiratory distress.     Breath sounds: Normal breath sounds.  Abdominal:     General: Bowel sounds are normal.     Palpations: Abdomen is soft. There is no hepatomegaly or splenomegaly.  Musculoskeletal:        General: Normal range of motion.     Cervical back: Normal range of motion and neck supple.     Right lower leg: No edema.     Left lower leg: No edema.  Skin:     General: Skin is warm and dry.     Capillary Refill: Capillary refill takes less than 2 seconds.  Neurological:     Mental Status: He is alert and oriented to person, place, and time.     Deep Tendon Reflexes:     Reflex Scores:      Brachioradialis reflexes are 1+ on the right side and 1+ on the left side.      Patellar reflexes are 1+ on the right side and 1+ on the left side. Psychiatric:        Attention and Perception: Attention normal.        Mood and Affect: Mood normal.        Speech: Speech normal.        Behavior: Behavior normal. Behavior is cooperative.        Thought Content: Thought content normal.    Results for orders placed or performed in visit on 12/04/21  CBC with Differential/Platelet  Result Value Ref Range   WBC 4.9 3.8 - 10.8 Thousand/uL   RBC 4.50 4.20 - 5.80 Million/uL   Hemoglobin 14.2 13.2 - 17.1 g/dL   HCT 42.7 38.5 - 50.0 %   MCV 94.9 80.0 - 100.0 fL   MCH 31.6 27.0 - 33.0 pg   MCHC 33.3 32.0 - 36.0 g/dL   RDW 12.5 11.0 - 15.0 %   Platelets 280 140 - 400 Thousand/uL   MPV 9.7 7.5 - 12.5 fL   Neutro Abs 2,881 1,500 - 7,800 cells/uL   Lymphs Abs 1,039 850 - 3,900 cells/uL   Absolute Monocytes 711 200 - 950 cells/uL   Eosinophils Absolute 221 15 - 500 cells/uL   Basophils Absolute 49 0 - 200 cells/uL   Neutrophils Relative % 58.8 %   Total Lymphocyte 21.2 %   Monocytes Relative 14.5 %   Eosinophils Relative 4.5 %   Basophils Relative 1.0 %  COMPLETE METABOLIC PANEL WITH GFR  Result Value Ref Range   Glucose, Bld 98 65 - 99 mg/dL   BUN 11 7 - 25 mg/dL   Creat 0.83 0.70 - 1.35 mg/dL   eGFR 97 > OR = 60 mL/min/1.76m   BUN/Creatinine Ratio SEE NOTE: 6 - 22 (calc)   Sodium 137 135 - 146 mmol/L   Potassium 4.4 3.5 - 5.3 mmol/L   Chloride 100 98 - 110 mmol/L   CO2 28 20 - 32 mmol/L   Calcium 10.0 8.6 - 10.3 mg/dL   Total Protein 7.5 6.1 - 8.1 g/dL   Albumin 4.6 3.6 - 5.1 g/dL   Globulin 2.9 1.9 - 3.7 g/dL (calc)   AG Ratio 1.6 1.0 - 2.5  (calc)   Total Bilirubin 0.4 0.2 - 1.2 mg/dL  Alkaline phosphatase (APISO) 72 35 - 144 U/L   AST 27 10 - 35 U/L   ALT 32 9 - 46 U/L  ANA  Result Value Ref Range   Anti Nuclear Antibody (ANA) NEGATIVE NEGATIVE  Urinalysis, Routine w reflex microscopic  Result Value Ref Range   Color, Urine YELLOW YELLOW   APPearance CLEAR CLEAR   Specific Gravity, Urine 1.012 1.001 - 1.035   pH 6.0 5.0 - 8.0   Glucose, UA NEGATIVE NEGATIVE   Bilirubin Urine NEGATIVE NEGATIVE   Ketones, ur NEGATIVE NEGATIVE   Hgb urine dipstick NEGATIVE NEGATIVE   Protein, ur NEGATIVE NEGATIVE   Nitrite NEGATIVE NEGATIVE   Leukocytes,Ua NEGATIVE NEGATIVE  C3 and C4  Result Value Ref Range   C3 Complement 139 82 - 185 mg/dL   C4 Complement 30 15 - 53 mg/dL  Anti-DNA antibody, double-stranded  Result Value Ref Range   ds DNA Ab 7 (H) IU/mL  Sedimentation rate  Result Value Ref Range   Sed Rate 14 0 - 20 mm/h      Assessment & Plan:   Problem List Items Addressed This Visit       Cardiovascular and Mediastinum   Hypertension    Chronic, stable.  BP well below goal today and on lower side, have recommend he monitor BP at least 3 times a week and document for next visit as we may be able to reduce BP medications.  Discussed at length with him.  Continue current medication regimen and adjust as needed.  Focus on DASH diet.  LABS: CMP.  Refills up to date. Return in 6 months.      Relevant Medications   sildenafil (VIAGRA) 100 MG tablet   Other Relevant Orders   Comprehensive metabolic panel     Digestive   Gastroesophageal reflux disease without esophagitis    Chronic, stable with Omeprazole.  Continue current medication regimen and consider reduction in future.  Mag level annually.  Discussed the risks and benefits of long term PPI use including but not limited to bone loss, chronic kidney disease, infections, low magnesium.  Will aim to use at the lowest dose for the shortest period of time.  He has tried  reduction without success.      Relevant Medications   omeprazole (PRILOSEC) 20 MG capsule     Hematopoietic and Hemostatic   Other thrombophilia ([place])    With long term anticoagulant use.  Monitor CBC regularly and watch for increased bleeding or bruising.        Other   Current every day nicotine vaping    I have recommended complete cessation of tobacco use. I have discussed various options available for assistance with tobacco cessation including over the counter methods (Nicotine gum, patch and lozenges). We also discussed prescription options (Chantix, Nicotine Inhaler / Nasal Spray). The patient is not interested in pursuing any prescription tobacco cessation options at this time.  Referral for lung screening ordered.       Relevant Orders   Ambulatory Referral Lung Cancer Screening [facility]   Erectile dysfunction    Due to BP medications -- will trial Viagra, he has taken in past.  Sent to pharmacy and educated him on this.      History of DVT (deep vein thrombosis)    Chronic, stable on Eliquis.  Hematology consult in 2020 and was able to transition to Eliquis, which he has taken without issue and has offered benefit to lifestyle.  CMP today.  Hyperlipidemia    Chronic, ongoing.  Continue current medication regimen and adjust as needed.  Lipid panel today.      Relevant Medications   sildenafil (VIAGRA) 100 MG tablet   Other Relevant Orders   Comprehensive metabolic panel   Lipid Panel w/o Chol/HDL Ratio   SLE (systemic lupus erythematosus) ([place]) - Primary    Will continue collaboration with his rheumatologist Dr. Estanislado Pandy.  Continue current medication regimen as prescribed by them.  Recent note and labs reviewed.      Relevant Orders   Comprehensive metabolic panel     Follow up plan: Return in about 6 months (around 09/27/2022) for Annual physical after 09/26/22 -- AAA screening to discuss.

## 2022-03-26 NOTE — Assessment & Plan Note (Signed)
Chronic, ongoing. Continue current medication regimen and adjust as needed.  Lipid panel today. 

## 2022-03-27 ENCOUNTER — Other Ambulatory Visit: Payer: Self-pay | Admitting: Nurse Practitioner

## 2022-03-27 LAB — COMPREHENSIVE METABOLIC PANEL
ALT: 25 IU/L (ref 0–44)
AST: 26 IU/L (ref 0–40)
Albumin/Globulin Ratio: 1.6 (ref 1.2–2.2)
Albumin: 4.7 g/dL (ref 3.9–4.9)
Alkaline Phosphatase: 91 IU/L (ref 44–121)
BUN/Creatinine Ratio: 10 (ref 10–24)
BUN: 9 mg/dL (ref 8–27)
Bilirubin Total: 0.3 mg/dL (ref 0.0–1.2)
CO2: 22 mmol/L (ref 20–29)
Calcium: 9.9 mg/dL (ref 8.6–10.2)
Chloride: 98 mmol/L (ref 96–106)
Creatinine, Ser: 0.94 mg/dL (ref 0.76–1.27)
Globulin, Total: 2.9 g/dL (ref 1.5–4.5)
Glucose: 80 mg/dL (ref 70–99)
Potassium: 4.6 mmol/L (ref 3.5–5.2)
Sodium: 139 mmol/L (ref 134–144)
Total Protein: 7.6 g/dL (ref 6.0–8.5)
eGFR: 89 mL/min/{1.73_m2} (ref >59)

## 2022-03-27 LAB — LIPID PANEL W/O CHOL/HDL RATIO
Cholesterol, Total: 181 mg/dL (ref 100–199)
HDL: 44 mg/dL (ref >39)
LDL Chol Calc (NIH): 100 mg/dL — ABNORMAL HIGH (ref 0–99)
Triglycerides: 217 mg/dL — ABNORMAL HIGH (ref 0–149)
VLDL Cholesterol Cal: 37 mg/dL (ref 5–40)

## 2022-03-27 MED ORDER — ATORVASTATIN CALCIUM 20 MG PO TABS: 20.0000 mg | ORAL_TABLET | Freq: Every day | ORAL | 4 refills | Status: DC

## 2022-03-27 NOTE — Progress Notes (Signed)
Contacted via [place] morning Dwayne Jacobson, your labs have returned: - Kidney function, creatinine and eGFR, remains normal, as is liver function, AST and ALT.  - Cholesterol labs are above where I would like to see them.  I am going to increase your Atorvastatin to 20 MG and have you stop your 10 MG dosing.  If you have 10 MG pills left you can start taking 2 of these daily to complete them.  We will recheck levels next visit.  Any questions? Keep being awesome!!  Thank you for allowing me to participate in your care.  I appreciate you. Kindest regards, Karryn Kosinski

## 2022-04-26 NOTE — Progress Notes (Signed)
Office Visit Note  Patient: Dwayne Jacobson             Date of Birth: 12/22/1955           MRN: [id number]             PCP: Marjie Skiffannady, Jolene T, NP Referring: Marjie Skiffannady, Jolene T, NP Visit Date: 05/07/2022 Occupation: @GUAROCC @  Subjective:  Muscle spasms  History of Present Illness: Dwayne Jacobson is a 67 y.o. male with history of systemic lupus erythematosus and osteoarthritis.  Patient is taking Plaquenil 200 mg 1 tablet by mouth daily.  He is tolerating Plaquenil without any side effects and has not missed any doses recently.  He denies any signs or symptoms of a systemic lupus flare.  He has not had any symptoms of Raynaud's phenomenon recently.  He has not had any recent rashes.  He continues to see his dermatologist on a yearly basis.  He has chronic dry eyes and uses Systane eyedrops for symptomatic relief.  He has not had any sores in his mouth or nose.  He denies any swollen lymph nodes.  He denies any increased joint pain, joint swelling, or joint stiffness.  He has had increased muscle spasms in his neck and lower back.  He denies any change in activity.  He has tried performing stretching exercises with minimal relief.  He has also tried taking Tylenol.  He has not tried any topical agents or heat yet.   Activities of Daily Living:  Patient reports joint stiffness all day  Patient Reports nocturnal pain.  Difficulty dressing/grooming: Denies Difficulty climbing stairs: Denies Difficulty getting out of chair: Denies Difficulty using hands for taps, buttons, cutlery, and/or writing: Denies  Review of Systems  Constitutional:  Positive for fatigue.  HENT:  Negative for mouth sores and mouth dryness.   Eyes:  Positive for dryness.  Respiratory:  Negative for shortness of breath.   Cardiovascular:  Negative for chest pain and palpitations.  Gastrointestinal:  Negative for blood in stool, constipation and diarrhea.  Endocrine: Negative for increased urination.  Genitourinary:   Negative for involuntary urination.  Musculoskeletal:  Positive for joint pain, joint pain, myalgias, morning stiffness, muscle tenderness and myalgias. Negative for gait problem, joint swelling and muscle weakness.  Skin:  Negative for color change, rash, hair loss and sensitivity to sunlight.  Allergic/Immunologic: Negative for susceptible to infections.  Neurological:  Negative for dizziness and headaches.  Hematological:  Negative for swollen glands.  Psychiatric/Behavioral:  Negative for depressed mood and sleep disturbance. The patient is not nervous/anxious.     PMFS History:  Patient Active Problem List   Diagnosis Date Noted   Erectile dysfunction 03/26/2022   Other thrombophilia 09/25/2021   Current every day nicotine vaping 10/31/2020   Healthcare maintenance 09/19/2020   Gastroesophageal reflux disease without esophagitis 08/17/2019   Chondrocalcinosis 01/13/2017   Primary osteoarthritis of both knees 01/13/2017   History of DVT (deep vein thrombosis)    Hypertension    Hyperlipidemia    SLE (systemic lupus erythematosus) 01/18/2014    Past Medical History:  Diagnosis Date   DVT (deep venous thrombosis)    Hyperlipidemia    Hypertension    Seizures    Skin cancer    Systemic lupus erythematosus     Family History  Problem Relation Age of Onset   Diabetes Mother    Hypertension Mother    Hyperlipidemia Mother    Stroke Mother    Lung disease Mother  Heart disease Mother    Heart disease Father    Lung disease Father    Diabetes Sister    Colon polyps Maternal Grandmother    Past Surgical History:  Procedure Laterality Date   NO PAST SURGERIES     Social History   Social History Narrative   Not on file   Immunization History  Administered Date(s) Administered   Fluad Quad(high Dose 65+) 12/05/2020   Influenza,inj,Quad PF,6+ Mos 01/03/2015, 11/05/2016, 11/07/2018   PNEUMOCOCCAL CONJUGATE-20 09/25/2021   Pneumococcal Polysaccharide-23 09/29/2012    Td 03/25/2006, 08/06/2016     Objective: Vital Signs: BP 126/78 (BP Location: Left Arm, Patient Position: Sitting, Cuff Size: Normal)   Pulse (!) 56   Resp 12   Ht 5\' 11"  (1.803 m)   Wt 205 lb (93 kg)   BMI 28.59 kg/m    Physical Exam Vitals and nursing note reviewed.  Constitutional:      Appearance: He is well-developed.  HENT:     Head: Normocephalic and atraumatic.  Eyes:     Conjunctiva/sclera: Conjunctivae normal.     Pupils: Pupils are equal, round, and reactive to light.  Cardiovascular:     Rate and Rhythm: Normal rate and regular rhythm.     Heart sounds: Normal heart sounds.  Pulmonary:     Effort: Pulmonary effort is normal.     Breath sounds: Normal breath sounds.  Abdominal:     General: Bowel sounds are normal.     Palpations: Abdomen is soft.  Musculoskeletal:     Cervical back: Normal range of motion and neck supple.  Skin:    General: Skin is warm and dry.     Capillary Refill: Capillary refill takes less than 2 seconds.  Neurological:     Mental Status: He is alert and oriented to person, place, and time.  Psychiatric:        Behavior: Behavior normal.      Musculoskeletal Exam: C-spine has limited range of motion with lateral rotation.  Discomfort with extension of the C-spine.  Trapezius muscle tension and tenderness bilaterally.  No midline spinal tenderness.  Some tenderness over the right paraspinal muscles in the lumbar region noted.  Shoulder joints have good range of motion with some stiffness bilaterally.  Elbow joints, wrist joints, MCPs, PIPs, DIPs have good range of motion with no synovitis.  PIP and DIP thickening consistent with osteoarthritis of both hands.  Complete fist formation bilaterally.  Hip joints have good range of motion with no groin pain.  Knee joints have good range of motion with no warmth or effusion.  Ankle joints have good range of motion with no tenderness or joint swelling.  CDAI Exam: CDAI Score: -- Patient  Global: --; Provider Global: -- Swollen: --; Tender: -- Joint Exam 05/07/2022   No joint exam has been documented for this visit   There is currently no information documented on the homunculus. Go to the Rheumatology activity and complete the homunculus joint exam.  Investigation: No additional findings.  Imaging: No results found.  Recent Labs: Lab Results  Component Value Date   WBC 4.9 12/04/2021   HGB 14.2 12/04/2021   PLT 280 12/04/2021   NA 139 03/26/2022   K 4.6 03/26/2022   CL 98 03/26/2022   CO2 22 03/26/2022   GLUCOSE 80 03/26/2022   BUN 9 03/26/2022   CREATININE 0.94 03/26/2022   BILITOT 0.3 03/26/2022   ALKPHOS 91 03/26/2022   AST 26 03/26/2022   ALT 25  03/26/2022   PROT 7.6 03/26/2022   ALBUMIN 4.7 03/26/2022   CALCIUM 9.9 03/26/2022   GFRAA 103 03/21/2020    Speciality Comments: PLQ Eye Exam: 02/19/2022 WNL @ [facility] Follow up in 1 year  Procedures:  No procedures performed Allergies: Patient has no known allergies.    Assessment / Plan:     Visit Diagnoses: Other systemic lupus erythematosus with other organ involvement - Positive dsDNA, positive Ro. History of rash, diagnosed with lupus by Dr. Gavin Potters many years ago: He has not had any signs or symptoms of a systemic lupus flare.  He has clinically been doing well taking Plaquenil 200 mg 1 tablet by mouth daily.  He continues to tolerate Plaquenil without any side effects.  He has chronic dry eyes and uses Systane eyedrops for symptomatic relief.  He has not had any symptoms of dry mouth or oral or nasal ulcerations.  He has not had any symptoms of Raynaud's phenomenon.  Capillary refill in his fingers for 2 to 3 seconds.  No digital ulcerations were noted.  He has not had any recent rashes.  His energy level has been stable.  He has no synovitis on examination today. Lab work from 12/04/2021 was reviewed today in the office: dsDNA 7, complements within normal limits, ESR within normal limits,  ANA negative, CBC within normal limits, CMP within normal limits.  The following lab work will be obtained today for further evaluation.  He will remain on Plaquenil as prescribed.  He was advised to notify us if he develops signs or symptoms of a flare.  He will follow-up in the office in 5 months or sooner if needed.  - Plan: Protein / creatinine ratio, urine, CBC with Differential/Platelet, COMPLETE METABOLIC PANEL WITH GFR, Anti-DNA antibody, double-stranded, C3 and C4, Sedimentation rate  High risk medication use - Plaquenil 200 mg 1 tablet by mouth daily-prescribed by Dr. Pilar Plate (dermatologist). PLQ Eye Exam: 02/19/2022 WNL @ [facility] Follow up in 1 year  CBC and CMP within normal limits on 12/04/2021.  Orders for CBC and CMP were released today.  - Plan: CBC with Differential/Platelet, COMPLETE METABOLIC PANEL WITH GFR  Trapezius muscle spasm: Patient presents today with limited range of motion of the C-spine with lateral rotation as well as discomfort with extension.  No midline spinal tenderness noted.  He has trapezius muscle tension and tenderness bilaterally.  He has been experiencing muscle spasms intermittently over the past several days.  He has tried taking Tylenol and performing stretching exercises with no relief.  He has not yet tried heat or topical agents.  Patient declined trapezius trigger point injections today.  A prescription for methocarbamol 500 mg 1 tablet twice daily as needed for muscle spasms was sent to the pharmacy today.  Instructions were provided as well as the importance of avoiding driving or operating machinery while taking methocarbamol was advised.  He was advised to notify us if his symptoms persist or worsen at which time we can refer him to physical therapy or he can return for a trapezius trigger point injections.  Primary osteoarthritis of both knees - X-rays in 2018 showed moderate osteoarthritis.  He has good range of motion of both knee  joints on examination today.  No warmth or effusion was noted.  Chondrocalcinosis: No signs or symptoms of a pseudogout flare.  Other medical conditions are listed as follows:  Essential hypertension: Blood pressure was 126/78 today in the office.  Mixed hyperlipidemia  History  of DVT (deep vein thrombosis) - in 2001.  He is on long-term Xarelto.  Anticardiolipin, beta-2 GP 1 and lupus anticoagulant were negative when tested in July 2022.  Orders: Orders Placed This Encounter  Procedures   Protein / creatinine ratio, urine   CBC with Differential/Platelet   COMPLETE METABOLIC PANEL WITH GFR   Anti-DNA antibody, double-stranded   C3 and C4   Sedimentation rate   Meds ordered this encounter  Medications   methocarbamol (ROBAXIN) 500 MG tablet    Sig: Take 1 tablet (500 mg total) by mouth 2 (two) times daily as needed for muscle spasms.    Dispense:  30 tablet    Refill:  0     Follow-Up Instructions: Return in about 5 months (around 10/07/2022) for Systemic lupus erythematosus.   Gearldine Bienenstockaylor M Karine Garn, PA-C  Note - This record has been created using Dragon software.  Chart creation errors have been sought, but may not always  have been located. Such creation errors do not reflect on  the standard of medical care.

## 2022-05-07 ENCOUNTER — Encounter: Payer: Self-pay | Admitting: Physician Assistant

## 2022-05-07 ENCOUNTER — Ambulatory Visit: Payer: No Typology Code available for payment source | Attending: Physician Assistant | Admitting: Physician Assistant

## 2022-05-07 VITALS — BP 126/78 | HR 56 | Resp 12 | Ht 71.0 in | Wt 205.0 lb

## 2022-05-07 DIAGNOSIS — I10 Essential (primary) hypertension: Secondary | ICD-10-CM

## 2022-05-07 DIAGNOSIS — M170 Bilateral primary osteoarthritis of knee: Secondary | ICD-10-CM | POA: Diagnosis not present

## 2022-05-07 DIAGNOSIS — Z79899 Other long term (current) drug therapy: Secondary | ICD-10-CM

## 2022-05-07 DIAGNOSIS — M62838 Other muscle spasm: Secondary | ICD-10-CM

## 2022-05-07 DIAGNOSIS — E782 Mixed hyperlipidemia: Secondary | ICD-10-CM

## 2022-05-07 DIAGNOSIS — M1120 Other chondrocalcinosis, unspecified site: Secondary | ICD-10-CM

## 2022-05-07 DIAGNOSIS — M3219 Other organ or system involvement in systemic lupus erythematosus: Secondary | ICD-10-CM | POA: Diagnosis not present

## 2022-05-07 DIAGNOSIS — Z86718 Personal history of other venous thrombosis and embolism: Secondary | ICD-10-CM

## 2022-05-07 MED ORDER — METHOCARBAMOL 500 MG PO TABS: 500.0000 mg | ORAL_TABLET | Freq: Two times a day (BID) | ORAL | 0 refills | Status: DC | PRN

## 2022-05-07 NOTE — Progress Notes (Signed)
CBC WNL

## 2022-05-08 LAB — CBC WITH DIFFERENTIAL/PLATELET
Absolute Monocytes: 684 cells/uL (ref 200–950)
Basophils Absolute: 52 cells/uL (ref 0–200)
Basophils Relative: 0.9 %
Eosinophils Absolute: 180 cells/uL (ref 15–500)
Eosinophils Relative: 3.1 %
HCT: 43.7 % (ref 38.5–50.0)
Hemoglobin: 14.8 g/dL (ref 13.2–17.1)
Lymphs Abs: 1085 cells/uL (ref 850–3900)
MCH: 31 pg (ref 27.0–33.0)
MCHC: 33.9 g/dL (ref 32.0–36.0)
MCV: 91.6 fL (ref 80.0–100.0)
MPV: 9.7 fL (ref 7.5–12.5)
Monocytes Relative: 11.8 %
Neutro Abs: 3799 cells/uL (ref 1500–7800)
Neutrophils Relative %: 65.5 %
Platelets: 278 10*3/uL (ref 140–400)
RBC: 4.77 10*6/uL (ref 4.20–5.80)
RDW: 12.5 % (ref 11.0–15.0)
Total Lymphocyte: 18.7 %
WBC: 5.8 10*3/uL (ref 3.8–10.8)

## 2022-05-08 LAB — SEDIMENTATION RATE: Sed Rate: 11 mm/h (ref 0–20)

## 2022-05-08 LAB — COMPLETE METABOLIC PANEL WITH GFR
AG Ratio: 1.6 (calc) (ref 1.0–2.5)
ALT: 25 U/L (ref 9–46)
AST: 25 U/L (ref 10–35)
Albumin: 4.8 g/dL (ref 3.6–5.1)
Alkaline phosphatase (APISO): 79 U/L (ref 35–144)
BUN: 10 mg/dL (ref 7–25)
CO2: 28 mmol/L (ref 20–32)
Calcium: 10 mg/dL (ref 8.6–10.3)
Chloride: 98 mmol/L (ref 98–110)
Creat: 0.87 mg/dL (ref 0.70–1.35)
Globulin: 3 g/dL (calc) (ref 1.9–3.7)
Glucose, Bld: 97 mg/dL (ref 65–99)
Potassium: 4.8 mmol/L (ref 3.5–5.3)
Sodium: 136 mmol/L (ref 135–146)
Total Bilirubin: 0.4 mg/dL (ref 0.2–1.2)
Total Protein: 7.8 g/dL (ref 6.1–8.1)
eGFR: 95 mL/min/{1.73_m2} (ref >=60)

## 2022-05-08 LAB — PROTEIN / CREATININE RATIO, URINE
Creatinine, Urine: 52 mg/dL (ref 20–320)
Total Protein, Urine: <4 mg/dL — ABNORMAL LOW (ref 5–25)

## 2022-05-08 LAB — ANTI-DNA ANTIBODY, DOUBLE-STRANDED: ds DNA Ab: 4 IU/mL

## 2022-05-08 LAB — C3 AND C4
C3 Complement: 136 mg/dL (ref 82–185)
C4 Complement: 27 mg/dL (ref 15–53)

## 2022-05-09 NOTE — Progress Notes (Signed)
No proteinuria.  CMP WNL.  Complements WNL. ESR WNL dsDNA is negative.  Labs are not consistent with a flare.  No change in treatment at this time.

## 2022-05-26 ENCOUNTER — Ambulatory Visit
Admission: RE | Admit: 2022-05-26 | Discharge: 2022-05-26 | Disposition: A | Discharge status: Home / Self Care | Payer: No Typology Code available for payment source | Source: Ambulatory Visit | Attending: Nurse Practitioner | Admitting: Nurse Practitioner

## 2022-05-26 ENCOUNTER — Ambulatory Visit: Payer: No Typology Code available for payment source | Admitting: Nurse Practitioner

## 2022-05-26 ENCOUNTER — Encounter: Payer: Self-pay | Admitting: Nurse Practitioner

## 2022-05-26 ENCOUNTER — Ambulatory Visit
Admission: RE | Admit: 2022-05-26 | Discharge: 2022-05-26 | Disposition: A | Discharge status: Home / Self Care | Payer: No Typology Code available for payment source | Attending: Nurse Practitioner | Admitting: Nurse Practitioner

## 2022-05-26 VITALS — BP 122/80 | HR 53 | Temp 98.0°F | Ht 70.98 in | Wt 206.8 lb

## 2022-05-26 DIAGNOSIS — M542 Cervicalgia: Secondary | ICD-10-CM | POA: Insufficient documentation

## 2022-05-26 MED ORDER — HYDROCODONE-ACETAMINOPHEN 5-325 MG PO TABS: 1.0000 | ORAL_TABLET | Freq: Four times a day (QID) | ORAL | 0 refills | Status: AC | PRN

## 2022-05-26 NOTE — Progress Notes (Signed)
BP 122/80   Pulse (!) 53   Temp 98 F (36.7 C) (Oral)   Ht 5' 10.98" (1.803 m)   Wt 206 lb 12.8 oz (93.8 kg)   SpO2 98%   BMI 28.86 kg/m    Subjective:    Patient ID: Dwayne Jacobson, male    DOB: 02-14-1955, 67 y.o.   MRN: [id number]  HPI: Dwayne Jacobson is a 67 y.o. male  Chief Complaint  Patient presents with   Neck Pain    Radiating to the left hand,started about 3 weeks ago. Patient does not remember any injury   NECK PAIN  Presents for neck pain for 3 weeks, no recent injuries or falls.  Denies any recent heavy lifting.  Started during his normal routine.  Denies CP, SOB, or diaphoresis.  Pain is present more with extension of neck, then radiates down arm + with lateral right. Treatments attempted: Salon Pas patch which helps slightly, rheumatologist gave muscle relaxer - no benefit Relief with NSAIDs?:  No NSAIDs Taken Location:Left, L>R, and midline Duration:weeks Severity: 6/10 Quality: sharp, dull, aching, burning, and shooting Frequency: constant Radiation: L arm down to fingers Aggravating factors: movement, laying, and bending Alleviating factors: as above Weakness:  no Paresthesias / decreased sensation:  yes -- into left fingers Fevers:  no   Relevant past medical, surgical, family and social history reviewed and updated as indicated. Interim medical history since our last visit reviewed. Allergies and medications reviewed and updated.  Review of Systems  Constitutional:  Negative for activity change, diaphoresis, fatigue and fever.  Respiratory:  Negative for cough, chest tightness, shortness of breath and wheezing.   Cardiovascular:  Negative for chest pain, palpitations and leg swelling.  Gastrointestinal: Negative.   Musculoskeletal:  Positive for neck pain.  Neurological: Negative.   Psychiatric/Behavioral: Negative.      Per HPI unless specifically indicated above     Objective:    BP 122/80   Pulse (!) 53   Temp 98 F (36.7 C) (Oral)    Ht 5' 10.98" (1.803 m)   Wt 206 lb 12.8 oz (93.8 kg)   SpO2 98%   BMI 28.86 kg/m   Wt Readings from Last 3 Encounters:  05/26/22 206 lb 12.8 oz (93.8 kg)  05/07/22 205 lb (93 kg)  03/26/22 205 lb 12.8 oz (93.4 kg)    Physical Exam Vitals and nursing note reviewed.  Constitutional:      General: He is awake. He is not in acute distress.    Appearance: He is well-developed and well-groomed. He is not ill-appearing.  HENT:     Head: Normocephalic and atraumatic.     Right Ear: Hearing, ear canal and external ear normal. No drainage.     Left Ear: Hearing, ear canal and external ear normal. No drainage.     Nose: Nose normal.     Mouth/Throat:     Pharynx: Uvula midline.  Eyes:     General: Lids are normal.        Right eye: No discharge.        Left eye: No discharge.     Extraocular Movements: Extraocular movements intact.     Conjunctiva/sclera: Conjunctivae normal.     Pupils: Pupils are equal, round, and reactive to light.     Visual Fields: Right eye visual fields normal and left eye visual fields normal.  Neck:     Thyroid: No thyromegaly.     Vascular: No carotid bruit or JVD.  Trachea: Trachea normal.  Cardiovascular:     Rate and Rhythm: Normal rate and regular rhythm.     Heart sounds: Normal heart sounds, S1 normal and S2 normal. No murmur heard.    No gallop.  Pulmonary:     Effort: Pulmonary effort is normal. No accessory muscle usage or respiratory distress.     Breath sounds: Normal breath sounds.  Abdominal:     General: Bowel sounds are normal.     Palpations: Abdomen is soft. There is no hepatomegaly or splenomegaly.     Tenderness: There is no abdominal tenderness.  Musculoskeletal:     Cervical back: Neck supple. No edema, erythema or torticollis. Pain with movement (with extension pain and lateral right, improved pain with flexion) and muscular tenderness present. No spinous process tenderness. Decreased range of motion.     Right lower leg: No  edema.     Left lower leg: No edema.  Lymphadenopathy:     Head:     Right side of head: No submental, submandibular, tonsillar, preauricular or posterior auricular adenopathy.     Left side of head: No submental, submandibular, tonsillar, preauricular or posterior auricular adenopathy.     Cervical: No cervical adenopathy.  Skin:    General: Skin is warm and dry.     Capillary Refill: Capillary refill takes less than 2 seconds.     Findings: No rash.  Neurological:     Mental Status: He is alert and oriented to person, place, and time.     Cranial Nerves: Cranial nerves 2-12 are intact.     Sensory: Sensation is intact.     Gait: Gait is intact.     Deep Tendon Reflexes: Reflexes are normal and symmetric.     Reflex Scores:      Brachioradialis reflexes are 2+ on the right side and 2+ on the left side.      Patellar reflexes are 2+ on the right side and 2+ on the left side. Psychiatric:        Attention and Perception: Attention normal.        Mood and Affect: Mood normal.        Speech: Speech normal.        Behavior: Behavior normal. Behavior is cooperative.        Thought Content: Thought content normal.        Cognition and Memory: Cognition normal.        Judgment: Judgment normal.    Results for orders placed or performed in visit on 05/07/22  Protein / creatinine ratio, urine  Result Value Ref Range   Creatinine, Urine 52 20 - 320 mg/dL   Protein/Creat Ratio NOTE 25 - 148 mg/g creat   Protein/Creatinine Ratio NOTE 0.025 - 0.148 mg/mg creat   Total Protein, Urine <4 (L) 5 - 25 mg/dL  CBC with Differential/Platelet  Result Value Ref Range   WBC 5.8 3.8 - 10.8 Thousand/uL   RBC 4.77 4.20 - 5.80 Million/uL   Hemoglobin 14.8 13.2 - 17.1 g/dL   HCT 16.1 09.6 - 04.5 %   MCV 91.6 80.0 - 100.0 fL   MCH 31.0 27.0 - 33.0 pg   MCHC 33.9 32.0 - 36.0 g/dL   RDW 40.9 81.1 - 91.4 %   Platelets 278 140 - 400 Thousand/uL   MPV 9.7 7.5 - 12.5 fL   Neutro Abs 3,799 1,500 - 7,800  cells/uL   Lymphs Abs 1,085 850 - 3,900 cells/uL   Absolute Monocytes 684  200 - 950 cells/uL   Eosinophils Absolute 180 15 - 500 cells/uL   Basophils Absolute 52 0 - 200 cells/uL   Neutrophils Relative % 65.5 %   Total Lymphocyte 18.7 %   Monocytes Relative 11.8 %   Eosinophils Relative 3.1 %   Basophils Relative 0.9 %  COMPLETE METABOLIC PANEL WITH GFR  Result Value Ref Range   Glucose, Bld 97 65 - 99 mg/dL   BUN 10 7 - 25 mg/dL   Creat 1.61 0.96 - 0.45 mg/dL   eGFR 95 > OR = 60 WU/JWJ/1.91Y7   BUN/Creatinine Ratio SEE NOTE: 6 - 22 (calc)   Sodium 136 135 - 146 mmol/L   Potassium 4.8 3.5 - 5.3 mmol/L   Chloride 98 98 - 110 mmol/L   CO2 28 20 - 32 mmol/L   Calcium 10.0 8.6 - 10.3 mg/dL   Total Protein 7.8 6.1 - 8.1 g/dL   Albumin 4.8 3.6 - 5.1 g/dL   Globulin 3.0 1.9 - 3.7 g/dL (calc)   AG Ratio 1.6 1.0 - 2.5 (calc)   Total Bilirubin 0.4 0.2 - 1.2 mg/dL   Alkaline phosphatase (APISO) 79 35 - 144 U/L   AST 25 10 - 35 U/L   ALT 25 9 - 46 U/L  Anti-DNA antibody, double-stranded  Result Value Ref Range   ds DNA Ab 4 IU/mL  C3 and C4  Result Value Ref Range   C3 Complement 136 82 - 185 mg/dL   C4 Complement 27 15 - 53 mg/dL  Sedimentation rate  Result Value Ref Range   Sed Rate 11 0 - 20 mm/h      Assessment & Plan:   Problem List Items Addressed This Visit       Other   Neck pain - Primary    For 3 weeks with radiculopathy to left fingers.  Will obtain imaging.  Pain worsens with extension and lateral right, improves with flexion.  Suspect nerve impingement/arthritic changes.  Start Prednisone taper and send in 5 days of Norco to use only as needed for severe pain -- recommend he not take if at work and only when at home.  Discussed gentle exercise to help with discomfort.  Continue Salon Pas patches.  Return in 2 weeks.  Consider PT if ongoing.      Relevant Orders   DG Cervical Spine Complete     Follow up plan: Return in about 2 weeks (around 06/09/2022) for NECK  PAIN.

## 2022-05-26 NOTE — Assessment & Plan Note (Signed)
For 3 weeks with radiculopathy to left fingers.  Will obtain imaging.  Pain worsens with extension and lateral right, improves with flexion.  Suspect nerve impingement/arthritic changes.  Start Prednisone taper and send in 5 days of Norco to use only as needed for severe pain -- recommend he not take if at work and only when at home.  Discussed gentle exercise to help with discomfort.  Continue Salon Pas patches.  Return in 2 weeks.  Consider PT if ongoing.

## 2022-05-27 ENCOUNTER — Other Ambulatory Visit: Payer: Self-pay | Admitting: *Deleted

## 2022-05-27 DIAGNOSIS — Z122 Encounter for screening for malignant neoplasm of respiratory organs: Secondary | ICD-10-CM

## 2022-05-27 DIAGNOSIS — Z87891 Personal history of nicotine dependence: Secondary | ICD-10-CM

## 2022-05-30 ENCOUNTER — Encounter: Payer: Self-pay | Admitting: Nurse Practitioner

## 2022-05-30 DIAGNOSIS — M5030 Other cervical disc degeneration, unspecified cervical region: Secondary | ICD-10-CM | POA: Insufficient documentation

## 2022-05-30 NOTE — Progress Notes (Signed)
Contacted via MyChart   Good afternoon Vasili, your imaging has returned.  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  I suspect this is current cause of discomfort and the numbness to fingers.  If ongoing then would recommend physical therapy.  Any questions? Keep being amazing!!  Thank you for allowing me to participate in your care.  I appreciate you. Kindest regards, Kiylah Loyer

## 2022-06-06 NOTE — Patient Instructions (Addendum)
AREA MENTAL HEALTH PROVIDERS:  [facility] [place], MSW, LCSW Phone:[phone] Fax:[phone]  [facility] [address] [place], MSW, [place], [place] [address] [place], [place] [address] [phone]  [facility] Marcell Anger, Psy.D., M.Div., S.D. [address] [place], [place] [address] Phone:[phone]  Psychologist Elna Breslow, PhD  [address] [place], [place] [address] [phone] After [phone]  [facility] [address] [place], [place] [address] Phone:[phone] or [phone] [phone] Services offered at their [place] office.   [facility] (Pediatrics/Adolescents & Adults) [facility] [address] [place], [place] [address] Phone: [phone]  [facility] [address] [place], [place] [address] Phone:[phone]  [facility] [address] [place], [place] [address]  Phone:[phone]  [facility] [address] [place], [place] [address] [phone]  [facility] [address] [place], [place] [address] [phone]  [address] in Mental Healthcare Locations: [place], [place], [place], [place], [place], [place], [place], [place], [place], [place], Therapist, nutritional. [phone]  Psychiatry Dr. Fayrene Helper, [place]  Phone:[phone]  Dr. Maryruth Bun  [address] [place], [place] [address] [phone] Fax:[phone]  [facility] [address] [place] [place], [place] [address] Phone:[phone]  [facility] Windy Fast. Bowman PhD, Licensed Psychologist  [address]  [place], [place]  [address]Phone:[phone] Email:[email or web address]   Cervical Radiculopathy  Cervical radiculopathy happens when a nerve in the neck (a cervical nerve) is pinched or bruised. This condition can happen because of an injury to the cervical spine (vertebrae) in the neck, or as part of the normal aging process. Pressure on the cervical nerves can cause pain or numbness that travels from the neck all the way down to the arm and fingers. This condition usually gets better with rest. Treatment may be needed if the condition does not improve. What are the causes? This condition may be caused by: A neck injury. A bulging (herniated) disk. Muscle spasms. Muscle tightness in the neck due to overuse. Arthritis. Breakdown or degeneration in the bones and joints of the spine (spondylosis) due to aging. Bone spurs that may develop near the cervical nerves. What are the signs or symptoms? Symptoms of this condition include: Pain. The pain may travel from the neck to the arm and hand. The pain can be severe or irritating. It may get worse when you move your neck. Numbness or tingling in your arm or hand. Weakness in the affected arm and hand, in severe cases. How is this diagnosed? This condition may be diagnosed based on your symptoms, your medical history, and a physical exam. You may also have tests, including: X-rays. CT scan. MRI. Electromyogram (EMG). Nerve conduction tests. How is this treated? In many cases, treatment is not needed for this condition. With rest, the condition usually gets better over time. If treatment is needed, options may include: Wearing a soft neck collar (cervical collar) for short periods of time. Doing physical therapy to strengthen your neck muscles. Taking medicines. These may include NSAIDs, such as ibuprofen, or oral corticosteroids. Having spinal injections, in severe cases. Having surgery. This may be needed if other  treatments do not help. Different types of surgery may be done depending on the cause of this condition. Follow these instructions at home: If you have a cervical collar: Wear it as told by your health care provider. Remove it only as told by your  health care provider. Ask your health care provider if you can remove the cervical collar for cleaning and bathing. If you are allowed to remove the collar for cleaning or bathing: Follow instructions from your health care provider about how to remove the collar safely. Clean the collar by wiping it with mild soap and water and drying it completely. Take out any removable pads in the collar every 1-2 days, and wash them by hand with soap and water. Let them air-dry completely before you put them back in the collar. Check your skin under the collar for irritation or sores. If you see any, tell your health care provider. Managing pain     Take over-the-counter and prescription medicines only as told by your health care provider. If directed, put ice on the affected area. To do this: If you have a soft neck collar, remove it as told by your health care provider. Put ice in a plastic bag. Place a towel between your skin and the bag. Leave the ice on for 20 minutes, 2-3 times a day. Remove the ice if your skin turns bright red. This is very important. If you cannot feel pain, heat, or cold, you have a greater risk of damage to the area. If applying ice does not help, you can try using heat. Use the heat source that your health care provider recommends, such as a moist heat pack or a heating pad. Place a towel between your skin and the heat source. Leave the heat on for 20-30 minutes. Remove the heat if your skin turns bright red. This is especially important if you are unable to feel pain, heat, or cold. You have a greater risk of getting burned. Try a gentle neck and shoulder massage to help relieve symptoms. Activity Rest as needed. Return to your  normal activities as told by your health care provider. Ask your health care provider what activities are safe for you. Do stretching and strengthening exercises as told by your health care provider or your physical therapist. You may have to avoid lifting. Ask your health care provider how much you can safely lift. General instructions Use a flat pillow when you sleep. Do not drive while wearing a cervical collar. If you do not have a cervical collar, ask your health care provider if it is safe to drive while your neck heals. Ask your health care provider if the medicine prescribed to you requires you to avoid driving or using machinery. Do not use any products that contain nicotine or tobacco. These products include cigarettes, chewing tobacco, and vaping devices, such as e-cigarettes. If you need help quitting, ask your health care provider. Keep all follow-up visits. This is important. Contact a health care provider if: Your condition does not improve with treatment. Get help right away if: Your pain gets much worse and is not controlled with medicines. You have weakness or numbness in your hand, arm, face, or leg. You have a high fever. You have a stiff, rigid neck. You lose control of your bowels or your bladder (have incontinence). You have trouble with walking, balance, or speaking. Summary Cervical radiculopathy happens when a nerve in the neck is pinched or bruised. A nerve can get pinched from a bulging disk, arthritis, muscle spasms, or an injury to the neck. Symptoms include pain, tingling, or numbness radiating from the neck to the arm or hand. Weakness can also occur in severe cases. Treatment may include rest, wearing a cervical collar, and physical therapy. Medicines  may be prescribed to help with pain. In severe cases, injections or surgery may be needed. This information is not intended to replace advice given to you by your health care provider. Make sure you discuss any  questions you have with your health care provider. Document Revised: 07/24/2020 Document Reviewed: 07/24/2020 Elsevier Patient Education  2023 [employer].

## 2022-06-11 ENCOUNTER — Encounter: Payer: Self-pay | Admitting: Nurse Practitioner

## 2022-06-11 ENCOUNTER — Ambulatory Visit: Payer: No Typology Code available for payment source | Admitting: Nurse Practitioner

## 2022-06-11 VITALS — BP 115/74 | HR 62 | Temp 97.6°F | Ht 70.98 in | Wt 203.0 lb

## 2022-06-11 DIAGNOSIS — M542 Cervicalgia: Secondary | ICD-10-CM | POA: Diagnosis not present

## 2022-06-11 DIAGNOSIS — M5030 Other cervical disc degeneration, unspecified cervical region: Secondary | ICD-10-CM

## 2022-06-11 MED ORDER — HYDROCODONE-ACETAMINOPHEN 5-325 MG PO TABS: 1.0000 | ORAL_TABLET | Freq: Four times a day (QID) | ORAL | 0 refills | Status: AC | PRN

## 2022-06-11 NOTE — Assessment & Plan Note (Signed)
For >4 weeks with radiculopathy to left fingers. No red flags.  Imaging did note degenerative changes.  Pain worsens with extension and lateral right, improves with flexion.  Suspect nerve impingement/arthritic changes.  Sent in 5 day refill on Norco.  Recommend Tylenol as needed, up to 3000 MG max daily (this includes adding in the 325 MG in Norco).  Discussed gentle exercise to help with discomfort.  Continue Salon Pas patches.  He would like to start out with PT, referral placed.  If no benefit and ongoing pain, then will consider obtaining MRI and sending to neurosurgery or physiatry.

## 2022-06-11 NOTE — Progress Notes (Signed)
BP 115/74   Pulse 62   Temp 97.6 F (36.4 C) (Oral)   Ht 5' 10.98" (1.803 m)   Wt 203 lb (92.1 kg)   SpO2 96%   BMI 28.33 kg/m    Subjective:    Patient ID: Dwayne Jacobson, male    DOB: 1955-11-19, 67 y.o.   MRN: [id number]  HPI: Dwayne Jacobson is a 67 y.o. male  Chief Complaint  Patient presents with   Neck Pain    2 weeks follow up. Patient states that his pain has not gotten any better.   NECK PAIN  Follow-up for neck pain, initially seen 05/26/22 -- was started on Prednisone taper and Norco.  Did have imaging noting moderate multilevel degenerative disc disease in the mid to lower cervical spine, prominent at C6-C7.  Neck pain for >4 weeks now, no recent injuries or falls.  Denies any recent heavy lifting.  Started during his normal routine.  Denies CP, SOB, or diaphoresis.  Pain is present more with extension of neck, then radiates down arm + with lateral right. Treatments attempted: Salon Pas patch which helps slightly, rheumatologist gave muscle relaxer - no benefit.  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  Norco has offered some benefit. Relief with NSAIDs?:  No NSAIDs Taken Location:Left, L>R, and midline Duration:weeks Severity: 6/10 -- worse in the afternoon Quality: sharp, dull, aching, burning, and shooting Frequency: constant Radiation: L arm down to fingers Aggravating factors: movement, laying, and bending Alleviating factors: as above Weakness:  no Paresthesias / decreased sensation:  yes -- into left fingers Fevers:  no   Relevant past medical, surgical, family and social history reviewed and updated as indicated. Interim medical history since our last visit reviewed. Allergies and medications reviewed and updated.  Review of Systems  Constitutional:  Negative for activity change, diaphoresis, fatigue and fever.  Respiratory:  Negative for cough, chest tightness, shortness of breath and wheezing.   Cardiovascular:  Negative for chest pain, palpitations and  leg swelling.  Gastrointestinal: Negative.   Musculoskeletal:  Positive for neck pain.  Neurological: Negative.   Psychiatric/Behavioral: Negative.      Per HPI unless specifically indicated above     Objective:    BP 115/74   Pulse 62   Temp 97.6 F (36.4 C) (Oral)   Ht 5' 10.98" (1.803 m)   Wt 203 lb (92.1 kg)   SpO2 96%   BMI 28.33 kg/m   Wt Readings from Last 3 Encounters:  06/11/22 203 lb (92.1 kg)  05/26/22 206 lb 12.8 oz (93.8 kg)  05/07/22 205 lb (93 kg)    Physical Exam Vitals and nursing note reviewed.  Constitutional:      General: He is awake. He is not in acute distress.    Appearance: He is well-developed and well-groomed. He is not ill-appearing.  HENT:     Head: Normocephalic and atraumatic.     Right Ear: Hearing, ear canal and external ear normal. No drainage.     Left Ear: Hearing, ear canal and external ear normal. No drainage.     Nose: Nose normal.     Mouth/Throat:     Pharynx: Uvula midline.  Eyes:     General: Lids are normal.        Right eye: No discharge.        Left eye: No discharge.     Extraocular Movements: Extraocular movements intact.     Conjunctiva/sclera: Conjunctivae normal.     Pupils: Pupils are  equal, round, and reactive to light.     Visual Fields: Right eye visual fields normal and left eye visual fields normal.  Neck:     Thyroid: No thyromegaly.     Vascular: No carotid bruit or JVD.     Trachea: Trachea normal.  Cardiovascular:     Rate and Rhythm: Normal rate and regular rhythm.     Heart sounds: Normal heart sounds, S1 normal and S2 normal. No murmur heard.    No gallop.  Pulmonary:     Effort: Pulmonary effort is normal. No accessory muscle usage or respiratory distress.     Breath sounds: Normal breath sounds.  Abdominal:     General: Bowel sounds are normal.     Palpations: Abdomen is soft. There is no hepatomegaly or splenomegaly.     Tenderness: There is no abdominal tenderness.  Musculoskeletal:      Cervical back: Neck supple. No edema, erythema or torticollis. Pain with movement (with extension pain and lateral right, improved pain with flexion) and muscular tenderness present. No spinous process tenderness. Decreased range of motion.     Right lower leg: No edema.     Left lower leg: No edema.  Lymphadenopathy:     Head:     Right side of head: No submental, submandibular, tonsillar, preauricular or posterior auricular adenopathy.     Left side of head: No submental, submandibular, tonsillar, preauricular or posterior auricular adenopathy.     Cervical: No cervical adenopathy.  Skin:    General: Skin is warm and dry.     Capillary Refill: Capillary refill takes less than 2 seconds.     Findings: No rash.  Neurological:     Mental Status: He is alert and oriented to person, place, and time.     Cranial Nerves: Cranial nerves 2-12 are intact.     Sensory: Sensation is intact.     Gait: Gait is intact.     Deep Tendon Reflexes: Reflexes are normal and symmetric.     Reflex Scores:      Brachioradialis reflexes are 2+ on the right side and 2+ on the left side.      Patellar reflexes are 2+ on the right side and 2+ on the left side. Psychiatric:        Attention and Perception: Attention normal.        Mood and Affect: Mood normal.        Speech: Speech normal.        Behavior: Behavior normal. Behavior is cooperative.        Thought Content: Thought content normal.        Cognition and Memory: Cognition normal.        Judgment: Judgment normal.    Results for orders placed or performed in visit on 05/07/22  Protein / creatinine ratio, urine  Result Value Ref Range   Creatinine, Urine 52 20 - 320 mg/dL   Protein/Creat Ratio NOTE 25 - 148 mg/g creat   Protein/Creatinine Ratio NOTE 0.025 - 0.148 mg/mg creat   Total Protein, Urine <4 (L) 5 - 25 mg/dL  CBC with Differential/Platelet  Result Value Ref Range   WBC 5.8 3.8 - 10.8 Thousand/uL   RBC 4.77 4.20 - 5.80 Million/uL    Hemoglobin 14.8 13.2 - 17.1 g/dL   HCT 95.6 21.3 - 08.6 %   MCV 91.6 80.0 - 100.0 fL   MCH 31.0 27.0 - 33.0 pg   MCHC 33.9 32.0 - 36.0 g/dL  RDW 12.5 11.0 - 15.0 %   Platelets 278 140 - 400 Thousand/uL   MPV 9.7 7.5 - 12.5 fL   Neutro Abs 3,799 1,500 - 7,800 cells/uL   Lymphs Abs 1,085 850 - 3,900 cells/uL   Absolute Monocytes 684 200 - 950 cells/uL   Eosinophils Absolute 180 15 - 500 cells/uL   Basophils Absolute 52 0 - 200 cells/uL   Neutrophils Relative % 65.5 %   Total Lymphocyte 18.7 %   Monocytes Relative 11.8 %   Eosinophils Relative 3.1 %   Basophils Relative 0.9 %  COMPLETE METABOLIC PANEL WITH GFR  Result Value Ref Range   Glucose, Bld 97 65 - 99 mg/dL   BUN 10 7 - 25 mg/dL   Creat 1.61 0.96 - 0.45 mg/dL   eGFR 95 > OR = 60 WU/JWJ/1.91Y7   BUN/Creatinine Ratio SEE NOTE: 6 - 22 (calc)   Sodium 136 135 - 146 mmol/L   Potassium 4.8 3.5 - 5.3 mmol/L   Chloride 98 98 - 110 mmol/L   CO2 28 20 - 32 mmol/L   Calcium 10.0 8.6 - 10.3 mg/dL   Total Protein 7.8 6.1 - 8.1 g/dL   Albumin 4.8 3.6 - 5.1 g/dL   Globulin 3.0 1.9 - 3.7 g/dL (calc)   AG Ratio 1.6 1.0 - 2.5 (calc)   Total Bilirubin 0.4 0.2 - 1.2 mg/dL   Alkaline phosphatase (APISO) 79 35 - 144 U/L   AST 25 10 - 35 U/L   ALT 25 9 - 46 U/L  Anti-DNA antibody, double-stranded  Result Value Ref Range   ds DNA Ab 4 IU/mL  C3 and C4  Result Value Ref Range   C3 Complement 136 82 - 185 mg/dL   C4 Complement 27 15 - 53 mg/dL  Sedimentation rate  Result Value Ref Range   Sed Rate 11 0 - 20 mm/h      Assessment & Plan:   Problem List Items Addressed This Visit       Musculoskeletal and Integument   Degeneration of intervertebral disc 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 No red flags.  Imaging did note degenerative changes.  Pain worsens with extension and lateral right, improves with flexion.  Suspect nerve impingement/arthritic changes.  Sent in  5 day refill on Norco.  Recommend Tylenol as needed, up to 3000 MG max daily (this includes adding in the 325 MG in Norco).  Discussed gentle exercise to help with discomfort.  Continue Salon Pas patches.  He would like to start out with PT, referral placed.  If no benefit and ongoing pain, then will consider obtaining MRI and sending to neurosurgery or physiatry.      Relevant Medications   HYDROcodone-acetaminophen (NORCO) 5-325 MG tablet   Other Relevant Orders   Ambulatory referral to Physical Therapy     Other   RESOLVED: Neck pain     Follow up plan: Return in about 3 weeks (around 07/02/2022) for NECK PAIN.

## 2022-06-15 ENCOUNTER — Telehealth: Payer: Self-pay

## 2022-06-15 NOTE — Telephone Encounter (Signed)
PA for Hydrocodone-Acetaminophen 5-325mg  has been approved

## 2022-06-18 ENCOUNTER — Encounter: Payer: Self-pay | Admitting: Physician Assistant

## 2022-06-18 ENCOUNTER — Ambulatory Visit
Admission: RE | Admit: 2022-06-18 | Discharge: 2022-06-18 | Disposition: A | Discharge status: Home / Self Care | Payer: No Typology Code available for payment source | Source: Ambulatory Visit | Attending: Acute Care | Admitting: Acute Care

## 2022-06-18 ENCOUNTER — Ambulatory Visit (INDEPENDENT_AMBULATORY_CARE_PROVIDER_SITE_OTHER): Payer: No Typology Code available for payment source | Admitting: Physician Assistant

## 2022-06-18 DIAGNOSIS — Z122 Encounter for screening for malignant neoplasm of respiratory organs: Secondary | ICD-10-CM

## 2022-06-18 DIAGNOSIS — F17211 Nicotine dependence, cigarettes, in remission: Secondary | ICD-10-CM

## 2022-06-18 DIAGNOSIS — Z87891 Personal history of nicotine dependence: Secondary | ICD-10-CM

## 2022-06-18 NOTE — Progress Notes (Signed)
Virtual Visit via Telephone Note  I connected with [name] on 06/18/22 at  9:30 AM EDT by telephone and verified that I am speaking with the correct person using two identifiers.  Location: Patient: home Provider: working virtually from home   I discussed the limitations, risks, security and privacy concerns of performing an evaluation and management service by telephone and the availability of in person appointments. I also discussed with the patient that there may be a patient responsible charge related to this service. The patient expressed understanding and agreed to proceed.     Shared Decision Making Visit Lung Cancer Screening Program [phone])   Eligibility: Age 67 y.o. Pack Years Smoking History Calculation 43 (# packs/per year x # years smoked) Recent History of coughing up blood  no Unexplained weight loss? no ( >Than 15 pounds within the last 6 months ) Prior History Lung / other cancer no (Diagnosis within the last 5 years already requiring surveillance chest CT Scans). Smoking Status Former Smoker Former Smokers: Years since quit: 9 years  Quit Date: 2015  Visit Components: Discussion included one or more decision making aids. yes Discussion included risk/benefits of screening. yes Discussion included potential follow up diagnostic testing for abnormal scans. yes Discussion included meaning and risk of over diagnosis. yes Discussion included meaning and risk of False Positives. yes Discussion included meaning of total radiation exposure. yes  Counseling Included: Importance of adherence to annual lung cancer LDCT screening. yes Impact of comorbidities on ability to participate in the program. yes Ability and willingness to under diagnostic treatment. yes  Smoking Cessation Counseling: Former Smokers:  Discussed the importance of maintaining cigarette abstinence. yes Diagnosis Code: Personal History of Nicotine Dependence. X91.478 Information about  tobacco cessation classes and interventions provided to patient. Yes Patient provided with "ticket" for LDCT Scan. N/a Written Order for Lung Cancer Screening with LDCT placed in Epic. Yes (CT Chest Lung Cancer Screening Low Dose W/O CM) [id number] Z12.2-Screening of respiratory organs Z87.891-Personal history of nicotine dependence  I spent 25 minutes of face to face time/virtual visit time  with the patient discussing the risks and benefits of lung cancer screening. We took the time to pause the power point at intervals to allow for questions to be asked and answered to ensure understanding. We discussed that he had taken the single most powerful action possible to decrease his risk of developing lung cancer when he quit smoking. I counseled him to remain smoke free, and to contact the office if he ever had the desire to smoke again so that I can provide resources and tools to help support the effort to remain smoke free. We discussed the time and location of the scan, and that either  Abigail Miyamoto RN, Karlton Lemon, RN or I  or I will call / send a letter with the results within  24-72 hours of receiving them. He has the office contact information in the event he has questions,  he verbalized understanding of all of the above and had no further questions upon leaving the office.     I explained to the patient that there has been a high incidence of coronary artery disease noted on these exams. I explained that this is a non-gated exam therefore degree or severity cannot be determined. This patient is on statin therapy. I have asked the patient to follow-up with their PCP regarding any incidental finding of coronary artery disease and management with diet or medication as they feel is clinically indicated.  The patient verbalized understanding of the above and had no further questions.      Darcella Gasman Vir Whetstine, PA-C

## 2022-06-18 NOTE — Patient Instructions (Signed)
Thank you for participating in the [place] [facility] Scre[facility]. It was our pleasure to meet you today. We will call you with the results of your scan within the next few days. Your scan will be assigned a Lung RADS category score by the physicians reading the scans.  This Lung RADS score determines follow up scanning.  See below for description of categories, and follow up screening recommendations. We will be in touch to schedule your follow up screening annually or based on recommendations of our providers. We will fax a copy of your scan results to your Primary Care Physician, or the physician who referred you to the program, to ensure they have the results. Please call the office if you have any questions or concerns regarding your scanning experience or results.  Our office number is [phone]. Please speak with Denise Phelps, RN. , or  Denise Buckner RN, They are  our Lung Cancer Screening RN.'s If They are unavailable when you call, Please leave a message on the voice mail. We will return your call at our earliest convenience.This voice mail is monitored several times a day.  Remember, if your scan is normal, we will scan you annually as long as you continue to meet the criteria for the program. (Age 50-80, Current smoker or smoker who has quit within the last 15 years). If you are a smoker, remember, quitting is the single most powerful action that you can take to decrease your risk of lung cancer and other pulmonary, breathing related problems. We know quitting is hard, and we are here to help.  Please let us know if there is anything we can do to help you meet your goal of quitting. If you are a former smoker, congratulations. We are proud of you! Remain smoke free! Remember you can refer friends or family members through the number above.  We will screen them to make sure they meet criteria for the program. Thank you for helping us take better care of you by  participating in Lung Screening.  You can receive free nicotine replacement therapy ( patches, gum or mints) by calling 1-800-QUIT NOW. Please call so we can get you on the path to becoming  a non-smoker. I know it is hard, but you can do this!  Lung RADS Categories:  Lung RADS 1: no nodules or definitely non-concerning nodules.  Recommendation is for a repeat annual scan in 12 months.  Lung RADS 2:  nodules that are non-concerning in appearance and behavior with a very low likelihood of becoming an active cancer. Recommendation is for a repeat annual scan in 12 months.  Lung RADS 3: nodules that are probably non-concerning , includes nodules with a low likelihood of becoming an active cancer.  Recommendation is for a 6-month repeat screening scan. Often noted after an upper respiratory illness. We will be in touch to make sure you have no questions, and to schedule your 6-month scan.  Lung RADS 4 A: nodules with concerning findings, recommendation is most often for a follow up scan in 3 months or additional testing based on our provider's assessment of the scan. We will be in touch to make sure you have no questions and to schedule the recommended 3 month follow up scan.  Lung RADS 4 B:  indicates findings that are concerning. We will be in touch with you to schedule additional diagnostic testing based on our provider's  assessment of the scan.  Other options for assistance in smoking cessation (   As covered by your insurance benefits)  Hypnosis for smoking cessation  [employer] [phone]  Acupuncture for smoking cessation  [facility] [phone]   

## 2022-06-22 ENCOUNTER — Other Ambulatory Visit: Payer: Self-pay

## 2022-06-22 DIAGNOSIS — Z87891 Personal history of nicotine dependence: Secondary | ICD-10-CM

## 2022-06-22 DIAGNOSIS — Z122 Encounter for screening for malignant neoplasm of respiratory organs: Secondary | ICD-10-CM

## 2022-06-24 ENCOUNTER — Encounter: Payer: Self-pay | Admitting: Nurse Practitioner

## 2022-06-25 MED ORDER — HYDROCODONE-ACETAMINOPHEN 5-325 MG PO TABS: 1.0000 | ORAL_TABLET | Freq: Four times a day (QID) | ORAL | 0 refills | Status: AC | PRN

## 2022-07-06 DIAGNOSIS — J439 Emphysema, unspecified: Secondary | ICD-10-CM | POA: Insufficient documentation

## 2022-07-06 DIAGNOSIS — I700 Atherosclerosis of aorta: Secondary | ICD-10-CM | POA: Insufficient documentation

## 2022-07-06 NOTE — Patient Instructions (Signed)
Cervical Radiculopathy  Cervical radiculopathy happens when a nerve in the neck (a cervical nerve) is pinched or bruised. This condition can happen because of an injury to the cervical spine (vertebrae) in the neck, or as part of the normal aging process. Pressure on the cervical nerves can cause pain or numbness that travels from the neck all the way down to the arm and fingers. This condition usually gets better with rest. Treatment may be needed if the condition does not improve. What are the causes? This condition may be caused by: A neck injury. A bulging (herniated) disk. Muscle spasms. Muscle tightness in the neck due to overuse. Arthritis. Breakdown or degeneration in the bones and joints of the spine (spondylosis) due to aging. Bone spurs that may develop near the cervical nerves. What are the signs or symptoms? Symptoms of this condition include: Pain. The pain may travel from the neck to the arm and hand. The pain can be severe or irritating. It may get worse when you move your neck. Numbness or tingling in your arm or hand. Weakness in the affected arm and hand, in severe cases. How is this diagnosed? This condition may be diagnosed based on your symptoms, your medical history, and a physical exam. You may also have tests, including: X-rays. CT scan. MRI. Electromyogram (EMG). Nerve conduction tests. How is this treated? In many cases, treatment is not needed for this condition. With rest, the condition usually gets better over time. If treatment is needed, options may include: Wearing a soft neck collar (cervical collar) for short periods of time. Doing physical therapy to strengthen your neck muscles. Taking medicines. These may include NSAIDs, such as ibuprofen, or oral corticosteroids. Having spinal injections, in severe cases. Having surgery. This may be needed if other treatments do not help. Different types of surgery may be done depending on the cause of this  condition. Follow these instructions at home: If you have a cervical collar: Wear it as told by your health care provider. Remove it only as told by your health care provider. Ask your health care provider if you can remove the cervical collar for cleaning and bathing. If you are allowed to remove the collar for cleaning or bathing: Follow instructions from your health care provider about how to remove the collar safely. Clean the collar by wiping it with mild soap and water and drying it completely. Take out any removable pads in the collar every 1-2 days, and wash them by hand with soap and water. Let them air-dry completely before you put them back in the collar. Check your skin under the collar for irritation or sores. If you see any, tell your health care provider. Managing pain     Take over-the-counter and prescription medicines only as told by your health care provider. If directed, put ice on the affected area. To do this: If you have a soft neck collar, remove it as told by your health care provider. Put ice in a plastic bag. Place a towel between your skin and the bag. Leave the ice on for 20 minutes, 2-3 times a day. Remove the ice if your skin turns bright red. This is very important. If you cannot feel pain, heat, or cold, you have a greater risk of damage to the area. If applying ice does not help, you can try using heat. Use the heat source that your health care provider recommends, such as a moist heat pack or a heating pad. Place a towel between   your skin and the heat source. Leave the heat on for 20-30 minutes. Remove the heat if your skin turns bright red. This is especially important if you are unable to feel pain, heat, or cold. You have a greater risk of getting burned. Try a gentle neck and shoulder massage to help relieve symptoms. Activity Rest as needed. Return to your normal activities as told by your health care provider. Ask your health care provider what  activities are safe for you. Do stretching and strengthening exercises as told by your health care provider or your physical therapist. You may have to avoid lifting. Ask your health care provider how much you can safely lift. General instructions Use a flat pillow when you sleep. Do not drive while wearing a cervical collar. If you do not have a cervical collar, ask your health care provider if it is safe to drive while your neck heals. Ask your health care provider if the medicine prescribed to you requires you to avoid driving or using machinery. Do not use any products that contain nicotine or tobacco. These products include cigarettes, chewing tobacco, and vaping devices, such as e-cigarettes. If you need help quitting, ask your health care provider. Keep all follow-up visits. This is important. Contact a health care provider if: Your condition does not improve with treatment. Get help right away if: Your pain gets much worse and is not controlled with medicines. You have weakness or numbness in your hand, arm, face, or leg. You have a high fever. You have a stiff, rigid neck. You lose control of your bowels or your bladder (have incontinence). You have trouble with walking, balance, or speaking. Summary Cervical radiculopathy happens when a nerve in the neck is pinched or bruised. A nerve can get pinched from a bulging disk, arthritis, muscle spasms, or an injury to the neck. Symptoms include pain, tingling, or numbness radiating from the neck to the arm or hand. Weakness can also occur in severe cases. Treatment may include rest, wearing a cervical collar, and physical therapy. Medicines may be prescribed to help with pain. In severe cases, injections or surgery may be needed. This information is not intended to replace advice given to you by your health care provider. Make sure you discuss any questions you have with your health care provider. Document Revised: 07/24/2020 Document  Reviewed: 07/24/2020 [employer] Patient Education  2024 [employer]  

## 2022-07-07 ENCOUNTER — Ambulatory Visit: Payer: No Typology Code available for payment source | Admitting: Nurse Practitioner

## 2022-07-07 ENCOUNTER — Encounter: Payer: Self-pay | Admitting: Nurse Practitioner

## 2022-07-07 VITALS — BP 109/73 | HR 65 | Temp 98.0°F | Ht 70.98 in | Wt 205.2 lb

## 2022-07-07 DIAGNOSIS — M5030 Other cervical disc degeneration, unspecified cervical region: Secondary | ICD-10-CM

## 2022-07-07 NOTE — Progress Notes (Signed)
BP 109/73   Pulse 65   Temp 98 F (36.7 C) (Oral)   Ht 5' 10.98" (1.803 m)   Wt 205 lb 3.2 oz (93.1 kg)   SpO2 97%   BMI 28.63 kg/m    Subjective:    Patient ID: Dwayne Jacobson, male    DOB: [birth year], 67 y.o.   MRN: [id number]  HPI: Dwayne Jacobson is a 67 y.o. male  Chief Complaint  Patient presents with   Neck Pain    Patient states that the pain is about the same as it was last time he was here.   NECK PAIN  Follow-up for neck pain, initially seen 05/26/22 -- was started on Prednisone taper and Norco.  Returned for follow-up on 06/11/22 with no improvement and PT was ordered -- starts this on June 12th.  Reports no current improvement in neck pain.  Norco offers some benefit - is taking twice a day.  Imaging noting moderate multilevel degenerative disc disease in the mid to lower cervical spine, prominent at C6-C7.  Neck pain for >4 weeks now, no recent injuries or falls.  Denies any recent heavy lifting.  Pain started during his normal routine.  Denies CP, SOB, or diaphoresis.  Pain is present more with extension of neck, then radiates down arm + with lateral right.  Treatments attempted: Salon Pas patch which helps slightly, rheumatologist gave muscle relaxer - no benefit.  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  Norco has offered some benefit. Relief with NSAIDs?:  No NSAIDs Taken Location:Left, L>R, and midline Duration:weeks Severity: 6/10 -- worse in the afternoon, after working all day Quality: dull and aching with burning Frequency: constant Radiation: L arm down to fingers Aggravating factors: movement, laying, and bending Alleviating factors: as above Weakness:  no Paresthesias / decreased sensation:  yes -- into left fingers Fevers:  no   Relevant past medical, surgical, family and social history reviewed and updated as indicated. Interim medical history since our last visit reviewed. Allergies and medications reviewed and updated.  Review of Systems   Constitutional:  Negative for activity change, diaphoresis, fatigue and fever.  Respiratory:  Negative for cough, chest tightness, shortness of breath and wheezing.   Cardiovascular:  Negative for chest pain, palpitations and leg swelling.  Gastrointestinal: Negative.   Musculoskeletal:  Positive for neck pain.  Neurological: Negative.   Psychiatric/Behavioral: Negative.      Per HPI unless specifically indicated above     Objective:    BP 109/73   Pulse 65   Temp 98 F (36.7 C) (Oral)   Ht 5' 10.98" (1.803 m)   Wt 205 lb 3.2 oz (93.1 kg)   SpO2 97%   BMI 28.63 kg/m   Wt Readings from Last 3 Encounters:  07/07/22 205 lb 3.2 oz (93.1 kg)  06/11/22 203 lb (92.1 kg)  05/26/22 206 lb 12.8 oz (93.8 kg)    Physical Exam Vitals and nursing note reviewed.  Constitutional:      General: He is awake. He is not in acute distress.    Appearance: He is well-developed and well-groomed. He is not ill-appearing.  HENT:     Head: Normocephalic and atraumatic.     Right Ear: Hearing, ear canal and external ear normal. No drainage.     Left Ear: Hearing, ear canal and external ear normal. No drainage.     Nose: Nose normal.     Mouth/Throat:     Pharynx: Uvula midline.  Eyes:  General: Lids are normal.        Right eye: No discharge.        Left eye: No discharge.     Extraocular Movements: Extraocular movements intact.     Conjunctiva/sclera: Conjunctivae normal.     Pupils: Pupils are equal, round, and reactive to light.     Visual Fields: Right eye visual fields normal and left eye visual fields normal.  Neck:     Thyroid: No thyromegaly.     Vascular: No carotid bruit or JVD.     Trachea: Trachea normal.  Cardiovascular:     Rate and Rhythm: Normal rate and regular rhythm.     Heart sounds: Normal heart sounds, S1 normal and S2 normal. No murmur heard.    No gallop.  Pulmonary:     Effort: Pulmonary effort is normal. No accessory muscle usage or respiratory distress.      Breath sounds: Normal breath sounds.  Abdominal:     General: Bowel sounds are normal.     Palpations: Abdomen is soft. There is no hepatomegaly or splenomegaly.     Tenderness: There is no abdominal tenderness.  Musculoskeletal:     Cervical back: Neck supple. No edema, erythema or torticollis. Pain with movement (with extension pain and lateral right, improved pain with flexion) and muscular tenderness present. No spinous process tenderness. Decreased range of motion.     Right lower leg: No edema.     Left lower leg: No edema.  Lymphadenopathy:     Head:     Right side of head: No submental, submandibular, tonsillar, preauricular or posterior auricular adenopathy.     Left side of head: No submental, submandibular, tonsillar, preauricular or posterior auricular adenopathy.     Cervical: No cervical adenopathy.  Skin:    General: Skin is warm and dry.     Capillary Refill: Capillary refill takes less than 2 seconds.     Findings: No rash.  Neurological:     Mental Status: He is alert and oriented to person, place, and time.     Cranial Nerves: Cranial nerves 2-12 are intact.     Sensory: Sensation is intact.     Gait: Gait is intact.     Deep Tendon Reflexes: Reflexes are normal and symmetric.     Reflex Scores:      Brachioradialis reflexes are 2+ on the right side and 2+ on the left side.      Patellar reflexes are 2+ on the right side and 2+ on the left side. Psychiatric:        Attention and Perception: Attention normal.        Mood and Affect: Mood normal.        Speech: Speech normal.        Behavior: Behavior normal. Behavior is cooperative.        Thought Content: Thought content normal.        Cognition and Memory: Cognition normal.        Judgment: Judgment normal.    Results for orders placed or performed in visit on 05/07/22  Protein / creatinine ratio, urine  Result Value Ref Range   Creatinine, Urine 52 20 - 320 mg/dL   Protein/Creat Ratio NOTE 25 - 148 mg/g  creat   Protein/Creatinine Ratio NOTE 0.025 - 0.148 mg/mg creat   Total Protein, Urine <4 (L) 5 - 25 mg/dL  CBC with Differential/Platelet  Result Value Ref Range   WBC 5.8 3.8 - 10.8 Thousand/uL  RBC 4.77 4.20 - 5.80 Million/uL   Hemoglobin 14.8 13.2 - 17.1 g/dL   HCT 16.1 09.6 - 04.5 %   MCV 91.6 80.0 - 100.0 fL   MCH 31.0 27.0 - 33.0 pg   MCHC 33.9 32.0 - 36.0 g/dL   RDW 40.9 81.1 - 91.4 %   Platelets 278 140 - 400 Thousand/uL   MPV 9.7 7.5 - 12.5 fL   Neutro Abs 3,799 1,500 - 7,800 cells/uL   Lymphs Abs 1,085 850 - 3,900 cells/uL   Absolute Monocytes 684 200 - 950 cells/uL   Eosinophils Absolute 180 15 - 500 cells/uL   Basophils Absolute 52 0 - 200 cells/uL   Neutrophils Relative % 65.5 %   Total Lymphocyte 18.7 %   Monocytes Relative 11.8 %   Eosinophils Relative 3.1 %   Basophils Relative 0.9 %  COMPLETE METABOLIC PANEL WITH GFR  Result Value Ref Range   Glucose, Bld 97 65 - 99 mg/dL   BUN 10 7 - 25 mg/dL   Creat 7.82 9.56 - 2.13 mg/dL   eGFR 95 > OR = 60 YQ/MVH/8.46N6   BUN/Creatinine Ratio SEE NOTE: 6 - 22 (calc)   Sodium 136 135 - 146 mmol/L   Potassium 4.8 3.5 - 5.3 mmol/L   Chloride 98 98 - 110 mmol/L   CO2 28 20 - 32 mmol/L   Calcium 10.0 8.6 - 10.3 mg/dL   Total Protein 7.8 6.1 - 8.1 g/dL   Albumin 4.8 3.6 - 5.1 g/dL   Globulin 3.0 1.9 - 3.7 g/dL (calc)   AG Ratio 1.6 1.0 - 2.5 (calc)   Total Bilirubin 0.4 0.2 - 1.2 mg/dL   Alkaline phosphatase (APISO) 79 35 - 144 U/L   AST 25 10 - 35 U/L   ALT 25 9 - 46 U/L  Anti-DNA antibody, double-stranded  Result Value Ref Range   ds DNA Ab 4 IU/mL  C3 and C4  Result Value Ref Range   C3 Complement 136 82 - 185 mg/dL   C4 Complement 27 15 - 53 mg/dL  Sedimentation rate  Result Value Ref Range   Sed Rate 11 0 - 20 mm/h      Assessment & Plan:   Problem List Items Addressed This Visit       Musculoskeletal and Integument   Degeneration of intervertebral disc of cervical spine without disc herniation -  Primary    For >4 weeks with radiculopathy, to left fingers. No red flags. Imaging noted degenerative changes.  Pain worsens with extension and lateral right, improves with flexion.  Suspect nerve impingement/arthritic changes.  Will send in refills on Norco for short bursts as needed, he is minimally using.  Recommend Tylenol as needed, up to 3000 MG max daily (this includes adding in the 325 MG in Norco).  Discussed gentle exercise to help with discomfort.  Continue Salon Pas patches.  Scheduled to start PT on the 12th.  If no benefit and ongoing pain, then will consider obtaining MRI and sending to neurosurgery or physiatry.        Follow up plan: Return in about 5 weeks (around 08/11/2022) for NECK PAIN.

## 2022-07-07 NOTE — Assessment & Plan Note (Signed)
For >4 weeks with radiculopathy, to left fingers. No red flags. Imaging noted degenerative changes.  Pain worsens with extension and lateral right, improves with flexion.  Suspect nerve impingement/arthritic changes.  Will send in refills on Norco for short bursts as needed, he is minimally using.  Recommend Tylenol as needed, up to 3000 MG max daily (this includes adding in the 325 MG in Norco).  Discussed gentle exercise to help with discomfort.  Continue Salon Pas patches.  Scheduled to start PT on the 12th.  If no benefit and ongoing pain, then will consider obtaining MRI and sending to neurosurgery or physiatry.

## 2022-07-14 ENCOUNTER — Ambulatory Visit: Payer: No Typology Code available for payment source | Attending: Nurse Practitioner

## 2022-07-14 DIAGNOSIS — R293 Abnormal posture: Secondary | ICD-10-CM | POA: Diagnosis present

## 2022-07-14 DIAGNOSIS — M542 Cervicalgia: Secondary | ICD-10-CM | POA: Insufficient documentation

## 2022-07-14 DIAGNOSIS — M5030 Other cervical disc degeneration, unspecified cervical region: Secondary | ICD-10-CM | POA: Insufficient documentation

## 2022-07-14 NOTE — Therapy (Signed)
OUTPATIENT PHYSICAL THERAPY CERVICAL EVALUATION   Patient Name: Dwayne Jacobson MRN: [id number] DOB:[birth year], 67 y.o., male Today's Date: 07/15/2022  END OF SESSION:  PT End of Session - 07/14/22 1412     Visit Number 1    Number of Visits 17    Date for PT Re-Evaluation 09/08/22    PT Start Time 1420    PT Stop Time 1506    PT Time Calculation (min) 46 min    Activity Tolerance Patient tolerated treatment well    Behavior During Therapy WFL for tasks assessed/performed             Past Medical History:  Diagnosis Date   DVT (deep venous thrombosis) (HCC)    Hyperlipidemia    Hypertension    Seizures (HCC)    Skin cancer    Systemic lupus erythematosus (HCC)    Past Surgical History:  Procedure Laterality Date   NO PAST SURGERIES     Patient Active Problem List   Diagnosis Date Noted   Emphysema lung (HCC) 07/06/2022   Aortic atherosclerosis (HCC) 07/06/2022   Degeneration of intervertebral disc of cervical spine without disc herniation 05/30/2022   Erectile dysfunction 03/26/2022   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   Current every day nicotine vaping 10/31/2020   Healthcare maintenance 09/19/2020   Gastroesophageal reflux disease without esophagitis 08/17/2019   Chondrocalcinosis 01/13/2017   Primary osteoarthritis of both knees 01/13/2017   History of DVT (deep vein thrombosis)    Hypertension    Hyperlipidemia    SLE (systemic lupus erythematosus) (HCC) 01/18/2014    PCP: Marjie Skiff, NPT  REFERRING PROVIDER: Aura Dials T, NPT  REFERRING DIAG: M50.30 (ICD-10-CM) - Degeneration of intervertebral disc of cervical spine without disc herniation  THERAPY DIAG:  Cervicalgia  Abnormal posture  Rationale for Evaluation and Treatment: Rehabilitation  ONSET DATE: Early April 2024  SUBJECTIVE:                                                                                                                                                                                                          SUBJECTIVE STATEMENT: Pt is a pleasant 67 y.o. male referred to OPPT for cervical radiculopathy.  Hand dominance: Left  PERTINENT HISTORY:  Pt is a pleasant 67 y.o. male referred to OPPT for cervical radiculopathy. Pt reports gradual onset of L sided neck pain radiating down L arm leading to N/T into his hand and fingers. Began with difficulty sleeping and just neck pain. Pain described as dull/achey in the neck down the L arm  on posterior aspect with dorsal and palmar side of hands/fingers with N/T. Reports N/T as mild to moderate. Not leading to clumsiness and dropping items. Worst pain 6/10 NPS, Best pain on pain meds is 3/10 NPS. Current pain 6/10 NPS. Aggravating factors include: sleeping supine, cervical extension. Relieving factor include: UE elevation over head, cervical flexion, and pain meds. Pt works Public librarian retirement. Reports impact at work tasks such as standing work tasks. Denies B/B symptoms, s/s of fever, saddle anesthesia, coordination or balance impairment.   From referring provider:  for ongoing neck pain for 5 weeks, imaging on x-ray noting imaging noting moderate multilevel degenerative disc disease in the mid to lower cervical spine, prominent at C6-C7. Having some radiculopathy left arm.  PAIN:  Are you having pain? Yes: NPRS scale: 6/10 Pain location: L cervical spine, paraspinals, shoulder and posterior UE Pain description: aching/dull Aggravating factors: cervical extension, sleeping supine Relieving factors: LUE overhead, cervical flexion  PRECAUTIONS: None  WEIGHT BEARING RESTRICTIONS: No  FALLS:  Has patient fallen in last 6 months? No  LIVING ENVIRONMENT: Lives with: lives with their spouse  OCCUPATION: Holiday representative  PLOF: Independent  PATIENT GOALS: Improve pain  NEXT MD VISIT: July 2024  OBJECTIVE:   DIAGNOSTIC FINDINGS:  CLINICAL DATA:  Posterior left neck pain for 3 weeks  radiating to the left shoulder and upper extremity with left hand numbness intermittently. No reported injury.   EXAM: CERVICAL SPINE - COMPLETE 4+ VIEW   COMPARISON:  None Available.   FINDINGS: On the lateral view the cervical spine is visualized to the level of C6-7 with improved visualization of C7-T1 on the swimmer's view. Straightening of the cervical spine. Pre-vertebral soft tissues are within normal limits. No fracture is detected in the cervical spine. Dens is well positioned between the lateral masses of C1. Moderate multilevel degenerative disc disease in the mid to lower cervical spine, most prominent at C6-7. No cervical spine subluxation. Mild bilateral cervical facet arthropathy. Suggestion of mild degenerative foraminal stenosis on the left at C6-7. No aggressive-appearing focal osseous lesions.   IMPRESSION: 1. Moderate multilevel degenerative disc disease in the mid to lower cervical spine, most prominent at C6-7. 2. Mild bilateral cervical facet arthropathy. 3. Suggestion of mild degenerative foraminal stenosis on the left at C6-7.     Electronically Signed   By: Delbert Phenix M.D.   On: 05/30/2022 12:19  PATIENT SURVEYS:  FOTO deferred to next session   Quick DASH: COGNITION: Overall cognitive status: Within functional limits for tasks assessed  SENSATION: WFL  POSTURE: rounded shoulders, forward head, and increased thoracic kyphosis  PALPATION: TTP along C2-T2 along spinous processes, L cervical paraspinals, upper trap.   CERVICAL ROM:   Active ROM A/PROM (deg) eval  Flexion 55  Extension 25*  Right lateral flexion 37  Left lateral flexion 37  Right rotation 55  Left rotation 50   (Blank rows = not tested)  UPPER EXTREMITY ROM:  Active ROM Right eval Left eval  Shoulder flexion WNL WNL  Shoulder extension    Shoulder abduction WNL WNL  Shoulder adduction    Shoulder extension    Shoulder internal rotation    Shoulder external  rotation    Elbow flexion    Elbow extension    Wrist flexion    Wrist extension    Wrist ulnar deviation    Wrist radial deviation    Wrist pronation    Wrist supination     (Blank rows = not tested)  UPPER  EXTREMITY MMT:  MMT Right eval Left eval  Shoulder flexion 5 5  Shoulder extension    Shoulder abduction 5 5  Shoulder adduction    Shoulder extension    Shoulder internal rotation 5 5  Shoulder external rotation 5 5  Middle trapezius    Lower trapezius    Elbow flexion 5 5  Elbow extension 5 5  Wrist flexion 5 4  Wrist extension    Wrist ulnar deviation    Wrist radial deviation    Wrist pronation    Wrist supination    Grip strength Symmetrical  Symmetrical   (Blank rows = not tested)  CERVICAL SPECIAL TESTS:  Upper limb tension test (ULTT): Positive, Spurling's test: Negative, and Distraction test: Negative  Radial specific ULTT. Ulnar and median nerve negative  JOINT MOBILITY:   Cervical CPA's PA hypomobile and concordant symptoms C2-T2. Concordant N/T in L hand/fingers with approximate C6-C7 CPA's.   TODAY'S TREATMENT:                                                                                                                              DATE: 07/14/22   Provided HEP  PATIENT EDUCATION:  Education details: POC, prognosis, HEP Person educated: Patient Education method: Explanation, Demonstration, and Handouts Education comprehension: verbalized understanding, returned demonstration, and needs further education  HOME EXERCISE PROGRAM: Access Code: [email or web address] URL: [email or web address] Date: 07/15/2022 Prepared by: Ronnie Derby  Exercises - Seated Cervical Retraction  - 1 x daily - 7 x weekly - 2 sets - 15 reps - Seated Upper Trapezius Stretch  - 1 x daily - 7 x weekly - 2 sets - 15 reps - Seated Cervical Rotation AROM  - 1 x daily - 7 x weekly - 2 sets - 15 reps  ASSESSMENT:  CLINICAL IMPRESSION: Patient is a 67 y.o. male who  was seen today for physical therapy evaluation and treatment for  L sided cervical pain and L sided cervical radiculopathy. Pt presents with no current red flag signs/symptoms. CPR for cervical radiculopathy grossly negative however pt presents with signs/symptoms consistent with C7 radiculopathy with LUE posterior radiating pain and N/T that's reproduced with C6-T1 joint springing and ULTT with radial bias and wrist extension weakness.  Pt with significant cervical AROM limitations and general hypomobility and abnormal, forward head, kyphotic posture. These limitations are limiting pt's activity and participation with work related tasks (I.e. standing for periods of time), sleep, household ADL's that require cervical extension/rotational components due to worsening N/T and moderate pain. Pt will benefit from skilled PT services to address these impairments and return to PLOF and improve overall QoL.  OBJECTIVE IMPAIRMENTS: decreased ROM, decreased strength, hypomobility, impaired flexibility, impaired sensation, impaired UE functional use, improper body mechanics, postural dysfunction, and pain.   ACTIVITY LIMITATIONS: lifting, standing, and sleeping  PARTICIPATION LIMITATIONS: community activity and occupation  PERSONAL FACTORS: Age, Past/current experiences, Profession, Time since onset of injury/illness/exacerbation, and 3+ comorbidities: HLD, HTN, systemic  lupus erythematosus, hx of DVT, hx of knee and cervical OA, aortic atherosclerosis  are also affecting patient's functional outcome.   REHAB POTENTIAL: Good  CLINICAL DECISION MAKING: Evolving/moderate complexity  EVALUATION COMPLEXITY: Moderate   GOALS: Goals reviewed with patient? No  SHORT TERM GOALS: Target date: 08/11/22  Pt will be independent with HEP to improve cervical AROM and radicular pain to return to PLOF. Baseline: 07/14/22: given initial HEP Goal status: INITIAL  LONG TERM GOALS: Target date: 09/08/22  Pt will improve  FOTO to target score to demonstrate clinically significant improvement in functional mobility.  Baseline: 07/14/22: deferred to next session Goal status: INITIAL  2.  Pt will improve cervical extension and rotation AROM by at least 7 degrees in these planes for clinically significant improvement in mobility for overhead and rotational ADL's. Baseline: 07/14/22:   Active ROM A/PROM (deg) eval  Flexion 55  Extension 25*  Right lateral flexion 37  Left lateral flexion 37  Right rotation 55  Left rotation 50   Goal status: INITIAL  3.  Pt will report ability to stand at work for > 1.5 hours with no more than 4/10 pain in NPS to demonstrate clinically significant improvement in pain with standing work tasks.  Baseline: 07/14/22: Onset of 6/10 NPS cervical and LUE pain with standing for 1.5 hours or greater.  Goal status: INITIAL  PLAN:  PT FREQUENCY: 1-2x/week  PT DURATION: 8 weeks  PLANNED INTERVENTIONS: Therapeutic exercises, Therapeutic activity, Neuromuscular re-education, Balance training, Gait training, Patient/Family education, Self Care, Joint mobilization, Joint manipulation, Dry Needling, Electrical stimulation, Spinal mobilization, Cryotherapy, Moist heat, Manual therapy, and Re-evaluation  PLAN FOR NEXT SESSION: FOTO, HEP, cervical AROM, thoracic extension/periscap strengthening   Delphia Grates. Fairly IV, PT, DPT Physical Therapist- [place]  [facility]  07/15/2022, 9:02 AM

## 2022-07-16 ENCOUNTER — Ambulatory Visit: Payer: No Typology Code available for payment source

## 2022-07-16 DIAGNOSIS — M542 Cervicalgia: Secondary | ICD-10-CM | POA: Diagnosis not present

## 2022-07-16 DIAGNOSIS — R293 Abnormal posture: Secondary | ICD-10-CM

## 2022-07-16 NOTE — Therapy (Signed)
OUTPATIENT PHYSICAL THERAPY CERVICAL TREATMENT   Patient Name: Dwayne Jacobson MRN: [id number] DOB:[birth year], 67 y.o., male Today's Date: 07/16/2022  END OF SESSION:  PT End of Session - 07/16/22 1024     Visit Number 2    Number of Visits 17    Date for PT Re-Evaluation 09/08/22    PT Start Time 1030    PT Stop Time 1113    PT Time Calculation (min) 43 min    Activity Tolerance Patient tolerated treatment well    Behavior During Therapy WFL for tasks assessed/performed             Past Medical History:  Diagnosis Date   DVT (deep venous thrombosis) (HCC)    Hyperlipidemia    Hypertension    Seizures (HCC)    Skin cancer    Systemic lupus erythematosus (HCC)    Past Surgical History:  Procedure Laterality Date   NO PAST SURGERIES     Patient Active Problem List   Diagnosis Date Noted   Emphysema lung (HCC) 07/06/2022   Aortic atherosclerosis (HCC) 07/06/2022   Degeneration of intervertebral disc of cervical spine without disc herniation 05/30/2022   Erectile dysfunction 03/26/2022   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   Current every day nicotine vaping 10/31/2020   Healthcare maintenance 09/19/2020   Gastroesophageal reflux disease without esophagitis 08/17/2019   Chondrocalcinosis 01/13/2017   Primary osteoarthritis of both knees 01/13/2017   History of DVT (deep vein thrombosis)    Hypertension    Hyperlipidemia    SLE (systemic lupus erythematosus) (HCC) 01/18/2014    PCP: Marjie Skiff, NPT  REFERRING PROVIDER: Aura Dials T, NPT  REFERRING DIAG: M50.30 (ICD-10-CM) - Degeneration of intervertebral disc of cervical spine without disc herniation  THERAPY DIAG:  Cervicalgia  Abnormal posture  Rationale for Evaluation and Treatment: Rehabilitation  ONSET DATE: Early April 2024  SUBJECTIVE:                                                                                                                                                                                                          SUBJECTIVE STATEMENT: Pt reports compliance with HEP. 4/10 NPS in neck this morning. Generally no change in symptoms since eval.  Hand dominance: Left  PERTINENT HISTORY:  Pt is a pleasant 67 y.o. male referred to OPPT for cervical radiculopathy. Pt reports gradual onset of L sided neck pain radiating down L arm leading to N/T into his hand and fingers. Began with difficulty sleeping and just neck pain. Pain described as dull/achey in the  neck down the L arm on posterior aspect with dorsal and palmar side of hands/fingers with N/T. Reports N/T as mild to moderate. Not leading to clumsiness and dropping items. Worst pain 6/10 NPS, Best pain on pain meds is 3/10 NPS. Current pain 6/10 NPS. Aggravating factors include: sleeping supine, cervical extension. Relieving factor include: UE elevation over head, cervical flexion, and pain meds. Pt works [employer] retirement. Reports impact at work tasks such as standing work tasks. Denies B/B symptoms, s/s of fever, saddle anesthesia, coordination or balance impairment.   From referring provider:  for ongoing neck pain for 5 weeks, imaging on x-ray noting imaging noting moderate multilevel degenerative disc disease in the mid to lower cervical spine, prominent at C6-C7. Having some radiculopathy left arm.  PAIN:  Are you having pain? Yes: NPRS scale: 4/10 Pain location: L cervical spine, paraspinals, shoulder and posterior UE Pain description: aching/dull Aggravating factors: cervical extension, sleeping supine Relieving factors: LUE overhead, cervical flexion  PRECAUTIONS: None  WEIGHT BEARING RESTRICTIONS: No  FALLS:  Has patient fallen in last 6 months? No  LIVING ENVIRONMENT: Lives with: lives with their spouse  OCCUPATION: Holiday representative  PLOF: Independent  PATIENT GOALS: Improve pain  NEXT MD VISIT: July 2024  OBJECTIVE:   DIAGNOSTIC FINDINGS:  CLINICAL DATA:  Posterior left  neck pain for 3 weeks radiating to the left shoulder and upper extremity with left hand numbness intermittently. No reported injury.   EXAM: CERVICAL SPINE - COMPLETE 4+ VIEW   COMPARISON:  None Available.   FINDINGS: On the lateral view the cervical spine is visualized to the level of C6-7 with improved visualization of C7-T1 on the swimmer's view. Straightening of the cervical spine. Pre-vertebral soft tissues are within normal limits. No fracture is detected in the cervical spine. Dens is well positioned between the lateral masses of C1. Moderate multilevel degenerative disc disease in the mid to lower cervical spine, most prominent at C6-7. No cervical spine subluxation. Mild bilateral cervical facet arthropathy. Suggestion of mild degenerative foraminal stenosis on the left at C6-7. No aggressive-appearing focal osseous lesions.   IMPRESSION: 1. Moderate multilevel degenerative disc disease in the mid to lower cervical spine, most prominent at C6-7. 2. Mild bilateral cervical facet arthropathy. 3. Suggestion of mild degenerative foraminal stenosis on the left at C6-7.     Electronically Signed   By: Delbert Phenix M.D.   On: 05/30/2022 12:19  PATIENT SURVEYS:  FOTO deferred to next session   Quick DASH: COGNITION: Overall cognitive status: Within functional limits for tasks assessed  SENSATION: WFL  POSTURE: rounded shoulders, forward head, and increased thoracic kyphosis  PALPATION: TTP along C2-T2 along spinous processes, L cervical paraspinals, upper trap.   CERVICAL ROM:   Active ROM A/PROM (deg) eval  Flexion 55  Extension 25*  Right lateral flexion 37  Left lateral flexion 37  Right rotation 55  Left rotation 50   (Blank rows = not tested)  UPPER EXTREMITY ROM:  Active ROM Right eval Left eval  Shoulder flexion WNL WNL  Shoulder extension    Shoulder abduction WNL WNL  Shoulder adduction    Shoulder extension    Shoulder internal rotation     Shoulder external rotation    Elbow flexion    Elbow extension    Wrist flexion    Wrist extension    Wrist ulnar deviation    Wrist radial deviation    Wrist pronation    Wrist supination     (Blank rows =  not tested)  UPPER EXTREMITY MMT:  MMT Right eval Left eval  Shoulder flexion 5 5  Shoulder extension    Shoulder abduction 5 5  Shoulder adduction    Shoulder extension    Shoulder internal rotation 5 5  Shoulder external rotation 5 5  Middle trapezius    Lower trapezius    Elbow flexion 5 5  Elbow extension 5 5  Wrist flexion 5 4  Wrist extension    Wrist ulnar deviation    Wrist radial deviation    Wrist pronation    Wrist supination    Grip strength Symmetrical  Symmetrical   (Blank rows = not tested)  CERVICAL SPECIAL TESTS:  Upper limb tension test (ULTT): Positive, Spurling's test: Negative, and Distraction test: Negative  Radial specific ULTT. Ulnar and median nerve negative  JOINT MOBILITY:   Cervical CPA's PA hypomobile and concordant symptoms C2-T2. Concordant N/T in L hand/fingers with approximate C6-C7 CPA's.   TODAY'S TREATMENT:                                                                                                                              DATE: 07/16/22   There.ex:   Nu-Step L2 for 5 min for gentle thoracic mobility   FOTO: 56 with target of 67  Reviewed HEP as listed below  Exercises - Seated Cervical Retraction  - 1 x daily - 7 x weekly - 2 sets - 15 reps - Seated Upper Trapezius Stretch  - 1 x daily - 7 x weekly - 2 sets - 15 reps - Seated Cervical Rotation AROM  - 1 x daily - 7 x weekly - 2 sets - 15 reps   Seated/standing LUE radial nerve glides: x10. Initial max PT TC's. Excellent carryover after first 5 reps without PT cues.    Standing, resisted scap retractions: 2x12, 20#    PATIENT EDUCATION:  Education details: POC, prognosis, HEP Person educated: Patient Education method: Explanation, Demonstration, and  Handouts Education comprehension: verbalized understanding, returned demonstration, and needs further education  HOME EXERCISE PROGRAM: Access Code: [email or web address] URL: [email or web address] Date: 07/15/2022 Prepared by: Ronnie Derby  Exercises - Seated Cervical Retraction  - 1 x daily - 7 x weekly - 2 sets - 15 reps - Seated Upper Trapezius Stretch  - 1 x daily - 7 x weekly - 2 sets - 15 reps - Seated Cervical Rotation AROM  - 1 x daily - 7 x weekly - 2 sets - 15 reps  ASSESSMENT:  CLINICAL IMPRESSION: Pt arriving for PT f/u since eval. Pt with good understanding of HEP with good compliance. Educated on radial nerve bias flossing technique to address radial nerve distribution N/T. Educated on importance of improving thoracic mobility and cervical neck flexion to reduce forward head, thoracic kyphosis, and lower cervical lordosis to improve lower c-spine radiculopathy symptoms. Pain unchanged post session. Pt will benefit from skilled PT services to address these impairments and return to PLOF  and improve overall QoL.   OBJECTIVE IMPAIRMENTS: decreased ROM, decreased strength, hypomobility, impaired flexibility, impaired sensation, impaired UE functional use, improper body mechanics, postural dysfunction, and pain.   ACTIVITY LIMITATIONS: lifting, standing, and sleeping  PARTICIPATION LIMITATIONS: community activity and occupation  PERSONAL FACTORS: Age, Past/current experiences, Profession, Time since onset of injury/illness/exacerbation, and 3+ comorbidities: HLD, HTN, systemic lupus erythematosus, hx of DVT, hx of knee and cervical OA, aortic atherosclerosis  are also affecting patient's functional outcome.   REHAB POTENTIAL: Good  CLINICAL DECISION MAKING: Evolving/moderate complexity  EVALUATION COMPLEXITY: Moderate   GOALS: Goals reviewed with patient? No  SHORT TERM GOALS: Target date: 08/11/22  Pt will be independent with HEP to improve cervical AROM and  radicular pain to return to PLOF. Baseline: 07/14/22: given initial HEP Goal status: INITIAL  LONG TERM GOALS: Target date: 09/08/22  Pt will improve FOTO to target score to demonstrate clinically significant improvement in functional mobility.  Baseline: 07/14/22: deferred to next session; 07/16/22: 56 with target score of 67 Goal status: INITIAL  2.  Pt will improve cervical extension and rotation AROM by at least 7 degrees in these planes for clinically significant improvement in mobility for overhead and rotational ADL's. Baseline: 07/14/22:   Active ROM A/PROM (deg) eval  Flexion 55  Extension 25*  Right lateral flexion 37  Left lateral flexion 37  Right rotation 55  Left rotation 50   Goal status: INITIAL  3.  Pt will report ability to stand at work for > 1.5 hours with no more than 4/10 pain in NPS to demonstrate clinically significant improvement in pain with standing work tasks.  Baseline: 07/14/22: Onset of 6/10 NPS cervical and LUE pain with standing for 1.5 hours or greater.  Goal status: INITIAL  PLAN:  PT FREQUENCY: 1-2x/week  PT DURATION: 8 weeks  PLANNED INTERVENTIONS: Therapeutic exercises, Therapeutic activity, Neuromuscular re-education, Balance training, Gait training, Patient/Family education, Self Care, Joint mobilization, Joint manipulation, Dry Needling, Electrical stimulation, Spinal mobilization, Cryotherapy, Moist heat, Manual therapy, and Re-evaluation  PLAN FOR NEXT SESSION: cervical AROM, thoracic extension/periscap strengthening   Delphia Grates. Fairly IV, PT, DPT Physical Therapist- [facility]  07/16/2022, 12:08 PM

## 2022-07-19 ENCOUNTER — Ambulatory Visit: Payer: No Typology Code available for payment source

## 2022-07-19 DIAGNOSIS — M542 Cervicalgia: Secondary | ICD-10-CM

## 2022-07-19 DIAGNOSIS — R293 Abnormal posture: Secondary | ICD-10-CM

## 2022-07-19 NOTE — Therapy (Signed)
OUTPATIENT PHYSICAL THERAPY CERVICAL TREATMENT   Patient Name: Dwayne Jacobson MRN: [id number] DOB:[birth year], 67 y.o., male Today's Date: 07/19/2022  END OF SESSION:  PT End of Session - 07/19/22 1416     Visit Number 3    Number of Visits 17    Date for PT Re-Evaluation 09/08/22    PT Start Time 1420    PT Stop Time 1505    PT Time Calculation (min) 45 min    Activity Tolerance Patient tolerated treatment well    Behavior During Therapy WFL for tasks assessed/performed             Past Medical History:  Diagnosis Date   DVT (deep venous thrombosis) (HCC)    Hyperlipidemia    Hypertension    Seizures (HCC)    Skin cancer    Systemic lupus erythematosus (HCC)    Past Surgical History:  Procedure Laterality Date   NO PAST SURGERIES     Patient Active Problem List   Diagnosis Date Noted   Emphysema lung (HCC) 07/06/2022   Aortic atherosclerosis (HCC) 07/06/2022   Degeneration of intervertebral disc of cervical spine without disc herniation 05/30/2022   Erectile dysfunction 03/26/2022   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   Current every day nicotine vaping 10/31/2020   Healthcare maintenance 09/19/2020   Gastroesophageal reflux disease without esophagitis 08/17/2019   Chondrocalcinosis 01/13/2017   Primary osteoarthritis of both knees 01/13/2017   History of DVT (deep vein thrombosis)    Hypertension    Hyperlipidemia    SLE (systemic lupus erythematosus) (HCC) 01/18/2014    PCP: Marjie Skiff, NPT  REFERRING PROVIDER: Aura Dials T, NPT  REFERRING DIAG: M50.30 (ICD-10-CM) - Degeneration of intervertebral disc of cervical spine without disc herniation  THERAPY DIAG:  Cervicalgia  Abnormal posture  Rationale for Evaluation and Treatment: Rehabilitation  ONSET DATE: Early April 2024  SUBJECTIVE:                                                                                                                                                                                                          SUBJECTIVE STATEMENT: Pt reports pain improved 2/10 NPS. Reports exercises have been very beneficial for neck pain, less severe LUE N/T and also improved sleep quality. Pt also reporting reduced pain levels with prolonged standing work tasks.    Hand dominance: Left  PERTINENT HISTORY:  Pt is a pleasant 67 y.o. male referred to OPPT for cervical radiculopathy. Pt reports gradual onset of L sided neck pain radiating down L arm leading to N/T  into his hand and fingers. Began with difficulty sleeping and just neck pain. Pain described as dull/achey in the neck down the L arm on posterior aspect with dorsal and palmar side of hands/fingers with N/T. Reports N/T as mild to moderate. Not leading to clumsiness and dropping items. Worst pain 6/10 NPS, Best pain on pain meds is 3/10 NPS. Current pain 6/10 NPS. Aggravating factors include: sleeping supine, cervical extension. Relieving factor include: UE elevation over head, cervical flexion, and pain meds. Pt works [employer] retirement. Reports impact at work tasks such as standing work tasks. Denies B/B symptoms, s/s of fever, saddle anesthesia, coordination or balance impairment.   From referring provider:  for ongoing neck pain for 5 weeks, imaging on x-ray noting imaging noting moderate multilevel degenerative disc disease in the mid to lower cervical spine, prominent at C6-C7. Having some radiculopathy left arm.  PAIN:  Are you having pain? Yes: NPRS scale: 2/10 Pain location: L cervical spine, paraspinals, shoulder and posterior UE Pain description: aching/dull Aggravating factors: cervical extension, sleeping supine Relieving factors: LUE overhead, cervical flexion  PRECAUTIONS: None  WEIGHT BEARING RESTRICTIONS: No  FALLS:  Has patient fallen in last 6 months? No  LIVING ENVIRONMENT: Lives with: lives with their spouse  OCCUPATION: Holiday representative  PLOF: Independent  PATIENT  GOALS: Improve pain  NEXT MD VISIT: July 2024  OBJECTIVE:   DIAGNOSTIC FINDINGS:  CLINICAL DATA:  Posterior left neck pain for 3 weeks radiating to the left shoulder and upper extremity with left hand numbness intermittently. No reported injury.   EXAM: CERVICAL SPINE - COMPLETE 4+ VIEW   COMPARISON:  None Available.   FINDINGS: On the lateral view the cervical spine is visualized to the level of C6-7 with improved visualization of C7-T1 on the swimmer's view. Straightening of the cervical spine. Pre-vertebral soft tissues are within normal limits. No fracture is detected in the cervical spine. Dens is well positioned between the lateral masses of C1. Moderate multilevel degenerative disc disease in the mid to lower cervical spine, most prominent at C6-7. No cervical spine subluxation. Mild bilateral cervical facet arthropathy. Suggestion of mild degenerative foraminal stenosis on the left at C6-7. No aggressive-appearing focal osseous lesions.   IMPRESSION: 1. Moderate multilevel degenerative disc disease in the mid to lower cervical spine, most prominent at C6-7. 2. Mild bilateral cervical facet arthropathy. 3. Suggestion of mild degenerative foraminal stenosis on the left at C6-7.     Electronically Signed   By: Delbert Phenix M.D.   On: 05/30/2022 12:19  PATIENT SURVEYS:  FOTO deferred to next session   Quick DASH: COGNITION: Overall cognitive status: Within functional limits for tasks assessed  SENSATION: WFL  POSTURE: rounded shoulders, forward head, and increased thoracic kyphosis  PALPATION: TTP along C2-T2 along spinous processes, L cervical paraspinals, upper trap.   CERVICAL ROM:   Active ROM A/PROM (deg) eval  Flexion 55  Extension 25*  Right lateral flexion 37  Left lateral flexion 37  Right rotation 55  Left rotation 50   (Blank rows = not tested)  UPPER EXTREMITY ROM:  Active ROM Right eval Left eval  Shoulder flexion WNL WNL   Shoulder extension    Shoulder abduction WNL WNL  Shoulder adduction    Shoulder extension    Shoulder internal rotation    Shoulder external rotation    Elbow flexion    Elbow extension    Wrist flexion    Wrist extension    Wrist ulnar deviation  Wrist radial deviation    Wrist pronation    Wrist supination     (Blank rows = not tested)  UPPER EXTREMITY MMT:  MMT Right eval Left eval  Shoulder flexion 5 5  Shoulder extension    Shoulder abduction 5 5  Shoulder adduction    Shoulder extension    Shoulder internal rotation 5 5  Shoulder external rotation 5 5  Middle trapezius    Lower trapezius    Elbow flexion 5 5  Elbow extension 5 5  Wrist flexion 5 4  Wrist extension    Wrist ulnar deviation    Wrist radial deviation    Wrist pronation    Wrist supination    Grip strength Symmetrical  Symmetrical   (Blank rows = not tested)  CERVICAL SPECIAL TESTS:  Upper limb tension test (ULTT): Positive, Spurling's test: Negative, and Distraction test: Negative  Radial specific ULTT. Ulnar and median nerve negative  JOINT MOBILITY:   Cervical CPA's PA hypomobile and concordant symptoms C2-T2. Concordant N/T in L hand/fingers with approximate C6-C7 CPA's.   TODAY'S TREATMENT:                                                                                                                              DATE: 07/19/22  There.ex: LUE radial nerve flossing technique to improve radial nerve tension: 1x12. Max multimodal cuing for sequencing LUE and cervical AROM. Onset of LUE N/T with flossing technique even with elimination for head motions.   Seated cervical rotation and extension SNAGS  Extension: x12   R/L rotations: x12/side. PT demo and max multi modal cuing with excellent good carryover.   Seated thoracic extension over half bolster: 2x8   Seated cervical retractions: 2x15  Reviewed updated HEP with cervical AROM progressions.   Manual Therapy: Supine or Prone  for 10 minutes as listed below Prone Grade 3 CPA's C2-C7, 3x10sec per segment to improve cervical extension AROM, moderate LUE radiculopathy N/T symptoms. T1-T8 Grade 3 CPA's to improve thoracic extension mobility. Concordant N/T with C6-T1 grade 3 CPA's.    Supine, 3x30 sec holds for manual, cervical traction for pain modulation and improvements in LUE N/T   Cervical AROM post intervention:   Flexion: 45 deg  Extension: 34 degrees  Rotation R/L: 55/60  degrees  Lateral Flexion R/L: 45/40 degrees   PATIENT EDUCATION:  Education details: POC, prognosis, HEP Person educated: Patient Education method: Programmer, multimedia, Demonstration, and Handouts Education comprehension: verbalized understanding, returned demonstration, and needs further education  HOME EXERCISE PROGRAM: Access Code: [email or web address] URL: [email or web address] Date: 07/19/2022 Prepared by: Ronnie Derby  Exercises - Seated Cervical Retraction  - 1 x daily - 7 x weekly - 2 sets - 15 reps - Seated Upper Trapezius Stretch  - 1 x daily - 7 x weekly - 1 sets - 3 reps - Seated Assisted Cervical Rotation with Towel  - 1 x daily - 7 x weekly - 2 sets - 12 reps -  2-3 hold - Cervical Extension AROM with Strap  - 1 x daily - 7 x weekly - 2 sets - 12 reps - 2-3 hold   Access Code: [email or web address] URL: [email or web address] Date: 07/15/2022 Prepared by: Ronnie Derby  Exercises - Seated Cervical Retraction  - 1 x daily - 7 x weekly - 2 sets - 15 reps - Seated Upper Trapezius Stretch  - 1 x daily - 7 x weekly - 2 sets - 15 reps - Seated Cervical Rotation AROM  - 1 x daily - 7 x weekly - 2 sets - 15 reps  ASSESSMENT:  CLINICAL IMPRESSION: Pt in clinic making significant improvements with pain and cervical AROM as evident above. Pt with 4/10 Pain last session to consistent 1-2/10 NPS since Friday, 07/16/22. Although pt making progress with mobility, pt remains fairly hypomobile in cervical and thoracic spine limiting  mobility with easily provoked LUE N/T symptoms along radial nerve distribution with manual and radial nerve flossing interventions. Pt reports further relief and reduced stiffness of neck post session. Updated HEP with progression of cervical mobility since pt making excellent progress. Encouraged regular HEP compliance and to f/u as able to further improve pt's symptoms. Pt will benefit from skilled PT services to address these impairments and return to PLOF and improve overall QoL.  OBJECTIVE IMPAIRMENTS: decreased ROM, decreased strength, hypomobility, impaired flexibility, impaired sensation, impaired UE functional use, improper body mechanics, postural dysfunction, and pain.   ACTIVITY LIMITATIONS: lifting, standing, and sleeping  PARTICIPATION LIMITATIONS: community activity and occupation  PERSONAL FACTORS: Age, Past/current experiences, Profession, Time since onset of injury/illness/exacerbation, and 3+ comorbidities: HLD, HTN, systemic lupus erythematosus, hx of DVT, hx of knee and cervical OA, aortic atherosclerosis  are also affecting patient's functional outcome.   REHAB POTENTIAL: Good  CLINICAL DECISION MAKING: Evolving/moderate complexity  EVALUATION COMPLEXITY: Moderate   GOALS: Goals reviewed with patient? No  SHORT TERM GOALS: Target date: 08/11/22  Pt will be independent with HEP to improve cervical AROM and radicular pain to return to PLOF. Baseline: 07/14/22: given initial HEP Goal status: INITIAL  LONG TERM GOALS: Target date: 09/08/22  Pt will improve FOTO to target score to demonstrate clinically significant improvement in functional mobility.  Baseline: 07/14/22: deferred to next session; 07/16/22: 56 with target score of 67 Goal status: INITIAL  2.  Pt will improve cervical extension and rotation AROM by at least 7 degrees in these planes for clinically significant improvement in mobility for overhead and rotational ADL's. Baseline: 07/14/22:   Active ROM A/PROM  (deg) eval  Flexion 55  Extension 25*  Right lateral flexion 37  Left lateral flexion 37  Right rotation 55  Left rotation 50   Goal status: INITIAL  3.  Pt will report ability to stand at work for > 1.5 hours with no more than 4/10 pain in NPS to demonstrate clinically significant improvement in pain with standing work tasks.  Baseline: 07/14/22: Onset of 6/10 NPS cervical and LUE pain with standing for 1.5 hours or greater.  Goal status: INITIAL  PLAN:  PT FREQUENCY: 1-2x/week  PT DURATION: 8 weeks  PLANNED INTERVENTIONS: Therapeutic exercises, Therapeutic activity, Neuromuscular re-education, Balance training, Gait training, Patient/Family education, Self Care, Joint mobilization, Joint manipulation, Dry Needling, Electrical stimulation, Spinal mobilization, Cryotherapy, Moist heat, Manual therapy, and Re-evaluation  PLAN FOR NEXT SESSION: cervical AROM, thoracic extension/periscap strengthening   Delphia Grates. Fairly IV, PT, DPT Physical Therapist- [facility]  07/19/2022, 3:20 PM

## 2022-07-21 ENCOUNTER — Ambulatory Visit: Payer: No Typology Code available for payment source

## 2022-07-21 DIAGNOSIS — M542 Cervicalgia: Secondary | ICD-10-CM

## 2022-07-21 DIAGNOSIS — R293 Abnormal posture: Secondary | ICD-10-CM

## 2022-07-21 NOTE — Therapy (Signed)
OUTPATIENT PHYSICAL THERAPY CERVICAL TREATMENT   Patient Name: Dwayne Jacobson MRN: [id number] DOB:November 29, 1955, 67 y.o., male Today's Date: 07/21/2022  END OF SESSION:  PT End of Session - 07/21/22 1425     Visit Number 4    Number of Visits 17    Date for PT Re-Evaluation 09/08/22    PT Start Time 1430    PT Stop Time 1515    PT Time Calculation (min) 45 min    Activity Tolerance Patient tolerated treatment well    Behavior During Therapy WFL for tasks assessed/performed             Past Medical History:  Diagnosis Date   DVT (deep venous thrombosis) (HCC)    Hyperlipidemia    Hypertension    Seizures (HCC)    Skin cancer    Systemic lupus erythematosus (HCC)    Past Surgical History:  Procedure Laterality Date   NO PAST SURGERIES     Patient Active Problem List   Diagnosis Date Noted   Emphysema lung (HCC) 07/06/2022   Aortic atherosclerosis (HCC) 07/06/2022   Degeneration of intervertebral disc of cervical spine without disc herniation 05/30/2022   Erectile dysfunction 03/26/2022   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   Current every day nicotine vaping 10/31/2020   Healthcare maintenance 09/19/2020   Gastroesophageal reflux disease without esophagitis 08/17/2019   Chondrocalcinosis 01/13/2017   Primary osteoarthritis of both knees 01/13/2017   History of DVT (deep vein thrombosis)    Hypertension    Hyperlipidemia    SLE (systemic lupus erythematosus) (HCC) 01/18/2014    PCP: Marjie Skiff, NPT  REFERRING PROVIDER: Aura Dials T, NPT  REFERRING DIAG: M50.30 (ICD-10-CM) - Degeneration of intervertebral disc of cervical spine without disc herniation  THERAPY DIAG:  Cervicalgia  Abnormal posture  Rationale for Evaluation and Treatment: Rehabilitation  ONSET DATE: Early April 2024  SUBJECTIVE:                                                                                                                                                                                                          SUBJECTIVE STATEMENT: Pt reports pain  2/10 NPS central neck pain. Reports N/t is "greatly improved". Compliant with HEP.   Hand dominance: Left  PERTINENT HISTORY:  Pt is a pleasant 67 y.o. male referred to OPPT for cervical radiculopathy. Pt reports gradual onset of L sided neck pain radiating down L arm leading to N/T into his hand and fingers. Began with difficulty sleeping and just neck pain. Pain described as dull/achey in the  neck down the L arm on posterior aspect with dorsal and palmar side of hands/fingers with N/T. Reports N/T as mild to moderate. Not leading to clumsiness and dropping items. Worst pain 6/10 NPS, Best pain on pain meds is 3/10 NPS. Current pain 6/10 NPS. Aggravating factors include: sleeping supine, cervical extension. Relieving factor include: UE elevation over head, cervical flexion, and pain meds. Pt works [employer] retirement. Reports impact at work tasks such as standing work tasks. Denies B/B symptoms, s/s of fever, saddle anesthesia, coordination or balance impairment.   From referring provider:  for ongoing neck pain for 5 weeks, imaging on x-ray noting imaging noting moderate multilevel degenerative disc disease in the mid to lower cervical spine, prominent at C6-C7. Having some radiculopathy left arm.  PAIN:  Are you having pain? Yes: NPRS scale: 2/10 Pain location: L cervical spine, paraspinals, shoulder and posterior UE Pain description: aching/dull Aggravating factors: cervical extension, sleeping supine Relieving factors: LUE overhead, cervical flexion  PRECAUTIONS: None  WEIGHT BEARING RESTRICTIONS: No  FALLS:  Has patient fallen in last 6 months? No  LIVING ENVIRONMENT: Lives with: lives with their spouse  OCCUPATION: Holiday representative  PLOF: Independent  PATIENT GOALS: Improve pain  NEXT MD VISIT: July 2024  OBJECTIVE:   DIAGNOSTIC FINDINGS:  CLINICAL DATA:  Posterior left neck  pain for 3 weeks radiating to the left shoulder and upper extremity with left hand numbness intermittently. No reported injury.   EXAM: CERVICAL SPINE - COMPLETE 4+ VIEW   COMPARISON:  None Available.   FINDINGS: On the lateral view the cervical spine is visualized to the level of C6-7 with improved visualization of C7-T1 on the swimmer's view. Straightening of the cervical spine. Pre-vertebral soft tissues are within normal limits. No fracture is detected in the cervical spine. Dens is well positioned between the lateral masses of C1. Moderate multilevel degenerative disc disease in the mid to lower cervical spine, most prominent at C6-7. No cervical spine subluxation. Mild bilateral cervical facet arthropathy. Suggestion of mild degenerative foraminal stenosis on the left at C6-7. No aggressive-appearing focal osseous lesions.   IMPRESSION: 1. Moderate multilevel degenerative disc disease in the mid to lower cervical spine, most prominent at C6-7. 2. Mild bilateral cervical facet arthropathy. 3. Suggestion of mild degenerative foraminal stenosis on the left at C6-7.     Electronically Signed   By: Delbert Phenix M.D.   On: 05/30/2022 12:19  PATIENT SURVEYS:  FOTO deferred to next session   Quick DASH: COGNITION: Overall cognitive status: Within functional limits for tasks assessed  SENSATION: WFL  POSTURE: rounded shoulders, forward head, and increased thoracic kyphosis  PALPATION: TTP along C2-T2 along spinous processes, L cervical paraspinals, upper trap.   CERVICAL ROM:   Active ROM A/PROM (deg) eval  Flexion 55  Extension 25*  Right lateral flexion 37  Left lateral flexion 37  Right rotation 55  Left rotation 50   (Blank rows = not tested)  UPPER EXTREMITY ROM:  Active ROM Right eval Left eval  Shoulder flexion WNL WNL  Shoulder extension    Shoulder abduction WNL WNL  Shoulder adduction    Shoulder extension    Shoulder internal rotation     Shoulder external rotation    Elbow flexion    Elbow extension    Wrist flexion    Wrist extension    Wrist ulnar deviation    Wrist radial deviation    Wrist pronation    Wrist supination     (Blank rows =  not tested)  UPPER EXTREMITY MMT:  MMT Right eval Left eval  Shoulder flexion 5 5  Shoulder extension    Shoulder abduction 5 5  Shoulder adduction    Shoulder extension    Shoulder internal rotation 5 5  Shoulder external rotation 5 5  Middle trapezius    Lower trapezius    Elbow flexion 5 5  Elbow extension 5 5  Wrist flexion 5 4  Wrist extension    Wrist ulnar deviation    Wrist radial deviation    Wrist pronation    Wrist supination    Grip strength Symmetrical  Symmetrical   (Blank rows = not tested)  CERVICAL SPECIAL TESTS:  Upper limb tension test (ULTT): Positive, Spurling's test: Negative, and Distraction test: Negative  Radial specific ULTT. Ulnar and median nerve negative  JOINT MOBILITY:   Cervical CPA's PA hypomobile and concordant symptoms C2-T2. Concordant N/T in L hand/fingers with approximate C6-C7 CPA's.   TODAY'S TREATMENT:                                                                                                                              DATE: 07/21/22  There.ex:  Nu-Step L4 for 5 min for gentle thoracic mobility and periscap strengthening.  Seated B AAROM shoulder flexion with dowel and 7# AW for thoracic extension: 2x12  LUE radial nerve tensioners. X12. Min VC's for elbow extension and shoulder extension. Excellent carryover.   Seated cervical rotation and extension SNAGS  Extension: x12. Excellent carryover from prior session.   R/L rotations: x12/side. Excellent carryover from prior session.   Seated cervical retractions into green ball at occiput to improve DNF strength: 3x8  Seated scap retractions: 3x8, 45#  Seated lat pulldown: 3x8, 45#    Cervical AROM post intervention:   Extension: 32 degrees  Rotation  R/L: 50/55 degrees     PATIENT EDUCATION:  Education details: POC, prognosis, HEP Person educated: Patient Education method: Programmer, multimedia, Demonstration, and Handouts Education comprehension: verbalized understanding, returned demonstration, and needs further education  HOME EXERCISE PROGRAM: Access Code: [email or web address] URL: [email or web address] Date: 07/19/2022 Prepared by: Ronnie Derby  Exercises - Seated Cervical Retraction  - 1 x daily - 7 x weekly - 2 sets - 15 reps - Seated Upper Trapezius Stretch  - 1 x daily - 7 x weekly - 1 sets - 3 reps - Seated Assisted Cervical Rotation with Towel  - 1 x daily - 7 x weekly - 2 sets - 12 reps - 2-3 hold - Cervical Extension AROM with Strap  - 1 x daily - 7 x weekly - 2 sets - 12 reps - 2-3 hold   Access Code: [email or web address] URL: [email or web address] Date: 07/15/2022 Prepared by: Ronnie Derby  Exercises - Seated Cervical Retraction  - 1 x daily - 7 x weekly - 2 sets - 15 reps - Seated Upper Trapezius Stretch  - 1 x daily - 7 x weekly - 2 sets - 15  reps - Seated Cervical Rotation AROM  - 1 x daily - 7 x weekly - 2 sets - 15 reps  ASSESSMENT:  CLINICAL IMPRESSION: Continuing PT POC with heavy focus on cervical mobility and thoracic extension mobility to reduce lower cervical lordosis and thoracic kyphosis. Pt with excellent understanding of updated HEP leading to improved cervicothoracic mobility and reduced pain and N/T in neck and LUE. Pt able to tolerate progression in DNF and periscapular strengthening without onset of symptoms. Pt will benefit from skilled PT services to address these impairments and return to PLOF and improve overall QoL.  OBJECTIVE IMPAIRMENTS: decreased ROM, decreased strength, hypomobility, impaired flexibility, impaired sensation, impaired UE functional use, improper body mechanics, postural dysfunction, and pain.   ACTIVITY LIMITATIONS: lifting, standing, and sleeping  PARTICIPATION  LIMITATIONS: community activity and occupation  PERSONAL FACTORS: Age, Past/current experiences, Profession, Time since onset of injury/illness/exacerbation, and 3+ comorbidities: HLD, HTN, systemic lupus erythematosus, hx of DVT, hx of knee and cervical OA, aortic atherosclerosis  are also affecting patient's functional outcome.   REHAB POTENTIAL: Good  CLINICAL DECISION MAKING: Evolving/moderate complexity  EVALUATION COMPLEXITY: Moderate   GOALS: Goals reviewed with patient? No  SHORT TERM GOALS: Target date: 08/11/22  Pt will be independent with HEP to improve cervical AROM and radicular pain to return to PLOF. Baseline: 07/14/22: given initial HEP Goal status: INITIAL  LONG TERM GOALS: Target date: 09/08/22  Pt will improve FOTO to target score to demonstrate clinically significant improvement in functional mobility.  Baseline: 07/14/22: deferred to next session; 07/16/22: 56 with target score of 67 Goal status: INITIAL  2.  Pt will improve cervical extension and rotation AROM by at least 7 degrees in these planes for clinically significant improvement in mobility for overhead and rotational ADL's. Baseline: 07/14/22:   Active ROM A/PROM (deg) eval  Flexion 55  Extension 25*  Right lateral flexion 37  Left lateral flexion 37  Right rotation 55  Left rotation 50   Goal status: INITIAL  3.  Pt will report ability to stand at work for > 1.5 hours with no more than 4/10 pain in NPS to demonstrate clinically significant improvement in pain with standing work tasks.  Baseline: 07/14/22: Onset of 6/10 NPS cervical and LUE pain with standing for 1.5 hours or greater.  Goal status: INITIAL  PLAN:  PT FREQUENCY: 1-2x/week  PT DURATION: 8 weeks  PLANNED INTERVENTIONS: Therapeutic exercises, Therapeutic activity, Neuromuscular re-education, Balance training, Gait training, Patient/Family education, Self Care, Joint mobilization, Joint manipulation, Dry Needling, Electrical  stimulation, Spinal mobilization, Cryotherapy, Moist heat, Manual therapy, and Re-evaluation  PLAN FOR NEXT SESSION: cervical AROM, thoracic extension/periscap strengthening   Delphia Grates. Fairly IV, PT, DPT Physical Therapist- [place]  [facility]  07/21/2022, 3:43 PM

## 2022-07-26 ENCOUNTER — Ambulatory Visit: Payer: No Typology Code available for payment source

## 2022-07-26 DIAGNOSIS — R293 Abnormal posture: Secondary | ICD-10-CM

## 2022-07-26 DIAGNOSIS — M542 Cervicalgia: Secondary | ICD-10-CM | POA: Diagnosis not present

## 2022-07-26 NOTE — Therapy (Signed)
OUTPATIENT PHYSICAL THERAPY CERVICAL TREATMENT   Patient Name: Dwayne Jacobson MRN: [id number] DOB:1955/11/07, 67 y.o., male Today's Date: 07/26/2022  END OF SESSION:  PT End of Session - 07/26/22 1419     Visit Number 5    Number of Visits 17    Date for PT Re-Evaluation 09/08/22    PT Start Time 1420    PT Stop Time 1504    PT Time Calculation (min) 44 min    Activity Tolerance Patient tolerated treatment well    Behavior During Therapy [facility] for tasks assessed/performed             Past Medical History:  Diagnosis Date   DVT (deep venous thrombosis) (HCC)    Hyperlipidemia    Hypertension    Seizures (HCC)    Skin cancer    Systemic lupus erythematosus (HCC)    Past Surgical History:  Procedure Laterality Date   NO PAST SURGERIES     Patient Active Problem List   Diagnosis Date Noted   Emphysema lung (HCC) 07/06/2022   Aortic atherosclerosis (HCC) 07/06/2022   Degeneration of intervertebral disc of cervical spine without disc herniation 05/30/2022   Erectile dysfunction 03/26/2022   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   Current every day nicotine vaping 10/31/2020   Healthcare maintenance 09/19/2020   Gastroesophageal reflux disease without esophagitis 08/17/2019   Chondrocalcinosis 01/13/2017   Primary osteoarthritis of both knees 01/13/2017   History of DVT (deep vein thrombosis)    Hypertension    Hyperlipidemia    SLE (systemic lupus erythematosus) (HCC) 01/18/2014    PCP: Marjie Skiff, NPT  REFERRING PROVIDER: Aura Dials T, NPT  REFERRING DIAG: M50.30 (ICD-10-CM) - Degeneration of intervertebral disc of cervical spine without disc herniation  THERAPY DIAG:  Cervicalgia  Abnormal posture  Rationale for Evaluation and Treatment: Rehabilitation  ONSET DATE: Early April 2024  SUBJECTIVE:                                                                                                                                                                                                          SUBJECTIVE STATEMENT: Pt reports no pain this date. Continues to express improvement in symptoms. Compliant with updated HEP.   Hand dominance: Left  PERTINENT HISTORY:  Pt is a pleasant 67 y.o. male referred to OPPT for cervical radiculopathy. Pt reports gradual onset of L sided neck pain radiating down L arm leading to N/T into his hand and fingers. Began with difficulty sleeping and just neck pain. Pain described as dull/achey in the neck  down the L arm on posterior aspect with dorsal and palmar side of hands/fingers with N/T. Reports N/T as mild to moderate. Not leading to clumsiness and dropping items. Worst pain 6/10 NPS, Best pain on pain meds is 3/10 NPS. Current pain 6/10 NPS. Aggravating factors include: sleeping supine, cervical extension. Relieving factor include: UE elevation over head, cervical flexion, and pain meds. Pt works [employer] retirement. Reports impact at work tasks such as standing work tasks. Denies B/B symptoms, s/s of fever, saddle anesthesia, coordination or balance impairment.   From referring provider:  for ongoing neck pain for 5 weeks, imaging on x-ray noting imaging noting moderate multilevel degenerative disc disease in the mid to lower cervical spine, prominent at C6-C7. Having some radiculopathy left arm.  PAIN:  Are you having pain? Yes: NPRS scale: 0/10 Pain location: L cervical spine, paraspinals, shoulder and posterior UE Pain description: aching/dull Aggravating factors: cervical extension, sleeping supine Relieving factors: LUE overhead, cervical flexion  PRECAUTIONS: None  WEIGHT BEARING RESTRICTIONS: No  FALLS:  Has patient fallen in last 6 months? No  LIVING ENVIRONMENT: Lives with: lives with their spouse  OCCUPATION: Holiday representative  PLOF: Independent  PATIENT GOALS: Improve pain  NEXT MD VISIT: July 2024  OBJECTIVE:   DIAGNOSTIC FINDINGS:  CLINICAL DATA:  Posterior left  neck pain for 3 weeks radiating to the left shoulder and upper extremity with left hand numbness intermittently. No reported injury.   EXAM: CERVICAL SPINE - COMPLETE 4+ VIEW   COMPARISON:  None Available.   FINDINGS: On the lateral view the cervical spine is visualized to the level of C6-7 with improved visualization of C7-T1 on the swimmer's view. Straightening of the cervical spine. Pre-vertebral soft tissues are within normal limits. No fracture is detected in the cervical spine. Dens is well positioned between the lateral masses of C1. Moderate multilevel degenerative disc disease in the mid to lower cervical spine, most prominent at C6-7. No cervical spine subluxation. Mild bilateral cervical facet arthropathy. Suggestion of mild degenerative foraminal stenosis on the left at C6-7. No aggressive-appearing focal osseous lesions.   IMPRESSION: 1. Moderate multilevel degenerative disc disease in the mid to lower cervical spine, most prominent at C6-7. 2. Mild bilateral cervical facet arthropathy. 3. Suggestion of mild degenerative foraminal stenosis on the left at C6-7.     Electronically Signed   By: Delbert Phenix M.D.   On: 05/30/2022 12:19  PATIENT SURVEYS:  FOTO deferred to next session   Quick DASH: COGNITION: Overall cognitive status: Within functional limits for tasks assessed  SENSATION: WFL  POSTURE: rounded shoulders, forward head, and increased thoracic kyphosis  PALPATION: TTP along C2-T2 along spinous processes, L cervical paraspinals, upper trap.   CERVICAL ROM:   Active ROM A/PROM (deg) eval  Flexion 55  Extension 25*  Right lateral flexion 37  Left lateral flexion 37  Right rotation 55  Left rotation 50   (Blank rows = not tested)  UPPER EXTREMITY ROM:  Active ROM Right eval Left eval  Shoulder flexion WNL WNL  Shoulder extension    Shoulder abduction WNL WNL  Shoulder adduction    Shoulder extension    Shoulder internal rotation     Shoulder external rotation    Elbow flexion    Elbow extension    Wrist flexion    Wrist extension    Wrist ulnar deviation    Wrist radial deviation    Wrist pronation    Wrist supination     (Blank rows =  not tested)  UPPER EXTREMITY MMT:  MMT Right eval Left eval  Shoulder flexion 5 5  Shoulder extension    Shoulder abduction 5 5  Shoulder adduction    Shoulder extension    Shoulder internal rotation 5 5  Shoulder external rotation 5 5  Middle trapezius    Lower trapezius    Elbow flexion 5 5  Elbow extension 5 5  Wrist flexion 5 4  Wrist extension    Wrist ulnar deviation    Wrist radial deviation    Wrist pronation    Wrist supination    Grip strength Symmetrical  Symmetrical   (Blank rows = not tested)  CERVICAL SPECIAL TESTS:  Upper limb tension test (ULTT): Positive, Spurling's test: Negative, and Distraction test: Negative  Radial specific ULTT. Ulnar and median nerve negative  JOINT MOBILITY:   Cervical CPA's PA hypomobile and concordant symptoms C2-T2. Concordant N/T in L hand/fingers with approximate C6-C7 CPA's.   TODAY'S TREATMENT:                                                                                                                              DATE: 07/26/22  There.ex:  Nu-Step L5 for 5 min for gentle thoracic mobility and periscap strengthening.  Seated B AAROM shoulder flexion with dowel and 10# AW for thoracic extension: 2x15  TRX scap retractions: 2x12, SBA  Seated scap retractions: 3x12, 45#  Seated lat pulldown: 3x12, 45#  Seated chest press: 3x12, 45#  Standing low row/shoulder extension: blue TB, 3x12   Seated cervical retractions into green ball at occiput to improve DNF strength: 3x12    PATIENT EDUCATION:  Education details: POC, prognosis, HEP Person educated: Patient Education method: Explanation, Demonstration, and Handouts Education comprehension: verbalized understanding, returned demonstration, and needs  further education  HOME EXERCISE PROGRAM: Access Code: [email or web address] URL: [email or web address] Date: 07/19/2022 Prepared by: Ronnie Derby  Exercises - Seated Cervical Retraction  - 1 x daily - 7 x weekly - 2 sets - 15 reps - Seated Upper Trapezius Stretch  - 1 x daily - 7 x weekly - 1 sets - 3 reps - Seated Assisted Cervical Rotation with Towel  - 1 x daily - 7 x weekly - 2 sets - 12 reps - 2-3 hold - Cervical Extension AROM with Strap  - 1 x daily - 7 x weekly - 2 sets - 12 reps - 2-3 hold   Access Code: [email or web address] URL: [email or web address] Date: 07/15/2022 Prepared by: Ronnie Derby  Exercises - Seated Cervical Retraction  - 1 x daily - 7 x weekly - 2 sets - 15 reps - Seated Upper Trapezius Stretch  - 1 x daily - 7 x weekly - 2 sets - 15 reps - Seated Cervical Rotation AROM  - 1 x daily - 7 x weekly - 2 sets - 15 reps  ASSESSMENT:  CLINICAL IMPRESSION: Focus of session on progression of periscapular strengthening to address likely postural deficits causing  cervical pain and radiculopathy symptoms. Pt pain free prior to session, during, and post session tolerating overall greater volume of exercise this date. Educated to continue to address cervical mobility at home particularly rotation with HEP. Pt making excellent progress in POC but will continue to benefit from skilled PT to address postural deficits and strength training to prevent future cervical pain and improve QoL.  OBJECTIVE IMPAIRMENTS: decreased ROM, decreased strength, hypomobility, impaired flexibility, impaired sensation, impaired UE functional use, improper body mechanics, postural dysfunction, and pain.   ACTIVITY LIMITATIONS: lifting, standing, and sleeping  PARTICIPATION LIMITATIONS: community activity and occupation  PERSONAL FACTORS: Age, Past/current experiences, Profession, Time since onset of injury/illness/exacerbation, and 3+ comorbidities: HLD, HTN, systemic lupus erythematosus,  hx of DVT, hx of knee and cervical OA, aortic atherosclerosis  are also affecting patient's functional outcome.   REHAB POTENTIAL: Good  CLINICAL DECISION MAKING: Evolving/moderate complexity  EVALUATION COMPLEXITY: Moderate   GOALS: Goals reviewed with patient? No  SHORT TERM GOALS: Target date: 08/11/22  Pt will be independent with HEP to improve cervical AROM and radicular pain to return to PLOF. Baseline: 07/14/22: given initial HEP Goal status: INITIAL  LONG TERM GOALS: Target date: 09/08/22  Pt will improve FOTO to target score to demonstrate clinically significant improvement in functional mobility.  Baseline: 07/14/22: deferred to next session; 07/16/22: 56 with target score of 67 Goal status: INITIAL  2.  Pt will improve cervical extension and rotation AROM by at least 7 degrees in these planes for clinically significant improvement in mobility for overhead and rotational ADL's. Baseline: 07/14/22:   Active ROM A/PROM (deg) eval  Flexion 55  Extension 25*  Right lateral flexion 37  Left lateral flexion 37  Right rotation 55  Left rotation 50   Goal status: INITIAL  3.  Pt will report ability to stand at work for > 1.5 hours with no more than 4/10 pain in NPS to demonstrate clinically significant improvement in pain with standing work tasks.  Baseline: 07/14/22: Onset of 6/10 NPS cervical and LUE pain with standing for 1.5 hours or greater.  Goal status: INITIAL  PLAN:  PT FREQUENCY: 1-2x/week  PT DURATION: 8 weeks  PLANNED INTERVENTIONS: Therapeutic exercises, Therapeutic activity, Neuromuscular re-education, Balance training, Gait training, Patient/Family education, Self Care, Joint mobilization, Joint manipulation, Dry Needling, Electrical stimulation, Spinal mobilization, Cryotherapy, Moist heat, Manual therapy, and Re-evaluation  PLAN FOR NEXT SESSION: cervical AROM, thoracic extension/periscap strengthening   Delphia Grates. Fairly IV, PT, DPT Physical Therapist-  [place]  [facility]  07/26/2022, 3:12 PM

## 2022-07-30 ENCOUNTER — Ambulatory Visit: Payer: No Typology Code available for payment source

## 2022-07-30 DIAGNOSIS — M542 Cervicalgia: Secondary | ICD-10-CM

## 2022-07-30 DIAGNOSIS — R293 Abnormal posture: Secondary | ICD-10-CM

## 2022-07-30 NOTE — Therapy (Signed)
OUTPATIENT PHYSICAL THERAPY CERVICAL TREATMENT   Patient Name: Dwayne Jacobson MRN: [id number] DOB:04-Mar-1955, 67 y.o., male Today's Date: 07/30/2022  END OF SESSION:  PT End of Session - 07/30/22 1020     Visit Number 6    Number of Visits 17    Date for PT Re-Evaluation 09/08/22    PT Start Time 1018    PT Stop Time 1100    PT Time Calculation (min) 42 min    Activity Tolerance Patient tolerated treatment well    Behavior During Therapy WFL for tasks assessed/performed             Past Medical History:  Diagnosis Date   DVT (deep venous thrombosis) (HCC)    Hyperlipidemia    Hypertension    Seizures (HCC)    Skin cancer    Systemic lupus erythematosus (HCC)    Past Surgical History:  Procedure Laterality Date   NO PAST SURGERIES     Patient Active Problem List   Diagnosis Date Noted   Emphysema lung (HCC) 07/06/2022   Aortic atherosclerosis (HCC) 07/06/2022   Degeneration of intervertebral disc of cervical spine without disc herniation 05/30/2022   Erectile dysfunction 03/26/2022   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   Current every day nicotine vaping 10/31/2020   Healthcare maintenance 09/19/2020   Gastroesophageal reflux disease without esophagitis 08/17/2019   Chondrocalcinosis 01/13/2017   Primary osteoarthritis of both knees 01/13/2017   History of DVT (deep vein thrombosis)    Hypertension    Hyperlipidemia    SLE (systemic lupus erythematosus) (HCC) 01/18/2014    PCP: Marjie Skiff, NPT  REFERRING PROVIDER: Aura Dials T, NPT  REFERRING DIAG: M50.30 (ICD-10-CM) - Degeneration of intervertebral disc of cervical spine without disc herniation  THERAPY DIAG:  Cervicalgia  Abnormal posture  Rationale for Evaluation and Treatment: Rehabilitation  ONSET DATE: Early April 2024  SUBJECTIVE:                                                                                                                                                                                                          SUBJECTIVE STATEMENT: Pt reports mild pain in neck 1-2/10 NPS. States significant soreness in B shoulders and arms after last session.   Hand dominance: Left  PERTINENT HISTORY:  Pt is a pleasant 67 y.o. male referred to OPPT for cervical radiculopathy. Pt reports gradual onset of L sided neck pain radiating down L arm leading to N/T into his hand and fingers. Began with difficulty sleeping and just neck pain. Pain described as dull/achey  in the neck down the L arm on posterior aspect with dorsal and palmar side of hands/fingers with N/T. Reports N/T as mild to moderate. Not leading to clumsiness and dropping items. Worst pain 6/10 NPS, Best pain on pain meds is 3/10 NPS. Current pain 6/10 NPS. Aggravating factors include: sleeping supine, cervical extension. Relieving factor include: UE elevation over head, cervical flexion, and pain meds. Pt works Public librarian retirement. Reports impact at work tasks such as standing work tasks. Denies B/B symptoms, s/s of fever, saddle anesthesia, coordination or balance impairment.   From referring provider:  for ongoing neck pain for 5 weeks, imaging on x-ray noting imaging noting moderate multilevel degenerative disc disease in the mid to lower cervical spine, prominent at C6-C7. Having some radiculopathy left arm.  PAIN:  Are you having pain? Yes: NPRS scale: 1-2/10 Pain location: L cervical spine, paraspinals, shoulder and posterior UE Pain description: aching/dull Aggravating factors: cervical extension, sleeping supine Relieving factors: LUE overhead, cervical flexion  PRECAUTIONS: None  WEIGHT BEARING RESTRICTIONS: No  FALLS:  Has patient fallen in last 6 months? No  LIVING ENVIRONMENT: Lives with: lives with their spouse  OCCUPATION: Holiday representative  PLOF: Independent  PATIENT GOALS: Improve pain  NEXT MD VISIT: July 2024  OBJECTIVE:   DIAGNOSTIC FINDINGS:  CLINICAL DATA:   Posterior left neck pain for 3 weeks radiating to the left shoulder and upper extremity with left hand numbness intermittently. No reported injury.   EXAM: CERVICAL SPINE - COMPLETE 4+ VIEW   COMPARISON:  None Available.   FINDINGS: On the lateral view the cervical spine is visualized to the level of C6-7 with improved visualization of C7-T1 on the swimmer's view. Straightening of the cervical spine. Pre-vertebral soft tissues are within normal limits. No fracture is detected in the cervical spine. Dens is well positioned between the lateral masses of C1. Moderate multilevel degenerative disc disease in the mid to lower cervical spine, most prominent at C6-7. No cervical spine subluxation. Mild bilateral cervical facet arthropathy. Suggestion of mild degenerative foraminal stenosis on the left at C6-7. No aggressive-appearing focal osseous lesions.   IMPRESSION: 1. Moderate multilevel degenerative disc disease in the mid to lower cervical spine, most prominent at C6-7. 2. Mild bilateral cervical facet arthropathy. 3. Suggestion of mild degenerative foraminal stenosis on the left at C6-7.     Electronically Signed   By: Delbert Phenix M.D.   On: 05/30/2022 12:19  PATIENT SURVEYS:  FOTO deferred to next session   Quick DASH: COGNITION: Overall cognitive status: Within functional limits for tasks assessed  SENSATION: WFL  POSTURE: rounded shoulders, forward head, and increased thoracic kyphosis  PALPATION: TTP along C2-T2 along spinous processes, L cervical paraspinals, upper trap.   CERVICAL ROM:   Active ROM A/PROM (deg) eval  Flexion 55  Extension 25*  Right lateral flexion 37  Left lateral flexion 37  Right rotation 55  Left rotation 50   (Blank rows = not tested)  UPPER EXTREMITY ROM:  Active ROM Right eval Left eval  Shoulder flexion WNL WNL  Shoulder extension    Shoulder abduction WNL WNL  Shoulder adduction    Shoulder extension    Shoulder  internal rotation    Shoulder external rotation    Elbow flexion    Elbow extension    Wrist flexion    Wrist extension    Wrist ulnar deviation    Wrist radial deviation    Wrist pronation    Wrist supination     (  Blank rows = not tested)  UPPER EXTREMITY MMT:  MMT Right eval Left eval  Shoulder flexion 5 5  Shoulder extension    Shoulder abduction 5 5  Shoulder adduction    Shoulder extension    Shoulder internal rotation 5 5  Shoulder external rotation 5 5  Middle trapezius    Lower trapezius    Elbow flexion 5 5  Elbow extension 5 5  Wrist flexion 5 4  Wrist extension    Wrist ulnar deviation    Wrist radial deviation    Wrist pronation    Wrist supination    Grip strength Symmetrical  Symmetrical   (Blank rows = not tested)  CERVICAL SPECIAL TESTS:  Upper limb tension test (ULTT): Positive, Spurling's test: Negative, and Distraction test: Negative  Radial specific ULTT. Ulnar and median nerve negative  JOINT MOBILITY:   Cervical CPA's PA hypomobile and concordant symptoms C2-T2. Concordant N/T in L hand/fingers with approximate C6-C7 CPA's.   TODAY'S TREATMENT:                                                                                                                              DATE: 07/30/22  There.ex:  UBE: L4 for 2 min forward, 2 min backward for warm up   Seated cervical SNAGS:   Extension: x12  Rotation R/L: x12   Seated B AAROM shoulder flexion with dowel and 17# AW for thoracic extension: 2x15  Seated cervical retractions into green ball at occiput to improve DNF strength: 3x12  Standing/seated TB exercises added to HEP:   Scap retractions: 2x8   T rows: 2x8   B shoulder extension/ low row: 2x8   Reviewed how to anchor band appropriately. Provided TB for home use.    PATIENT EDUCATION:  Education details: POC, prognosis, HEP Person educated: Patient Education method: Explanation, Demonstration, and Handouts Education  comprehension: verbalized understanding, returned demonstration, and needs further education  HOME EXERCISE PROGRAM: Access Code: [email or web address] URL: [email or web address] Date: 07/30/2022 Prepared by: Ronnie Derby  Exercises - Seated Cervical Retraction  - 1 x daily - 7 x weekly - 2 sets - 15 reps - Seated Upper Trapezius Stretch  - 1 x daily - 7 x weekly - 1 sets - 3 reps - Seated Assisted Cervical Rotation with Towel  - 1 x daily - 7 x weekly - 2 sets - 12 reps - 2-3 hold - Cervical Extension AROM with Strap  - 1 x daily - 7 x weekly - 2 sets - 12 reps - 2-3 hold - Scapular Retraction with Resistance  - 1 x daily - 3-4 x weekly - 3 sets - 8 reps - Scapular Retraction with Resistance Advanced  - 1 x daily - 3-4 x weekly - 3 sets - 8 reps - Standing Row with Resistance with Anchored Resistance at Chest Height Palms Down  - 1 x daily - 3-4 x weekly - 3 sets - 8 reps  Access Code: [email or web address] URL: [email or web address] Date: 07/19/2022 Prepared by: Ronnie Derby  Exercises - Seated Cervical Retraction  - 1 x daily - 7 x weekly - 2 sets - 15 reps - Seated Upper Trapezius Stretch  - 1 x daily - 7 x weekly - 1 sets - 3 reps - Seated Assisted Cervical Rotation with Towel  - 1 x daily - 7 x weekly - 2 sets - 12 reps - 2-3 hold - Cervical Extension AROM with Strap  - 1 x daily - 7 x weekly - 2 sets - 12 reps - 2-3 hold   Access Code: [email or web address] URL: [email or web address] Date: 07/15/2022 Prepared by: Ronnie Derby  Exercises - Seated Cervical Retraction  - 1 x daily - 7 x weekly - 2 sets - 15 reps - Seated Upper Trapezius Stretch  - 1 x daily - 7 x weekly - 2 sets - 15 reps - Seated Cervical Rotation AROM  - 1 x daily - 7 x weekly - 2 sets - 15 reps  ASSESSMENT:  CLINICAL IMPRESSION: Updated HEP today to further progress postural and periscapular strength. Mainly focused on HEP updates and cervical mobility as pt reports continued soreness form  last session. Pt with excellent understanding of all exercises indicative of consistent home performance. Still remaining with some mild pain with prolonged standing at work likely due to back weakness and forward head and kyphotic posture. Pt will continue to benefit from skilled PT to address postural deficits and strength training to prevent future cervical pain and improve QoL.  OBJECTIVE IMPAIRMENTS: decreased ROM, decreased strength, hypomobility, impaired flexibility, impaired sensation, impaired UE functional use, improper body mechanics, postural dysfunction, and pain.   ACTIVITY LIMITATIONS: lifting, standing, and sleeping  PARTICIPATION LIMITATIONS: community activity and occupation  PERSONAL FACTORS: Age, Past/current experiences, Profession, Time since onset of injury/illness/exacerbation, and 3+ comorbidities: HLD, HTN, systemic lupus erythematosus, hx of DVT, hx of knee and cervical OA, aortic atherosclerosis  are also affecting patient's functional outcome.   REHAB POTENTIAL: Good  CLINICAL DECISION MAKING: Evolving/moderate complexity  EVALUATION COMPLEXITY: Moderate   GOALS: Goals reviewed with patient? No  SHORT TERM GOALS: Target date: 08/11/22  Pt will be independent with HEP to improve cervical AROM and radicular pain to return to PLOF. Baseline: 07/14/22: given initial HEP Goal status: INITIAL  LONG TERM GOALS: Target date: 09/08/22  Pt will improve FOTO to target score to demonstrate clinically significant improvement in functional mobility.  Baseline: 07/14/22: deferred to next session; 07/16/22: 56 with target score of 67 Goal status: INITIAL  2.  Pt will improve cervical extension and rotation AROM by at least 7 degrees in these planes for clinically significant improvement in mobility for overhead and rotational ADL's. Baseline: 07/14/22:   Active ROM A/PROM (deg) eval  Flexion 55  Extension 25*  Right lateral flexion 37  Left lateral flexion 37  Right  rotation 55  Left rotation 50   Goal status: INITIAL  3.  Pt will report ability to stand at work for > 1.5 hours with no more than 4/10 pain in NPS to demonstrate clinically significant improvement in pain with standing work tasks.  Baseline: 07/14/22: Onset of 6/10 NPS cervical and LUE pain with standing for 1.5 hours or greater.  Goal status: INITIAL  PLAN:  PT FREQUENCY: 1-2x/week  PT DURATION: 8 weeks  PLANNED INTERVENTIONS: Therapeutic exercises, Therapeutic activity, Neuromuscular re-education, Balance training, Gait training, Patient/Family education, Self Care, Joint mobilization, Joint manipulation, Dry Needling, Electrical stimulation, Spinal mobilization, Cryotherapy,  Moist heat, Manual therapy, and Re-evaluation  PLAN FOR NEXT SESSION: cervical AROM, thoracic extension/periscap strengthening   Delphia Grates. Fairly IV, PT, DPT Physical Therapist- [place]  [facility]  07/30/2022, 11:06 AM

## 2022-08-06 ENCOUNTER — Ambulatory Visit: Payer: No Typology Code available for payment source | Attending: Nurse Practitioner

## 2022-08-06 DIAGNOSIS — R293 Abnormal posture: Secondary | ICD-10-CM | POA: Diagnosis present

## 2022-08-06 DIAGNOSIS — M542 Cervicalgia: Secondary | ICD-10-CM | POA: Insufficient documentation

## 2022-08-06 NOTE — Therapy (Signed)
OUTPATIENT PHYSICAL THERAPY CERVICAL TREATMENT   Patient Name: Dwayne Jacobson MRN: [id number] DOB:1956/01/21, 67 y.o., male Today's Date: 08/06/2022  END OF SESSION:  PT End of Session - 08/06/22 1120     Visit Number 7    Number of Visits 17    Date for PT Re-Evaluation 09/08/22    PT Start Time 1115    PT Stop Time 1159    PT Time Calculation (min) 44 min    Activity Tolerance Patient tolerated treatment well    Behavior During Therapy [facility] for tasks assessed/performed             Past Medical History:  Diagnosis Date   DVT (deep venous thrombosis) (HCC)    Hyperlipidemia    Hypertension    Seizures (HCC)    Skin cancer    Systemic lupus erythematosus (HCC)    Past Surgical History:  Procedure Laterality Date   NO PAST SURGERIES     Patient Active Problem List   Diagnosis Date Noted   Emphysema lung (HCC) 07/06/2022   Aortic atherosclerosis (HCC) 07/06/2022   Degeneration of intervertebral disc of cervical spine without disc herniation 05/30/2022   Erectile dysfunction 03/26/2022   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   Current every day nicotine vaping 10/31/2020   Healthcare maintenance 09/19/2020   Gastroesophageal reflux disease without esophagitis 08/17/2019   Chondrocalcinosis 01/13/2017   Primary osteoarthritis of both knees 01/13/2017   History of DVT (deep vein thrombosis)    Hypertension    Hyperlipidemia    SLE (systemic lupus erythematosus) (HCC) 01/18/2014    PCP: Marjie Skiff, NPT  REFERRING PROVIDER: Aura Dials T, NPT  REFERRING DIAG: M50.30 (ICD-10-CM) - Degeneration of intervertebral disc of cervical spine without disc herniation  THERAPY DIAG:  Cervicalgia  Abnormal posture  Rationale for Evaluation and Treatment: Rehabilitation  ONSET DATE: Early April 2024  SUBJECTIVE:                                                                                                                                                                                                          SUBJECTIVE STATEMENT: Pt reports mild pain in neck 1-2/10 NPS. States mild soreness from last session.  Hand dominance: Left  PERTINENT HISTORY:  Pt is a pleasant 67 y.o. male referred to OPPT for cervical radiculopathy. Pt reports gradual onset of L sided neck pain radiating down L arm leading to N/T into his hand and fingers. Began with difficulty sleeping and just neck pain. Pain described as dull/achey in the neck down the L  arm on posterior aspect with dorsal and palmar side of hands/fingers with N/T. Reports N/T as mild to moderate. Not leading to clumsiness and dropping items. Worst pain 6/10 NPS, Best pain on pain meds is 3/10 NPS. Current pain 6/10 NPS. Aggravating factors include: sleeping supine, cervical extension. Relieving factor include: UE elevation over head, cervical flexion, and pain meds. Pt works Public librarian retirement. Reports impact at work tasks such as standing work tasks. Denies B/B symptoms, s/s of fever, saddle anesthesia, coordination or balance impairment.   From referring provider:  for ongoing neck pain for 5 weeks, imaging on x-ray noting imaging noting moderate multilevel degenerative disc disease in the mid to lower cervical spine, prominent at C6-C7. Having some radiculopathy left arm.  PAIN:  Are you having pain? Yes: NPRS scale: 1-2/10 Pain location: L cervical spine, paraspinals, shoulder and posterior UE Pain description: aching/dull Aggravating factors: cervical extension, sleeping supine Relieving factors: LUE overhead, cervical flexion  PRECAUTIONS: None  WEIGHT BEARING RESTRICTIONS: No  FALLS:  Has patient fallen in last 6 months? No  LIVING ENVIRONMENT: Lives with: lives with their spouse  OCCUPATION: Holiday representative  PLOF: Independent  PATIENT GOALS: Improve pain  NEXT MD VISIT: July 2024  OBJECTIVE:   DIAGNOSTIC FINDINGS:  CLINICAL DATA:  Posterior left neck pain for 3 weeks  radiating to the left shoulder and upper extremity with left hand numbness intermittently. No reported injury.   EXAM: CERVICAL SPINE - COMPLETE 4+ VIEW   COMPARISON:  None Available.   FINDINGS: On the lateral view the cervical spine is visualized to the level of C6-7 with improved visualization of C7-T1 on the swimmer's view. Straightening of the cervical spine. Pre-vertebral soft tissues are within normal limits. No fracture is detected in the cervical spine. Dens is well positioned between the lateral masses of C1. Moderate multilevel degenerative disc disease in the mid to lower cervical spine, most prominent at C6-7. No cervical spine subluxation. Mild bilateral cervical facet arthropathy. Suggestion of mild degenerative foraminal stenosis on the left at C6-7. No aggressive-appearing focal osseous lesions.   IMPRESSION: 1. Moderate multilevel degenerative disc disease in the mid to lower cervical spine, most prominent at C6-7. 2. Mild bilateral cervical facet arthropathy. 3. Suggestion of mild degenerative foraminal stenosis on the left at C6-7.     Electronically Signed   By: Delbert Phenix M.D.   On: 05/30/2022 12:19  PATIENT SURVEYS:  FOTO deferred to next session   Quick DASH: COGNITION: Overall cognitive status: Within functional limits for tasks assessed  SENSATION: WFL  POSTURE: rounded shoulders, forward head, and increased thoracic kyphosis  PALPATION: TTP along C2-T2 along spinous processes, L cervical paraspinals, upper trap.   CERVICAL ROM:   Active ROM A/PROM (deg) eval  Flexion 55  Extension 25*  Right lateral flexion 37  Left lateral flexion 37  Right rotation 55  Left rotation 50   (Blank rows = not tested)  UPPER EXTREMITY ROM:  Active ROM Right eval Left eval  Shoulder flexion WNL WNL  Shoulder extension    Shoulder abduction WNL WNL  Shoulder adduction    Shoulder extension    Shoulder internal rotation    Shoulder external  rotation    Elbow flexion    Elbow extension    Wrist flexion    Wrist extension    Wrist ulnar deviation    Wrist radial deviation    Wrist pronation    Wrist supination     (Blank rows = not tested)  UPPER EXTREMITY MMT:  MMT Right eval Left eval  Shoulder flexion 5 5  Shoulder extension    Shoulder abduction 5 5  Shoulder adduction    Shoulder extension    Shoulder internal rotation 5 5  Shoulder external rotation 5 5  Middle trapezius    Lower trapezius    Elbow flexion 5 5  Elbow extension 5 5  Wrist flexion 5 4  Wrist extension    Wrist ulnar deviation    Wrist radial deviation    Wrist pronation    Wrist supination    Grip strength Symmetrical  Symmetrical   (Blank rows = not tested)  CERVICAL SPECIAL TESTS:  Upper limb tension test (ULTT): Positive, Spurling's test: Negative, and Distraction test: Negative  Radial specific ULTT. Ulnar and median nerve negative  JOINT MOBILITY:   Cervical CPA's PA hypomobile and concordant symptoms C2-T2. Concordant N/T in L hand/fingers with approximate C6-C7 CPA's.   TODAY'S TREATMENT:                                                                                                                              DATE: 08/06/22  There.ex:  UBE: L5 for 2 min forward, 2 min backward for warm up   Discussed sleeping posture for neutral cervical alignment to prevent foraminal joint space closing likely to reproduce LUE cervical radiculopathy symptoms.   Seated cervical SNAGS:   Rotation R/L: x12/side   Seated B AAROM shoulder flexion with dowel and 10# bar for thoracic extension: 3x12  Standing/seated TB exercises added to HEP:   Scap retractions, black TB: 2x12  T rows, Black TB seated: 2x12  B shoulder extension/ low row blue TB: 2x12  Seated Cervical retractions into green ball. 3x15   PATIENT EDUCATION:  Education details: POC, prognosis, HEP Person educated: Patient Education method: Explanation, Demonstration,  and Handouts Education comprehension: verbalized understanding, returned demonstration, and needs further education  HOME EXERCISE PROGRAM: Access Code: [email or web address] URL: [email or web address] Date: 07/30/2022 Prepared by: Ronnie Derby  Exercises - Seated Cervical Retraction  - 1 x daily - 7 x weekly - 2 sets - 15 reps - Seated Upper Trapezius Stretch  - 1 x daily - 7 x weekly - 1 sets - 3 reps - Seated Assisted Cervical Rotation with Towel  - 1 x daily - 7 x weekly - 2 sets - 12 reps - 2-3 hold - Cervical Extension AROM with Strap  - 1 x daily - 7 x weekly - 2 sets - 12 reps - 2-3 hold - Scapular Retraction with Resistance  - 1 x daily - 3-4 x weekly - 3 sets - 8 reps - Scapular Retraction with Resistance Advanced  - 1 x daily - 3-4 x weekly - 3 sets - 8 reps - Standing Row with Resistance with Anchored Resistance at Chest Height Palms Down  - 1 x daily - 3-4 x weekly - 3 sets - 8 reps   Access Code:  [email or web address] URL: [email or web address] Date: 07/19/2022 Prepared by: Ronnie Derby  Exercises - Seated Cervical Retraction  - 1 x daily - 7 x weekly - 2 sets - 15 reps - Seated Upper Trapezius Stretch  - 1 x daily - 7 x weekly - 1 sets - 3 reps - Seated Assisted Cervical Rotation with Towel  - 1 x daily - 7 x weekly - 2 sets - 12 reps - 2-3 hold - Cervical Extension AROM with Strap  - 1 x daily - 7 x weekly - 2 sets - 12 reps - 2-3 hold   Access Code: [email or web address] URL: [email or web address] Date: 07/15/2022 Prepared by: Ronnie Derby  Exercises - Seated Cervical Retraction  - 1 x daily - 7 x weekly - 2 sets - 15 reps - Seated Upper Trapezius Stretch  - 1 x daily - 7 x weekly - 2 sets - 15 reps - Seated Cervical Rotation AROM  - 1 x daily - 7 x weekly - 2 sets - 15 reps  ASSESSMENT:  CLINICAL IMPRESSION: Continuing PT POC progressing periscapular strength for posture. Pt with good understanding of updated HEP exercises. Discussion on possibly  discharging near 10th visit as pt has made vast improvements in pain and N/T symptoms in his LUE. Educated on importance of regular cervical mobility and postural strengthening intermittently as pt still has pain with prolonged standing likely due to his mild forward head and kyphotic posture. Pt will continue to benefit from skilled PT to address postural deficits and strength training to prevent future cervical pain and improve QoL.  OBJECTIVE IMPAIRMENTS: decreased ROM, decreased strength, hypomobility, impaired flexibility, impaired sensation, impaired UE functional use, improper body mechanics, postural dysfunction, and pain.   ACTIVITY LIMITATIONS: lifting, standing, and sleeping  PARTICIPATION LIMITATIONS: community activity and occupation  PERSONAL FACTORS: Age, Past/current experiences, Profession, Time since onset of injury/illness/exacerbation, and 3+ comorbidities: HLD, HTN, systemic lupus erythematosus, hx of DVT, hx of knee and cervical OA, aortic atherosclerosis  are also affecting patient's functional outcome.   REHAB POTENTIAL: Good  CLINICAL DECISION MAKING: Evolving/moderate complexity  EVALUATION COMPLEXITY: Moderate   GOALS: Goals reviewed with patient? No  SHORT TERM GOALS: Target date: 08/11/22  Pt will be independent with HEP to improve cervical AROM and radicular pain to return to PLOF. Baseline: 07/14/22: given initial HEP Goal status: INITIAL  LONG TERM GOALS: Target date: 09/08/22  Pt will improve FOTO to target score to demonstrate clinically significant improvement in functional mobility.  Baseline: 07/14/22: deferred to next session; 07/16/22: 56 with target score of 67 Goal status: INITIAL  2.  Pt will improve cervical extension and rotation AROM by at least 7 degrees in these planes for clinically significant improvement in mobility for overhead and rotational ADL's. Baseline: 07/14/22:   Active ROM A/PROM (deg) eval  Flexion 55  Extension 25*  Right  lateral flexion 37  Left lateral flexion 37  Right rotation 55  Left rotation 50   Goal status: INITIAL  3.  Pt will report ability to stand at work for > 1.5 hours with no more than 4/10 pain in NPS to demonstrate clinically significant improvement in pain with standing work tasks.  Baseline: 07/14/22: Onset of 6/10 NPS cervical and LUE pain with standing for 1.5 hours or greater.  Goal status: INITIAL  PLAN:  PT FREQUENCY: 1-2x/week  PT DURATION: 8 weeks  PLANNED INTERVENTIONS: Therapeutic exercises, Therapeutic activity, Neuromuscular re-education, Balance training, Gait training, Patient/Family education, Self Care, Joint mobilization, Joint manipulation, Dry Needling, Electrical  stimulation, Spinal mobilization, Cryotherapy, Moist heat, Manual therapy, and Re-evaluation  PLAN FOR NEXT SESSION: cervical AROM, thoracic extension/periscap strengthening   Delphia Grates. Fairly IV, PT, DPT Physical Therapist- [facility]  08/06/2022, 12:03 PM

## 2022-08-08 NOTE — Patient Instructions (Signed)
Neck Exercises Ask your health care provider which exercises are safe for you. Do exercises exactly as told by your health care provider and adjust them as directed. It is normal to feel mild stretching, pulling, tightness, or discomfort as you do these exercises. Stop right away if you feel sudden pain or your pain gets worse. Do not begin these exercises until told by your health care provider. Neck exercises can be important for many reasons. They can improve strength and maintain flexibility in your neck, which will help your upper back and prevent neck pain. Stretching exercises Rotation neck stretching  Sit in a chair or stand up. Place your feet flat on the floor, shoulder-width apart. Slowly turn your head (rotate) to the right until a slight stretch is felt. Turn it all the way to the right so you can look over your right shoulder. Do not tilt or tip your head. Hold this position for 10-30 seconds. Slowly turn your head (rotate) to the left until a slight stretch is felt. Turn it all the way to the left so you can look over your left shoulder. Do not tilt or tip your head. Hold this position for 10-30 seconds. Repeat __________ times. Complete this exercise __________ times a day. Neck retraction  Sit in a sturdy chair or stand up. Look straight ahead. Do not bend your neck. Use your fingers to push your chin backward (retraction). Do not bend your neck for this movement. Continue to face straight ahead. If you are doing the exercise properly, you will feel a slight sensation in your throat and a stretch at the back of your neck. Hold the stretch for 1-2 seconds. Repeat __________ times. Complete this exercise __________ times a day. Strengthening exercises Neck press  Lie on your back on a firm bed or on the floor with a pillow under your head. Use your neck muscles to push your head down on the pillow and straighten your spine. Hold the position as well as you can. Keep your head  facing up (in a neutral position) and your chin tucked. Slowly count to 5 while holding this position. Repeat __________ times. Complete this exercise __________ times a day. Isometrics These are exercises in which you strengthen the muscles in your neck while keeping your neck still (isometrics). Sit in a supportive chair and place your hand on your forehead. Keep your head and face facing straight ahead. Do not flex or extend your neck while doing isometrics. Push forward with your head and neck while pushing back with your hand. Hold for 10 seconds. Do the sequence again, this time putting your hand against the back of your head. Use your head and neck to push backward against the hand pressure. Finally, do the same exercise on either side of your head, pushing sideways against the pressure of your hand. Repeat __________ times. Complete this exercise __________ times a day. Prone head lifts  Lie face-down (prone position), resting on your elbows so that your chest and upper back are raised. Start with your head facing downward, near your chest. Position your chin either on or near your chest. Slowly lift your head upward. Lift until you are looking straight ahead. Then continue lifting your head as far back as you can comfortably stretch. Hold your head up for 5 seconds. Then slowly lower it to your starting position. Repeat __________ times. Complete this exercise __________ times a day. Supine head lifts  Lie on your back (supine position), bending your knees   to point to the ceiling and keeping your feet flat on the floor. Lift your head slowly off the floor, raising your chin toward your chest. Hold for 5 seconds. Repeat __________ times. Complete this exercise __________ times a day. Scapular retraction  Stand with your arms at your sides. Look straight ahead. Slowly pull both shoulders (scapulae) backward and downward (retraction) until you feel a stretch between your shoulder  blades in your upper back. Hold for 10-30 seconds. Relax and repeat. Repeat __________ times. Complete this exercise __________ times a day. Contact a health care provider if: Your neck pain or discomfort gets worse when you do an exercise. Your neck pain or discomfort does not improve within 2 hours after you exercise. If you have any of these problems, stop exercising right away. Do not do the exercises again unless your health care provider says that you can. Get help right away if: You develop sudden, severe neck pain. If this happens, stop exercising right away. Do not do the exercises again unless your health care provider says that you can. This information is not intended to replace advice given to you by your health care provider. Make sure you discuss any questions you have with your health care provider. Document Revised: 07/15/2020 Document Reviewed: 07/15/2020 [employer] Patient Education  2024 [employer]  

## 2022-08-09 ENCOUNTER — Ambulatory Visit: Payer: No Typology Code available for payment source

## 2022-08-10 ENCOUNTER — Ambulatory Visit: Payer: No Typology Code available for payment source

## 2022-08-10 DIAGNOSIS — R293 Abnormal posture: Secondary | ICD-10-CM

## 2022-08-10 DIAGNOSIS — M542 Cervicalgia: Secondary | ICD-10-CM

## 2022-08-10 NOTE — Therapy (Unsigned)
OUTPATIENT PHYSICAL THERAPY CERVICAL TREATMENT   Patient Name: Dwayne Jacobson MRN: [id number] DOB:1955/07/13, 67 y.o., male Today's Date: 08/06/2022  END OF SESSION:  PT End of Session - 08/06/22 1120     Visit Number 7    Number of Visits 17    Date for PT Re-Evaluation 09/08/22    PT Start Time 1115    PT Stop Time 1159    PT Time Calculation (min) 44 min    Activity Tolerance Patient tolerated treatment well    Behavior During Therapy [facility] for tasks assessed/performed             Past Medical History:  Diagnosis Date   DVT (deep venous thrombosis) (HCC)    Hyperlipidemia    Hypertension    Seizures (HCC)    Skin cancer    Systemic lupus erythematosus (HCC)    Past Surgical History:  Procedure Laterality Date   NO PAST SURGERIES     Patient Active Problem List   Diagnosis Date Noted   Emphysema lung (HCC) 07/06/2022   Aortic atherosclerosis (HCC) 07/06/2022   Degeneration of intervertebral disc of cervical spine without disc herniation 05/30/2022   Erectile dysfunction 03/26/2022   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   Current every day nicotine vaping 10/31/2020   Healthcare maintenance 09/19/2020   Gastroesophageal reflux disease without esophagitis 08/17/2019   Chondrocalcinosis 01/13/2017   Primary osteoarthritis of both knees 01/13/2017   History of DVT (deep vein thrombosis)    Hypertension    Hyperlipidemia    SLE (systemic lupus erythematosus) (HCC) 01/18/2014    PCP: Marjie Skiff, NPT  REFERRING PROVIDER: Aura Dials T, NPT  REFERRING DIAG: M50.30 (ICD-10-CM) - Degeneration of intervertebral disc of cervical spine without disc herniation  THERAPY DIAG:  Cervicalgia  Abnormal posture  Rationale for Evaluation and Treatment: Rehabilitation  ONSET DATE: Early April 2024  SUBJECTIVE:                                                                                                                                                                                                          SUBJECTIVE STATEMENT: Pt reports no pain. Continues to report improvement in his symptoms.   Hand dominance: Left  PERTINENT HISTORY:  Pt is a pleasant 67 y.o. male referred to OPPT for cervical radiculopathy. Pt reports gradual onset of L sided neck pain radiating down L arm leading to N/T into his hand and fingers. Began with difficulty sleeping and just neck pain. Pain described as dull/achey in the neck down the L arm on  posterior aspect with dorsal and palmar side of hands/fingers with N/T. Reports N/T as mild to moderate. Not leading to clumsiness and dropping items. Worst pain 6/10 NPS, Best pain on pain meds is 3/10 NPS. Current pain 6/10 NPS. Aggravating factors include: sleeping supine, cervical extension. Relieving factor include: UE elevation over head, cervical flexion, and pain meds. Pt works Public librarian retirement. Reports impact at work tasks such as standing work tasks. Denies B/B symptoms, s/s of fever, saddle anesthesia, coordination or balance impairment.   From referring provider:  for ongoing neck pain for 5 weeks, imaging on x-ray noting imaging noting moderate multilevel degenerative disc disease in the mid to lower cervical spine, prominent at C6-C7. Having some radiculopathy left arm.  PAIN:  Are you having pain? Yes: NPRS scale: 0/10 Pain location: L cervical spine, paraspinals, shoulder and posterior UE Pain description: aching/dull Aggravating factors: cervical extension, sleeping supine Relieving factors: LUE overhead, cervical flexion  PRECAUTIONS: None  WEIGHT BEARING RESTRICTIONS: No  FALLS:  Has patient fallen in last 6 months? No  LIVING ENVIRONMENT: Lives with: lives with their spouse  OCCUPATION: Holiday representative  PLOF: Independent  PATIENT GOALS: Improve pain  NEXT MD VISIT: July 2024  OBJECTIVE:   DIAGNOSTIC FINDINGS:  CLINICAL DATA:  Posterior left neck pain for 3 weeks radiating  to the left shoulder and upper extremity with left hand numbness intermittently. No reported injury.   EXAM: CERVICAL SPINE - COMPLETE 4+ VIEW   COMPARISON:  None Available.   FINDINGS: On the lateral view the cervical spine is visualized to the level of C6-7 with improved visualization of C7-T1 on the swimmer's view. Straightening of the cervical spine. Pre-vertebral soft tissues are within normal limits. No fracture is detected in the cervical spine. Dens is well positioned between the lateral masses of C1. Moderate multilevel degenerative disc disease in the mid to lower cervical spine, most prominent at C6-7. No cervical spine subluxation. Mild bilateral cervical facet arthropathy. Suggestion of mild degenerative foraminal stenosis on the left at C6-7. No aggressive-appearing focal osseous lesions.   IMPRESSION: 1. Moderate multilevel degenerative disc disease in the mid to lower cervical spine, most prominent at C6-7. 2. Mild bilateral cervical facet arthropathy. 3. Suggestion of mild degenerative foraminal stenosis on the left at C6-7.     Electronically Signed   By: Delbert Phenix M.D.   On: 05/30/2022 12:19  PATIENT SURVEYS:  FOTO deferred to next session   Quick DASH: COGNITION: Overall cognitive status: Within functional limits for tasks assessed  SENSATION: WFL  POSTURE: rounded shoulders, forward head, and increased thoracic kyphosis  PALPATION: TTP along C2-T2 along spinous processes, L cervical paraspinals, upper trap.   CERVICAL ROM:   Active ROM A/PROM (deg) eval  Flexion 55  Extension 25*  Right lateral flexion 37  Left lateral flexion 37  Right rotation 55  Left rotation 50   (Blank rows = not tested)  UPPER EXTREMITY ROM:  Active ROM Right eval Left eval  Shoulder flexion WNL WNL  Shoulder extension    Shoulder abduction WNL WNL  Shoulder adduction    Shoulder extension    Shoulder internal rotation    Shoulder external rotation     Elbow flexion    Elbow extension    Wrist flexion    Wrist extension    Wrist ulnar deviation    Wrist radial deviation    Wrist pronation    Wrist supination     (Blank rows = not tested)  UPPER EXTREMITY  MMT:  MMT Right eval Left eval  Shoulder flexion 5 5  Shoulder extension    Shoulder abduction 5 5  Shoulder adduction    Shoulder extension    Shoulder internal rotation 5 5  Shoulder external rotation 5 5  Middle trapezius    Lower trapezius    Elbow flexion 5 5  Elbow extension 5 5  Wrist flexion 5 4  Wrist extension    Wrist ulnar deviation    Wrist radial deviation    Wrist pronation    Wrist supination    Grip strength Symmetrical  Symmetrical   (Blank rows = not tested)  CERVICAL SPECIAL TESTS:  Upper limb tension test (ULTT): Positive, Spurling's test: Negative, and Distraction test: Negative  Radial specific ULTT. Ulnar and median nerve negative  JOINT MOBILITY:   Cervical CPA's PA hypomobile and concordant symptoms C2-T2. Concordant N/T in L hand/fingers with approximate C6-C7 CPA's.   TODAY'S TREATMENT:                                                                                                                              DATE: 08/10/22  There.ex: UBE: L6 for 3 min forward, 3 min backward for warm up   Cervical AROM prior to session:   Flexion: 55  Extension: 35   Rotation R/L: 40/42  Lateral flexion R/L: 55/55  Seated cervical SNAGS:   Rotation R/L: x12/side   Extension: x12  Seated B AAROM shoulder flexion with dowel and 10# bar for thoracic extension: 3x12  Standing TB exercises:   Scap retractions, black TB: 2x12  T rows, Black TB seated: 2x12  B shoulder extension/ low row black TB: 2x12  Seated B shoulder ER with blue TB + scap retraction: 2x12  Seated Cervical retractions into green ball. 3x15   PATIENT EDUCATION:  Education details: POC, prognosis, HEP Person educated: Patient Education method: Explanation,  Demonstration, and Handouts Education comprehension: verbalized understanding, returned demonstration, and needs further education  HOME EXERCISE PROGRAM: Access Code: [email or web address] URL: [email or web address] Date: 07/30/2022 Prepared by: Ronnie Derby  Exercises - Seated Cervical Retraction  - 1 x daily - 7 x weekly - 2 sets - 15 reps - Seated Upper Trapezius Stretch  - 1 x daily - 7 x weekly - 1 sets - 3 reps - Seated Assisted Cervical Rotation with Towel  - 1 x daily - 7 x weekly - 2 sets - 12 reps - 2-3 hold - Cervical Extension AROM with Strap  - 1 x daily - 7 x weekly - 2 sets - 12 reps - 2-3 hold - Scapular Retraction with Resistance  - 1 x daily - 3-4 x weekly - 3 sets - 8 reps - Scapular Retraction with Resistance Advanced  - 1 x daily - 3-4 x weekly - 3 sets - 8 reps - Standing Row with Resistance with Anchored Resistance at Chest Height Palms Down  - 1 x daily - 3-4 x weekly -  3 sets - 8 reps   Access Code: [email or web address] URL: [email or web address] Date: 07/19/2022 Prepared by: Ronnie Derby  Exercises - Seated Cervical Retraction  - 1 x daily - 7 x weekly - 2 sets - 15 reps - Seated Upper Trapezius Stretch  - 1 x daily - 7 x weekly - 1 sets - 3 reps - Seated Assisted Cervical Rotation with Towel  - 1 x daily - 7 x weekly - 2 sets - 12 reps - 2-3 hold - Cervical Extension AROM with Strap  - 1 x daily - 7 x weekly - 2 sets - 12 reps - 2-3 hold   Access Code: [email or web address] URL: [email or web address] Date: 07/15/2022 Prepared by: Ronnie Derby  Exercises - Seated Cervical Retraction  - 1 x daily - 7 x weekly - 2 sets - 15 reps - Seated Upper Trapezius Stretch  - 1 x daily - 7 x weekly - 2 sets - 15 reps - Seated Cervical Rotation AROM  - 1 x daily - 7 x weekly - 2 sets - 15 reps  ASSESSMENT:  CLINICAL IMPRESSION: Continuing PT POC progressing periscapular strength for posture. Check in on cervical AROM performed. Pt appears to have  plateaued with cervical rotation but has made steady progress with side bending and cervical extension with minimal pain at end range with extension. Discussion on likely d/c on 10th visit as pt has made excellent progress with pain and general cervical mobility and almost complete resolution of N/T into LUE into hand. Continuing exercises in PT that pt can perform at home as part of HEP as pt does not have gym equipment or access to a gym. Pt with excellent understanding of HEP and postural exercises. Pt will continue to benefit from skilled PT to address postural deficits and strength training to prevent future cervical pain and improve QoL.  OBJECTIVE IMPAIRMENTS: decreased ROM, decreased strength, hypomobility, impaired flexibility, impaired sensation, impaired UE functional use, improper body mechanics, postural dysfunction, and pain.   ACTIVITY LIMITATIONS: lifting, standing, and sleeping  PARTICIPATION LIMITATIONS: community activity and occupation  PERSONAL FACTORS: Age, Past/current experiences, Profession, Time since onset of injury/illness/exacerbation, and 3+ comorbidities: HLD, HTN, systemic lupus erythematosus, hx of DVT, hx of knee and cervical OA, aortic atherosclerosis  are also affecting patient's functional outcome.   REHAB POTENTIAL: Good  CLINICAL DECISION MAKING: Evolving/moderate complexity  EVALUATION COMPLEXITY: Moderate   GOALS: Goals reviewed with patient? No  SHORT TERM GOALS: Target date: 08/11/22  Pt will be independent with HEP to improve cervical AROM and radicular pain to return to PLOF. Baseline: 07/14/22: given initial HEP; 08/10/22: 10)% compliance Goal status: MET  LONG TERM GOALS: Target date: 09/08/22  Pt will improve FOTO to target score to demonstrate clinically significant improvement in functional mobility.  Baseline: 07/14/22: deferred to next session; 07/16/22: 56 with target score of 67 Goal status: INITIAL  2.  Pt will improve cervical extension  and rotation AROM by at least 7 degrees in these planes for clinically significant improvement in mobility for overhead and rotational ADL's. Baseline: 07/14/22:   Active ROM A/PROM (deg) eval  Flexion 55  Extension 25*  Right lateral flexion 37  Left lateral flexion 37  Right rotation 55  Left rotation 50   Goal status: INITIAL  3.  Pt will report ability to stand at work for > 1.5 hours with no more than 4/10 pain in NPS to demonstrate clinically significant improvement in pain with standing work tasks.  Baseline: 07/14/22: Onset of 6/10  NPS cervical and LUE pain with standing for 1.5 hours or greater.  Goal status: INITIAL  PLAN:  PT FREQUENCY: 1-2x/week  PT DURATION: 8 weeks  PLANNED INTERVENTIONS: Therapeutic exercises, Therapeutic activity, Neuromuscular re-education, Balance training, Gait training, Patient/Family education, Self Care, Joint mobilization, Joint manipulation, Dry Needling, Electrical stimulation, Spinal mobilization, Cryotherapy, Moist heat, Manual therapy, and Re-evaluation  PLAN FOR NEXT SESSION: Begin formulating robust home HEP in prep for d/c by visit 10.   Delphia Grates. Fairly IV, PT, DPT Physical Therapist- [facility]  08/06/2022, 12:03 PM

## 2022-08-12 ENCOUNTER — Ambulatory Visit: Payer: No Typology Code available for payment source

## 2022-08-12 DIAGNOSIS — R293 Abnormal posture: Secondary | ICD-10-CM

## 2022-08-12 DIAGNOSIS — M542 Cervicalgia: Secondary | ICD-10-CM | POA: Diagnosis not present

## 2022-08-12 NOTE — Therapy (Signed)
OUTPATIENT PHYSICAL THERAPY CERVICAL TREATMENT   Patient Name: TEVITA GOMER MRN: [id number] DOB:1955-04-25, 67 y.o., male Today's Date: 08/12/2022  END OF SESSION:  PT End of Session - 08/12/22 1504     Visit Number 9    Number of Visits 17    Date for PT Re-Evaluation 09/08/22    PT Start Time 1510    PT Stop Time 1552    PT Time Calculation (min) 42 min    Activity Tolerance Patient tolerated treatment well    Behavior During Therapy WFL for tasks assessed/performed             Past Medical History:  Diagnosis Date   DVT (deep venous thrombosis) (HCC)    Hyperlipidemia    Hypertension    Seizures (HCC)    Skin cancer    Systemic lupus erythematosus (HCC)    Past Surgical History:  Procedure Laterality Date   NO PAST SURGERIES     Patient Active Problem List   Diagnosis Date Noted   Emphysema lung (HCC) 07/06/2022   Aortic atherosclerosis (HCC) 07/06/2022   Degeneration of intervertebral disc of cervical spine without disc herniation 05/30/2022   Erectile dysfunction 03/26/2022   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   Current every day nicotine vaping 10/31/2020   Healthcare maintenance 09/19/2020   Gastroesophageal reflux disease without esophagitis 08/17/2019   Chondrocalcinosis 01/13/2017   Primary osteoarthritis of both knees 01/13/2017   History of DVT (deep vein thrombosis)    Hypertension    Hyperlipidemia    SLE (systemic lupus erythematosus) (HCC) 01/18/2014    PCP: Marjie Skiff, NPT  REFERRING PROVIDER: Aura Dials T, NPT  REFERRING DIAG: M50.30 (ICD-10-CM) - Degeneration of intervertebral disc of cervical spine without disc herniation  THERAPY DIAG:  Cervicalgia  Abnormal posture  Rationale for Evaluation and Treatment: Rehabilitation  ONSET DATE: Early April 2024  SUBJECTIVE:                                                                                                                                                                                                          SUBJECTIVE STATEMENT: Pt reports 3/10 pain in cervical spine. Pt believes he may have slept wrong. Mild N/T in LUE however this has improved as the day has gone on.   Hand dominance: Left  PERTINENT HISTORY:  Pt is a pleasant 67 y.o. male referred to OPPT for cervical radiculopathy. Pt reports gradual onset of L sided neck pain radiating down L arm leading to N/T into his hand and fingers. Began with difficulty  sleeping and just neck pain. Pain described as dull/achey in the neck down the L arm on posterior aspect with dorsal and palmar side of hands/fingers with N/T. Reports N/T as mild to moderate. Not leading to clumsiness and dropping items. Worst pain 6/10 NPS, Best pain on pain meds is 3/10 NPS. Current pain 6/10 NPS. Aggravating factors include: sleeping supine, cervical extension. Relieving factor include: UE elevation over head, cervical flexion, and pain meds. Pt works Public librarian retirement. Reports impact at work tasks such as standing work tasks. Denies B/B symptoms, s/s of fever, saddle anesthesia, coordination or balance impairment.   From referring provider:  for ongoing neck pain for 5 weeks, imaging on x-ray noting imaging noting moderate multilevel degenerative disc disease in the mid to lower cervical spine, prominent at C6-C7. Having some radiculopathy left arm.  PAIN:  Are you having pain? Yes: NPRS scale: 3/10 Pain location: L cervical spine, paraspinals, shoulder and posterior UE Pain description: aching/dull Aggravating factors: cervical extension, sleeping supine Relieving factors: LUE overhead, cervical flexion  PRECAUTIONS: None  WEIGHT BEARING RESTRICTIONS: No  FALLS:  Has patient fallen in last 6 months? No  LIVING ENVIRONMENT: Lives with: lives with their spouse  OCCUPATION: Holiday representative  PLOF: Independent  PATIENT GOALS: Improve pain  NEXT MD VISIT: July 2024  OBJECTIVE:   DIAGNOSTIC  FINDINGS:  CLINICAL DATA:  Posterior left neck pain for 3 weeks radiating to the left shoulder and upper extremity with left hand numbness intermittently. No reported injury.   EXAM: CERVICAL SPINE - COMPLETE 4+ VIEW   COMPARISON:  None Available.   FINDINGS: On the lateral view the cervical spine is visualized to the level of C6-7 with improved visualization of C7-T1 on the swimmer's view. Straightening of the cervical spine. Pre-vertebral soft tissues are within normal limits. No fracture is detected in the cervical spine. Dens is well positioned between the lateral masses of C1. Moderate multilevel degenerative disc disease in the mid to lower cervical spine, most prominent at C6-7. No cervical spine subluxation. Mild bilateral cervical facet arthropathy. Suggestion of mild degenerative foraminal stenosis on the left at C6-7. No aggressive-appearing focal osseous lesions.   IMPRESSION: 1. Moderate multilevel degenerative disc disease in the mid to lower cervical spine, most prominent at C6-7. 2. Mild bilateral cervical facet arthropathy. 3. Suggestion of mild degenerative foraminal stenosis on the left at C6-7.     Electronically Signed   By: Delbert Phenix M.D.   On: 05/30/2022 12:19  PATIENT SURVEYS:  FOTO deferred to next session   Quick DASH: COGNITION: Overall cognitive status: Within functional limits for tasks assessed  SENSATION: WFL  POSTURE: rounded shoulders, forward head, and increased thoracic kyphosis  PALPATION: TTP along C2-T2 along spinous processes, L cervical paraspinals, upper trap.   CERVICAL ROM:   Active ROM A/PROM (deg) eval  Flexion 55  Extension 25*  Right lateral flexion 37  Left lateral flexion 37  Right rotation 55  Left rotation 50   (Blank rows = not tested)  UPPER EXTREMITY ROM:  Active ROM Right eval Left eval  Shoulder flexion WNL WNL  Shoulder extension    Shoulder abduction WNL WNL  Shoulder adduction     Shoulder extension    Shoulder internal rotation    Shoulder external rotation    Elbow flexion    Elbow extension    Wrist flexion    Wrist extension    Wrist ulnar deviation    Wrist radial deviation    Wrist pronation  Wrist supination     (Blank rows = not tested)  UPPER EXTREMITY MMT:  MMT Right eval Left eval  Shoulder flexion 5 5  Shoulder extension    Shoulder abduction 5 5  Shoulder adduction    Shoulder extension    Shoulder internal rotation 5 5  Shoulder external rotation 5 5  Middle trapezius    Lower trapezius    Elbow flexion 5 5  Elbow extension 5 5  Wrist flexion 5 4  Wrist extension    Wrist ulnar deviation    Wrist radial deviation    Wrist pronation    Wrist supination    Grip strength Symmetrical  Symmetrical   (Blank rows = not tested)  CERVICAL SPECIAL TESTS:  Upper limb tension test (ULTT): Positive, Spurling's test: Negative, and Distraction test: Negative  Radial specific ULTT. Ulnar and median nerve negative  JOINT MOBILITY:   Cervical CPA's PA hypomobile and concordant symptoms C2-T2. Concordant N/T in L hand/fingers with approximate C6-C7 CPA's.   TODAY'S TREATMENT:                                                                                                                              DATE: 08/12/22  There.ex: UBE: L6 for 3 min forward, 3 min backward for warm up   Seated thoracic extension over 1/2 bolster: 2x12  Seated upper trap stretch: 2x30 sec/side   Seated cervical retractions: x15  Seated cervical SNAGS:   Rotation: x12/side   Extension: x12  Side lying open books: 1x12/side  Seated B AAROM shoulder flexion with 10# bar for thoracic extension2x12   PATIENT EDUCATION:  Education details: POC, prognosis, HEP Person educated: Patient Education method: Programmer, multimedia, Demonstration, and Handouts Education comprehension: verbalized understanding, returned demonstration, and needs further education  HOME  EXERCISE PROGRAM: Access Code: [email or web address] URL: [email or web address] Date: 07/30/2022 Prepared by: Ronnie Derby  Exercises - Seated Cervical Retraction  - 1 x daily - 7 x weekly - 2 sets - 15 reps - Seated Upper Trapezius Stretch  - 1 x daily - 7 x weekly - 1 sets - 3 reps - Seated Assisted Cervical Rotation with Towel  - 1 x daily - 7 x weekly - 2 sets - 12 reps - 2-3 hold - Cervical Extension AROM with Strap  - 1 x daily - 7 x weekly - 2 sets - 12 reps - 2-3 hold - Scapular Retraction with Resistance  - 1 x daily - 3-4 x weekly - 3 sets - 8 reps - Scapular Retraction with Resistance Advanced  - 1 x daily - 3-4 x weekly - 3 sets - 8 reps - Standing Row with Resistance with Anchored Resistance at Chest Height Palms Down  - 1 x daily - 3-4 x weekly - 3 sets - 8 reps   Access Code: [email or web address] URL: [email or web address] Date: 07/19/2022 Prepared by: Ronnie Derby  Exercises - Seated Cervical Retraction  - 1 x daily - 7 x  weekly - 2 sets - 15 reps - Seated Upper Trapezius Stretch  - 1 x daily - 7 x weekly - 1 sets - 3 reps - Seated Assisted Cervical Rotation with Towel  - 1 x daily - 7 x weekly - 2 sets - 12 reps - 2-3 hold - Cervical Extension AROM with Strap  - 1 x daily - 7 x weekly - 2 sets - 12 reps - 2-3 hold   Access Code: [email or web address] URL: [email or web address] Date: 07/15/2022 Prepared by: Ronnie Derby  Exercises - Seated Cervical Retraction  - 1 x daily - 7 x weekly - 2 sets - 15 reps - Seated Upper Trapezius Stretch  - 1 x daily - 7 x weekly - 2 sets - 15 reps - Seated Cervical Rotation AROM  - 1 x daily - 7 x weekly - 2 sets - 15 reps  ASSESSMENT:  CLINICAL IMPRESSION: Regressed some of pt's exercises today due to mild pain exacerbation thought to be due to sleeping position. Focus of session on thoracic mobility to address thoracic kyphosis and cervical impairments and brief, periscapular strengthening today. Pt does have some  reproduction of LUE symptoms today with L side lying despite neutral cervical positioning and L shoulder padding with pillows. Pt reports ease of symptoms post session to 1.5/10 NPS. Pt still in agreement to plan for d/c next session. Pt will continue to benefit from skilled PT to address postural deficits and strength training to prevent future cervical pain and improve QoL.    OBJECTIVE IMPAIRMENTS: decreased ROM, decreased strength, hypomobility, impaired flexibility, impaired sensation, impaired UE functional use, improper body mechanics, postural dysfunction, and pain.   ACTIVITY LIMITATIONS: lifting, standing, and sleeping  PARTICIPATION LIMITATIONS: community activity and occupation  PERSONAL FACTORS: Age, Past/current experiences, Profession, Time since onset of injury/illness/exacerbation, and 3+ comorbidities: HLD, HTN, systemic lupus erythematosus, hx of DVT, hx of knee and cervical OA, aortic atherosclerosis  are also affecting patient's functional outcome.   REHAB POTENTIAL: Good  CLINICAL DECISION MAKING: Evolving/moderate complexity  EVALUATION COMPLEXITY: Moderate   GOALS: Goals reviewed with patient? No  SHORT TERM GOALS: Target date: 08/11/22  Pt will be independent with HEP to improve cervical AROM and radicular pain to return to PLOF. Baseline: 07/14/22: given initial HEP; 08/10/22: 100% compliance Goal status: MET  LONG TERM GOALS: Target date: 09/08/22  Pt will improve FOTO to target score to demonstrate clinically significant improvement in functional mobility.  Baseline: 07/14/22: deferred to next session; 07/16/22: 56 with target score of 67 Goal status: INITIAL  2.  Pt will improve cervical extension and rotation AROM by at least 7 degrees in these planes for clinically significant improvement in mobility for overhead and rotational ADL's. Baseline: 07/14/22:   Active ROM A/PROM (deg) eval  Flexion 55  Extension 25*  Right lateral flexion 37  Left lateral  flexion 37  Right rotation 55  Left rotation 50   Goal status: INITIAL  3.  Pt will report ability to stand at work for > 1.5 hours with no more than 4/10 pain in NPS to demonstrate clinically significant improvement in pain with standing work tasks.  Baseline: 07/14/22: Onset of 6/10 NPS cervical and LUE pain with standing for 1.5 hours or greater.  Goal status: INITIAL  PLAN:  PT FREQUENCY: 1-2x/week  PT DURATION: 8 weeks  PLANNED INTERVENTIONS: Therapeutic exercises, Therapeutic activity, Neuromuscular re-education, Balance training, Gait training, Patient/Family education, Self Care, Joint mobilization, Joint manipulation, Dry Needling, Electrical stimulation, Spinal mobilization, Cryotherapy, Moist  heat, Manual therapy, and Re-evaluation  PLAN FOR NEXT SESSION: Begin formulating robust home HEP in prep for d/c by visit 10.   Delphia Grates. Fairly IV, PT, DPT Physical Therapist- [place]  [facility]  08/12/2022, 4:10 PM

## 2022-08-13 ENCOUNTER — Encounter: Payer: Self-pay | Admitting: Nurse Practitioner

## 2022-08-13 ENCOUNTER — Ambulatory Visit: Payer: No Typology Code available for payment source | Admitting: Nurse Practitioner

## 2022-08-13 VITALS — BP 119/75 | HR 64 | Temp 98.0°F | Ht 70.98 in | Wt 204.3 lb

## 2022-08-13 DIAGNOSIS — M5030 Other cervical disc degeneration, unspecified cervical region: Secondary | ICD-10-CM

## 2022-08-13 NOTE — Assessment & Plan Note (Signed)
For >4 weeks, now improved.  No red flags. Imaging noted degenerative changes.  Continue PT.  Discussed gentle exercise to help with discomfort.  Continue Salon Pas patches.  If return of pain then return to clinic.

## 2022-08-13 NOTE — Progress Notes (Signed)
BP 119/75   Pulse 64   Temp 98 F (36.7 C) (Oral)   Ht 5' 10.98" (1.803 m)   Wt 204 lb 4.8 oz (92.7 kg)   SpO2 100%   BMI 28.51 kg/m    Subjective:    Patient ID: Dwayne Jacobson, male    DOB: 1955/08/20, 67 y.o.   MRN: [id number]  HPI: Dwayne Jacobson is a 67 y.o. male  Chief Complaint  Patient presents with   Neck Pain    Patient states that pain is much better, states pain score today is "0"   NECK PAIN  Follow-up for neck pain, initially seen 05/26/22 -- was started on Prednisone taper and Norco.  Returned for follow-up on 06/11/22 with no improvement and PT was ordered and he reports this has offered a lot of benefit.  No further pain.  He has sessions left at PT, but Tuesday will be last session -- will have plan for home exercises.    Imaging noting moderate multilevel degenerative disc disease in the mid to lower cervical spine, prominent at C6-C7.  Denies any recent heavy lifting.  Denies CP, SOB, or diaphoresis.    Treatments attempted: Salon Pas patch which helps slightly, rheumatologist gave muscle relaxer - no benefit.  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  Norco has offered some benefit. Relief with NSAIDs?:  No NSAIDs Taken Location:Left, L>R, and midline Duration:weeks Severity: 0/10 today Quality: dull and aching with burning Frequency: constant Radiation: none anymore, improved Aggravating factors: movement, laying, and bending Alleviating factors: PT improved greatly Weakness:  no Paresthesias / decreased sensation:  no Fevers:  no   Relevant past medical, surgical, family and social history reviewed and updated as indicated. Interim medical history since our last visit reviewed. Allergies and medications reviewed and updated.  Review of Systems  Constitutional:  Negative for activity change, diaphoresis, fatigue and fever.  Respiratory:  Negative for cough, chest tightness, shortness of breath and wheezing.   Cardiovascular:  Negative for chest pain,  palpitations and leg swelling.  Gastrointestinal: Negative.   Musculoskeletal:  Positive for neck pain.  Neurological: Negative.   Psychiatric/Behavioral: Negative.     Per HPI unless specifically indicated above     Objective:    BP 119/75   Pulse 64   Temp 98 F (36.7 C) (Oral)   Ht 5' 10.98" (1.803 m)   Wt 204 lb 4.8 oz (92.7 kg)   SpO2 100%   BMI 28.51 kg/m   Wt Readings from Last 3 Encounters:  08/13/22 204 lb 4.8 oz (92.7 kg)  07/07/22 205 lb 3.2 oz (93.1 kg)  06/11/22 203 lb (92.1 kg)    Physical Exam Vitals and nursing note reviewed.  Constitutional:      General: He is awake. He is not in acute distress.    Appearance: He is well-developed and well-groomed. He is not ill-appearing.  HENT:     Head: Normocephalic and atraumatic.     Right Ear: Hearing, ear canal and external ear normal. No drainage.     Left Ear: Hearing, ear canal and external ear normal. No drainage.     Nose: Nose normal.     Mouth/Throat:     Pharynx: Uvula midline.  Eyes:     General: Lids are normal.        Right eye: No discharge.        Left eye: No discharge.     Extraocular Movements: Extraocular movements intact.     Conjunctiva/sclera:  Conjunctivae normal.     Pupils: Pupils are equal, round, and reactive to light.     Visual Fields: Right eye visual fields normal and left eye visual fields normal.  Neck:     Thyroid: No thyromegaly.     Vascular: No carotid bruit or JVD.     Trachea: Trachea normal.  Cardiovascular:     Rate and Rhythm: Normal rate and regular rhythm.     Heart sounds: Normal heart sounds, S1 normal and S2 normal. No murmur heard.    No gallop.  Pulmonary:     Effort: Pulmonary effort is normal. No accessory muscle usage or respiratory distress.     Breath sounds: Normal breath sounds.  Abdominal:     General: Bowel sounds are normal.     Palpations: Abdomen is soft. There is no hepatomegaly or splenomegaly.     Tenderness: There is no abdominal  tenderness.  Musculoskeletal:     Cervical back: Neck supple. No edema, erythema or torticollis. No pain with movement, spinous process tenderness or muscular tenderness. Normal range of motion.     Right lower leg: No edema.     Left lower leg: No edema.  Lymphadenopathy:     Head:     Right side of head: No submental, submandibular, tonsillar, preauricular or posterior auricular adenopathy.     Left side of head: No submental, submandibular, tonsillar, preauricular or posterior auricular adenopathy.     Cervical: No cervical adenopathy.  Skin:    General: Skin is warm and dry.     Capillary Refill: Capillary refill takes less than 2 seconds.     Findings: No rash.  Neurological:     Mental Status: He is alert and oriented to person, place, and time.     Cranial Nerves: Cranial nerves 2-12 are intact.     Sensory: Sensation is intact.     Gait: Gait is intact.     Deep Tendon Reflexes: Reflexes are normal and symmetric.     Reflex Scores:      Brachioradialis reflexes are 2+ on the right side and 2+ on the left side.      Patellar reflexes are 2+ on the right side and 2+ on the left side. Psychiatric:        Attention and Perception: Attention normal.        Mood and Affect: Mood normal.        Speech: Speech normal.        Behavior: Behavior normal. Behavior is cooperative.        Thought Content: Thought content normal.        Cognition and Memory: Cognition normal.        Judgment: Judgment normal.    Results for orders placed or performed in visit on 05/07/22  Protein / creatinine ratio, urine  Result Value Ref Range   Creatinine, Urine 52 20 - 320 mg/dL   Protein/Creat Ratio NOTE 25 - 148 mg/g creat   Protein/Creatinine Ratio NOTE 0.025 - 0.148 mg/mg creat   Total Protein, Urine <4 (L) 5 - 25 mg/dL  CBC with Differential/Platelet  Result Value Ref Range   WBC 5.8 3.8 - 10.8 Thousand/uL   RBC 4.77 4.20 - 5.80 Million/uL   Hemoglobin 14.8 13.2 - 17.1 g/dL   HCT 16.1  09.6 - 04.5 %   MCV 91.6 80.0 - 100.0 fL   MCH 31.0 27.0 - 33.0 pg   MCHC 33.9 32.0 - 36.0 g/dL   RDW  12.5 11.0 - 15.0 %   Platelets 278 140 - 400 Thousand/uL   MPV 9.7 7.5 - 12.5 fL   Neutro Abs 3,799 1,500 - 7,800 cells/uL   Lymphs Abs 1,085 850 - 3,900 cells/uL   Absolute Monocytes 684 200 - 950 cells/uL   Eosinophils Absolute 180 15 - 500 cells/uL   Basophils Absolute 52 0 - 200 cells/uL   Neutrophils Relative % 65.5 %   Total Lymphocyte 18.7 %   Monocytes Relative 11.8 %   Eosinophils Relative 3.1 %   Basophils Relative 0.9 %  COMPLETE METABOLIC PANEL WITH GFR  Result Value Ref Range   Glucose, Bld 97 65 - 99 mg/dL   BUN 10 7 - 25 mg/dL   Creat 1.61 0.96 - 0.45 mg/dL   eGFR 95 > OR = 60 WU/JWJ/1.91Y7   BUN/Creatinine Ratio SEE NOTE: 6 - 22 (calc)   Sodium 136 135 - 146 mmol/L   Potassium 4.8 3.5 - 5.3 mmol/L   Chloride 98 98 - 110 mmol/L   CO2 28 20 - 32 mmol/L   Calcium 10.0 8.6 - 10.3 mg/dL   Total Protein 7.8 6.1 - 8.1 g/dL   Albumin 4.8 3.6 - 5.1 g/dL   Globulin 3.0 1.9 - 3.7 g/dL (calc)   AG Ratio 1.6 1.0 - 2.5 (calc)   Total Bilirubin 0.4 0.2 - 1.2 mg/dL   Alkaline phosphatase (APISO) 79 35 - 144 U/L   AST 25 10 - 35 U/L   ALT 25 9 - 46 U/L  Anti-DNA antibody, double-stranded  Result Value Ref Range   ds DNA Ab 4 IU/mL  C3 and C4  Result Value Ref Range   C3 Complement 136 82 - 185 mg/dL   C4 Complement 27 15 - 53 mg/dL  Sedimentation rate  Result Value Ref Range   Sed Rate 11 0 - 20 mm/h      Assessment & Plan:   Problem List Items Addressed This Visit       Musculoskeletal and Integument   Degeneration of intervertebral disc of cervical spine without disc herniation - Primary    For >4 weeks, now improved.  No red flags. Imaging noted degenerative changes.  Continue PT.  Discussed gentle exercise to help with discomfort.  Continue Salon Pas patches.  If return of pain then return to clinic.         Follow up plan: Return for as scheduled  --  should have physical schedule after 09/26/22.

## 2022-08-16 ENCOUNTER — Ambulatory Visit: Payer: No Typology Code available for payment source

## 2022-08-17 ENCOUNTER — Ambulatory Visit: Payer: No Typology Code available for payment source

## 2022-08-17 DIAGNOSIS — M542 Cervicalgia: Secondary | ICD-10-CM

## 2022-08-17 DIAGNOSIS — R293 Abnormal posture: Secondary | ICD-10-CM

## 2022-08-17 NOTE — Therapy (Signed)
OUTPATIENT PHYSICAL THERAPY CERVICAL DISCHARGE SUMMARY   Patient Name: Dwayne Jacobson MRN: [id number] DOB:1955/06/13, 67 y.o., male Today's Date: 08/17/2022  END OF SESSION:  PT End of Session - 08/17/22 1518     Visit Number 10    Number of Visits 17    Date for PT Re-Evaluation 09/08/22    PT Start Time 1515    PT Stop Time 1535    PT Time Calculation (min) 20 min    Activity Tolerance Patient tolerated treatment well    Behavior During Therapy [facility] for tasks assessed/performed             Past Medical History:  Diagnosis Date   DVT (deep venous thrombosis) (HCC)    Hyperlipidemia    Hypertension    Seizures (HCC)    Skin cancer    Systemic lupus erythematosus (HCC)    Past Surgical History:  Procedure Laterality Date   NO PAST SURGERIES     Patient Active Problem List   Diagnosis Date Noted   Emphysema lung (HCC) 07/06/2022   Aortic atherosclerosis (HCC) 07/06/2022   Degeneration of intervertebral disc of cervical spine without disc herniation 05/30/2022   Erectile dysfunction 03/26/2022   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   Current every day nicotine vaping 10/31/2020   Healthcare maintenance 09/19/2020   Gastroesophageal reflux disease without esophagitis 08/17/2019   Chondrocalcinosis 01/13/2017   Primary osteoarthritis of both knees 01/13/2017   History of DVT (deep vein thrombosis)    Hypertension    Hyperlipidemia    SLE (systemic lupus erythematosus) (HCC) 01/18/2014    PCP: Marjie Skiff, NPT  REFERRING PROVIDER: Aura Dials T, NPT  REFERRING DIAG: M50.30 (ICD-10-CM) - Degeneration of intervertebral disc of cervical spine without disc herniation  THERAPY DIAG:  Cervicalgia  Abnormal posture  Rationale for Evaluation and Treatment: Rehabilitation  ONSET DATE: Early April 2024  SUBJECTIVE:                                                                                                                                                                                                          SUBJECTIVE STATEMENT: Pt reports no pain currently. Agreeable to discharge from PT today.    Hand dominance: Left  PERTINENT HISTORY:  Pt is a pleasant 67 y.o. male referred to OPPT for cervical radiculopathy. Pt reports gradual onset of L sided neck pain radiating down L arm leading to N/T into his hand and fingers. Began with difficulty sleeping and just neck pain. Pain described as dull/achey in the neck down the L  arm on posterior aspect with dorsal and palmar side of hands/fingers with N/T. Reports N/T as mild to moderate. Not leading to clumsiness and dropping items. Worst pain 6/10 NPS, Best pain on pain meds is 3/10 NPS. Current pain 6/10 NPS. Aggravating factors include: sleeping supine, cervical extension. Relieving factor include: UE elevation over head, cervical flexion, and pain meds. Pt works Public librarian retirement. Reports impact at work tasks such as standing work tasks. Denies B/B symptoms, s/s of fever, saddle anesthesia, coordination or balance impairment.   From referring provider:  for ongoing neck pain for 5 weeks, imaging on x-ray noting imaging noting moderate multilevel degenerative disc disease in the mid to lower cervical spine, prominent at C6-C7. Having some radiculopathy left arm.  PAIN:  Are you having pain? Yes: NPRS scale: 0/10 Pain location: L cervical spine, paraspinals, shoulder and posterior UE Pain description: aching/dull Aggravating factors: cervical extension, sleeping supine Relieving factors: LUE overhead, cervical flexion  PRECAUTIONS: None  WEIGHT BEARING RESTRICTIONS: No  FALLS:  Has patient fallen in last 6 months? No  LIVING ENVIRONMENT: Lives with: lives with their spouse  OCCUPATION: Holiday representative  PLOF: Independent  PATIENT GOALS: Improve pain  NEXT MD VISIT: July 2024  OBJECTIVE:   DIAGNOSTIC FINDINGS:  CLINICAL DATA:  Posterior left neck pain for 3 weeks  radiating to the left shoulder and upper extremity with left hand numbness intermittently. No reported injury.   EXAM: CERVICAL SPINE - COMPLETE 4+ VIEW   COMPARISON:  None Available.   FINDINGS: On the lateral view the cervical spine is visualized to the level of C6-7 with improved visualization of C7-T1 on the swimmer's view. Straightening of the cervical spine. Pre-vertebral soft tissues are within normal limits. No fracture is detected in the cervical spine. Dens is well positioned between the lateral masses of C1. Moderate multilevel degenerative disc disease in the mid to lower cervical spine, most prominent at C6-7. No cervical spine subluxation. Mild bilateral cervical facet arthropathy. Suggestion of mild degenerative foraminal stenosis on the left at C6-7. No aggressive-appearing focal osseous lesions.   IMPRESSION: 1. Moderate multilevel degenerative disc disease in the mid to lower cervical spine, most prominent at C6-7. 2. Mild bilateral cervical facet arthropathy. 3. Suggestion of mild degenerative foraminal stenosis on the left at C6-7.     Electronically Signed   By: Delbert Phenix M.D.   On: 05/30/2022 12:19  PATIENT SURVEYS:  FOTO deferred to next session   Quick DASH: COGNITION: Overall cognitive status: Within functional limits for tasks assessed  SENSATION: WFL  POSTURE: rounded shoulders, forward head, and increased thoracic kyphosis  PALPATION: TTP along C2-T2 along spinous processes, L cervical paraspinals, upper trap.   CERVICAL ROM:   Active ROM A/PROM (deg) eval  Flexion 55  Extension 25*  Right lateral flexion 37  Left lateral flexion 37  Right rotation 55  Left rotation 50   (Blank rows = not tested)  UPPER EXTREMITY ROM:  Active ROM Right eval Left eval  Shoulder flexion WNL WNL  Shoulder extension    Shoulder abduction WNL WNL  Shoulder adduction    Shoulder extension    Shoulder internal rotation    Shoulder external  rotation    Elbow flexion    Elbow extension    Wrist flexion    Wrist extension    Wrist ulnar deviation    Wrist radial deviation    Wrist pronation    Wrist supination     (Blank rows = not tested)  UPPER EXTREMITY MMT:  MMT Right eval Left eval  Shoulder flexion 5 5  Shoulder extension    Shoulder abduction 5 5  Shoulder adduction    Shoulder extension    Shoulder internal rotation 5 5  Shoulder external rotation 5 5  Middle trapezius    Lower trapezius    Elbow flexion 5 5  Elbow extension 5 5  Wrist flexion 5 4  Wrist extension    Wrist ulnar deviation    Wrist radial deviation    Wrist pronation    Wrist supination    Grip strength Symmetrical  Symmetrical   (Blank rows = not tested)  CERVICAL SPECIAL TESTS:  Upper limb tension test (ULTT): Positive, Spurling's test: Negative, and Distraction test: Negative  Radial specific ULTT. Ulnar and median nerve negative  JOINT MOBILITY:   Cervical CPA's PA hypomobile and concordant symptoms C2-T2. Concordant N/T in L hand/fingers with approximate C6-C7 CPA's.   TODAY'S TREATMENT:                                                                                                                              DATE: 08/17/22  There.ex: UBE: L6 for 3 min forward, 3 min backward for warm up   Review of long term goals. See clinical impression and goals section for details.   Reviewed updated HEP. Pt able to perform 1 set of seated thoracic extension with good form/technique. Discussed general progressions of exercise and frequency/intensity after d/c from PT. Pt understanding of education.      Seated thoracic extension over 1/2 bolster: 1x12  PATIENT EDUCATION:  Education details: POC, prognosis, HEP Person educated: Patient Education method: Explanation, Demonstration, and Handouts Education comprehension: verbalized understanding, returned demonstration, and needs further education  HOME EXERCISE  PROGRAM: Access Code: [email or web address] URL: [email or web address] Date: 08/17/2022 Prepared by: Ronnie Derby  Exercises - Seated Cervical Retraction  - 1 x daily - 7 x weekly - 2 sets - 15 reps - Seated Upper Trapezius Stretch  - 1 x daily - 7 x weekly - 1 sets - 3 reps - Seated Assisted Cervical Rotation with Towel  - 1 x daily - 7 x weekly - 2 sets - 12 reps - 2-3 hold - Cervical Extension AROM with Strap  - 1 x daily - 7 x weekly - 2 sets - 12 reps - 2-3 hold - Scapular Retraction with Resistance  - 1 x daily - 3-4 x weekly - 3 sets - 8 reps - Scapular Retraction with Resistance Advanced  - 1 x daily - 3-4 x weekly - 3 sets - 8 reps - Standing Row with Resistance with Anchored Resistance at Chest Height Palms Down  - 1 x daily - 3-4 x weekly - 3 sets - 8 reps - Seated Thoracic Lumbar Extension  - 1 x daily - 3-4 x weekly - 2 sets - 12 reps - Sidelying Thoracic Rotation with Open Book  - 1 x daily -  3-4 x weekly - 2 sets - 12 reps   Access Code: [email or web address] URL: [email or web address] Date: 07/30/2022 Prepared by: Ronnie Derby  Exercises - Seated Cervical Retraction  - 1 x daily - 7 x weekly - 2 sets - 15 reps - Seated Upper Trapezius Stretch  - 1 x daily - 7 x weekly - 1 sets - 3 reps - Seated Assisted Cervical Rotation with Towel  - 1 x daily - 7 x weekly - 2 sets - 12 reps - 2-3 hold - Cervical Extension AROM with Strap  - 1 x daily - 7 x weekly - 2 sets - 12 reps - 2-3 hold - Scapular Retraction with Resistance  - 1 x daily - 3-4 x weekly - 3 sets - 8 reps - Scapular Retraction with Resistance Advanced  - 1 x daily - 3-4 x weekly - 3 sets - 8 reps - Standing Row with Resistance with Anchored Resistance at Chest Height Palms Down  - 1 x daily - 3-4 x weekly - 3 sets - 8 reps   Access Code: [email or web address] URL: [email or web address] Date: 07/19/2022 Prepared by: Ronnie Derby  Exercises - Seated Cervical Retraction  - 1 x daily - 7 x weekly - 2 sets  - 15 reps - Seated Upper Trapezius Stretch  - 1 x daily - 7 x weekly - 1 sets - 3 reps - Seated Assisted Cervical Rotation with Towel  - 1 x daily - 7 x weekly - 2 sets - 12 reps - 2-3 hold - Cervical Extension AROM with Strap  - 1 x daily - 7 x weekly - 2 sets - 12 reps - 2-3 hold   Access Code: [email or web address] URL: [email or web address] Date: 07/15/2022 Prepared by: Ronnie Derby  Exercises - Seated Cervical Retraction  - 1 x daily - 7 x weekly - 2 sets - 15 reps - Seated Upper Trapezius Stretch  - 1 x daily - 7 x weekly - 2 sets - 15 reps - Seated Cervical Rotation AROM  - 1 x daily - 7 x weekly - 2 sets - 15 reps  ASSESSMENT:  CLINICAL IMPRESSION: Pt on 10th visit. Pt has maximized functional mobility and outcomes with PT. Pt has made significant improvements in cervical pain, cervical AROM, radicular symptoms into LUE in radial nerve distribution, and pain with work related tasks. Pt given and reviewed robust cervicothoracic AROM and postural strengthening HEP. Pt understanding of exercises with no further questions and agreeable to D/c from PT at this time.   OBJECTIVE IMPAIRMENTS: decreased ROM, decreased strength, hypomobility, impaired flexibility, impaired sensation, impaired UE functional use, improper body mechanics, postural dysfunction, and pain.   ACTIVITY LIMITATIONS: lifting, standing, and sleeping  PARTICIPATION LIMITATIONS: community activity and occupation  PERSONAL FACTORS: Age, Past/current experiences, Profession, Time since onset of injury/illness/exacerbation, and 3+ comorbidities: HLD, HTN, systemic lupus erythematosus, hx of DVT, hx of knee and cervical OA, aortic atherosclerosis  are also affecting patient's functional outcome.   REHAB POTENTIAL: Good  CLINICAL DECISION MAKING: Evolving/moderate complexity  EVALUATION COMPLEXITY: Moderate   GOALS: Goals reviewed with patient? No  SHORT TERM GOALS: Target date: 08/11/22  Pt will be independent  with HEP to improve cervical AROM and radicular pain to return to PLOF. Baseline: 07/14/22: given initial HEP; 08/10/22: 100% compliance Goal status: MET  LONG TERM GOALS: Target date: 09/08/22  Pt will improve FOTO to target score to demonstrate clinically significant improvement in functional mobility.  Baseline: 07/14/22: deferred to next session; 07/16/22: 56 with target  score of 67; 08/17/22: 66/67 Goal status: PARTIALLY MET  2.  Pt will improve cervical extension and rotation AROM by at least 7 degrees in these planes for clinically significant improvement in mobility for overhead and rotational ADL's. Baseline: 07/14/22:   Active ROM A/PROM (deg) eval 08/17/22  Flexion 55 45  Extension 25* 40  Right lateral flexion 37 40  Left lateral flexion 37 45  Right rotation 55 65  Left rotation 50 60   Goal status: PARTIALLY MET  3.  Pt will report ability to stand at work for > 1.5 hours with no more than 4/10 pain in NPS to demonstrate clinically significant improvement in pain with standing work tasks.  Baseline: 07/14/22: Onset of 6/10 NPS cervical and LUE pain with standing for 1.5 hours or greater. ; 08/17/22: worst pain 1.5-2/10 NPS.  Goal status: MET  PLAN:  PT FREQUENCY: 1-2x/week  PT DURATION: 8 weeks  PLANNED INTERVENTIONS: Therapeutic exercises, Therapeutic activity, Neuromuscular re-education, Balance training, Gait training, Patient/Family education, Self Care, Joint mobilization, Joint manipulation, Dry Needling, Electrical stimulation, Spinal mobilization, Cryotherapy, Moist heat, Manual therapy, and Re-evaluation  PLAN FOR NEXT SESSION: N/A   Delphia Grates. Fairly IV, PT, DPT Physical Therapist- [place]  [facility]  08/17/2022, 3:48 PM

## 2022-09-09 ENCOUNTER — Encounter: Payer: Self-pay | Admitting: Nurse Practitioner

## 2022-09-24 ENCOUNTER — Encounter: Payer: No Typology Code available for payment source | Admitting: Nurse Practitioner

## 2022-09-26 NOTE — Patient Instructions (Signed)
Be Involved in Caring For Your Health:  Taking Medications When medications are taken as directed, they can greatly improve your health. But if they are not taken as prescribed, they may not work. In some cases, not taking them correctly can be harmful. To help ensure your treatment remains effective and safe, understand your medications and how to take them. Bring your medications to each visit for review by your provider.  Your lab results, notes, and after visit summary will be available on My Chart. We strongly encourage you to use this feature. If lab results are abnormal the clinic will contact you with the appropriate steps. If the clinic does not contact you assume the results are satisfactory. You can always view your results on My Chart. If you have questions regarding your health or results, please contact the clinic during office hours. You can also ask questions on My Chart.  We at [facility] are grateful that you chose us to provide your care. We strive to provide evidence-based and compassionate care and are always looking for feedback. If you get a survey from the clinic please complete this so we can hear your opinions.  DASH Eating Plan DASH stands for Dietary Approaches to Stop Hypertension. The DASH eating plan is a healthy eating plan that has been shown to: Lower high blood pressure (hypertension). Reduce your risk for type 2 diabetes, heart disease, and stroke. Help with weight loss. What are tips for following this plan? Reading food labels Check food labels for the amount of salt (sodium) per serving. Choose foods with less than 5 percent of the Daily Value (DV) of sodium. In general, foods with less than 300 milligrams (mg) of sodium per serving fit into this eating plan. To find whole grains, look for the word "whole" as the first word in the ingredient list. Shopping Buy products labeled as "low-sodium" or "no salt added." Buy fresh foods. Avoid canned  foods and pre-made or frozen meals. Cooking Try not to add salt when you cook. Use salt-free seasonings or herbs instead of table salt or sea salt. Check with your health care provider or pharmacist before using salt substitutes. Do not fry foods. Cook foods in healthy ways, such as baking, boiling, grilling, roasting, or broiling. Cook using oils that are good for your heart. These include olive, canola, avocado, soybean, and sunflower oil. Meal planning  Eat a balanced diet. This should include: 4 or more servings of fruits and 4 or more servings of vegetables each day. Try to fill half of your plate with fruits and vegetables. 6-8 servings of whole grains each day. 6 or less servings of lean meat, poultry, or fish each day. 1 oz is 1 serving. A 3 oz (85 g) serving of meat is about the same size as the palm of your hand. One egg is 1 oz (28 g). 2-3 servings of low-fat dairy each day. One serving is 1 cup (237 mL). 1 serving of nuts, seeds, or beans 5 times each week. 2-3 servings of heart-healthy fats. Healthy fats called omega-3 fatty acids are found in foods such as walnuts, flaxseeds, fortified milks, and eggs. These fats are also found in cold-water fish, such as sardines, salmon, and mackerel. Limit how much you eat of: Canned or prepackaged foods. Food that is high in trans fat, such as fried foods. Food that is high in saturated fat, such as fatty meat. Desserts and other sweets, sugary drinks, and other foods with added sugar. Full-fat   dairy products. Do not salt foods before eating. Do not eat more than 4 egg yolks a week. Try to eat at least 2 vegetarian meals a week. Eat more home-cooked food and less restaurant, buffet, and fast food. Lifestyle When eating at a restaurant, ask if your food can be made with less salt or no salt. If you drink alcohol: Limit how much you have to: 0-1 drink a day if you are male. 0-2 drinks a day if you are male. Know how much alcohol is in  your drink. In the [place] one drink is one 12 oz bottle of beer (355 mL), one 5 oz glass of wine (148 mL), or one 1 oz glass of hard liquor (44 mL). General information Avoid eating more than 2,300 mg of salt a day. If you have hypertension, you may need to reduce your sodium intake to 1,500 mg a day. Work with your provider to stay at a healthy body weight or lose weight. Ask what the best weight range is for you. On most days of the week, get at least 30 minutes of exercise that causes your heart to beat faster. This may include walking, swimming, or biking. Work with your provider or dietitian to adjust your eating plan to meet your specific calorie needs. What foods should I eat? Fruits All fresh, dried, or frozen fruit. Canned fruits that are in their natural juice and do not have sugar added to them. Vegetables Fresh or frozen vegetables that are raw, steamed, roasted, or grilled. Low-sodium or reduced-sodium tomato and vegetable juice. Low-sodium or reduced-sodium tomato sauce and tomato paste. Low-sodium or reduced-sodium canned vegetables. Grains Whole-grain or whole-wheat bread. Whole-grain or whole-wheat pasta. Brown rice. Oatmeal. Quinoa. Bulgur. Whole-grain and low-sodium cereals. Pita bread. Low-fat, low-sodium crackers. Whole-wheat flour tortillas. Meats and other proteins Skinless chicken or turkey. Ground chicken or turkey. Pork with fat trimmed off. Fish and seafood. Egg whites. Dried beans, peas, or lentils. Unsalted nuts, nut butters, and seeds. Unsalted canned beans. Lean cuts of beef with fat trimmed off. Low-sodium, lean precooked or cured meat, such as sausages or meat loaves. Dairy Low-fat (1%) or fat-free (skim) milk. Reduced-fat, low-fat, or fat-free cheeses. Nonfat, low-sodium ricotta or cottage cheese. Low-fat or nonfat yogurt. Low-fat, low-sodium cheese. Fats and oils Soft margarine without trans fats. Vegetable oil. Reduced-fat, low-fat, or light mayonnaise and salad  dressings (reduced-sodium). Canola, safflower, olive, avocado, soybean, and sunflower oils. Avocado. Seasonings and condiments Herbs. Spices. Seasoning mixes without salt. Other foods Unsalted popcorn and pretzels. Fat-free sweets. The items listed above may not be all the foods and drinks you can have. Talk to a dietitian to learn more. What foods should I avoid? Fruits Canned fruit in a light or heavy syrup. Fried fruit. Fruit in cream or butter sauce. Vegetables Creamed or fried vegetables. Vegetables in a cheese sauce. Regular canned vegetables that are not marked as low-sodium or reduced-sodium. Regular canned tomato sauce and paste that are not marked as low-sodium or reduced-sodium. Regular tomato and vegetable juices that are not marked as low-sodium or reduced-sodium. Pickles. Olives. Grains Baked goods made with fat, such as croissants, muffins, or some breads. Dry pasta or rice meal packs. Meats and other proteins Fatty cuts of meat. Ribs. Fried meat. Bacon. Bologna, salami, and other precooked or cured meats, such as sausages or meat loaves, that are not lean and low in sodium. Fat from the back of a pig (fatback). Bratwurst. Salted nuts and seeds. Canned beans with added salt. Canned   or smoked fish. Whole eggs or egg yolks. Chicken or turkey with skin. Dairy Whole or 2% milk, cream, and half-and-half. Whole or full-fat cream cheese. Whole-fat or sweetened yogurt. Full-fat cheese. Nondairy creamers. Whipped toppings. Processed cheese and cheese spreads. Fats and oils Butter. Stick margarine. Lard. Shortening. Ghee. Bacon fat. Tropical oils, such as coconut, palm kernel, or palm oil. Seasonings and condiments Onion salt, garlic salt, seasoned salt, table salt, and sea salt. Worcestershire sauce. Tartar sauce. Barbecue sauce. Teriyaki sauce. Soy sauce, including reduced-sodium soy sauce. Steak sauce. Canned and packaged gravies. Fish sauce. Oyster sauce. Cocktail sauce. Store-bought  horseradish. Ketchup. Mustard. Meat flavorings and tenderizers. Bouillon cubes. Hot sauces. Pre-made or packaged marinades. Pre-made or packaged taco seasonings. Relishes. Regular salad dressings. Other foods Salted popcorn and pretzels. The items listed above may not be all the foods and drinks you should avoid. Talk to a dietitian to learn more. Where to find more information National Heart, Lung, and Blood Institute (NHLBI): nhlbi.nih.gov American Heart Association (AHA): heart.org Academy of Nutrition and Dietetics: eatright.org National Kidney Foundation (NKF): kidney.org This information is not intended to replace advice given to you by your health care provider. Make sure you discuss any questions you have with your health care provider. Document Revised: 02/04/2022 Document Reviewed: 02/04/2022 Elsevier Patient Education  2024 Elsevier Inc.  

## 2022-09-29 ENCOUNTER — Ambulatory Visit (INDEPENDENT_AMBULATORY_CARE_PROVIDER_SITE_OTHER): Payer: No Typology Code available for payment source | Admitting: Nurse Practitioner

## 2022-09-29 ENCOUNTER — Encounter: Payer: Self-pay | Admitting: Nurse Practitioner

## 2022-09-29 VITALS — BP 104/64 | HR 65 | Ht 70.6 in | Wt 200.8 lb

## 2022-09-29 DIAGNOSIS — Z720 Tobacco use: Secondary | ICD-10-CM

## 2022-09-29 DIAGNOSIS — J432 Centrilobular emphysema: Secondary | ICD-10-CM | POA: Diagnosis not present

## 2022-09-29 DIAGNOSIS — E782 Mixed hyperlipidemia: Secondary | ICD-10-CM

## 2022-09-29 DIAGNOSIS — N400 Enlarged prostate without lower urinary tract symptoms: Secondary | ICD-10-CM

## 2022-09-29 DIAGNOSIS — N522 Drug-induced erectile dysfunction: Secondary | ICD-10-CM

## 2022-09-29 DIAGNOSIS — M3219 Other organ or system involvement in systemic lupus erythematosus: Secondary | ICD-10-CM

## 2022-09-29 DIAGNOSIS — Z86718 Personal history of other venous thrombosis and embolism: Secondary | ICD-10-CM

## 2022-09-29 DIAGNOSIS — D6869 Other thrombophilia: Secondary | ICD-10-CM | POA: Diagnosis not present

## 2022-09-29 DIAGNOSIS — K219 Gastro-esophageal reflux disease without esophagitis: Secondary | ICD-10-CM

## 2022-09-29 DIAGNOSIS — I700 Atherosclerosis of aorta: Secondary | ICD-10-CM | POA: Diagnosis not present

## 2022-09-29 DIAGNOSIS — Z0000 Encounter for general adult medical examination without abnormal findings: Secondary | ICD-10-CM

## 2022-09-29 DIAGNOSIS — I10 Essential (primary) hypertension: Secondary | ICD-10-CM

## 2022-09-29 MED ORDER — HYDROCHLOROTHIAZIDE 25 MG PO TABS: 12.5000 mg | ORAL_TABLET | Freq: Every day | ORAL | 4 refills | Status: DC

## 2022-09-29 MED ORDER — RIVAROXABAN 20 MG PO TABS: 20.0000 mg | ORAL_TABLET | Freq: Every day | ORAL | 4 refills | Status: DC

## 2022-09-29 MED ORDER — ATORVASTATIN CALCIUM 20 MG PO TABS: 20.0000 mg | ORAL_TABLET | Freq: Every day | ORAL | 4 refills | Status: DC

## 2022-09-29 MED ORDER — LISINOPRIL 20 MG PO TABS: 20.0000 mg | ORAL_TABLET | Freq: Every day | ORAL | 4 refills | Status: DC

## 2022-09-29 MED ORDER — AMLODIPINE BESYLATE 5 MG PO TABS: 5.0000 mg | ORAL_TABLET | Freq: Every day | ORAL | 4 refills | Status: DC

## 2022-09-29 NOTE — Assessment & Plan Note (Signed)
Chronic, ongoing.  Continue current medication regimen and adjust as needed. Lipid panel today. 

## 2022-09-29 NOTE — Assessment & Plan Note (Signed)
I have recommended complete cessation of tobacco use. I have discussed various options available for assistance with tobacco cessation including over the counter methods (Nicotine gum, patch and lozenges). We also discussed prescription options (Chantix, Nicotine Inhaler / Nasal Spray). The patient is not interested in pursuing any prescription tobacco cessation options at this time.  Referral for lung screening ordered.  

## 2022-09-29 NOTE — Assessment & Plan Note (Signed)
Chronic, stable with Omeprazole.  Continue current medication regimen and consider reduction in future.  Mag level annually.  Discussed the risks and benefits of long term PPI use including but not limited to bone loss, chronic kidney disease, infections, low magnesium.  Will aim to use at the lowest dose for the shortest period of time.  He has tried reduction without success.

## 2022-09-29 NOTE — Assessment & Plan Note (Signed)
Noted initially on lung screening 06/18/22.  Educated him on this finding.  Will continue statin therapy and Xarelto at home.

## 2022-09-29 NOTE — Assessment & Plan Note (Signed)
Will continue collaboration with his rheumatologist Dr. Deveshwar.  Continue current medication regimen as prescribed by them.  Recent note and labs reviewed. 

## 2022-09-29 NOTE — Assessment & Plan Note (Signed)
With long term anticoagulant use.  Monitor CBC regularly and watch for increased bleeding or bruising. 

## 2022-09-29 NOTE — Assessment & Plan Note (Signed)
Reviewed health maintenance with patient today: - Cologuard up to date, due next 04/27/2023 - Tetanus up to date, due next 08/27/2026 - Shingrix = refuses today - Covid vaccines = refuses today - AAA screening = is due and qualifies, education provided and will think about this - PCV13 = up to date - Lung Cancer Screening -- Up To Date

## 2022-09-29 NOTE — Assessment & Plan Note (Signed)
Chronic, stable.  BP well below goal today and on lower side, have recommend he monitor BP at least 3 times a week and document for next visit as we may be able to reduce BP medications.  Discussed at length with him.  Continue current medication regimen and adjust as needed.  Focus on DASH diet.  LABS: CMP, CBC, TSH.  Refills up to date. Return in 6 months.

## 2022-09-29 NOTE — Assessment & Plan Note (Signed)
Initially noted on lung cancer screening 06/18/22.  Educated him on this finding.  Recommend he cut back on vaping.  No current inhalers and denies symptoms.  Will obtain spirometry next visit.

## 2022-09-29 NOTE — Progress Notes (Signed)
BP 104/64   Pulse 65   Ht 5' 10.6" (1.793 m)   Wt 200 lb 12.8 oz (91.1 kg)   SpO2 96%   BMI 28.32 kg/m    Subjective:    Patient ID: Dwayne Jacobson, male    DOB: November 04, 1955, 67 y.o.   MRN: [id number]  HPI: Dwayne Jacobson is a 67 y.o. male presenting on 09/29/2022 for comprehensive medical examination. Current medical complaints include: none  He currently lives with: wife Interim Problems from his last visit: none  HYPERTENSION / HYPERLIPIDEMIA Taking Lisinopril, HCTZ, Atorvastatin, and Amlodipine.  History of a DVT and continues on Xarelto without issue.  Followed by Dr. Corliss Skains for SLE, taking Plaquenil daily.  Last visit 05/07/22 with no changes. Satisfied with current treatment? yes Duration of hypertension: chronic BP monitoring frequency: not checking BP range: not checking BP medication side effects: no Duration of hyperlipidemia: chronic Cholesterol medication side effects: no Cholesterol supplements: none Medication compliance: good compliance Aspirin: no Recent stressors: no Recurrent headaches: no Visual changes: no Palpitations: no Dyspnea: no Chest pain: no Lower extremity edema: no Dizzy/lightheaded: no  The 10-year ASCVD risk score (Arnett DK, et al., 2019) is: 15.9%   Values used to calculate the score:     Age: 43 years     Sex: Male     Is Non-Hispanic African American: No     Diabetic: No     Tobacco smoker: Yes     Systolic Blood Pressure: 104 mmHg     Is BP treated: Yes     HDL Cholesterol: 44 mg/dL     Total Cholesterol: 181 mg/dL  GERD He has stopped Protonix and taking Omeprazole 20 MG daily. GERD control status: stable  Satisfied with current treatment? yes Heartburn frequency: none Medication side effects: no  Medication compliance: stable Dysphagia: no Odynophagia:  no Hematemesis: no Blood in stool: no EGD: no  COPD Continues to vape nicotine.  Did smoke cigarettes in past, quit 9 years ago.  At the time smoke 1 PPD.  Went  for lung screening on 06/18/22, this noted moderate centrilobular and paraseptal emphysema + aortic atherosclerosis.  He denies any symptoms. COPD status: stable Satisfied with current treatment?: yes Oxygen use: no Dyspnea frequency: no Cough frequency: no Rescue inhaler frequency:   Limitation of activity: no Productive cough: no Last Spirometry: unknown Pneumovax: Up to Date Influenza: Up to Date   Functional Status Survey: Is the patient deaf or have difficulty hearing?: No Does the patient have difficulty seeing, even when wearing glasses/contacts?: No Does the patient have difficulty concentrating, remembering, or making decisions?: No Does the patient have difficulty walking or climbing stairs?: No Does the patient have difficulty dressing or bathing?: No Does the patient have difficulty doing errands alone such as visiting a doctor's office or shopping?: No  FALL RISK:    09/29/2022    1:14 PM 07/07/2022   11:17 AM 06/11/2022    3:03 PM 05/26/2022    9:42 AM 09/25/2021    8:11 AM  Fall Risk   Falls in the past year? 0 0 0 0 0  Number falls in past yr: 0 0 0 0 0  Injury with Fall? 0 0 0 0 0  Risk for fall due to : No Fall Risks No Fall Risks No Fall Risks No Fall Risks No Fall Risks  Follow up Falls evaluation completed Falls evaluation completed Falls evaluation completed Falls evaluation completed Falls evaluation completed    Depression Screen  09/29/2022    1:14 PM 07/07/2022   11:17 AM 06/11/2022    3:04 PM 05/26/2022    9:42 AM 09/25/2021    8:11 AM  Depression screen PHQ 2/9  Decreased Interest 0 0 0 0 0  Down, Depressed, Hopeless 0 0 0 0 0  PHQ - 2 Score 0 0 0 0 0  Altered sleeping 0 1 2 0 0  Tired, decreased energy 0 1 1 2  0  Change in appetite 0 0 0 0 0  Feeling bad or failure about yourself  0 0 0 0 0  Trouble concentrating 0 0 0 0 0  Moving slowly or fidgety/restless 0 0 0 0 0  Suicidal thoughts 0 0 0 0 0  PHQ-9 Score 0 2 3 2  0  Difficult doing  work/chores Not difficult at all Somewhat difficult Somewhat difficult  Not difficult at all      09/29/2022    1:15 PM 07/07/2022   11:17 AM 06/11/2022    3:04 PM 05/26/2022    9:42 AM  GAD 7 : Generalized Anxiety Score  Nervous, Anxious, on Edge 0 0 0 0  Control/stop worrying 0 0 0 0  Worry too much - different things 0 0 0 0  Trouble relaxing 0 0 0 0  Restless 0 0 0 0  Easily annoyed or irritable 0 0 0 0  Afraid - awful might happen 0 0 0 0  Total GAD 7 Score 0 0 0 0  Anxiety Difficulty Not difficult at all Not difficult at all Not difficult at all     Past Medical History:  Past Medical History:  Diagnosis Date   DVT (deep venous thrombosis) (HCC)    Hyperlipidemia    Hypertension    Seizures (HCC)    Skin cancer    Systemic lupus erythematosus (HCC)     Surgical History:  Past Surgical History:  Procedure Laterality Date   NO PAST SURGERIES      Medications:  Current Outpatient Medications on File Prior to Visit  Medication Sig   amLODipine (NORVASC) 5 MG tablet Take 1 tablet (5 mg total) by mouth daily.   atorvastatin (LIPITOR) 20 MG tablet Take 1 tablet (20 mg total) by mouth daily.   clobetasol (TEMOVATE) 0.05 % external solution Apply topically as needed.   hydrochlorothiazide (HYDRODIURIL) 25 MG tablet TAKE 1 TABLET BY MOUTH EVERY DAY IN THE MORNING (Patient taking differently: 12.5 mg. TAKE 1 TABLET BY MOUTH EVERY DAY IN THE MORNING)   hydroxychloroquine (PLAQUENIL) 200 MG tablet Take 200 mg by mouth daily.   lisinopril (ZESTRIL) 20 MG tablet Take 1 tablet (20 mg total) by mouth daily.   methocarbamol (ROBAXIN) 500 MG tablet Take 1 tablet (500 mg total) by mouth 2 (two) times daily as needed for muscle spasms.   Multiple Vitamin (MULTIVITAMIN) tablet Take 1 tablet by mouth daily.   omeprazole (PRILOSEC) 20 MG capsule Take 20 mg by mouth daily.   Polyethyl Glycol-Propyl Glycol (SYSTANE) 0.4-0.3 % SOLN Apply to eye.   rivaroxaban (XARELTO) 20 MG TABS tablet Take  1 tablet (20 mg total) by mouth daily with supper.   sildenafil (VIAGRA) 100 MG tablet Take 0.5-1 tablets (50-100 mg total) by mouth daily as needed for erectile dysfunction.   No current facility-administered medications on file prior to visit.    Allergies:  No Known Allergies  Social History:  Social History   Socioeconomic History   Marital status: Married    Spouse name: Not  on file   Number of children: Not on file   Years of education: Not on file   Highest education level: Not on file  Occupational History   Not on file  Tobacco Use   Smoking status: Former    Current packs/day: 0.00    Average packs/day: 1 pack/day for 43.0 years (43.0 ttl pk-yrs)    Types: Cigarettes    Start date: 08/31/1970    Quit date: 08/30/2013    Years since quitting: 9.0    Passive exposure: Never   Smokeless tobacco: Former    Types: Engineer, drilling   Vaping status: Every Day   Substances: Nicotine  Substance and Sexual Activity   Alcohol use: Yes    Alcohol/week: 8.0 - 10.0 standard drinks of alcohol    Types: 8 - 10 Cans of beer per week   Drug use: Never   Sexual activity: Yes  Other Topics Concern   Not on file  Social History Narrative   Not on file   Social Determinants of Health   Financial Resource Strain: Low Risk  (09/25/2021)   Overall Financial Resource Strain (CARDIA)    Difficulty of Paying Living Expenses: Not hard at all  Food Insecurity: No Food Insecurity (09/25/2021)   Hunger Vital Sign    Worried About Running Out of Food in the Last Year: Never true    Ran Out of Food in the Last Year: Never true  Transportation Needs: No Transportation Needs (09/25/2021)   PRAPARE - Administrator, Civil Service (Medical): No    Lack of Transportation (Non-Medical): No  Physical Activity: Insufficiently Active (09/25/2021)   Exercise Vital Sign    Days of Exercise per Week: 4 days    Minutes of Exercise per Session: 30 min  Stress: No Stress Concern Present  (09/25/2021)   [employer] of Occupational Health - Occupational Stress Questionnaire    Feeling of Stress : Not at all  Social Connections: Moderately Isolated (09/25/2021)   Social Connection and Isolation Panel [NHANES]    Frequency of Communication with Friends and Family: Three times a week    Frequency of Social Gatherings with Friends and Family: Three times a week    Attends Religious Services: Never    Active Member of Clubs or Organizations: No    Attends Banker Meetings: Never    Marital Status: Married  Catering manager Violence: Not At Risk (09/19/2020)   Humiliation, Afraid, Rape, and Kick questionnaire    Fear of Current or Ex-Partner: No    Emotionally Abused: No    Physically Abused: No    Sexually Abused: No   Social History   Tobacco Use  Smoking Status Former   Current packs/day: 0.00   Average packs/day: 1 pack/day for 43.0 years (43.0 ttl pk-yrs)   Types: Cigarettes   Start date: 08/31/1970   Quit date: 08/30/2013   Years since quitting: 9.0   Passive exposure: Never  Smokeless Tobacco Former   Types: Chew   Social History   Substance and Sexual Activity  Alcohol Use Yes   Alcohol/week: 8.0 - 10.0 standard drinks of alcohol   Types: 8 - 10 Cans of beer per week    Family History:  Family History  Problem Relation Age of Onset   Diabetes Mother    Hypertension Mother    Hyperlipidemia Mother    Stroke Mother    Lung disease Mother    Heart disease Mother  Heart disease Father    Lung disease Father    Diabetes Sister    Colon polyps Maternal Grandmother     Past medical history, surgical history, medications, allergies, family history and social history reviewed with patient today and changes made to appropriate areas of the chart.   Review of Systems - negative All other ROS negative except what is listed above and in the HPI.      Objective:    BP 104/64   Pulse 65   Ht 5' 10.6" (1.793 m)   Wt 200 lb 12.8 oz  (91.1 kg)   SpO2 96%   BMI 28.32 kg/m   Wt Readings from Last 3 Encounters:  09/29/22 200 lb 12.8 oz (91.1 kg)  08/13/22 204 lb 4.8 oz (92.7 kg)  07/07/22 205 lb 3.2 oz (93.1 kg)    Physical Exam Vitals and nursing note reviewed.  Constitutional:      General: He is awake. He is not in acute distress.    Appearance: He is well-developed and well-groomed. He is not ill-appearing.  HENT:     Head: Normocephalic and atraumatic.     Right Ear: Hearing, ear canal and external ear normal. No drainage.     Left Ear: Hearing, ear canal and external ear normal. No drainage.     Nose: Nose normal.     Mouth/Throat:     Pharynx: Uvula midline.  Eyes:     General: Lids are normal.        Right eye: No discharge.        Left eye: No discharge.     Extraocular Movements: Extraocular movements intact.     Conjunctiva/sclera: Conjunctivae normal.     Pupils: Pupils are equal, round, and reactive to light.     Visual Fields: Right eye visual fields normal and left eye visual fields normal.  Neck:     Thyroid: No thyromegaly.     Vascular: No carotid bruit or JVD.     Trachea: Trachea normal.  Cardiovascular:     Rate and Rhythm: Normal rate and regular rhythm.     Heart sounds: Normal heart sounds, S1 normal and S2 normal. No murmur heard.    No gallop.  Pulmonary:     Effort: Pulmonary effort is normal. No accessory muscle usage or respiratory distress.     Breath sounds: Normal breath sounds.  Abdominal:     General: Bowel sounds are normal.     Palpations: Abdomen is soft. There is no hepatomegaly or splenomegaly.     Tenderness: There is no abdominal tenderness.  Musculoskeletal:        General: Normal range of motion.     Cervical back: Normal range of motion and neck supple.     Right lower leg: No edema.     Left lower leg: No edema.  Lymphadenopathy:     Head:     Right side of head: No submental, submandibular, tonsillar, preauricular or posterior auricular adenopathy.      Left side of head: No submental, submandibular, tonsillar, preauricular or posterior auricular adenopathy.     Cervical: No cervical adenopathy.  Skin:    General: Skin is warm and dry.     Capillary Refill: Capillary refill takes less than 2 seconds.     Findings: No rash.  Neurological:     Mental Status: He is alert and oriented to person, place, and time.     Gait: Gait is intact.  Deep Tendon Reflexes: Reflexes are normal and symmetric.     Reflex Scores:      Brachioradialis reflexes are 2+ on the right side and 2+ on the left side.      Patellar reflexes are 2+ on the right side and 2+ on the left side. Psychiatric:        Attention and Perception: Attention normal.        Mood and Affect: Mood normal.        Speech: Speech normal.        Behavior: Behavior normal. Behavior is cooperative.        Thought Content: Thought content normal.        Cognition and Memory: Cognition normal.        Judgment: Judgment normal.    Results for orders placed or performed in visit on 05/07/22  Protein / creatinine ratio, urine  Result Value Ref Range   Creatinine, Urine 52 20 - 320 mg/dL   Protein/Creat Ratio NOTE 25 - 148 mg/g creat   Protein/Creatinine Ratio NOTE 0.025 - 0.148 mg/mg creat   Total Protein, Urine <4 (L) 5 - 25 mg/dL  CBC with Differential/Platelet  Result Value Ref Range   WBC 5.8 3.8 - 10.8 Thousand/uL   RBC 4.77 4.20 - 5.80 Million/uL   Hemoglobin 14.8 13.2 - 17.1 g/dL   HCT 16.1 09.6 - 04.5 %   MCV 91.6 80.0 - 100.0 fL   MCH 31.0 27.0 - 33.0 pg   MCHC 33.9 32.0 - 36.0 g/dL   RDW 40.9 81.1 - 91.4 %   Platelets 278 140 - 400 Thousand/uL   MPV 9.7 7.5 - 12.5 fL   Neutro Abs 3,799 1,500 - 7,800 cells/uL   Lymphs Abs 1,085 850 - 3,900 cells/uL   Absolute Monocytes 684 200 - 950 cells/uL   Eosinophils Absolute 180 15 - 500 cells/uL   Basophils Absolute 52 0 - 200 cells/uL   Neutrophils Relative % 65.5 %   Total Lymphocyte 18.7 %   Monocytes Relative 11.8 %    Eosinophils Relative 3.1 %   Basophils Relative 0.9 %  COMPLETE METABOLIC PANEL WITH GFR  Result Value Ref Range   Glucose, Bld 97 65 - 99 mg/dL   BUN 10 7 - 25 mg/dL   Creat 7.82 9.56 - 2.13 mg/dL   eGFR 95 > OR = 60 YQ/MVH/8.46N6   BUN/Creatinine Ratio SEE NOTE: 6 - 22 (calc)   Sodium 136 135 - 146 mmol/L   Potassium 4.8 3.5 - 5.3 mmol/L   Chloride 98 98 - 110 mmol/L   CO2 28 20 - 32 mmol/L   Calcium 10.0 8.6 - 10.3 mg/dL   Total Protein 7.8 6.1 - 8.1 g/dL   Albumin 4.8 3.6 - 5.1 g/dL   Globulin 3.0 1.9 - 3.7 g/dL (calc)   AG Ratio 1.6 1.0 - 2.5 (calc)   Total Bilirubin 0.4 0.2 - 1.2 mg/dL   Alkaline phosphatase (APISO) 79 35 - 144 U/L   AST 25 10 - 35 U/L   ALT 25 9 - 46 U/L  Anti-DNA antibody, double-stranded  Result Value Ref Range   ds DNA Ab 4 IU/mL  C3 and C4  Result Value Ref Range   C3 Complement 136 82 - 185 mg/dL   C4 Complement 27 15 - 53 mg/dL  Sedimentation rate  Result Value Ref Range   Sed Rate 11 0 - 20 mm/h      Assessment & Plan:   Problem  List Items Addressed This Visit       Cardiovascular and Mediastinum   Aortic atherosclerosis (HCC)    Noted initially on lung screening 06/18/22.  Educated him on this finding.  Will continue statin therapy and Xarelto at home.      Relevant Orders   Comprehensive metabolic panel   Lipid Panel w/o Chol/HDL Ratio   Hypertension    Chronic, stable.  BP well below goal today and on lower side, have recommend he monitor BP at least 3 times a week and document for next visit as we may be able to reduce BP medications.  Discussed at length with him.  Continue current medication regimen and adjust as needed.  Focus on DASH diet.  LABS: CMP, CBC, TSH.  Refills up to date. Return in 6 months.      Relevant Orders   Comprehensive metabolic panel   TSH     Respiratory   Emphysema lung (HCC)    Initially noted on lung cancer screening 06/18/22.  Educated him on this finding.  Recommend he cut back on vaping.  No  current inhalers and denies symptoms.  Will obtain spirometry next visit.        Digestive   Gastroesophageal reflux disease without esophagitis    Chronic, stable with Omeprazole.  Continue current medication regimen and consider reduction in future.  Mag level annually.  Discussed the risks and benefits of long term PPI use including but not limited to bone loss, chronic kidney disease, infections, low magnesium.  Will aim to use at the lowest dose for the shortest period of time.  He has tried reduction without success.      Relevant Medications   omeprazole (PRILOSEC) 20 MG capsule   Other Relevant Orders   Magnesium     Hematopoietic and Hemostatic   Other thrombophilia (HCC)    With long term anticoagulant use.  Monitor CBC regularly and watch for increased bleeding or bruising.      Relevant Orders   CBC with Differential/Platelet     Other   Current every day nicotine vaping    I have recommended complete cessation of tobacco use. I have discussed various options available for assistance with tobacco cessation including over the counter methods (Nicotine gum, patch and lozenges). We also discussed prescription options (Chantix, Nicotine Inhaler / Nasal Spray). The patient is not interested in pursuing any prescription tobacco cessation options at this time.  Referral for lung screening ordered.       Erectile dysfunction    Due to BP medications -- will continue Viagra, he has taken in past.  PSA on labs today.      Relevant Orders   PSA   Healthcare maintenance    Reviewed health maintenance with patient today: - Cologuard up to date, due next 04/27/2023 - Tetanus up to date, due next 08/27/2026 - Shingrix = refuses today - Covid vaccines = refuses today - AAA screening = is due and qualifies, education provided and will think about this - PCV13 = up to date - Lung Cancer Screening -- Up To Date      History of DVT (deep vein thrombosis)    Chronic, stable on  Xarelto.  Hematology consult in 2020 and was able to transition to Xarelto, which he has taken without issue and has offered benefit to lifestyle.  CMP today.      Hyperlipidemia    Chronic, ongoing.  Continue current medication regimen and adjust as needed.  Lipid  panel today.      Relevant Orders   Comprehensive metabolic panel   Lipid Panel w/o Chol/HDL Ratio   SLE (systemic lupus erythematosus) (HCC) - Primary    Will continue collaboration with his rheumatologist Dr. Corliss Skains.  Continue current medication regimen as prescribed by them.  Recent note and labs reviewed.      Other Visit Diagnoses     Encounter for annual physical exam       Annual physical today with labs and health maintenance reviewed, discussed with patient.       Discussed aspirin prophylaxis for myocardial infarction prevention and decision was it was not indicated  LABORATORY TESTING:  Health maintenance labs ordered today as discussed above.   The natural history of prostate cancer and ongoing controversy regarding screening and potential treatment outcomes of prostate cancer has been discussed with the patient. The meaning of a false positive PSA and a false negative PSA has been discussed. He indicates understanding of the limitations of this screening test and wishes to proceed with screening PSA testing.   IMMUNIZATIONS:   - Tdap: Tetanus vaccination status reviewed: last tetanus booster within 10 years -- due next 08/27/2026 - Influenza: Will get later in season - Pneumovax: Up To Date - Prevnar: Up To Date - Zostavax vaccine: Refused  SCREENING: - Colonoscopy: Up to date -- Cologuard due next 04/27/2023 Discussed with patient purpose of the colonoscopy is to detect colon cancer at curable precancerous or early stages   - AAA Screening: educated on and think about -Hearing Test: Not applicable  -Spirometry: Not applicable   PATIENT COUNSELING:    Sexuality: Discussed sexually transmitted  diseases, partner selection, use of condoms, avoidance of unintended pregnancy  and contraceptive alternatives.   Advised to avoid cigarette smoking.  I discussed with the patient that most people either abstain from alcohol or drink within safe limits (<=14/week and <=4 drinks/occasion for males, <=7/weeks and <= 3 drinks/occasion for females) and that the risk for alcohol disorders and other health effects rises proportionally with the number of drinks per week and how often a drinker exceeds daily limits.  Discussed cessation/primary prevention of drug use and availability of treatment for abuse.   Diet: Encouraged to adjust caloric intake to maintain  or achieve ideal body weight, to reduce intake of dietary saturated fat and total fat, to limit sodium intake by avoiding high sodium foods and not adding table salt, and to maintain adequate dietary potassium and calcium preferably from fresh fruits, vegetables, and low-fat dairy products.    Stressed the importance of regular exercise  Injury prevention: Discussed safety belts, safety helmets, smoke detector, smoking near bedding or upholstery.   Dental health: Discussed importance of regular tooth brushing, flossing, and dental visits.   Follow up plan: NEXT PREVENTATIVE PHYSICAL DUE IN 1 YEAR. Return in about 6 months (around 04/01/2023) for HTN/HLD, SLE, COPD  -- need spirometry.

## 2022-09-29 NOTE — Assessment & Plan Note (Signed)
Chronic, stable on Xarelto.  Hematology consult in 2020 and was able to transition to Xarelto, which he has taken without issue and has offered benefit to lifestyle.  CMP today.

## 2022-09-29 NOTE — Assessment & Plan Note (Signed)
Due to BP medications -- will continue Viagra, he has taken in past.  PSA on labs today.

## 2022-09-30 LAB — LIPID PANEL W/O CHOL/HDL RATIO
Cholesterol, Total: 150 mg/dL (ref 100–199)
HDL: 45 mg/dL (ref >39)
LDL Chol Calc (NIH): 78 mg/dL (ref 0–99)
Triglycerides: 159 mg/dL — ABNORMAL HIGH (ref 0–149)
VLDL Cholesterol Cal: 27 mg/dL (ref 5–40)

## 2022-09-30 LAB — COMPREHENSIVE METABOLIC PANEL
ALT: 32 IU/L (ref 0–44)
AST: 27 IU/L (ref 0–40)
Albumin: 4.7 g/dL (ref 3.9–4.9)
Alkaline Phosphatase: 79 IU/L (ref 44–121)
BUN/Creatinine Ratio: 13 (ref 10–24)
BUN: 10 mg/dL (ref 8–27)
Bilirubin Total: 0.3 mg/dL (ref 0.0–1.2)
CO2: 22 mmol/L (ref 20–29)
Calcium: 9.6 mg/dL (ref 8.6–10.2)
Chloride: 98 mmol/L (ref 96–106)
Creatinine, Ser: 0.78 mg/dL (ref 0.76–1.27)
Globulin, Total: 2.6 g/dL (ref 1.5–4.5)
Glucose: 89 mg/dL (ref 70–99)
Potassium: 3.8 mmol/L (ref 3.5–5.2)
Sodium: 137 mmol/L (ref 134–144)
Total Protein: 7.3 g/dL (ref 6.0–8.5)
eGFR: 98 mL/min/{1.73_m2} (ref >59)

## 2022-09-30 LAB — CBC WITH DIFFERENTIAL/PLATELET
Basophils Absolute: 0 10*3/uL (ref 0.0–0.2)
Basos: 1 %
EOS (ABSOLUTE): 0.1 10*3/uL (ref 0.0–0.4)
Eos: 2 %
Hematocrit: 39.1 % (ref 37.5–51.0)
Hemoglobin: 13.3 g/dL (ref 13.0–17.7)
Immature Grans (Abs): 0 10*3/uL (ref 0.0–0.1)
Immature Granulocytes: 0 %
Lymphocytes Absolute: 1.2 10*3/uL (ref 0.7–3.1)
Lymphs: 20 %
MCH: 31.3 pg (ref 26.6–33.0)
MCHC: 34 g/dL (ref 31.5–35.7)
MCV: 92 fL (ref 79–97)
Monocytes Absolute: 0.7 10*3/uL (ref 0.1–0.9)
Monocytes: 12 %
Neutrophils Absolute: 3.9 10*3/uL (ref 1.4–7.0)
Neutrophils: 65 %
Platelets: 305 10*3/uL (ref 150–450)
RBC: 4.25 x10E6/uL (ref 4.14–5.80)
RDW: 12.8 % (ref 11.6–15.4)
WBC: 5.9 10*3/uL (ref 3.4–10.8)

## 2022-09-30 LAB — PSA: Prostate Specific Ag, Serum: 0.7 ng/mL (ref 0.0–4.0)

## 2022-09-30 LAB — TSH: TSH: 1.79 u[IU]/mL (ref 0.450–4.500)

## 2022-09-30 LAB — MAGNESIUM: Magnesium: 2 mg/dL (ref 1.6–2.3)

## 2022-09-30 NOTE — Progress Notes (Signed)
Contacted via MyChart   Good evening Dwayne Jacobson, your labs have returned and overall look fantastic with exception of mild elevation in triglycerides.  I recommend continuing all current medications.  Great job!! Any questions? Keep being amazing!!  Thank you for allowing me to participate in your care.  I appreciate you. Kindest regards, Cantrell Larouche

## 2022-10-01 NOTE — Progress Notes (Signed)
Office Visit Note  Patient: Dwayne Jacobson             Date of Birth: 1955/02/06           MRN: [id number]             PCP: Marjie Skiff, NP Referring: Marjie Skiff, NP Visit Date: 10/15/2022 Occupation: @GUAROCC @  Subjective:  Stable   History of Present Illness: RUTHFORD KOZICKI is a 67 y.o. male with history of systemic lupus.  Patient remains on plaquenil 200 mg 1 tablet by mouth daily.  He continues to tolerate Plaquenil without any side effects or interruptions in therapy.  He denies any signs or symptoms of a systemic lupus flare.  Patient denies any new or worsening symptoms since his last office visit.  He reports that his energy level has been stable.  He denies any joint pain or joint swelling.  He has not been experiencing any morning stiffness or nocturnal pain.  He denies any sores in his mouth or nose.  He continues to have chronic dry eyes which are managed with the use of Systane.  He denies any swollen lymph nodes.  He denies any new rashes continues to follow-up with dermatology on a yearly basis.  Patient reports that he has an upcoming appointment scheduled.  He denies any symptoms of Raynaud's phenomenon.  He denies any shortness of breath, pleuritic chest pain, or palpitations.  He remains on Xarelto as prescribed.  Activities of Daily Living:  Patient reports morning stiffness for 0 minutes  Patient Denies nocturnal pain.  Difficulty dressing/grooming: Denies Difficulty climbing stairs: Denies Difficulty getting out of chair: Denies Difficulty using hands for taps, buttons, cutlery, and/or writing: Denies  Review of Systems  Constitutional:  Negative for fatigue and night sweats.  HENT:  Negative for mouth sores, mouth dryness and nose dryness.   Eyes:  Positive for dryness. Negative for redness.  Respiratory:  Negative for shortness of breath and difficulty breathing.   Cardiovascular:  Negative for chest pain, palpitations, hypertension, irregular  heartbeat and swelling in legs/feet.  Gastrointestinal:  Negative for blood in stool, constipation and diarrhea.  Endocrine: Negative for increased urination.  Genitourinary:  Negative for painful urination.  Musculoskeletal:  Negative for joint pain, joint pain, joint swelling, myalgias, muscle weakness, morning stiffness, muscle tenderness and myalgias.  Skin:  Positive for rash. Negative for color change, hair loss, nodules/bumps, skin tightness, ulcers and sensitivity to sunlight.  Allergic/Immunologic: Negative for susceptible to infections.  Neurological:  Negative for dizziness, fainting, memory loss, night sweats and weakness.  Hematological:  Negative for swollen glands.  Psychiatric/Behavioral:  Negative for depressed mood and sleep disturbance. The patient is not nervous/anxious.     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   Current every day nicotine vaping 10/31/2020   Healthcare maintenance 09/19/2020   Gastroesophageal reflux disease without esophagitis 08/17/2019   Chondrocalcinosis 01/13/2017   Primary osteoarthritis of both knees 01/13/2017   History of DVT (deep vein thrombosis)    Hypertension    Hyperlipidemia    SLE (systemic lupus erythematosus) (HCC) 01/18/2014    Past Medical History:  Diagnosis Date   DVT (deep venous thrombosis) (HCC)    Hyperlipidemia    Hypertension  Seizures (HCC)    Skin cancer    Systemic lupus erythematosus (HCC)     Family History  Problem Relation Age of Onset   Diabetes Mother    Hypertension Mother    Hyperlipidemia Mother    Stroke Mother    Lung disease Mother    Heart disease Mother    Heart disease Father    Lung disease Father    Diabetes Sister     Colon polyps Maternal Grandmother    Past Surgical History:  Procedure Laterality Date   NO PAST SURGERIES     Social History   Social History Narrative   Not on file   Immunization History  Administered Date(s) Administered   Fluad Quad(high Dose 65+) 12/05/2020   Influenza,inj,Quad PF,6+ Mos 01/03/2015, 11/05/2016, 11/07/2018   PNEUMOCOCCAL CONJUGATE-20 09/25/2021   Pneumococcal Polysaccharide-23 09/29/2012   Td 03/25/2006, 08/06/2016     Objective: Vital Signs: BP 125/75 (BP Location: Left Arm, Patient Position: Sitting, Cuff Size: Normal)   Pulse 60   Resp 14   Ht 5\' 11"  (1.803 m)   Wt 202 lb (91.6 kg)   BMI 28.17 kg/m    Physical Exam Vitals and nursing note reviewed.  Constitutional:      Appearance: He is well-developed.  HENT:     Head: Normocephalic and atraumatic.  Eyes:     Conjunctiva/sclera: Conjunctivae normal.     Pupils: Pupils are equal, round, and reactive to light.  Cardiovascular:     Rate and Rhythm: Normal rate and regular rhythm.     Heart sounds: Normal heart sounds.  Pulmonary:     Effort: Pulmonary effort is normal.     Breath sounds: Normal breath sounds.  Abdominal:     General: Bowel sounds are normal.     Palpations: Abdomen is soft.  Musculoskeletal:     Cervical back: Normal range of motion and neck supple.  Skin:    General: Skin is warm and dry.     Capillary Refill: Capillary refill takes less than 2 seconds.  Neurological:     Mental Status: He is alert and oriented to person, place, and time.  Psychiatric:        Behavior: Behavior normal.      Musculoskeletal Exam: C-spine has slightly limited range of motion with lateral rotation.  No midline spinal tenderness.  No SI joint tenderness.  Shoulder joints, elbow joints, wrist joints, MCPs, PIPs, DIPs have good range of motion with no synovitis.  PIP and DIP thickening consistent with osteoarthritis of both hands.  CMC joint prominence noted bilaterally.  Hip joints have  good range of motion with no groin pain.  Knee joints have good range of motion no warmth or effusion.  Ankle joints have good range of motion with no tenderness or joint swelling.  CDAI Exam: CDAI Score: -- Patient Global: --; Provider Global: -- Swollen: --; Tender: -- Joint Exam 10/15/2022   No joint exam has been documented for this visit   There is currently no information documented on the homunculus. Go to the Rheumatology activity and complete the homunculus joint exam.  Investigation: No additional findings.  Imaging: No results found.  Recent Labs: Lab Results  Component Value Date   WBC 5.9 09/29/2022   HGB 13.3 09/29/2022   PLT 305 09/29/2022   NA 137 09/29/2022   K 3.8 09/29/2022   CL 98 09/29/2022   CO2 22 09/29/2022   GLUCOSE 89 09/29/2022   BUN 10 09/29/2022  CREATININE 0.78 09/29/2022   BILITOT 0.3 09/29/2022   ALKPHOS 79 09/29/2022   AST 27 09/29/2022   ALT 32 09/29/2022   PROT 7.3 09/29/2022   ALBUMIN 4.7 09/29/2022   CALCIUM 9.6 09/29/2022   GFRAA 103 03/21/2020    Speciality Comments: PLQ Eye Exam: 02/19/2022 WNL @ [facility] Follow up in 1 year  Procedures:  No procedures performed Allergies: Patient has no known allergies.   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 History of rash, diagnosed with lupus by Dr. Gavin Potters many years ago: He has not had any signs or symptoms of a systemic lupus flare.  He has clinically been doing well taking Plaquenil 200 mg 1 tablet by mouth daily.  He is tolerating Plaquenil without any side effects and has not had any eruptions in therapy.  He continues to follow-up with his dermatologist Dr. Adolphus Birchwood on a yearly basis and has an upcoming appointment scheduled.  He has not noticed any new or worsening symptoms since his last office visit.  No synovitis noted on examination today.  He has not had any oral or  nasal ulcerations.  His chronic dry eyes which are managed.  Systane eyedrops.  He has not had any symptoms of Raynaud's phenomenon.  Good capillary refill noted on examination today.  No digital ulcerations or signs of gangrene noted.  He has not had any shortness of breath, pleuritic chest pain, or palpitations.  He remains on Xarelto as prescribed. Reviewed lab work from 05/07/2022: Complements within normal limits, ESR within normal limits, double-stranded DNA negative, and no proteinuria.  CBC and CMP were within normal limits on 09/29/2022.  The following lab work will be obtained today for further evaluation.  He will remain on Plaquenil as prescribed.  He is vies notify us if he develops signs or symptoms of a flare.  Follow-up in the office in 5 months or sooner if needed. - Plan: Protein / creatinine ratio, urine, Anti-DNA antibody, double-stranded, Sedimentation rate, C3 and C4  High risk medication use - Plaquenil 200 mg 1 tablet by mouth daily-prescribed by Dr. Pilar Plate (dermatologist). PLQ Eye Exam: 02/19/2022 WNL @ [facility] Follow up in 1 year CBC and CMP WNL on 09/29/22.     Trapezius muscle spasm: Patient is not experiencing any increased muscle tension or tenderness.  He is not experiencing any muscle spasms at this time.  He has a prescription for methocarbamol which he takes very sparingly if he has muscle spasms.  Primary osteoarthritis of both knees - X-rays in 2018 showed moderate osteoarthritis.  Has good range of motion of both knee joints on examination today.  No warmth or effusion noted.  Chondrocalcinosis: No signs or symptoms of a pseudogout flare.  Other medical conditions are listed as follows:  Essential hypertension: BP 125/75 today in the office.   Mixed hyperlipidemia  History of DVT (deep vein thrombosis) - in 2001.  He is on long-term Xarelto.  Anticardiolipin, beta-2 GP 1 and lupus anticoagulant were negative when tested in July  2022.  Orders: Orders Placed This Encounter  Procedures   Protein / creatinine ratio, urine   Anti-DNA antibody, double-stranded   Sedimentation rate   C3 and C4   No orders of the defined types were placed in this encounter.    Follow-Up Instructions: Return in about 5 months (around 03/17/2023) for Systemic lupus erythematosus.   Ladona Ridgel  Fransisca Kaufmann, PA-C  Note - This record has been created using [employer].  Chart creation errors have been sought, but may not always  have been located. Such creation errors do not reflect on  the standard of medical care.

## 2022-10-08 ENCOUNTER — Ambulatory Visit: Payer: No Typology Code available for payment source | Admitting: Physician Assistant

## 2022-10-15 ENCOUNTER — Ambulatory Visit: Payer: No Typology Code available for payment source | Attending: Physician Assistant | Admitting: Physician Assistant

## 2022-10-15 ENCOUNTER — Encounter: Payer: Self-pay | Admitting: Physician Assistant

## 2022-10-15 VITALS — BP 125/75 | HR 60 | Resp 14 | Ht 71.0 in | Wt 202.0 lb

## 2022-10-15 DIAGNOSIS — M1120 Other chondrocalcinosis, unspecified site: Secondary | ICD-10-CM

## 2022-10-15 DIAGNOSIS — Z86718 Personal history of other venous thrombosis and embolism: Secondary | ICD-10-CM

## 2022-10-15 DIAGNOSIS — M3219 Other organ or system involvement in systemic lupus erythematosus: Secondary | ICD-10-CM

## 2022-10-15 DIAGNOSIS — E782 Mixed hyperlipidemia: Secondary | ICD-10-CM

## 2022-10-15 DIAGNOSIS — M170 Bilateral primary osteoarthritis of knee: Secondary | ICD-10-CM | POA: Diagnosis not present

## 2022-10-15 DIAGNOSIS — M62838 Other muscle spasm: Secondary | ICD-10-CM

## 2022-10-15 DIAGNOSIS — Z79899 Other long term (current) drug therapy: Secondary | ICD-10-CM

## 2022-10-15 DIAGNOSIS — I10 Essential (primary) hypertension: Secondary | ICD-10-CM

## 2022-10-16 LAB — PROTEIN / CREATININE RATIO, URINE
Creatinine, Urine: 32 mg/dL (ref 20–320)
Protein/Creat Ratio: 125 mg/g{creat} (ref 25–148)
Protein/Creatinine Ratio: 0.125 mg/mg{creat} (ref 0.025–0.148)
Total Protein, Urine: 4 mg/dL — ABNORMAL LOW (ref 5–25)

## 2022-10-16 LAB — SEDIMENTATION RATE: Sed Rate: 6 mm/h (ref 0–20)

## 2022-10-16 LAB — ANTI-DNA ANTIBODY, DOUBLE-STRANDED: ds DNA Ab: 4 [IU]/mL

## 2022-10-16 LAB — C3 AND C4
C3 Complement: 132 mg/dL (ref 82–185)
C4 Complement: 26 mg/dL (ref 15–53)

## 2022-10-18 NOTE — Progress Notes (Signed)
No proteinuria.  ESR WNL Complements WNL dsDNA is negative

## 2022-12-05 ENCOUNTER — Other Ambulatory Visit: Payer: Self-pay | Admitting: Nurse Practitioner

## 2022-12-06 NOTE — Telephone Encounter (Signed)
Rx - changed to 1/2 tablet 09/29/22  #45 4RF- requested RF no longer current dosing Requested Prescriptions  Pending Prescriptions Disp Refills   hydrochlorothiazide (HYDRODIURIL) 25 MG tablet [Pharmacy Med Name: HYDROCHLOROTHIAZIDE 25 MG TAB] 90 tablet 4    Sig: TAKE 1 TABLET BY MOUTH EVERY DAY IN THE MORNING     Cardiovascular: Diuretics - Thiazide Passed - 12/05/2022  9:21 AM      Passed - Cr in normal range and within 180 days    Creat  Date Value Ref Range Status  05/07/2022 0.87 0.70 - 1.35 mg/dL Final   Creatinine, Ser  Date Value Ref Range Status  09/29/2022 0.78 0.76 - 1.27 mg/dL Final   Creatinine, Urine  Date Value Ref Range Status  10/15/2022 32 20 - 320 mg/dL Final         Passed - K in normal range and within 180 days    Potassium  Date Value Ref Range Status  09/29/2022 3.8 3.5 - 5.2 mmol/L Final         Passed - Na in normal range and within 180 days    Sodium  Date Value Ref Range Status  09/29/2022 137 134 - 144 mmol/L Final         Passed - Last BP in normal range    BP Readings from Last 1 Encounters:  10/15/22 125/75         Passed - Valid encounter within last 6 months    Recent Outpatient Visits           2 months ago 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)   [place] [facility] [place], Jolene T, NP   3 months ago Degeneration of intervertebral disc of cervical spine without disc herniation   [place] [facility] [place], Corrie Dandy T, NP   5 months ago Degeneration of intervertebral disc of cervical spine without disc herniation   [place] [facility] [place], Corrie Dandy T, NP   5 months ago Degeneration of intervertebral disc of cervical spine without disc herniation   [place] [facility] [place], Dorie Rank, NP   6 months ago Neck pain   [place] [facility] [place], Dorie Rank, NP       Future Appointments             In 3 months  Deveshwar, Janalyn Rouse, MD [facility] Health Rheumatology - A Dept Of [place]. [facility]   In 3 months [place], Dorie Rank, NP [place] [facility], [place]

## 2022-12-22 ENCOUNTER — Other Ambulatory Visit: Payer: Self-pay | Admitting: Nurse Practitioner

## 2022-12-23 NOTE — Telephone Encounter (Signed)
Requested Prescriptions  Refused Prescriptions Disp Refills   hydrochlorothiazide (HYDRODIURIL) 25 MG tablet [Pharmacy Med Name: HYDROCHLOROTHIAZIDE 25 MG TAB] 90 tablet 4    Sig: TAKE 1 TABLET BY MOUTH EVERY DAY IN THE MORNING     Cardiovascular: Diuretics - Thiazide Passed - 12/22/2022  1:50 AM      Passed - Cr in normal range and within 180 days    Creat  Date Value Ref Range Status  05/07/2022 0.87 0.70 - 1.35 mg/dL Final   Creatinine, Ser  Date Value Ref Range Status  09/29/2022 0.78 0.76 - 1.27 mg/dL Final   Creatinine, Urine  Date Value Ref Range Status  10/15/2022 32 20 - 320 mg/dL Final         Passed - K in normal range and within 180 days    Potassium  Date Value Ref Range Status  09/29/2022 3.8 3.5 - 5.2 mmol/L Final         Passed - Na in normal range and within 180 days    Sodium  Date Value Ref Range Status  09/29/2022 137 134 - 144 mmol/L Final         Passed - Last BP in normal range    BP Readings from Last 1 Encounters:  10/15/22 125/75         Passed - Valid encounter within last 6 months    Recent Outpatient Visits           2 months ago 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)   [place] [facility] [place], Jolene T, NP   4 months ago Degeneration of intervertebral disc of cervical spine without disc herniation   [place] [facility] [place], Corrie Dandy T, NP   5 months ago Degeneration of intervertebral disc of cervical spine without disc herniation   [place] [facility] [place], Corrie Dandy T, NP   6 months ago Degeneration of intervertebral disc of cervical spine without disc herniation   [place] [facility] [place], Dorie Rank, NP   7 months ago Neck pain   [place] [facility] [place], Dorie Rank, NP       Future Appointments             In 3 months Deveshwar, Janalyn Rouse, MD [place] [facility] Health Rheumatology - A Dept Of [place]. [facility]   In 3 months [place], Dorie Rank, NP [place] [facility], [place]

## 2023-01-11 ENCOUNTER — Encounter: Payer: Self-pay | Admitting: Nurse Practitioner

## 2023-01-11 ENCOUNTER — Ambulatory Visit: Payer: No Typology Code available for payment source | Admitting: Nurse Practitioner

## 2023-01-11 VITALS — BP 131/74 | HR 57 | Temp 97.6°F | Wt 200.8 lb

## 2023-01-11 DIAGNOSIS — M5030 Other cervical disc degeneration, unspecified cervical region: Secondary | ICD-10-CM

## 2023-01-11 MED ORDER — PREDNISONE 20 MG PO TABS: 40.0000 mg | ORAL_TABLET | Freq: Every day | ORAL | 0 refills | Status: AC

## 2023-01-11 MED ORDER — HYDROCODONE-ACETAMINOPHEN 5-325 MG PO TABS: 1.0000 | ORAL_TABLET | Freq: Four times a day (QID) | ORAL | 0 refills | Status: AC | PRN

## 2023-01-11 NOTE — Patient Instructions (Signed)
Cervical Radiculopathy  Cervical radiculopathy means that a nerve in the neck (a cervical nerve) is pinched or bruised. This can happen because of an injury to the cervical spine (vertebrae) in the neck, or as a normal part of getting older. This condition can cause pain or loss of feeling (numbness) that runs from your neck all the way down to your arm and fingers. Often, this condition gets better with rest. Treatment may be needed if the condition does not get better. What are the causes? A neck injury. A bulging disk in your spine. Sudden muscle tightening (muscle spasms). Tight muscles in your neck due to overuse. Arthritis. Breakdown in the bones and joints of the spine (spondylosis) due to getting older. Bone spurs that form near the nerves in the neck. What are the signs or symptoms? Pain. The pain may: Run from the neck to the arm and hand. Be very bad or irritating. Get worse when you move your neck. Loss of feeling or tingling in your arm or hand. Weakness in your arm or hand, in very bad cases. How is this treated? In many cases, treatment is not needed for this condition. With rest, the condition often gets better over time. If treatment is needed, options may include: Wearing a soft neck collar (cervical collar) for short periods of time. Doing exercises (physical therapy) to strengthen your neck muscles. Taking medicines. Having shots (injections) in your spine, in very bad cases. Having surgery. This may be needed if other treatments do not help. The type of surgery that is used will depend on the cause of your condition. Follow these instructions at home: If you have a soft neck collar: Wear it as told by your doctor. Take it off only as told by your doctor. Ask your doctor if you can take the collar off for cleaning and bathing. If you are allowed to take the collar off for cleaning or bathing: Follow instructions from your doctor about how to take off the collar  safely. Clean the collar by wiping it with mild soap and water and drying it completely. Take out any removable pads in the collar every 1-2 days. Wash them by hand with soap and water. Let them air-dry completely before you put them back in the collar. Check your skin under the collar for redness or sores. If you see any, tell your doctor. Managing pain     Take over-the-counter and prescription medicines only as told by your doctor. If told, put ice on the painful area. To do this: If you have a soft neck collar, take if off as told by your doctor. Put ice in a plastic bag. Place a towel between your skin and the bag. Leave the ice on for 20 minutes, 2-3 times a day. Take off the ice if your skin turns bright red. This is very important. If you cannot feel pain, heat, or cold, you have a greater risk of damage to the area. If using ice does not help, you can try using heat. Use the heat source that your doctor recommends, such as a moist heat pack or a heating pad. Place a towel between your skin and the heat source. Leave the heat on for 20-30 minutes. Take off the heat if your skin turns bright red. This is very important. If you cannot feel pain, heat, or cold, you have a greater risk of getting burned. You may try a gentle neck and shoulder rub (massage). Activity Rest as needed. Return  to your normal activities when your doctor says that it is safe. Do exercises as told by your doctor or physical therapist. You may have to avoid lifting. Ask your doctor how much you can safely lift. General instructions Use a flat pillow when you sleep. Do not drive while wearing a soft neck collar. If you do not have a soft neck collar, ask your doctor if it is safe to drive while your neck heals. Ask your doctor if you should avoid driving or using machines while you are taking your medicine. Do not smoke or use any products that contain nicotine or tobacco. If you need help quitting, ask your  doctor. Keep all follow-up visits. Contact a doctor if: Your condition does not get better with treatment. Get help right away if: Your pain gets worse and medicine does not help. You lose feeling or feel weak in your hand, arm, face, or leg. You have a high fever. Your neck is stiff. You cannot control when you poop or pee (have incontinence). You have trouble with walking, balance, or talking. Summary Cervical radiculopathy means that a nerve in the neck is pinched or bruised. A nerve can get pinched from a bulging disk, arthritis, an injury to the neck, or other causes. Symptoms include pain, tingling, or loss of feeling that goes from the neck to the arm or hand. Weakness in your arm or hand can happen in very bad cases. Treatment may include resting, wearing a soft neck collar, and doing exercises. You might need to take medicines for pain. In very bad cases, shots or surgery may be needed. This information is not intended to replace advice given to you by your health care provider. Make sure you discuss any questions you have with your health care provider. Document Revised: 07/24/2020 Document Reviewed: 07/24/2020 Elsevier Patient Education  2024 [employer].

## 2023-01-11 NOTE — Assessment & Plan Note (Signed)
Acute flare of pain.  Will start out with short burst of Prednisone 40 MG daily for 5 days, try to avoid longer period due to Xarelto use -- discussed this with him.  Norco 5-325 MG dosing sent for 5 days.  Discussed plan of care at length with patient.  Has had similar in the past.  No red flags today. Return in one week and if ongoing pain may need to pursue an MRI and referral to neurosurgery.
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
## 2023-01-22 NOTE — Patient Instructions (Signed)
Cervical Radiculopathy  Cervical radiculopathy happens when a nerve in the neck (a cervical nerve) is pinched or bruised. This condition can happen because of an injury to the cervical spine (vertebrae) in the neck, or as part of the normal aging process. Pressure on the cervical nerves can cause pain or numbness that travels from the neck all the way down to the arm and fingers. This condition usually gets better with rest. Treatment may be needed if the condition does not improve. What are the causes? This condition may be caused by: A neck injury. A bulging (herniated) disk. Muscle spasms. Muscle tightness in the neck due to overuse. Arthritis. Breakdown or degeneration in the bones and joints of the spine (spondylosis) due to aging. Bone spurs that may develop near the cervical nerves. What are the signs or symptoms? Symptoms of this condition include: Pain. The pain may travel from the neck to the arm and hand. The pain can be severe or irritating. It may get worse when you move your neck. Numbness or tingling in your arm or hand. Weakness in the affected arm and hand, in severe cases. How is this diagnosed? This condition may be diagnosed based on your symptoms, your medical history, and a physical exam. You may also have tests, including: X-rays. CT scan. MRI. Electromyogram (EMG). Nerve conduction tests. How is this treated? In many cases, treatment is not needed for this condition. With rest, the condition usually gets better over time. If treatment is needed, options may include: Wearing a soft neck collar (cervical collar) for short periods of time. Doing physical therapy to strengthen your neck muscles. Taking medicines. These may include NSAIDs, such as ibuprofen, or oral corticosteroids. Having spinal injections, in severe cases. Having surgery. This may be needed if other treatments do not help. Different types of surgery may be done depending on the cause of this  condition. Follow these instructions at home: If you have a cervical collar: Wear it as told by your health care provider. Remove it only as told by your health care provider. Ask your health care provider if you can remove the cervical collar for cleaning and bathing. If you are allowed to remove the collar for cleaning or bathing: Follow instructions from your health care provider about how to remove the collar safely. Clean the collar by wiping it with mild soap and water and drying it completely. Take out any removable pads in the collar every 1-2 days, and wash them by hand with soap and water. Let them air-dry completely before you put them back in the collar. Check your skin under the collar for irritation or sores. If you see any, tell your health care provider. Managing pain     Take over-the-counter and prescription medicines only as told by your health care provider. If directed, put ice on the affected area. To do this: If you have a soft neck collar, remove it as told by your health care provider. Put ice in a plastic bag. Place a towel between your skin and the bag. Leave the ice on for 20 minutes, 2-3 times a day. Remove the ice if your skin turns bright red. This is very important. If you cannot feel pain, heat, or cold, you have a greater risk of damage to the area. If applying ice does not help, you can try using heat. Use the heat source that your health care provider recommends, such as a moist heat pack or a heating pad. Place a towel between   your skin and the heat source. Leave the heat on for 20-30 minutes. Remove the heat if your skin turns bright red. This is especially important if you are unable to feel pain, heat, or cold. You have a greater risk of getting burned. Try a gentle neck and shoulder massage to help relieve symptoms. Activity Rest as needed. Return to your normal activities as told by your health care provider. Ask your health care provider what  activities are safe for you. Do stretching and strengthening exercises as told by your health care provider or your physical therapist. You may have to avoid lifting. Ask your health care provider how much you can safely lift. General instructions Use a flat pillow when you sleep. Do not drive while wearing a cervical collar. If you do not have a cervical collar, ask your health care provider if it is safe to drive while your neck heals. Ask your health care provider if the medicine prescribed to you requires you to avoid driving or using machinery. Do not use any products that contain nicotine or tobacco. These products include cigarettes, chewing tobacco, and vaping devices, such as e-cigarettes. If you need help quitting, ask your health care provider. Keep all follow-up visits. This is important. Contact a health care provider if: Your condition does not improve with treatment. Get help right away if: Your pain gets much worse and is not controlled with medicines. You have weakness or numbness in your hand, arm, face, or leg. You have a high fever. You have a stiff, rigid neck. You lose control of your bowels or your bladder (have incontinence). You have trouble with walking, balance, or speaking. Summary Cervical radiculopathy happens when a nerve in the neck is pinched or bruised. A nerve can get pinched from a bulging disk, arthritis, muscle spasms, or an injury to the neck. Symptoms include pain, tingling, or numbness radiating from the neck to the arm or hand. Weakness can also occur in severe cases. Treatment may include rest, wearing a cervical collar, and physical therapy. Medicines may be prescribed to help with pain. In severe cases, injections or surgery may be needed. This information is not intended to replace advice given to you by your health care provider. Make sure you discuss any questions you have with your health care provider. Document Revised: 07/24/2020 Document  Reviewed: 07/24/2020 [employer] Patient Education  2024 [employer]  

## 2023-01-24 ENCOUNTER — Ambulatory Visit: Payer: No Typology Code available for payment source | Admitting: Nurse Practitioner

## 2023-01-24 VITALS — BP 120/69 | HR 66 | Temp 97.7°F | Ht 71.0 in | Wt 207.0 lb

## 2023-01-24 DIAGNOSIS — M5030 Other cervical disc degeneration, unspecified cervical region: Secondary | ICD-10-CM

## 2023-01-24 MED ORDER — TIZANIDINE HCL 4 MG PO TABS: 4.0000 mg | ORAL_TABLET | Freq: Four times a day (QID) | ORAL | 0 refills | Status: DC | PRN

## 2023-01-24 MED ORDER — HYDROCODONE-ACETAMINOPHEN 10-325 MG PO TABS: 1.0000 | ORAL_TABLET | ORAL | 0 refills | Status: AC | PRN
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
## 2023-01-24 NOTE — Assessment & Plan Note (Signed)
Chronic with ongoing acute flare that is not improving.  No red flags. Discussed with him at length.  Refills on Norco sent to take as needed, will change to Q4H, plus Tizanidine as needed.  He is aware not to take either while working or driving.  Continue simple treatment regimen at home.  Will obtain MRI, since pain improving and has had previous x-ray imaging.  Referral to physiatry to discuss if injections are an option if ongoing pain.

## 2023-02-04 ENCOUNTER — Ambulatory Visit
Admission: RE | Admit: 2023-02-04 | Discharge: 2023-02-04 | Disposition: A | Discharge status: Home / Self Care | Payer: No Typology Code available for payment source | Source: Ambulatory Visit | Attending: Nurse Practitioner | Admitting: Nurse Practitioner

## 2023-02-04 DIAGNOSIS — M5030 Other cervical disc degeneration, unspecified cervical region: Secondary | ICD-10-CM | POA: Insufficient documentation

## 2023-02-11 NOTE — Progress Notes (Signed)
 Contacted via MyChart but does not consistently check, so please call too:   Good day Dwayne Jacobson, your imaging has returned and you do have some moderate to severe narrowing of spaces and some disc bulges.  I would recommend a neurosurgery referral to get their recommendations if pain continues.  Would you like to visit them?  Let me know.  Any questions? Keep being stellar!!  Thank you for allowing me to participate in your care.  I appreciate you. Kindest regards, Alaija Ruble

## 2023-02-12 NOTE — Patient Instructions (Signed)
Cervical Radiculopathy  Cervical radiculopathy happens when a nerve in the neck (a cervical nerve) is pinched or bruised. This condition can happen because of an injury to the cervical spine (vertebrae) in the neck, or as part of the normal aging process. Pressure on the cervical nerves can cause pain or numbness that travels from the neck all the way down to the arm and fingers. This condition usually gets better with rest. Treatment may be needed if the condition does not improve. What are the causes? This condition may be caused by: A neck injury. A bulging (herniated) disk. Muscle spasms. Muscle tightness in the neck due to overuse. Arthritis. Breakdown or degeneration in the bones and joints of the spine (spondylosis) due to aging. Bone spurs that may develop near the cervical nerves. What are the signs or symptoms? Symptoms of this condition include: Pain. The pain may travel from the neck to the arm and hand. The pain can be severe or irritating. It may get worse when you move your neck. Numbness or tingling in your arm or hand. Weakness in the affected arm and hand, in severe cases. How is this diagnosed? This condition may be diagnosed based on your symptoms, your medical history, and a physical exam. You may also have tests, including: X-rays. CT scan. MRI. Electromyogram (EMG). Nerve conduction tests. How is this treated? In many cases, treatment is not needed for this condition. With rest, the condition usually gets better over time. If treatment is needed, options may include: Wearing a soft neck collar (cervical collar) for short periods of time. Doing physical therapy to strengthen your neck muscles. Taking medicines. These may include NSAIDs, such as ibuprofen, or oral corticosteroids. Having spinal injections, in severe cases. Having surgery. This may be needed if other treatments do not help. Different types of surgery may be done depending on the cause of this  condition. Follow these instructions at home: If you have a cervical collar: Wear it as told by your health care provider. Remove it only as told by your health care provider. Ask your health care provider if you can remove the cervical collar for cleaning and bathing. If you are allowed to remove the collar for cleaning or bathing: Follow instructions from your health care provider about how to remove the collar safely. Clean the collar by wiping it with mild soap and water and drying it completely. Take out any removable pads in the collar every 1-2 days, and wash them by hand with soap and water. Let them air-dry completely before you put them back in the collar. Check your skin under the collar for irritation or sores. If you see any, tell your health care provider. Managing pain     Take over-the-counter and prescription medicines only as told by your health care provider. If directed, put ice on the affected area. To do this: If you have a soft neck collar, remove it as told by your health care provider. Put ice in a plastic bag. Place a towel between your skin and the bag. Leave the ice on for 20 minutes, 2-3 times a day. Remove the ice if your skin turns bright red. This is very important. If you cannot feel pain, heat, or cold, you have a greater risk of damage to the area. If applying ice does not help, you can try using heat. Use the heat source that your health care provider recommends, such as a moist heat pack or a heating pad. Place a towel between   your skin and the heat source. Leave the heat on for 20-30 minutes. Remove the heat if your skin turns bright red. This is especially important if you are unable to feel pain, heat, or cold. You have a greater risk of getting burned. Try a gentle neck and shoulder massage to help relieve symptoms. Activity Rest as needed. Return to your normal activities as told by your health care provider. Ask your health care provider what  activities are safe for you. Do stretching and strengthening exercises as told by your health care provider or your physical therapist. You may have to avoid lifting. Ask your health care provider how much you can safely lift. General instructions Use a flat pillow when you sleep. Do not drive while wearing a cervical collar. If you do not have a cervical collar, ask your health care provider if it is safe to drive while your neck heals. Ask your health care provider if the medicine prescribed to you requires you to avoid driving or using machinery. Do not use any products that contain nicotine or tobacco. These products include cigarettes, chewing tobacco, and vaping devices, such as e-cigarettes. If you need help quitting, ask your health care provider. Keep all follow-up visits. This is important. Contact a health care provider if: Your condition does not improve with treatment. Get help right away if: Your pain gets much worse and is not controlled with medicines. You have weakness or numbness in your hand, arm, face, or leg. You have a high fever. You have a stiff, rigid neck. You lose control of your bowels or your bladder (have incontinence). You have trouble with walking, balance, or speaking. Summary Cervical radiculopathy happens when a nerve in the neck is pinched or bruised. A nerve can get pinched from a bulging disk, arthritis, muscle spasms, or an injury to the neck. Symptoms include pain, tingling, or numbness radiating from the neck to the arm or hand. Weakness can also occur in severe cases. Treatment may include rest, wearing a cervical collar, and physical therapy. Medicines may be prescribed to help with pain. In severe cases, injections or surgery may be needed. This information is not intended to replace advice given to you by your health care provider. Make sure you discuss any questions you have with your health care provider. Document Revised: 07/24/2020 Document  Reviewed: 07/24/2020 [employer] Patient Education  2024 [employer]  

## 2023-02-14 ENCOUNTER — Ambulatory Visit: Payer: No Typology Code available for payment source | Admitting: Nurse Practitioner

## 2023-02-14 ENCOUNTER — Encounter: Payer: Self-pay | Admitting: Nurse Practitioner

## 2023-02-14 VITALS — BP 107/68 | HR 75 | Temp 97.5°F | Wt 207.0 lb

## 2023-02-14 DIAGNOSIS — M5030 Other cervical disc degeneration, unspecified cervical region: Secondary | ICD-10-CM

## 2023-02-14 NOTE — Assessment & Plan Note (Signed)
 Chronic with acute flare improved.  No red flags. Discussed with him at length.  Continue simple treatment regimen at home. Recommend he maintain visit with physiatry to establish care with them in case another flare presents or worsening.
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
## 2023-03-08 NOTE — Progress Notes (Deleted)
 Referring Physician:  Carlisle Benton CROME, FNP [id number] [place],  [place] [address]  Primary Physician:  Dwayne Melanie DASEN, NP  History of Present Illness: 03/08/2023*** Dwayne Jacobson has a history of *** Neck radiating down left arm to fingers.    Duration: *** Location: *** Quality: *** Severity: ***  Precipitating: aggravated by *** Modifying factors: made better by *** Weakness: none Timing: *** Bowel/Bladder Dysfunction: none  Conservative measures:  Physical therapy: *** has participated in PT at [facility] Multimodal medical therapy including regular antiinflammatories: *** Tizanidine , Hydrocodone  Injections: no epidural steroid injections  Past Surgery: ***none  Dwayne Jacobson has ***no symptoms of cervical myelopathy.  The symptoms are causing a significant impact on the patient's life.   Review of Systems:  A 10 point review of systems is negative, except for the pertinent positives and negatives detailed in the HPI.  Past Medical History: Past Medical History:  Diagnosis Date   DVT (deep venous thrombosis) (HCC)    Hyperlipidemia    Hypertension    Seizures (HCC)    Skin cancer    Systemic lupus erythematosus (HCC)     Past Surgical History: Past Surgical History:  Procedure Laterality Date   NO PAST SURGERIES      Allergies: Allergies as of 03/10/2023   (No Known Allergies)    Medications: Outpatient Encounter Medications as of 03/10/2023  Medication Sig   amLODipine  (NORVASC ) 5 MG tablet Take 1 tablet (5 mg total) by mouth daily.   atorvastatin  (LIPITOR) 20 MG tablet Take 1 tablet (20 mg total) by mouth daily.   clobetasol (TEMOVATE) 0.05 % external solution Apply topically as needed.   hydrochlorothiazide  (HYDRODIURIL ) 25 MG tablet Take 0.5 tablets (12.5 mg total) by mouth daily.   hydroxychloroquine (PLAQUENIL) 200 MG tablet Take 200 mg by mouth daily.   lisinopril  (ZESTRIL ) 20 MG tablet Take 1 tablet (20 mg total) by mouth daily.    Multiple Vitamin (MULTIVITAMIN) tablet Take 1 tablet by mouth daily.   omeprazole  (PRILOSEC) 20 MG capsule Take 20 mg by mouth daily.   Polyethyl Glycol-Propyl Glycol (SYSTANE) 0.4-0.3 % SOLN Apply to eye.   rivaroxaban  (XARELTO ) 20 MG TABS tablet Take 1 tablet (20 mg total) by mouth daily with supper.   sildenafil  (VIAGRA ) 100 MG tablet Take 0.5-1 tablets (50-100 mg total) by mouth daily as needed for erectile dysfunction.   tiZANidine  (ZANAFLEX ) 4 MG tablet Take 1 tablet (4 mg total) by mouth every 6 (six) hours as needed for muscle spasms.   No facility-administered encounter medications on file as of 03/10/2023.    Social History: Social History   Tobacco Use   Smoking status: Former    Current packs/day: 0.00    Average packs/day: 1 pack/day for 43.0 years (43.0 ttl pk-yrs)    Types: Cigarettes    Start date: 08/31/1970    Quit date: 08/30/2013    Years since quitting: 9.5    Passive exposure: Never   Smokeless tobacco: Former    Types: Engineer, Drilling   Vaping status: Every Day   Substances: Nicotine  Substance Use Topics   Alcohol use: Yes    Alcohol/week: 8.0 - 10.0 standard drinks of alcohol    Types: 8 - 10 Cans of beer per week   Drug use: Never    Family Medical History: Family History  Problem Relation Age of Onset   Diabetes Mother    Hypertension Mother    Hyperlipidemia Mother    Stroke  Mother    Lung disease Mother    Heart disease Mother    Heart disease Father    Lung disease Father    Diabetes Sister    Colon polyps Maternal Grandmother     Physical Examination: There were no vitals filed for this visit.  General: Patient is well developed, well nourished, calm, collected, and in no apparent distress. Attention to examination is appropriate.  Respiratory: Patient is breathing without any difficulty.   NEUROLOGICAL:     Awake, alert, oriented to person, place, and time.  Speech is clear and fluent. Fund of knowledge is appropriate.    Cranial Nerves: Pupils equal round and reactive to light.  Facial tone is symmetric.    *** ROM of cervical spine *** pain *** posterior cervical tenderness. *** tenderness in bilateral trapezial region.   *** ROM of lumbar spine *** pain *** posterior lumbar tenderness.   No abnormal lesions on exposed skin.   Strength: Side Biceps Triceps Deltoid Interossei Grip Wrist Ext. Wrist Flex.  R 5 5 5 5 5 5 5   L 5 5 5 5 5 5 5    Side Iliopsoas Quads Hamstring PF DF EHL  R 5 5 5 5 5 5   L 5 5 5 5 5 5    Reflexes are ***2+ and symmetric at the biceps, brachioradialis, patella and achilles.   Hoffman's is absent.  Clonus is not present.   Bilateral upper and lower extremity sensation is intact to light touch.     Gait is normal.   ***No difficulty with tandem gait.    Medical Decision Making  Imaging: ***  I have personally reviewed the images and agree with the above interpretation.  Assessment and Plan: Dwayne Jacobson is a pleasant 68 y.o. male has ***  Treatment options discussed with patient and following plan made:   - Order for physical therapy for *** spine ***. Patient to call to schedule appointment. *** - Continue current medications including ***. Reviewed dosing and side effects.  - Prescription for ***. Reviewed dosing and side effects. Take with food.  - Prescription for *** to take prn muscle spasms. Reviewed dosing and side effects. Discussed this can cause drowsiness.  - MRI of *** to further evaluate *** radiculopathy. No improvement time or medications (***).  - Referral to PM[facility] at [facility] to discuss possible *** injections.  - Will schedule phone visit to review MRI results once I get them back.   I spent a total of *** minutes in face-to-face and non-face-to-face activities related to this patient's care today including review of outside records, review of imaging, review of symptoms, physical exam, discussion of differential diagnosis, discussion of treatment options,  and documentation.   Thank you for involving me in the care of this patient.   Glade Boys PA-C Dept. of Neurosurgery

## 2023-03-10 ENCOUNTER — Ambulatory Visit: Payer: No Typology Code available for payment source | Admitting: Orthopedic Surgery

## 2023-03-11 NOTE — Progress Notes (Signed)
 Office Visit Note  Patient: Dwayne Jacobson             Date of Birth: August 11, 1955           MRN: [id number]             PCP: Marjie Skiff, NP Referring: Marjie Skiff, NP Visit Date: 03/25/2023 Occupation: @GUAROCC @  Subjective:  Neck stiffness  History of Present Illness: Dwayne Jacobson is a 68 y.o. male with systemic lupus.  He returns today after his last visit on October 15, 2022.  He denies having a flare of systemic lupus.  He denies history of oral ulcers, nasal ulcers, malar rash, photosensitivity, Raynaud's, lymphadenopathy or inflammatory arthritis.  He continues to be on hydroxychloroquine 200 mg p.o. daily without any interruption.  Patient states that he has been having pain and stiffness in his neck almost for a year.  He has had physical therapy in the past.  Recently the pain has become more intense and has been radiating into his left lower extremity.  He was evaluated by neurosurgery.  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  He has an appointment coming up with the neurosurgeon to evaluate for possible surgery.  Activities of Daily Living:  Patient reports morning stiffness for a few minutes.   Patient Denies nocturnal pain.  Difficulty dressing/grooming: Denies Difficulty climbing stairs: Denies Difficulty getting out of chair: Denies Difficulty using hands for taps, buttons, cutlery, and/or writing: Denies  Review of Systems  Constitutional:  Negative for fatigue.  HENT:  Negative for mouth sores and mouth dryness.   Eyes:  Negative for dryness.  Respiratory:  Negative for shortness of breath.   Cardiovascular:  Negative for chest pain and palpitations.  Gastrointestinal:  Negative for blood in stool, constipation and diarrhea.  Endocrine: Negative for increased urination.  Genitourinary:  Negative for involuntary urination.  Musculoskeletal:  Positive for  myalgias, morning stiffness and myalgias. Negative for joint pain, gait problem, joint pain, joint swelling, muscle weakness and muscle tenderness.  Skin:  Negative for color change, rash and sensitivity to sunlight.  Allergic/Immunologic: Negative for susceptible to infections.  Neurological:  Negative for dizziness and headaches.  Hematological:  Negative for swollen glands.  Psychiatric/Behavioral:  Negative for depressed mood and sleep disturbance. The patient is not nervous/anxious.     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   Current every day nicotine vaping 10/31/2020   Healthcare maintenance 09/19/2020   Gastroesophageal reflux disease without esophagitis 08/17/2019   Chondrocalcinosis 01/13/2017   Primary osteoarthritis of both knees 01/13/2017   History of DVT (deep vein thrombosis)    Hypertension    Hyperlipidemia    SLE (systemic lupus erythematosus) (HCC) 01/18/2014    Past Medical History:  Diagnosis Date   DVT (deep venous thrombosis) (HCC)    Hyperlipidemia    Hypertension    Seizures (HCC)    Skin cancer    Systemic lupus erythematosus (HCC)     Family History  Problem Relation Age of Onset   Diabetes Mother    Hypertension Mother    Hyperlipidemia Mother    Stroke Mother    Lung disease Mother  Heart disease Mother    Heart disease Father    Lung disease Father    Diabetes Sister    Colon polyps Maternal Grandmother    Past Surgical History:  Procedure Laterality Date   NO PAST SURGERIES     Social History   Social History Narrative   Not on file   Immunization History  Administered Date(s) Administered   Fluad Quad(high Dose 65+) 12/05/2020   Influenza,inj,Quad PF,6+ Mos  01/03/2015, 11/05/2016, 11/07/2018   PNEUMOCOCCAL CONJUGATE-20 09/25/2021   Pneumococcal Polysaccharide-23 09/29/2012   Td 03/25/2006, 08/06/2016     Objective: Vital Signs: BP 136/84 (BP Location: Left Arm, Patient Position: Sitting, Cuff Size: Normal)   Pulse (!) 59   Resp 14   Ht 5\' 11"  (1.803 m)   Wt 201 lb 12.8 oz (91.5 kg)   BMI 28.15 kg/m    Physical Exam Vitals and nursing note reviewed.  Constitutional:      Appearance: He is well-developed.  HENT:     Head: Normocephalic and atraumatic.  Eyes:     Conjunctiva/sclera: Conjunctivae normal.     Pupils: Pupils are equal, round, and reactive to light.  Cardiovascular:     Rate and Rhythm: Normal rate and regular rhythm.     Heart sounds: Normal heart sounds.  Pulmonary:     Effort: Pulmonary effort is normal.     Breath sounds: Normal breath sounds.  Abdominal:     General: Bowel sounds are normal.     Palpations: Abdomen is soft.  Musculoskeletal:     Cervical back: Normal range of motion and neck supple.  Skin:    General: Skin is warm and dry.     Capillary Refill: Capillary refill takes less than 2 seconds.  Neurological:     Mental Status: He is alert and oriented to person, place, and time.  Psychiatric:        Behavior: Behavior normal.      Musculoskeletal Exam: He had limited painful range of motion of the cervical spine.  There was no tenderness over thoracic or lumbar spine.  Shoulders, elbows, wrist, MCPs PIPs and DIPs with good range of motion with no synovitis.  Bilateral CMC, PIP and DIP thickening was noted.  Hip joints and knee joints in good range of motion.  There was no tenderness over ankles or MTPs.  CDAI Exam: CDAI Score: -- Patient Global: --; Provider Global: -- Swollen: --; Tender: -- Joint Exam 03/25/2023   No joint exam has been documented for this visit   There is currently no information documented on the homunculus. Go to the Rheumatology activity and complete the homunculus  joint exam.  Investigation: No additional findings.  Imaging: No results found.  Recent Labs: Lab Results  Component Value Date   WBC 5.9 09/29/2022   HGB 13.3 09/29/2022   PLT 305 09/29/2022   NA 137 09/29/2022   K 3.8 09/29/2022   CL 98 09/29/2022   CO2 22 09/29/2022   GLUCOSE 89 09/29/2022   BUN 10 09/29/2022   CREATININE 0.78 09/29/2022   BILITOT 0.3 09/29/2022   ALKPHOS 79 09/29/2022   AST 27 09/29/2022   ALT 32 09/29/2022   PROT 7.3 09/29/2022   ALBUMIN 4.7 09/29/2022   CALCIUM 9.6 09/29/2022   GFRAA 103 03/21/2020    Speciality Comments: PLQ Eye Exam: 03/04/2023 WNL @ [facility] Follow up in 1 year   Called to request updated PLQ eye exam.  Procedures:  No procedures performed Allergies:  Patient has no known allergies.   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 History of rash, diagnosed with lupus by Dr. Gavin Potters many years ago: -He has not had a flare of systemic lupus.  He denies history of oral ulcers, nasal ulcers, malar rash, photosensitivity, Raynaud's or lymphadenopathy.  Labs obtained on September 29, 2022 CBC and CMP were normal.  October 15, 2022 double-stranded DNA was negative complements were normal and sed rate was normal.  Will check labs today.  Use of sunscreen was emphasized.  Plan: Protein / creatinine ratio, urine, ANA, Anti-DNA antibody, double-stranded, C3 and C4, Sedimentation rate  High risk medication use - Plaquenil 200 mg 1 tablet by mouth daily-prescribed by Dr. Pilar Plate (dermatologist).PLQ Eye Exam: 02/19/2022 -patient states he had an eye examination in January 2025.  Will get records.  Plan: CBC with Differential/Platelet, COMPLETE METABOLIC PANEL WITH GFR and every 5 months.  Annual eye examination was advised.  Information on immunization was placed in the AVS.  Trapezius muscle spasm-he continue to have trapezius spasm  related to disc disease.  Primary osteoarthritis of both knees -he denies any discomfort today.  He had good range of motion of both knee joints without any warmth swelling or effusion.  X-rays in 2018 showed moderate osteoarthritis.  Chondrocalcinosis-he denies any recent flares.  DDD (degenerative disc disease), cervical-he has been having increased neck pain with left-sided radiculopathy.  He has known history of severe spinal stenosis.  He has an appointment coming up with a neurosurgeon.  Patient states he tried physical therapy which failed.  Essential hypertension-blood pressure was normal at 136/84.  Mixed hyperlipidemia  History of DVT (deep vein thrombosis) - in 2001.  He is on long-term Xarelto.  Anticardiolipin, beta-2 GP 1 and lupus anticoagulant were negative when tested in July 2022.  Orders: Orders Placed This Encounter  Procedures   Protein / creatinine ratio, urine   CBC with Differential/Platelet   COMPLETE METABOLIC PANEL WITH GFR   ANA   Anti-DNA antibody, double-stranded   C3 and C4   Sedimentation rate   No orders of the defined types were placed in this encounter.    Follow-Up Instructions: Return in about 5 months (around 08/22/2023) for Systemic lupus.   Pollyann Savoy, MD  Note - This record has been created using Animal nutritionist.  Chart creation errors have been sought, but may not always  have been located. Such creation errors do not reflect on  the standard of medical care.

## 2023-03-15 NOTE — Progress Notes (Signed)
 Referring Physician:  Denton Lank, FNP [address] [place],  [place] [address]  Primary Physician:  Marjie Skiff, NP  History of Present Illness: 03/22/2023 Dwayne Jacobson has a history of HTN, emphysema, GERD, history of DVT, hyperlipidemia, SLE.  He saw [facility] on 02/25/23 for left sided neck and arm pain.  Referred to [place] for cervical spinal stenosis.   Currently he has some stiffness in his neck. He's had two bad flare ups of pain since onset. He is not in a flare up now.   During a flare up, he has more constant neck pain with left arm pain to his fingers. No right arm pain. He's had numbness and tingling in left hand.no weakness. Pain is worse with driving and turning his head.   He has no dexterity issues. No balance issues.    He is on XARELTO. He ihas norco and zanaflex. Zanaflex did not help.   He vapes with nicotine daily.  Bowel/Bladder Dysfunction: none  Conservative measures:  Physical therapy: has participated in PT at [facility], summer of 2024, PT did help, he was discharged on 08/17/22 Multimodal medical therapy including regular antiinflammatories:  Tizanidine, Hydrocodone Injections: no epidural steroid injections  Past Surgery: none  Dwayne Jacobson has no symptoms of cervical myelopathy.  The symptoms are causing a significant impact on the patient's life.   Review of Systems:  A 10 point review of systems is negative, except for the pertinent positives and negatives detailed in the HPI.  Past Medical History: Past Medical History:  Diagnosis Date   DVT (deep venous thrombosis) (HCC)    Hyperlipidemia    Hypertension    Seizures (HCC)    Skin cancer    Systemic lupus erythematosus (HCC)     Past Surgical History: Past Surgical History:  Procedure Laterality Date   NO PAST SURGERIES      Allergies: Allergies as of 03/22/2023   (No Known Allergies)    Medications: Outpatient Encounter Medications as of 03/22/2023   Medication Sig   amLODipine (NORVASC) 5 MG tablet Take 1 tablet (5 mg total) by mouth daily.   atorvastatin (LIPITOR) 20 MG tablet Take 1 tablet (20 mg total) by mouth daily.   clobetasol (TEMOVATE) 0.05 % external solution Apply topically as needed.   hydrochlorothiazide (HYDRODIURIL) 25 MG tablet Take 0.5 tablets (12.5 mg total) by mouth daily.   HYDROcodone-acetaminophen (NORCO/VICODIN) 5-325 MG tablet Take 1 tablet by mouth every 6 (six) hours as needed.   hydroxychloroquine (PLAQUENIL) 200 MG tablet Take 200 mg by mouth daily.   lisinopril (ZESTRIL) 20 MG tablet Take 1 tablet (20 mg total) by mouth daily.   Multiple Vitamin (MULTIVITAMIN) tablet Take 1 tablet by mouth daily.   omeprazole (PRILOSEC) 20 MG capsule Take 20 mg by mouth daily.   Polyethyl Glycol-Propyl Glycol (SYSTANE) 0.4-0.3 % SOLN Apply to eye.   rivaroxaban (XARELTO) 20 MG TABS tablet Take 1 tablet (20 mg total) by mouth daily with supper.   sildenafil (VIAGRA) 100 MG tablet Take 0.5-1 tablets (50-100 mg total) by mouth daily as needed for erectile dysfunction.   tiZANidine (ZANAFLEX) 4 MG tablet Take 1 tablet (4 mg total) by mouth every 6 (six) hours as needed for muscle spasms.   No facility-administered encounter medications on file as of 03/22/2023.    Social History: Social History   Tobacco Use   Smoking status: Former    Current packs/day: 0.00    Average packs/day: 1 pack/day for 43.0  years (43.0 ttl pk-yrs)    Types: Cigarettes    Start date: 08/31/1970    Quit date: 08/30/2013    Years since quitting: 9.5    Passive exposure: Never   Smokeless tobacco: Former    Types: Engineer, drilling   Vaping status: Every Day   Substances: Nicotine  Substance Use Topics   Alcohol use: Yes    Alcohol/week: 8.0 - 10.0 standard drinks of alcohol    Types: 8 - 10 Cans of beer per week   Drug use: Never    Family Medical History: Family History  Problem Relation Age of Onset   Diabetes Mother    Hypertension  Mother    Hyperlipidemia Mother    Stroke Mother    Lung disease Mother    Heart disease Mother    Heart disease Father    Lung disease Father    Diabetes Sister    Colon polyps Maternal Grandmother     Physical Examination: Vitals:   03/22/23 1058  BP: 126/80    General: Patient is well developed, well nourished, calm, collected, and in no apparent distress. Attention to examination is appropriate.  Respiratory: Patient is breathing without any difficulty.   NEUROLOGICAL:     Awake, alert, oriented to person, place, and time.  Speech is clear and fluent. Fund of knowledge is appropriate.   Cranial Nerves: Pupils equal round and reactive to light.  Facial tone is symmetric.    No posterior cervical tenderness. No tenderness in bilateral trapezial region.   No abnormal lesions on exposed skin.   Strength: Side Biceps Triceps Deltoid Interossei Grip Wrist Ext. Wrist Flex.  R 5 5 5 5 5 5 5   L 5 5 5 5 5 5 5    Side Iliopsoas Quads Hamstring PF DF EHL  R 5 5 5 5 5 5   L 5 5 5 5 5 5    Reflexes are 2+ and symmetric at the biceps, brachioradialis, patella and achilles.   Hoffman's is absent.  Clonus is not present.   Bilateral upper and lower extremity sensation is intact to light touch.     Good ROM of both shoulders with no pain.   Gait is normal.     Medical Decision Making  Imaging: MRI cervical spine dated 02/04/23:  FINDINGS: Alignment: Straightening of the normal cervical lordosis. Trace retrolisthesis C4 on C5.   Vertebrae: No fracture, evidence of discitis, or bone lesion. T2 hyperintense signal in the C5-C6 disc space is favored to be degenerative.   Cord: Normal signal and morphology.   Posterior Fossa, vertebral arteries, paraspinal tissues: Negative.   Disc levels:   C1-C2: No significant degenerative change.   C2-C3: Mild bilateral facet degenerative change. No spinal canal narrowing. No neural foraminal narrowing.   C3-C4: Mild bilateral  facet degenerative change. No spinal canal narrowing. No neural foraminal narrowing.   C4-C5: Circumferential disc bulge. Moderate to severe spinal canal narrowing. Uncovertebral hypertrophy. Moderate left and mild right facet degenerative change. Severe left and moderate right neural foraminal narrowing.   C5-C6: Circumferential disc bulge. Moderate to severe spinal canal narrowing. Uncovertebral hypertrophy. Moderate to severe bilateral neural foraminal narrowing.   C6-C7: Minimal disc bulge. Uncovertebral hypertrophy. Mild bilateral facet degenerative change. Moderate bilateral neural foraminal narrowing.   C7-T1: Mild bilateral facet degenerative change. No spinal canal or neural foraminal narrowing.   IMPRESSION: Moderate to severe spinal canal and bilateral neuroforaminal narrowing at C4-C5 and C5-C6.     Electronically Signed  By: Lorenza Cambridge M.D.   On: 02/11/2023 11:34   Xrays of cervical spine dated 05/26/22:  FINDINGS: On the lateral view the cervical spine is visualized to the level of C6-7 with improved visualization of C7-T1 on the swimmer's view. Straightening of the cervical spine. Pre-vertebral soft tissues are within normal limits. No fracture is detected in the cervical spine. Dens is well positioned between the lateral masses of C1. Moderate multilevel degenerative disc disease in the mid to lower cervical spine, most prominent at C6-7. No cervical spine subluxation. Mild bilateral cervical facet arthropathy. Suggestion of mild degenerative foraminal stenosis on the left at C6-7. No aggressive-appearing focal osseous lesions.   IMPRESSION: 1. Moderate multilevel degenerative disc disease in the mid to lower cervical spine, most prominent at C6-7. 2. Mild bilateral cervical facet arthropathy. 3. Suggestion of mild degenerative foraminal stenosis on the left at C6-7.     Electronically Signed   By: Delbert Phenix M.D.   On: 05/30/2022 12:19   I  have personally reviewed the images and agree with the above interpretation.  Assessment and Plan: Dwayne Jacobson has some stiffness in his neck. He's had two bad flare ups of pain since onset. He is not in a flare up now.   During a flare up, he has more constant neck pain with left arm pain to his fingers. No right arm pain. He's had numbness and tingling in left hand. No weakness.   He has known severe central stenosis C4-C5 with severe left and moderate right foraminal stenosis, moderate/severe central stenosis C5-C6 with moderate/severe bilateral foraminal stenosis, and moderate bilateral foraminal stenosis at C6-C7.   He has no dexterity issues. No balance issues. No signs of cervical myelopathy on exam.   Treatment options discussed with patient and following plan made:   - We discussed cervical spinal stenosis at length. Even though he has no current symptoms, I still recommend he follow up with Dr. Katrinka Blazing to review treatment options.  - Will get cervical xrays with flex/ext prior to this visit.  - He asks about cervical injections. Would hold on these until he sees Dr. Katrinka Blazing.  - Of note, he does vape daily with nicotine.  - He will follow up as scheduled with Dr. Katrinka Blazing.   I spent a total of 30 minutes in face-to-face and non-face-to-face activities related to this patient's care today including review of outside records, review of imaging, review of symptoms, physical exam, discussion of differential diagnosis, discussion of treatment options, and documentation.   Thank you for involving me in the care of this patient.   Drake Leach PA-C Dept. of Neurosurgery

## 2023-03-22 ENCOUNTER — Ambulatory Visit
Admission: RE | Admit: 2023-03-22 | Discharge: 2023-03-22 | Disposition: A | Discharge status: Home / Self Care | Payer: No Typology Code available for payment source | Attending: Orthopedic Surgery | Admitting: Orthopedic Surgery

## 2023-03-22 ENCOUNTER — Encounter: Payer: Self-pay | Admitting: Orthopedic Surgery

## 2023-03-22 ENCOUNTER — Ambulatory Visit: Payer: No Typology Code available for payment source | Admitting: Orthopedic Surgery

## 2023-03-22 ENCOUNTER — Ambulatory Visit
Admission: RE | Admit: 2023-03-22 | Discharge: 2023-03-22 | Disposition: A | Discharge status: Home / Self Care | Payer: No Typology Code available for payment source | Source: Ambulatory Visit | Attending: Orthopedic Surgery | Admitting: Orthopedic Surgery

## 2023-03-22 VITALS — BP 126/80 | Ht 71.0 in | Wt 202.0 lb

## 2023-03-22 DIAGNOSIS — M5412 Radiculopathy, cervical region: Secondary | ICD-10-CM

## 2023-03-22 DIAGNOSIS — M4802 Spinal stenosis, cervical region: Secondary | ICD-10-CM

## 2023-03-22 DIAGNOSIS — M47812 Spondylosis without myelopathy or radiculopathy, cervical region: Secondary | ICD-10-CM

## 2023-03-22 DIAGNOSIS — M50123 Cervical disc disorder at C6-C7 level with radiculopathy: Secondary | ICD-10-CM

## 2023-03-22 NOTE — Patient Instructions (Signed)
 It was so nice to see you today. Thank you so much for coming in.    You have some wear and tear in your neck (arthritis). You also have spinal stenosis in your neck (pressure on the spinal cord) and this is what I am most worried about. We know when you have this that it can cause problems with using your hands and problems with walking/balance.   I want you to see Dr. Katrinka Blazing to discuss further treatment options.   Prior to seeing him, I want you to get some additional neck xrays. You can get these at [facility] (building with the white pillars) off of [address]. The address is [address] [place], [place] [address]. You do not need any appointment.   I would hold on any injections in your neck until you see Dr. Katrinka Blazing.   Please do not hesitate to call if you have any questions or concerns. You can also message me in MyChart.   Drake Leach PA-C [phone]     The physicians and staff at [facility] at [facility] are committed to providing excellent care. You may receive a survey asking for feedback about your experience at our office. We value you your feedback and appreciate you taking the time to to fill it out. The [facility] leadership team is also available to discuss your experience in person, feel free to contact us [phone].

## 2023-03-25 ENCOUNTER — Encounter: Payer: Self-pay | Admitting: Rheumatology

## 2023-03-25 ENCOUNTER — Ambulatory Visit: Payer: No Typology Code available for payment source | Attending: Rheumatology | Admitting: Rheumatology

## 2023-03-25 VITALS — BP 136/84 | HR 59 | Resp 14 | Ht 71.0 in | Wt 201.8 lb

## 2023-03-25 DIAGNOSIS — Z86718 Personal history of other venous thrombosis and embolism: Secondary | ICD-10-CM

## 2023-03-25 DIAGNOSIS — M3219 Other organ or system involvement in systemic lupus erythematosus: Secondary | ICD-10-CM

## 2023-03-25 DIAGNOSIS — Z79899 Other long term (current) drug therapy: Secondary | ICD-10-CM

## 2023-03-25 DIAGNOSIS — M170 Bilateral primary osteoarthritis of knee: Secondary | ICD-10-CM | POA: Diagnosis not present

## 2023-03-25 DIAGNOSIS — M1120 Other chondrocalcinosis, unspecified site: Secondary | ICD-10-CM

## 2023-03-25 DIAGNOSIS — I10 Essential (primary) hypertension: Secondary | ICD-10-CM

## 2023-03-25 DIAGNOSIS — M62838 Other muscle spasm: Secondary | ICD-10-CM

## 2023-03-25 DIAGNOSIS — E782 Mixed hyperlipidemia: Secondary | ICD-10-CM

## 2023-03-25 DIAGNOSIS — M5030 Other cervical disc degeneration, unspecified cervical region: Secondary | ICD-10-CM

## 2023-03-25 NOTE — Patient Instructions (Signed)

## 2023-03-27 LAB — CBC WITH DIFFERENTIAL/PLATELET
Absolute Lymphocytes: 1159 {cells}/uL (ref 850–3900)
Absolute Monocytes: 665 {cells}/uL (ref 200–950)
Basophils Absolute: 31 {cells}/uL (ref 0–200)
Basophils Relative: 0.5 %
Eosinophils Absolute: 232 {cells}/uL (ref 15–500)
Eosinophils Relative: 3.8 %
HCT: 40.8 % (ref 38.5–50.0)
Hemoglobin: 13.7 g/dL (ref 13.2–17.1)
MCH: 31.4 pg (ref 27.0–33.0)
MCHC: 33.6 g/dL (ref 32.0–36.0)
MCV: 93.4 fL (ref 80.0–100.0)
MPV: 10.1 fL (ref 7.5–12.5)
Monocytes Relative: 10.9 %
Neutro Abs: 4014 {cells}/uL (ref 1500–7800)
Neutrophils Relative %: 65.8 %
Platelets: 272 10*3/uL (ref 140–400)
RBC: 4.37 10*6/uL (ref 4.20–5.80)
RDW: 12.1 % (ref 11.0–15.0)
Total Lymphocyte: 19 %
WBC: 6.1 10*3/uL (ref 3.8–10.8)

## 2023-03-27 LAB — ANA: Anti Nuclear Antibody (ANA): POSITIVE — AB

## 2023-03-27 LAB — COMPLETE METABOLIC PANEL WITH GFR
AG Ratio: 1.8 (calc) (ref 1.0–2.5)
ALT: 22 U/L (ref 9–46)
AST: 21 U/L (ref 10–35)
Albumin: 4.7 g/dL (ref 3.6–5.1)
Alkaline phosphatase (APISO): 78 U/L (ref 35–144)
BUN: 11 mg/dL (ref 7–25)
CO2: 28 mmol/L (ref 20–32)
Calcium: 10 mg/dL (ref 8.6–10.3)
Chloride: 99 mmol/L (ref 98–110)
Creat: 0.87 mg/dL (ref 0.70–1.35)
Globulin: 2.6 g/dL (ref 1.9–3.7)
Glucose, Bld: 81 mg/dL (ref 65–99)
Potassium: 4.3 mmol/L (ref 3.5–5.3)
Sodium: 137 mmol/L (ref 135–146)
Total Bilirubin: 0.4 mg/dL (ref 0.2–1.2)
Total Protein: 7.3 g/dL (ref 6.1–8.1)
eGFR: 95 mL/min/{1.73_m2} (ref >=60)

## 2023-03-27 LAB — PROTEIN / CREATININE RATIO, URINE
Creatinine, Urine: 86 mg/dL (ref 20–320)
Protein/Creat Ratio: 58 mg/g{creat} (ref 25–148)
Protein/Creatinine Ratio: 0.058 mg/mg{creat} (ref 0.025–0.148)
Total Protein, Urine: 5 mg/dL (ref 5–25)

## 2023-03-27 LAB — C3 AND C4
C3 Complement: 141 mg/dL (ref 82–185)
C4 Complement: 27 mg/dL (ref 15–53)

## 2023-03-27 LAB — SEDIMENTATION RATE: Sed Rate: 14 mm/h (ref 0–20)

## 2023-03-27 LAB — ANTI-DNA ANTIBODY, DOUBLE-STRANDED: ds DNA Ab: 4 [IU]/mL

## 2023-03-27 LAB — ANTI-NUCLEAR AB-TITER (ANA TITER): ANA Titer 1: 1:40 {titer} — ABNORMAL HIGH

## 2023-03-27 NOTE — Patient Instructions (Signed)
 Be Involved in Caring For Your Health:  Taking Medications When medications are taken as directed, they can greatly improve your health. But if they are not taken as prescribed, they may not work. In some cases, not taking them correctly can be harmful. To help ensure your treatment remains effective and safe, understand your medications and how to take them. Bring your medications to each visit for review by your provider.  Your lab results, notes, and after visit summary will be available on My Chart. We strongly encourage you to use this feature. If lab results are abnormal the clinic will contact you with the appropriate steps. If the clinic does not contact you assume the results are satisfactory. You can always view your results on My Chart. If you have questions regarding your health or results, please contact the clinic during office hours. You can also ask questions on My Chart.  We at [facility] are grateful that you chose [place] to provide your care. We strive to provide evidence-based and compassionate care and are always looking for feedback. If you get a survey from the clinic please complete this so we can hear your opinions.  Heart-Healthy Eating Plan Many factors influence your heart health, including eating and exercise habits. Heart health is also called coronary health. Coronary risk increases with abnormal blood fat (lipid) levels. A heart-healthy eating plan includes limiting unhealthy fats, increasing healthy fats, limiting salt (sodium) intake, and making other diet and lifestyle changes. What is my plan? Your health care provider may recommend that: You limit your fat intake to _________% or less of your total calories each day. You limit your saturated fat intake to _________% or less of your total calories each day. You limit the amount of cholesterol in your diet to less than _________ mg per day. You limit the amount of sodium in your diet to less than _________  mg per day. What are tips for following this plan? Cooking Cook foods using methods other than frying. Baking, boiling, grilling, and broiling are all good options. Other ways to reduce fat include: Removing the skin from poultry. Removing all visible fats from meats. Steaming vegetables in water or broth. Meal planning  At meals, imagine dividing your plate into fourths: Fill one-half of your plate with vegetables and green salads. Fill one-fourth of your plate with whole grains. Fill one-fourth of your plate with lean protein foods. Eat 2-4 cups of vegetables per day. One cup of vegetables equals 1 cup (91 g) broccoli or cauliflower florets, 2 medium carrots, 1 large bell pepper, 1 large sweet potato, 1 large tomato, 1 medium white potato, 2 cups (150 g) raw leafy greens. Eat 1-2 cups of fruit per day. One cup of fruit equals 1 small apple, 1 large banana, 1 cup (237 g) mixed fruit, 1 large orange,  cup (82 g) dried fruit, 1 cup (240 mL) 100% fruit juice. Eat more foods that contain soluble fiber. Examples include apples, broccoli, carrots, beans, peas, and barley. Aim to get 25-30 g of fiber per day. Increase your consumption of legumes, nuts, and seeds to 4-5 servings per week. One serving of dried beans or legumes equals  cup (90 g) cooked, 1 serving of nuts is  oz (12 almonds, 24 pistachios, or 7 walnut halves), and 1 serving of seeds equals  oz (8 g). Fats Choose healthy fats more often. Choose monounsaturated and polyunsaturated fats, such as olive and canola oils, avocado oil, flaxseeds, walnuts, almonds, and seeds. Eat  more omega-3 fats. Choose salmon, mackerel, sardines, tuna, flaxseed oil, and ground flaxseeds. Aim to eat fish at least 2 times each week. Check food labels carefully to identify foods with trans fats or high amounts of saturated fat. Limit saturated fats. These are found in animal products, such as meats, butter, and cream. Plant sources of saturated fats  include palm oil, palm kernel oil, and coconut oil. Avoid foods with partially hydrogenated oils in them. These contain trans fats. Examples are stick margarine, some tub margarines, cookies, crackers, and other baked goods. Avoid fried foods. General information Eat more home-cooked food and less restaurant, buffet, and fast food. Limit or avoid alcohol. Limit foods that are high in added sugar and simple starches such as foods made using white refined flour (white breads, pastries, sweets). Lose weight if you are overweight. Losing just 5-10% of your body weight can help your overall health and prevent diseases such as diabetes and heart disease. Monitor your sodium intake, especially if you have high blood pressure. Talk with your health care provider about your sodium intake. Try to incorporate more vegetarian meals weekly. What foods should I eat? Fruits All fresh, canned (in natural juice), or frozen fruits. Vegetables Fresh or frozen vegetables (raw, steamed, roasted, or grilled). Green salads. Grains Most grains. Choose whole wheat and whole grains most of the time. Rice and pasta, including brown rice and pastas made with whole wheat. Meats and other proteins Lean, well-trimmed beef, veal, pork, and lamb. Chicken and [place] without skin. All fish and shellfish. Wild duck, rabbit, pheasant, and venison. Egg whites or low-cholesterol egg substitutes. Dried beans, peas, lentils, and tofu. Seeds and most nuts. Dairy Low-fat or nonfat cheeses, including ricotta and mozzarella. Skim or 1% milk (liquid, powdered, or evaporated). Buttermilk made with low-fat milk. Nonfat or low-fat yogurt. Fats and oils Non-hydrogenated (trans-free) margarines. Vegetable oils, including soybean, sesame, sunflower, olive, avocado, peanut, safflower, corn, canola, and cottonseed. Salad dressings or mayonnaise made with a vegetable oil. Beverages Water (mineral or sparkling). Coffee and tea. Unsweetened ice  tea. Diet beverages. Sweets and desserts Sherbet, gelatin, and fruit ice. Small amounts of dark chocolate. Limit all sweets and desserts. Seasonings and condiments All seasonings and condiments. The items listed above may not be a complete list of foods and beverages you can eat. Contact a dietitian for more options. What foods should I avoid? Fruits Canned fruit in heavy syrup. Fruit in cream or butter sauce. Fried fruit. Limit coconut. Vegetables Vegetables cooked in cheese, cream, or butter sauce. Fried vegetables. Grains Breads made with saturated or trans fats, oils, or whole milk. Croissants. Sweet rolls. Donuts. High-fat crackers, such as cheese crackers and chips. Meats and other proteins Fatty meats, such as hot dogs, ribs, sausage, bacon, rib-eye roast or steak. High-fat deli meats, such as salami and bologna. Caviar. Domestic duck and goose. Organ meats, such as liver. Dairy Cream, sour cream, cream cheese, and creamed cottage cheese. Whole-milk cheeses. Whole or 2% milk (liquid, evaporated, or condensed). Whole buttermilk. Cream sauce or high-fat cheese sauce. Whole-milk yogurt. Fats and oils Meat fat, or shortening. Cocoa butter, hydrogenated oils, palm oil, coconut oil, palm kernel oil. Solid fats and shortenings, including bacon fat, salt pork, lard, and butter. Nondairy cream substitutes. Salad dressings with cheese or sour cream. Beverages Regular sodas and any drinks with added sugar. Sweets and desserts Frosting. Pudding. Cookies. Cakes. Pies. Milk chocolate or white chocolate. Buttered syrups. Full-fat ice cream or ice cream drinks. The items listed above may  not be a complete list of foods and beverages to avoid. Contact a dietitian for more information. Summary Heart-healthy meal planning includes limiting unhealthy fats, increasing healthy fats, limiting salt (sodium) intake and making other diet and lifestyle changes. Lose weight if you are overweight. Losing just  5-10% of your body weight can help your overall health and prevent diseases such as diabetes and heart disease. Focus on eating a balance of foods, including fruits and vegetables, low-fat or nonfat dairy, lean protein, nuts and legumes, whole grains, and heart-healthy oils and fats. This information is not intended to replace advice given to you by your health care provider. Make sure you discuss any questions you have with your health care provider. Document Revised: 02/23/2021 Document Reviewed: 02/23/2021 Elsevier Patient Education  2024 [employer].

## 2023-03-28 NOTE — Progress Notes (Signed)
 ANA is low titer positive and not significant.  Urine protein creatinine ratio normal, CBC and CMP normal, double-stranded DNA negative, complements normal, sed rate normal.

## 2023-04-01 ENCOUNTER — Ambulatory Visit: Payer: Self-pay | Admitting: Nurse Practitioner

## 2023-04-01 ENCOUNTER — Encounter: Payer: Self-pay | Admitting: Nurse Practitioner

## 2023-04-01 VITALS — BP 129/65 | HR 71 | Temp 97.8°F | Ht 71.0 in | Wt 205.0 lb

## 2023-04-01 DIAGNOSIS — I700 Atherosclerosis of aorta: Secondary | ICD-10-CM | POA: Diagnosis not present

## 2023-04-01 DIAGNOSIS — J432 Centrilobular emphysema: Secondary | ICD-10-CM | POA: Diagnosis not present

## 2023-04-01 DIAGNOSIS — E782 Mixed hyperlipidemia: Secondary | ICD-10-CM

## 2023-04-01 DIAGNOSIS — M3219 Other organ or system involvement in systemic lupus erythematosus: Secondary | ICD-10-CM | POA: Diagnosis not present

## 2023-04-01 DIAGNOSIS — D6869 Other thrombophilia: Secondary | ICD-10-CM | POA: Diagnosis not present

## 2023-04-01 DIAGNOSIS — Z86718 Personal history of other venous thrombosis and embolism: Secondary | ICD-10-CM

## 2023-04-01 DIAGNOSIS — Z720 Tobacco use: Secondary | ICD-10-CM

## 2023-04-01 DIAGNOSIS — I10 Essential (primary) hypertension: Secondary | ICD-10-CM

## 2023-04-01 DIAGNOSIS — M5030 Other cervical disc degeneration, unspecified cervical region: Secondary | ICD-10-CM

## 2023-04-01 NOTE — Assessment & Plan Note (Signed)
 Chronic, stable on Xarelto.  Hematology consult in 2020 and was able to transition to Xarelto, which he has taken without issue and has offered benefit to lifestyle.  CMP up to date.

## 2023-04-01 NOTE — Assessment & Plan Note (Signed)
 Chronic, stable. Note on lung cancer screening 06/18/22.  Educated him on this finding.  Recommend he cut back on vaping.  No current inhalers and denies symptoms.  Will obtain spirometry next visit.  Continue annual lung cancer screening.

## 2023-04-01 NOTE — Assessment & Plan Note (Signed)
With long term anticoagulant use.  Monitor CBC regularly and watch for increased bleeding or bruising. 

## 2023-04-01 NOTE — Assessment & Plan Note (Signed)
 Chronic, stable.  BP well below goal today.  Have recommend he monitor BP at least 3 times a week and document for next visit as we may be able to reduce BP medications.  Discussed at length with him.  Continue current medication regimen and adjust as needed.  Focus on DASH diet.  LABS: up to date.  Refills up to date. Return in 6 months.

## 2023-04-01 NOTE — Assessment & Plan Note (Signed)
 Noted initially on lung screening 06/18/22.  Educated him on this finding.  Will continue statin therapy and Xarelto at home.

## 2023-04-01 NOTE — Assessment & Plan Note (Addendum)
 Chronic, ongoing.  Continue current medication regimen and adjust as needed.  Lipid panel at physical, had recent labs on the 21st and prefer to defer more labs today.

## 2023-04-01 NOTE — Assessment & Plan Note (Signed)
I have recommended complete cessation of nicotine use. I have discussed various options available for assistance with tobacco cessation including over the counter methods (Nicotine gum, patch and lozenges). We also discussed prescription options (Chantix, Nicotine Inhaler / Nasal Spray). The patient is not interested in pursuing any prescription tobacco cessation options at this time.  

## 2023-04-01 NOTE — Progress Notes (Signed)
 BP 129/65   Pulse 71   Temp 97.8 F (36.6 C) (Oral)   Ht 5\' 11"  (1.803 m)   Wt 205 lb (93 kg)   SpO2 98%   BMI 28.59 kg/m    Subjective:    Patient ID: Dwayne Jacobson, male    DOB: [birth year], 68 y.o.   MRN: [id number]  HPI: Dwayne Jacobson is a 68 y.o. male  Chief Complaint  Patient presents with   COPD   Hyperlipidemia   Hypertension   HYPERTENSION / HYPERLIPIDEMIA Continues Lisinopril, HCTZ, Atorvastatin, and Amlodipine.  History of a DVT and continues on Xarelto without issue. Follows with Dr. Corliss Skains for SLE, taking Plaquenil daily.  Saw them last 03/25/23. Satisfied with current treatment? yes Duration of hypertension: chronic BP monitoring frequency: not checking BP range: not checking BP medication side effects: no Duration of hyperlipidemia: chronic Cholesterol medication side effects: no Cholesterol supplements: none Medication compliance: good compliance Aspirin: no Recent stressors: no Recurrent headaches: no Visual changes: no Palpitations: no Dyspnea: no Chest pain: no Lower extremity edema: no Dizzy/lightheaded: no  The 10-year ASCVD risk score (Arnett DK, et al., 2019) is: 15.3%   Values used to calculate the score:     Age: 57 years     Sex: Male     Is Non-Hispanic African American: No     Diabetic: No     Tobacco smoker: No     Systolic Blood Pressure: 129 mmHg     Is BP treated: Yes     HDL Cholesterol: 45 mg/dL     Total Cholesterol: 150 mg/dL  COPD Vapes nicotine at this time.  Did smoke cigarettes in past, quit 9 years ago.  At the time smoke 1 PPD.  Went for lung screening on 06/18/22, moderate centrilobular and paraseptal emphysema + aortic atherosclerosis.  He denies any symptoms. COPD status: stable Satisfied with current treatment?: yes Oxygen use: no Dyspnea frequency: no Cough frequency: no Rescue inhaler frequency:   Limitation of activity: no Productive cough: no Last Spirometry: unknown Pneumovax: Up to  Date Influenza: Up to Date   Relevant past medical, surgical, family and social history reviewed and updated as indicated. Interim medical history since our last visit reviewed. Allergies and medications reviewed and updated.  Review of Systems  Constitutional:  Negative for activity change, diaphoresis, fatigue and fever.  Respiratory:  Negative for cough, chest tightness, shortness of breath and wheezing.   Cardiovascular:  Negative for chest pain, palpitations and leg swelling.  Gastrointestinal: Negative.   Neurological: Negative.   Psychiatric/Behavioral: Negative.      Per HPI unless specifically indicated above     Objective:    BP 129/65   Pulse 71   Temp 97.8 F (36.6 C) (Oral)   Ht 5\' 11"  (1.803 m)   Wt 205 lb (93 kg)   SpO2 98%   BMI 28.59 kg/m   Wt Readings from Last 3 Encounters:  04/01/23 205 lb (93 kg)  03/25/23 201 lb 12.8 oz (91.5 kg)  03/22/23 202 lb (91.6 kg)    Physical Exam Vitals and nursing note reviewed.  Constitutional:      General: He is awake. He is not in acute distress.    Appearance: He is well-developed and well-groomed. He is not ill-appearing or toxic-appearing.  HENT:     Head: Normocephalic.     Right Ear: Hearing and external ear normal.     Left Ear: Hearing and external ear normal.  Eyes:  General: Lids are normal.     Extraocular Movements: Extraocular movements intact.     Conjunctiva/sclera: Conjunctivae normal.  Neck:     Thyroid: No thyromegaly.     Vascular: No carotid bruit.  Cardiovascular:     Rate and Rhythm: Normal rate and regular rhythm.     Heart sounds: Normal heart sounds. No murmur heard.    No gallop.  Pulmonary:     Effort: No accessory muscle usage or respiratory distress.     Breath sounds: Normal breath sounds.  Abdominal:     General: Bowel sounds are normal. There is no distension.     Palpations: Abdomen is soft.     Tenderness: There is no abdominal tenderness.  Musculoskeletal:      Cervical back: Full passive range of motion without pain.     Right lower leg: No edema.     Left lower leg: No edema.  Lymphadenopathy:     Cervical: No cervical adenopathy.  Skin:    General: Skin is warm.     Capillary Refill: Capillary refill takes less than 2 seconds.  Neurological:     Mental Status: He is alert and oriented to person, place, and time.     Deep Tendon Reflexes: Reflexes are normal and symmetric.     Reflex Scores:      Brachioradialis reflexes are 2+ on the right side and 2+ on the left side.      Patellar reflexes are 2+ on the right side and 2+ on the left side. Psychiatric:        Attention and Perception: Attention normal.        Mood and Affect: Mood normal.        Speech: Speech normal.        Behavior: Behavior normal. Behavior is cooperative.        Thought Content: Thought content normal.    Results for orders placed or performed in visit on 03/25/23  Protein / creatinine ratio, urine   Collection Time: 03/25/23 12:04 PM  Result Value Ref Range   Creatinine, Urine 86 20 - 320 mg/dL   Protein/Creat Ratio 58 25 - 148 mg/g creat   Protein/Creatinine Ratio 0.058 0.025 - 0.148 mg/mg creat   Total Protein, Urine 5 5 - 25 mg/dL  CBC with Differential/Platelet   Collection Time: 03/25/23 12:04 PM  Result Value Ref Range   WBC 6.1 3.8 - 10.8 Thousand/uL   RBC 4.37 4.20 - 5.80 Million/uL   Hemoglobin 13.7 13.2 - 17.1 g/dL   HCT 29.5 62.1 - 30.8 %   MCV 93.4 80.0 - 100.0 fL   MCH 31.4 27.0 - 33.0 pg   MCHC 33.6 32.0 - 36.0 g/dL   RDW 65.7 84.6 - 96.2 %   Platelets 272 140 - 400 Thousand/uL   MPV 10.1 7.5 - 12.5 fL   Neutro Abs 4,014 1,500 - 7,800 cells/uL   Absolute Lymphocytes 1,159 850 - 3,900 cells/uL   Absolute Monocytes 665 200 - 950 cells/uL   Eosinophils Absolute 232 15 - 500 cells/uL   Basophils Absolute 31 0 - 200 cells/uL   Neutrophils Relative % 65.8 %   Total Lymphocyte 19.0 %   Monocytes Relative 10.9 %   Eosinophils Relative 3.8 %    Basophils Relative 0.5 %  COMPLETE METABOLIC PANEL WITH GFR   Collection Time: 03/25/23 12:04 PM  Result Value Ref Range   Glucose, Bld 81 65 - 99 mg/dL   BUN 11 7 -  25 mg/dL   Creat 4.09 8.11 - 9.14 mg/dL   eGFR 95 > OR = 60 NW/GNF/6.21H0   BUN/Creatinine Ratio SEE NOTE: 6 - 22 (calc)   Sodium 137 135 - 146 mmol/L   Potassium 4.3 3.5 - 5.3 mmol/L   Chloride 99 98 - 110 mmol/L   CO2 28 20 - 32 mmol/L   Calcium 10.0 8.6 - 10.3 mg/dL   Total Protein 7.3 6.1 - 8.1 g/dL   Albumin 4.7 3.6 - 5.1 g/dL   Globulin 2.6 1.9 - 3.7 g/dL (calc)   AG Ratio 1.8 1.0 - 2.5 (calc)   Total Bilirubin 0.4 0.2 - 1.2 mg/dL   Alkaline phosphatase (APISO) 78 35 - 144 U/L   AST 21 10 - 35 U/L   ALT 22 9 - 46 U/L  ANA   Collection Time: 03/25/23 12:04 PM  Result Value Ref Range   Anti Nuclear Antibody (ANA) POSITIVE (A) NEGATIVE  Anti-DNA antibody, double-stranded   Collection Time: 03/25/23 12:04 PM  Result Value Ref Range   ds DNA Ab 4 IU/mL  C3 and C4   Collection Time: 03/25/23 12:04 PM  Result Value Ref Range   C3 Complement 141 82 - 185 mg/dL   C4 Complement 27 15 - 53 mg/dL  Sedimentation rate   Collection Time: 03/25/23 12:04 PM  Result Value Ref Range   Sed Rate 14 0 - 20 mm/h  Anti-nuclear ab-titer (ANA titer)   Collection Time: 03/25/23 12:04 PM  Result Value Ref Range   ANA Titer 1 1:40 (H) titer   ANA Pattern 1 Nuclear, Discrete Nuclear Dots (A)       Assessment & Plan:   Problem List Items Addressed This Visit       Cardiovascular and Mediastinum   Aortic atherosclerosis (HCC)   Noted initially on lung screening 06/18/22.  Educated him on this finding.  Will continue statin therapy and Xarelto at home.      Hypertension   Chronic, stable.  BP well below goal today.  Have recommend he monitor BP at least 3 times a week and document for next visit as we may be able to reduce BP medications.  Discussed at length with him.  Continue current medication regimen and adjust as  needed.  Focus on DASH diet.  LABS: up to date.  Refills up to date. Return in 6 months.        Respiratory   Emphysema lung (HCC)   Chronic, stable. Note on lung cancer screening 06/18/22.  Educated him on this finding.  Recommend he cut back on vaping.  No current inhalers and denies symptoms.  Will obtain spirometry next visit.  Continue annual lung cancer screening.        Hematopoietic and Hemostatic   Other thrombophilia (HCC)   With long term anticoagulant use.  Monitor CBC regularly and watch for increased bleeding or bruising.        Other   Current every day nicotine vaping   I have recommended complete cessation of nicotine use. I have discussed various options available for assistance with tobacco cessation including over the counter methods (Nicotine gum, patch and lozenges). We also discussed prescription options (Chantix, Nicotine Inhaler / Nasal Spray). The patient is not interested in pursuing any prescription tobacco cessation options at this time.        History of DVT (deep vein thrombosis)   Chronic, stable on Xarelto.  Hematology consult in 2020 and was able to transition to Xarelto,  which he has taken without issue and has offered benefit to lifestyle.  CMP up to date.      Hyperlipidemia   Chronic, ongoing.  Continue current medication regimen and adjust as needed.  Lipid panel at physical, had recent labs on the 21st and prefer to defer more labs today.      SLE (systemic lupus erythematosus) (HCC) - Primary   Chronic, stable.  Will continue collaboration with his rheumatologist Dr. Corliss Skains.  Continue current medication regimen as prescribed by them.  Recent note and labs reviewed.        Follow up plan: Return in about 6 months (around 09/29/2023) for Annual Physical -- after 09/29/23.

## 2023-04-01 NOTE — Assessment & Plan Note (Addendum)
 Chronic, stable.  Will continue collaboration with his rheumatologist Dr. Corliss Skains.  Continue current medication regimen as prescribed by them.  Recent note and labs reviewed.

## 2023-04-13 ENCOUNTER — Encounter: Payer: Self-pay | Admitting: Neurosurgery

## 2023-04-13 NOTE — Progress Notes (Unsigned)
 Referring Physician:  Marjie Skiff, NP [address] [place],  [place] [address]  Primary Physician:  Marjie Skiff, NP  History of Present Illness: 04/15/2023 Mr. Elison Worrel is here today with a chief complaint of cervical spondylosis.  He is mostly send recent seen by Drake Leach in our clinic.  He has a history of neck pain as well as cervical stenosis.  He states that over the course of time he has had only 2 flares, however when he got them they were quite severe.  He did not have any injections physical therapy or steroid bursts.  Currently he is not having any weakness numbness or tingling.  Not having any signs or symptoms consistent with cervical myelopathy.  Gets no Lhermitte's phenomenon.  Has no numbness or tingling.  He has full strength.  Is not hyperreflexic.  He is here today to discuss ongoing management.   I have utilized the care everywhere function in epic to review the outside records available from external health systems.  Review of Systems:  A 10 point review of systems is negative, except for the pertinent positives and negatives detailed in the HPI.  Past Medical History: Past Medical History:  Diagnosis Date   DVT (deep venous thrombosis) (HCC)    Hyperlipidemia    Hypertension    Seizures (HCC)    Skin cancer    Systemic lupus erythematosus (HCC)     Past Surgical History: Past Surgical History:  Procedure Laterality Date   NO PAST SURGERIES      Allergies: Allergies as of 04/15/2023   (No Known Allergies)    Medications:  Current Outpatient Medications:    amLODipine (NORVASC) 5 MG tablet, Take 1 tablet (5 mg total) by mouth daily., Disp: 90 tablet, Rfl: 4   atorvastatin (LIPITOR) 20 MG tablet, Take 1 tablet (20 mg total) by mouth daily., Disp: 90 tablet, Rfl: 4   clobetasol (TEMOVATE) 0.05 % external solution, Apply topically as needed., Disp: , Rfl:    hydrochlorothiazide (HYDRODIURIL) 25 MG tablet, Take 0.5 tablets (12.5 mg total)  by mouth daily., Disp: 45 tablet, Rfl: 4   HYDROcodone-acetaminophen (NORCO/VICODIN) 5-325 MG tablet, Take 1 tablet by mouth every 6 (six) hours as needed., Disp: , Rfl:    hydroxychloroquine (PLAQUENIL) 200 MG tablet, Take 200 mg by mouth daily., Disp: , Rfl:    lisinopril (ZESTRIL) 20 MG tablet, Take 1 tablet (20 mg total) by mouth daily., Disp: 90 tablet, Rfl: 4   Multiple Vitamin (MULTIVITAMIN) tablet, Take 1 tablet by mouth daily., Disp: , Rfl:    omeprazole (PRILOSEC) 20 MG capsule, Take 20 mg by mouth daily., Disp: , Rfl:    Polyethyl Glycol-Propyl Glycol (SYSTANE) 0.4-0.3 % SOLN, Apply to eye., Disp: , Rfl:    rivaroxaban (XARELTO) 20 MG TABS tablet, Take 1 tablet (20 mg total) by mouth daily with supper., Disp: 90 tablet, Rfl: 4   sildenafil (VIAGRA) 100 MG tablet, Take 0.5-1 tablets (50-100 mg total) by mouth daily as needed for erectile dysfunction., Disp: 5 tablet, Rfl: 11   tiZANidine (ZANAFLEX) 4 MG tablet, Take 1 tablet (4 mg total) by mouth every 6 (six) hours as needed for muscle spasms., Disp: 45 tablet, Rfl: 0  Social History: Social History   Tobacco Use   Smoking status: Former    Current packs/day: 0.00    Average packs/day: 1 pack/day for 43.0 years (43.0 ttl pk-yrs)    Types: Cigarettes    Start date: 08/31/1970  Quit date: 08/30/2013    Years since quitting: 9.6    Passive exposure: Never   Smokeless tobacco: Former    Types: Engineer, drilling   Vaping status: Every Day   Substances: Nicotine  Substance Use Topics   Alcohol use: Yes    Alcohol/week: 8.0 - 10.0 standard drinks of alcohol    Types: 8 - 10 Cans of beer per week   Drug use: Never    Family Medical History: Family History  Problem Relation Age of Onset   Diabetes Mother    Hypertension Mother    Hyperlipidemia Mother    Stroke Mother    Lung disease Mother    Heart disease Mother    Heart disease Father    Lung disease Father    Diabetes Sister    Colon polyps Maternal Grandmother      Physical Examination: Vitals:   04/15/23 0938  BP: 128/78    General: Patient is in no apparent distress. Attention to examination is appropriate.  Neck:   Supple.  Full range of motion.  Respiratory: Patient is breathing without any difficulty.   NEUROLOGICAL:     Awake, alert, oriented to person, place, and time.  Speech is clear and fluent.   Cranial Nerves: Pupils equal round and reactive to light.  Facial tone is symmetric.  Facial sensation is symmetric. Shoulder shrug is symmetric. Tongue protrusion is midline.    Strength: Side Biceps Triceps Deltoid Interossei Grip Wrist Ext. Wrist Flex.  R 5 5 5 5 5 5 5   L 5 5 5 5 5 5 5    Side Iliopsoas Quads Hamstring PF DF EHL  R 5 5 5 5 5 5   L 5 5 5 5 5 5    He has no evidence of pathologic reflexes.  2+ at the bilateral biceps brachial radialis as well as his knees.  No since patient loss noted     No evidence of dysmetria noted.  Gait is normal.    Imaging: Narrative & Impression  CLINICAL DATA:  Cervical radiculopathy, no red flags Had x-ray imaging months back noting degenerative changes, pain is present and not improving   EXAM: MRI CERVICAL SPINE WITHOUT CONTRAST   TECHNIQUE: Multiplanar, multisequence MR imaging of the cervical spine was performed. No intravenous contrast was administered.   COMPARISON:  None Available.   FINDINGS: Alignment: Straightening of the normal cervical lordosis. Trace retrolisthesis C4 on C5.   Vertebrae: No fracture, evidence of discitis, or bone lesion. T2 hyperintense signal in the C5-C6 disc space is favored to be degenerative.   Cord: Normal signal and morphology.   Posterior Fossa, vertebral arteries, paraspinal tissues: Negative.   Disc levels:   C1-C2: No significant degenerative change.   C2-C3: Mild bilateral facet degenerative change. No spinal canal narrowing. No neural foraminal narrowing.   C3-C4: Mild bilateral facet degenerative change. No spinal  canal narrowing. No neural foraminal narrowing.   C4-C5: Circumferential disc bulge. Moderate to severe spinal canal narrowing. Uncovertebral hypertrophy. Moderate left and mild right facet degenerative change. Severe left and moderate right neural foraminal narrowing.   C5-C6: Circumferential disc bulge. Moderate to severe spinal canal narrowing. Uncovertebral hypertrophy. Moderate to severe bilateral neural foraminal narrowing.   C6-C7: Minimal disc bulge. Uncovertebral hypertrophy. Mild bilateral facet degenerative change. Moderate bilateral neural foraminal narrowing.   C7-T1: Mild bilateral facet degenerative change. No spinal canal or neural foraminal narrowing.   IMPRESSION: Moderate to severe spinal canal and bilateral neuroforaminal narrowing at C4-C5  and C5-C6.     Electronically Signed   By: Lorenza Cambridge M.D.   On: 02/11/2023 11:34   I reviewed his imaging as well, I do not see any T2 signal change in the cord itself, there is some T2 signal change in the 5 6 disc.  He has moderate stenosis without any severe deformation of the spinal cord.   Medical Decision Making/Assessment and Plan: [name] is a pleasant 68 y.o. male with history of neck pain and soreness with 2 flares of left-sided cervical radiculopathy.  He states that these were quite severe.  He just waited for them to improve over time.  Thankfully they did.  He did not get any steroids, cervical injections or physical therapy.  On physical examination today he is full strength with no evidence of pathologic reflexes.  His imaging shows cervical stenosis most severe at C4-C6 but no evidence of T2 signal change in the cord representing myelomalacia, and no long track signs on his physical exam consistent with progressive myelopathy.  He has no history concerning for myelopathy.  Has not noticed any change in his balance, loss of dexterity, trouble with bowel or bladder function.  He has not yet had any physical  therapy, injections, or steroid bursts.  He is currently asymptomatic.  We had a long discussion about his imaging the cervical stenosis and how it could present with symptoms.  I let him know that his left-sided upper extremity flares were likely caused by the disc herniations and foraminal stenosis however without active symptoms I would not treat them surgically.  We discussed that if he had another flare that was not associated with any weakness that we would first try a steroid Dosepak and he could reach out to [place] if this happens again.  A follow-up option after that would be cervical injections and physical therapy.  If he does however develop weakness or progressive myelopathy then a surgical indication would be present.  We did discuss that given his ongoing cervical stenosis that he is at higher risk for cervical spinal cord injury in the setting of a traumatic injury.  He wanted to discuss the treatment for the stenosis in that setting, we discussed an anterior cervical discectomy and fusion and associated risk factors including difficulty with swallowing, spinal cord injury, CSF leak, and C5 palsy specifically.  At this point he does not want to move forward with a surgical intervention to prevent complications from a potential trauma.  We discussed the signs and symptoms of progressive myelopathy and radiculopathy as well as red flags to follow-up with, they expressed understanding.  I like to continue to follow him once to twice a year to make sure that he is not developing any myelopathy in the setting of his cervical stenosis.    Thank you for involving me in the care of this patient.    Lovenia Kim MD/MSCR Neurosurgery   MDM: Moderate, reviewed cervical MRI myself as outlined above, discussed a major elective surgery

## 2023-04-15 ENCOUNTER — Ambulatory Visit: Payer: No Typology Code available for payment source | Admitting: Neurosurgery

## 2023-04-15 VITALS — BP 128/78 | Ht 71.0 in | Wt 205.0 lb

## 2023-04-15 DIAGNOSIS — M4802 Spinal stenosis, cervical region: Secondary | ICD-10-CM

## 2023-07-05 ENCOUNTER — Other Ambulatory Visit: Payer: Self-pay

## 2023-07-05 DIAGNOSIS — Z87891 Personal history of nicotine dependence: Secondary | ICD-10-CM

## 2023-07-05 DIAGNOSIS — Z122 Encounter for screening for malignant neoplasm of respiratory organs: Secondary | ICD-10-CM

## 2023-07-18 ENCOUNTER — Telehealth: Payer: Self-pay | Admitting: Nurse Practitioner

## 2023-07-18 NOTE — Telephone Encounter (Unsigned)
 Copied from CRM [id number]. Topic: Clinical - Medication Refill >> Jul 18, 2023  8:55 AM Precious C wrote: Medication: atorvastatin  (LIPITOR) 20 MG tablet  Has the patient contacted their pharmacy? Yes (Agent: If no, request that the patient contact the pharmacy for the refill. If patient does not wish to contact the pharmacy document the reason why and proceed with request.) (Agent: If yes, when and what did the pharmacy advise?)  This is the patient's preferred pharmacy:  [facility] [id number] [place], [place] - [address]  Is this the correct pharmacy for this prescription? Yes If no, delete pharmacy and type the correct one.   Has the prescription been filled recently? No  Is the patient out of the medication? No  Has the patient been seen for an appointment in the last year OR does the patient have an upcoming appointment? Yes  Can we respond through MyChart? Yes  Agent: Please be advised that Rx refills may take up to 3 business days. We ask that you follow-up with your pharmacy.

## 2023-07-20 MED ORDER — ATORVASTATIN CALCIUM 20 MG PO TABS: 20.0000 mg | ORAL_TABLET | Freq: Every day | ORAL | 1 refills | Status: DC

## 2023-07-20 NOTE — Telephone Encounter (Signed)
 Requested Prescriptions  Pending Prescriptions Disp Refills   atorvastatin  (LIPITOR) 20 MG tablet 90 tablet 1    Sig: Take 1 tablet (20 mg total) by mouth daily.     Cardiovascular:  Antilipid - Statins Failed - 07/20/2023 11:03 AM      Failed - Lipid Panel in normal range within the last 12 months    Cholesterol, Total  Date Value Ref Range Status  09/29/2022 150 100 - 199 mg/dL Final   Cholesterol [name], [name]  Date Value Ref Range Status  11/07/2018 161 <200 mg/dL Final    Comment:                            Desirable                <200                         Borderline High      200- 239                         High                     >239    LDL Chol Calc (NIH)  Date Value Ref Range Status  09/29/2022 78 0 - 99 mg/dL Final   HDL  Date Value Ref Range Status  09/29/2022 45 >39 mg/dL Final   Triglycerides  Date Value Ref Range Status  09/29/2022 159 (H) 0 - 149 mg/dL Final   Triglycerides [name],[name]  Date Value Ref Range Status  11/07/2018 113 <150 mg/dL Final    Comment:                            Normal                   <150                         Borderline High     150 - 199                         High                200 - 499                         Very High                >499          Passed - Patient is not pregnant      Passed - Valid encounter within last 12 months    Recent Outpatient Visits           3 months ago Other systemic lupus erythematosus with other organ involvement [facility])   [facility] Lemar Pyles, NP       Future Appointments             In 1 month Eddie Good, Arty Later [facility] - A Dept Of [place]. [facility]   In 2 months [place], Lavelle Posey, NP [facility], [place]

## 2023-07-22 ENCOUNTER — Ambulatory Visit
Admission: RE | Admit: 2023-07-22 | Discharge: 2023-07-22 | Disposition: A | Discharge status: Home / Self Care | Source: Ambulatory Visit | Attending: Nurse Practitioner | Admitting: Nurse Practitioner

## 2023-07-22 DIAGNOSIS — Z87891 Personal history of nicotine dependence: Secondary | ICD-10-CM

## 2023-07-22 DIAGNOSIS — Z122 Encounter for screening for malignant neoplasm of respiratory organs: Secondary | ICD-10-CM

## 2023-08-04 ENCOUNTER — Other Ambulatory Visit: Payer: Self-pay | Admitting: Acute Care

## 2023-08-04 DIAGNOSIS — Z122 Encounter for screening for malignant neoplasm of respiratory organs: Secondary | ICD-10-CM

## 2023-08-04 DIAGNOSIS — Z87891 Personal history of nicotine dependence: Secondary | ICD-10-CM

## 2023-08-12 NOTE — Progress Notes (Unsigned)
 Office Visit Note  Patient: Dwayne Jacobson             Date of Birth: 1955-04-21           MRN: [id number]             PCP: Valerio Melanie DASEN, NP Referring: Valerio Melanie DASEN, NP Visit Date: 08/26/2023 Occupation: @GUAROCC @  Subjective:  Medication monitoring  History of Present Illness: Dwayne Jacobson is a 68 y.o. male with history of systemic lupus and osteoarthritis.  Patient remains on  Plaquenil 200 mg 1 tablet by mouth daily.  He is tolerating Plaquenil without any side effects and has not had any recent gaps in therapy.  He denies any signs or symptoms of a systemic lupus flare.  Patient states that his energy level has been stable overall.  He continues to have chronic dry eyes and uses Systane eyedrops for symptomatic relief.  He denies any mouth dryness or oral or nasal ulcerations.  He denies any recent rashes.  He has been following up with dermatology on a yearly basis.  He denies any new medical conditions.  He denies any recent or recurrent infections.  Activities of Daily Living:  Patient reports morning stiffness for 0 minutes  Patient Denies nocturnal pain.  Difficulty dressing/grooming: Denies Difficulty climbing stairs: Denies Difficulty getting out of chair: Denies Difficulty using hands for taps, buttons, cutlery, and/or writing: Denies  Review of Systems  Constitutional:  Negative for fatigue and night sweats.  HENT:  Negative for mouth sores, mouth dryness and nose dryness.   Eyes:  Positive for dryness. Negative for redness.  Respiratory:  Negative for cough, hemoptysis, shortness of breath and difficulty breathing.   Cardiovascular:  Negative for chest pain, palpitations, hypertension, irregular heartbeat and swelling in legs/feet.  Gastrointestinal:  Negative for constipation and diarrhea.  Endocrine: Negative.  Negative for increased urination.  Genitourinary:  Negative for painful urination.  Musculoskeletal:  Negative for joint pain, joint pain, joint  swelling, myalgias, muscle weakness, morning stiffness, muscle tenderness and myalgias.  Skin:  Positive for sensitivity to sunlight. Negative for color change, rash, hair loss, nodules/bumps, skin tightness and ulcers.  Allergic/Immunologic: Negative for susceptible to infections.  Neurological:  Negative for dizziness, fainting, memory loss, night sweats and weakness.  Hematological:  Negative for swollen glands.  Psychiatric/Behavioral:  Negative for depressed mood and sleep disturbance. The patient is not nervous/anxious.     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   Current every day nicotine vaping 10/31/2020   Healthcare maintenance 09/19/2020   Gastroesophageal reflux disease without esophagitis 08/17/2019   Chondrocalcinosis 01/13/2017   Primary osteoarthritis of both knees 01/13/2017   History of DVT (deep vein thrombosis)    Hypertension    Hyperlipidemia    SLE (systemic lupus erythematosus) (HCC) 01/18/2014    Past Medical History:  Diagnosis Date   DVT (deep venous thrombosis) (HCC)    Hyperlipidemia    Hypertension    Seizures (HCC)    Skin cancer    Systemic lupus erythematosus (HCC)     Family History  Problem Relation Age of Onset   Diabetes Mother    Hypertension Mother    Hyperlipidemia Mother  Stroke Mother    Lung disease Mother    Heart disease Mother    Heart disease Father    Lung disease Father    Diabetes Sister    Colon polyps Maternal Grandmother    Past Surgical History:  Procedure Laterality Date   NO PAST SURGERIES     Social History   Social History Narrative   Not on file   Immunization History  Administered  Date(s) Administered   Fluad Quad(high Dose 65+) 12/05/2020   Influenza,inj,Quad PF,6+ Mos 01/03/2015, 11/05/2016, 11/07/2018   PNEUMOCOCCAL CONJUGATE-20 09/25/2021   Pneumococcal Polysaccharide-23 09/29/2012   Td 03/25/2006, 08/06/2016     Objective: Vital Signs: BP 105/63 (BP Location: Left Arm, Patient Position: Sitting, Cuff Size: Normal)   Pulse 60   Resp 16   Ht 5' 11 (1.803 m)   Wt 199 lb (90.3 kg)   BMI 27.75 kg/m    Physical Exam Vitals and nursing note reviewed.  Constitutional:      Appearance: He is well-developed.  HENT:     Head: Normocephalic and atraumatic.  Eyes:     Conjunctiva/sclera: Conjunctivae normal.     Pupils: Pupils are equal, round, and reactive to light.  Cardiovascular:     Rate and Rhythm: Normal rate and regular rhythm.     Heart sounds: Normal heart sounds.  Pulmonary:     Effort: Pulmonary effort is normal.     Breath sounds: Normal breath sounds.  Abdominal:     General: Bowel sounds are normal.     Palpations: Abdomen is soft.  Musculoskeletal:     Cervical back: Normal range of motion and neck supple.  Skin:    General: Skin is warm and dry.     Capillary Refill: Capillary refill takes less than 2 seconds.  Neurological:     Mental Status: He is alert and oriented to person, place, and time.  Psychiatric:        Behavior: Behavior normal.      Musculoskeletal Exam: C-spine has limited range of motion without rotation.  Some midline spinal tenderness in the cervical region.  Shoulder joints have good range of motion.  Limited extension of both the elbow joints.  Wrist joints, MCPs, PIPs, DIPs have good range of motion with no synovitis.  Complete fist formation bilaterally.  CMC, PIP, DIP thickening noted.  Hip joints have good range of motion with no groin pain.  Knee joints have good range of motion no warmth or effusion.  Ankle joints have good range of motion with no tenderness or joint swelling.  CDAI Exam: CDAI Score:  -- Patient Global: --; Provider Global: -- Swollen: --; Tender: -- Joint Exam 08/26/2023   No joint exam has been documented for this visit   There is currently no information documented on the homunculus. Go to the Rheumatology activity and complete the homunculus joint exam.  Investigation: No additional findings.  Imaging: No results found.   Recent Labs: Lab Results  Component Value Date   WBC 6.1 03/25/2023   HGB 13.7 03/25/2023   PLT 272 03/25/2023   NA 137 03/25/2023   K 4.3 03/25/2023   CL 99 03/25/2023   CO2 28 03/25/2023   GLUCOSE 81 03/25/2023   BUN 11 03/25/2023   CREATININE 0.87 03/25/2023   BILITOT 0.4 03/25/2023   ALKPHOS 79 09/29/2022   AST 21 03/25/2023   ALT 22 03/25/2023   PROT 7.3 03/25/2023   ALBUMIN 4.7 09/29/2022   CALCIUM  10.0 03/25/2023  GFRAA 103 03/21/2020    Speciality Comments: PLQ Eye Exam: 03/04/2023 WNL @ [facility] Follow up in 1 year   Called to request updated PLQ eye exam.  Procedures:  No procedures performed Allergies: Patient has no known allergies.     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 History of rash, diagnosed with lupus by Dr. Maryl many years ago: He has not had any signs or symptoms of a systemic lupus flare.  He has clinically been doing well taking Plaquenil 200 mg 1 tablet by mouth daily.  He is tolerating Plaquenil without any side effects and has not had any gaps in therapy.  He has not had any recent rashes and continues to follow-up with dermatology, Dr. Dela on a yearly basis.  Discussed the importance of avoiding direct sun exposure.  He has not had any oral or nasal ulcerations.  He has chronic dry eyes and uses Systane eyedrops as needed.  He has had less frequent symptoms of Raynaud's phenomenon during the summer months.  No signs of sclerodactyly noted. Lab work from 03/25/23 was reviewed  today in the office: dsDNA negative, complements WNL, ESR WNL, urine protein creatinine ratio, ANA 1:40 nuclear discrete nuclear dots.  Plan to update lab work today but the lab tech was not in the office so he plans on having labs obtained with his PCP.  Future orders were placed today. He was advised to notify us  if he develops any new or worsening symptoms.  He will follow-up in the office in 5-6 months or sooner if needed. - Plan: Protein / creatinine ratio, urine, CBC with Differential/Platelet, C3 and C4, Sedimentation rate, Comprehensive metabolic panel with GFR, Anti-DNA antibody, double-stranded  High risk medication use - Plaquenil 200 mg 1 tablet by mouth daily-prescribed by Dr. Alm Dela (dermatologist). PLQ Eye Exam: 03/04/2023 WNL @ [facility] Follow up in 1 year  CBC and CMP updated on 03/25/23.  He plans on having updated lab work with his PCP-Future orders for CBC and CMP placed today. - Plan: CBC with Differential/Platelet, Comprehensive metabolic panel with GFR  Trapezius muscle spasm: He has some trapezius muscle tension and tenderness but no muscle spasms at this time.   Primary osteoarthritis of both knees - X-rays in 2018 showed moderate osteoarthritis.  He has good range of motion of both knee joints on examination today.  No warmth or effusion noted.  He has not had any difficulty rising from a seated position or climbing steps.    Chondrocalcinosis: No signs or symptoms of a pseudogout flare.  DDD (degenerative disc disease), cervical: C-spine has limited range of motion without rotation.  Midline spinal tenderness in the cervical region.  No symptoms of radiculopathy.  Other medical conditions are listed as follows:  Essential hypertension: BP 105/63 today in the office.   Mixed hyperlipidemia  History of DVT (deep vein thrombosis) - in 2001.  He is on long-term Xarelto .  Anticardiolipin, beta-2  GP 1 and lupus anticoagulant were negative when tested in July  2022.  Orders: Orders Placed This Encounter  Procedures   Protein / creatinine ratio, urine   CBC with Differential/Platelet   C3 and C4   Sedimentation rate   Comprehensive metabolic panel with GFR   Anti-DNA antibody, double-stranded   No orders of the defined types were placed in this encounter.   Follow-Up Instructions: Return in about 6 months (  around 02/26/2024) for Systemic lupus erythematosus.   Dwayne CHRISTELLA Craze, PA-C  Note - This record has been created using Dragon software.  Chart creation errors have been sought, but may not always  have been located. Such creation errors do not reflect on  the standard of medical care.

## 2023-08-26 ENCOUNTER — Encounter: Payer: Self-pay | Admitting: Physician Assistant

## 2023-08-26 ENCOUNTER — Ambulatory Visit: Payer: No Typology Code available for payment source | Attending: Physician Assistant | Admitting: Physician Assistant

## 2023-08-26 VITALS — BP 105/63 | HR 60 | Resp 16 | Ht 71.0 in | Wt 199.0 lb

## 2023-08-26 DIAGNOSIS — M62838 Other muscle spasm: Secondary | ICD-10-CM

## 2023-08-26 DIAGNOSIS — E782 Mixed hyperlipidemia: Secondary | ICD-10-CM

## 2023-08-26 DIAGNOSIS — M3219 Other organ or system involvement in systemic lupus erythematosus: Secondary | ICD-10-CM

## 2023-08-26 DIAGNOSIS — M1120 Other chondrocalcinosis, unspecified site: Secondary | ICD-10-CM

## 2023-08-26 DIAGNOSIS — M170 Bilateral primary osteoarthritis of knee: Secondary | ICD-10-CM | POA: Diagnosis not present

## 2023-08-26 DIAGNOSIS — Z86718 Personal history of other venous thrombosis and embolism: Secondary | ICD-10-CM

## 2023-08-26 DIAGNOSIS — Z79899 Other long term (current) drug therapy: Secondary | ICD-10-CM

## 2023-08-26 DIAGNOSIS — I10 Essential (primary) hypertension: Secondary | ICD-10-CM

## 2023-08-26 DIAGNOSIS — M5030 Other cervical disc degeneration, unspecified cervical region: Secondary | ICD-10-CM

## 2023-10-02 NOTE — Patient Instructions (Signed)
 Be Involved in Caring For Your Health:  Taking Medications When medications are taken as directed, they can greatly improve your health. But if they are not taken as prescribed, they may not work. In some cases, not taking them correctly can be harmful. To help ensure your treatment remains effective and safe, understand your medications and how to take them. Bring your medications to each visit for review by your provider.  Your lab results, notes, and after visit summary will be available on My Chart. We strongly encourage you to use this feature. If lab results are abnormal the clinic will contact you with the appropriate steps. If the clinic does not contact you assume the results are satisfactory. You can always view your results on My Chart. If you have questions regarding your health or results, please contact the clinic during office hours. You can also ask questions on My Chart.  We at [facility] are grateful that you chose [place] to provide your care. We strive to provide evidence-based and compassionate care and are always looking for feedback. If you get a survey from the clinic please complete this so we can hear your opinions.  Heart-Healthy Eating Plan Many factors influence your heart health, including eating and exercise habits. Heart health is also called coronary health. Coronary risk increases with abnormal blood fat (lipid) levels. A heart-healthy eating plan includes limiting unhealthy fats, increasing healthy fats, limiting salt (sodium) intake, and making other diet and lifestyle changes. What is my plan? Your health care provider may recommend that: You limit your fat intake to _________% or less of your total calories each day. You limit your saturated fat intake to _________% or less of your total calories each day. You limit the amount of cholesterol in your diet to less than _________ mg per day. You limit the amount of sodium in your diet to less than _________  mg per day. What are tips for following this plan? Cooking Cook foods using methods other than frying. Baking, boiling, grilling, and broiling are all good options. Other ways to reduce fat include: Removing the skin from poultry. Removing all visible fats from meats. Steaming vegetables in water or broth. Meal planning  At meals, imagine dividing your plate into fourths: Fill one-half of your plate with vegetables and green salads. Fill one-fourth of your plate with whole grains. Fill one-fourth of your plate with lean protein foods. Eat 2-4 cups of vegetables per day. One cup of vegetables equals 1 cup (91 g) broccoli or cauliflower florets, 2 medium carrots, 1 large bell pepper, 1 large sweet potato, 1 large tomato, 1 medium white potato, 2 cups (150 g) raw leafy greens. Eat 1-2 cups of fruit per day. One cup of fruit equals 1 small apple, 1 large banana, 1 cup (237 g) mixed fruit, 1 large orange,  cup (82 g) dried fruit, 1 cup (240 mL) 100% fruit juice. Eat more foods that contain soluble fiber. Examples include apples, broccoli, carrots, beans, peas, and barley. Aim to get 25-30 g of fiber per day. Increase your consumption of legumes, nuts, and seeds to 4-5 servings per week. One serving of dried beans or legumes equals  cup (90 g) cooked, 1 serving of nuts is  oz (12 almonds, 24 pistachios, or 7 walnut halves), and 1 serving of seeds equals  oz (8 g). Fats Choose healthy fats more often. Choose monounsaturated and polyunsaturated fats, such as olive and canola oils, avocado oil, flaxseeds, walnuts, almonds, and seeds. Eat  more omega-3 fats. Choose salmon, mackerel, sardines, tuna, flaxseed oil, and ground flaxseeds. Aim to eat fish at least 2 times each week. Check food labels carefully to identify foods with trans fats or high amounts of saturated fat. Limit saturated fats. These are found in animal products, such as meats, butter, and cream. Plant sources of saturated fats  include palm oil, palm kernel oil, and coconut oil. Avoid foods with partially hydrogenated oils in them. These contain trans fats. Examples are stick margarine, some tub margarines, cookies, crackers, and other baked goods. Avoid fried foods. General information Eat more home-cooked food and less restaurant, buffet, and fast food. Limit or avoid alcohol. Limit foods that are high in added sugar and simple starches such as foods made using white refined flour (white breads, pastries, sweets). Lose weight if you are overweight. Losing just 5-10% of your body weight can help your overall health and prevent diseases such as diabetes and heart disease. Monitor your sodium intake, especially if you have high blood pressure. Talk with your health care provider about your sodium intake. Try to incorporate more vegetarian meals weekly. What foods should I eat? Fruits All fresh, canned (in natural juice), or frozen fruits. Vegetables Fresh or frozen vegetables (raw, steamed, roasted, or grilled). Green salads. Grains Most grains. Choose whole wheat and whole grains most of the time. Rice and pasta, including brown rice and pastas made with whole wheat. Meats and other proteins Lean, well-trimmed beef, veal, pork, and lamb. Chicken and [place] without skin. All fish and shellfish. Wild duck, rabbit, pheasant, and venison. Egg whites or low-cholesterol egg substitutes. Dried beans, peas, lentils, and tofu. Seeds and most nuts. Dairy Low-fat or nonfat cheeses, including ricotta and mozzarella. Skim or 1% milk (liquid, powdered, or evaporated). Buttermilk made with low-fat milk. Nonfat or low-fat yogurt. Fats and oils Non-hydrogenated (trans-free) margarines. Vegetable oils, including soybean, sesame, sunflower, olive, avocado, peanut, safflower, corn, canola, and cottonseed. Salad dressings or mayonnaise made with a vegetable oil. Beverages Water (mineral or sparkling). Coffee and tea. Unsweetened ice  tea. Diet beverages. Sweets and desserts Sherbet, gelatin, and fruit ice. Small amounts of dark chocolate. Limit all sweets and desserts. Seasonings and condiments All seasonings and condiments. The items listed above may not be a complete list of foods and beverages you can eat. Contact a dietitian for more options. What foods should I avoid? Fruits Canned fruit in heavy syrup. Fruit in cream or butter sauce. Fried fruit. Limit coconut. Vegetables Vegetables cooked in cheese, cream, or butter sauce. Fried vegetables. Grains Breads made with saturated or trans fats, oils, or whole milk. Croissants. Sweet rolls. Donuts. High-fat crackers, such as cheese crackers and chips. Meats and other proteins Fatty meats, such as hot dogs, ribs, sausage, bacon, rib-eye roast or steak. High-fat deli meats, such as salami and bologna. Caviar. Domestic duck and goose. Organ meats, such as liver. Dairy Cream, sour cream, cream cheese, and creamed cottage cheese. Whole-milk cheeses. Whole or 2% milk (liquid, evaporated, or condensed). Whole buttermilk. Cream sauce or high-fat cheese sauce. Whole-milk yogurt. Fats and oils Meat fat, or shortening. Cocoa butter, hydrogenated oils, palm oil, coconut oil, palm kernel oil. Solid fats and shortenings, including bacon fat, salt pork, lard, and butter. Nondairy cream substitutes. Salad dressings with cheese or sour cream. Beverages Regular sodas and any drinks with added sugar. Sweets and desserts Frosting. Pudding. Cookies. Cakes. Pies. Milk chocolate or white chocolate. Buttered syrups. Full-fat ice cream or ice cream drinks. The items listed above may  not be a complete list of foods and beverages to avoid. Contact a dietitian for more information. Summary Heart-healthy meal planning includes limiting unhealthy fats, increasing healthy fats, limiting salt (sodium) intake and making other diet and lifestyle changes. Lose weight if you are overweight. Losing just  5-10% of your body weight can help your overall health and prevent diseases such as diabetes and heart disease. Focus on eating a balance of foods, including fruits and vegetables, low-fat or nonfat dairy, lean protein, nuts and legumes, whole grains, and heart-healthy oils and fats. This information is not intended to replace advice given to you by your health care provider. Make sure you discuss any questions you have with your health care provider. Document Revised: 02/23/2021 Document Reviewed: 02/23/2021 Elsevier Patient Education  2024 [employer].

## 2023-10-07 ENCOUNTER — Encounter: Payer: Self-pay | Admitting: Nurse Practitioner

## 2023-10-07 ENCOUNTER — Ambulatory Visit: Payer: No Typology Code available for payment source | Admitting: Nurse Practitioner

## 2023-10-07 VITALS — BP 122/68 | HR 60 | Temp 98.4°F | Resp 15 | Ht 70.98 in | Wt 199.0 lb

## 2023-10-07 DIAGNOSIS — J432 Centrilobular emphysema: Secondary | ICD-10-CM | POA: Diagnosis not present

## 2023-10-07 DIAGNOSIS — I10 Essential (primary) hypertension: Secondary | ICD-10-CM

## 2023-10-07 DIAGNOSIS — N400 Enlarged prostate without lower urinary tract symptoms: Secondary | ICD-10-CM

## 2023-10-07 DIAGNOSIS — Z0000 Encounter for general adult medical examination without abnormal findings: Secondary | ICD-10-CM

## 2023-10-07 DIAGNOSIS — Z1211 Encounter for screening for malignant neoplasm of colon: Secondary | ICD-10-CM | POA: Diagnosis not present

## 2023-10-07 DIAGNOSIS — E782 Mixed hyperlipidemia: Secondary | ICD-10-CM

## 2023-10-07 DIAGNOSIS — M3219 Other organ or system involvement in systemic lupus erythematosus: Secondary | ICD-10-CM | POA: Diagnosis not present

## 2023-10-07 DIAGNOSIS — I700 Atherosclerosis of aorta: Secondary | ICD-10-CM

## 2023-10-07 DIAGNOSIS — Z23 Encounter for immunization: Secondary | ICD-10-CM | POA: Diagnosis not present

## 2023-10-07 DIAGNOSIS — Z86718 Personal history of other venous thrombosis and embolism: Secondary | ICD-10-CM

## 2023-10-07 DIAGNOSIS — Z720 Tobacco use: Secondary | ICD-10-CM

## 2023-10-07 DIAGNOSIS — D6869 Other thrombophilia: Secondary | ICD-10-CM | POA: Diagnosis not present

## 2023-10-07 DIAGNOSIS — K219 Gastro-esophageal reflux disease without esophagitis: Secondary | ICD-10-CM

## 2023-10-07 DIAGNOSIS — I288 Other diseases of pulmonary vessels: Secondary | ICD-10-CM | POA: Diagnosis not present

## 2023-10-07 MED ORDER — AMLODIPINE BESYLATE 5 MG PO TABS: 5.0000 mg | ORAL_TABLET | Freq: Every day | ORAL | 4 refills | Status: AC

## 2023-10-07 MED ORDER — LISINOPRIL 20 MG PO TABS: 20.0000 mg | ORAL_TABLET | Freq: Every day | ORAL | 4 refills | Status: AC

## 2023-10-07 MED ORDER — RIVAROXABAN 20 MG PO TABS: 20.0000 mg | ORAL_TABLET | Freq: Every day | ORAL | 4 refills | Status: DC

## 2023-10-07 MED ORDER — ATORVASTATIN CALCIUM 20 MG PO TABS: 20.0000 mg | ORAL_TABLET | Freq: Every day | ORAL | 4 refills | Status: DC

## 2023-10-07 MED ORDER — HYDROCHLOROTHIAZIDE 25 MG PO TABS: 12.5000 mg | ORAL_TABLET | Freq: Every day | ORAL | 4 refills | Status: AC

## 2023-10-07 NOTE — Progress Notes (Signed)
 BP 122/68 (BP Location: Left Arm, Patient Position: Sitting, Cuff Size: Normal)   Pulse 60   Temp 98.4 F (36.9 C) (Oral)   Resp 15   Ht 5' 10.98 (1.803 m)   Wt 199 lb (90.3 kg)   SpO2 97%   BMI 27.77 kg/m    Subjective:    Patient ID: Dwayne Jacobson, male    DOB: [birth year], 68 y.o.   MRN: [id number]  HPI: Dwayne Jacobson is a 68 y.o. male presenting on 10/07/2023 for comprehensive medical examination. Current medical complaints include: none  He currently lives with: wife Interim Problems from his last visit: none  HYPERTENSION / HYPERLIPIDEMIA Continues to take Lisinopril , HCTZ, Atorvastatin , and Amlodipine .  History of a DVT and takes Xarelto  without issue.  Follows with Dr. Dolphus for SLE, taking Plaquenil daily. Last had a visit on 08/26/23 with no changes. Satisfied with current treatment? yes Duration of hypertension: chronic BP monitoring frequency: not checking BP range: not checking BP medication side effects: no Duration of hyperlipidemia: chronic Cholesterol medication side effects: no Cholesterol supplements: none Medication compliance: good compliance Aspirin: no Recent stressors: no Recurrent headaches: no Visual changes: no Palpitations: no Dyspnea: no Chest pain: no Lower extremity edema: no Dizzy/lightheaded: no  The 10-year ASCVD risk score (Arnett DK, et al., 2019) is: 15.1%   Values used to calculate the score:     Age: 47 years     Clincally relevant sex: Male     Is Non-Hispanic African American: No     Diabetic: No     Tobacco smoker: No     Systolic Blood Pressure: 122 mmHg     Is BP treated: Yes     HDL Cholesterol: 45 mg/dL     Total Cholesterol: 150 mg/dL  GERD Takes Omeprazole  20 MG every other day. GERD control status: stable  Satisfied with current treatment? yes Heartburn frequency: none Medication side effects: no  Medication compliance: stable Dysphagia: no Odynophagia:  no Hematemesis: no Blood in stool: no EGD:  no  COPD Continues to vape nicotine. Smoked cigarettes in past, quit 10 years ago.  Smoked 1 PPD.  Last lung cancer screening was on 07/22/23, notes centrilobular and paraseptal emphysema + aortic atherosclerosis.  There is also mention of enlarged pulmonic trunk. COPD status: stable Satisfied with current treatment?: yes Oxygen use: no Dyspnea frequency: no Cough frequency: no Rescue inhaler frequency:   Limitation of activity: no Productive cough: no Last Spirometry: unknown Pneumovax: Up to Date Influenza: Up to Date   Functional Status Survey: Is the patient deaf or have difficulty hearing?: No Does the patient have difficulty seeing, even when wearing glasses/contacts?: No Does the patient have difficulty concentrating, remembering, or making decisions?: No Does the patient have difficulty walking or climbing stairs?: No Does the patient have difficulty dressing or bathing?: No Does the patient have difficulty doing errands alone such as visiting a doctor's office or shopping?: No  FALL RISK:    10/07/2023   12:56 PM 04/01/2023    1:10 PM 02/14/2023    2:46 PM 01/11/2023    1:58 PM 09/29/2022    1:14 PM  Fall Risk   Falls in the past year? 0 0 0 0 0  Number falls in past yr: 0 0 0 0 0  Injury with Fall? 0 0 0 0 0  Risk for fall due to :  No Fall Risks No Fall Risks No Fall Risks No Fall Risks  Follow up Falls evaluation  completed Falls evaluation completed Falls evaluation completed Falls evaluation completed Falls evaluation completed    Depression Screen    10/07/2023   12:56 PM 04/01/2023    1:10 PM 02/14/2023    2:46 PM 01/11/2023    1:59 PM 09/29/2022    1:14 PM  Depression screen PHQ 2/9  Decreased Interest 0 0 0 0 0  Down, Depressed, Hopeless 0 0 0 0 0  PHQ - 2 Score 0 0 0 0 0  Altered sleeping 0 0 0 0 0  Tired, decreased energy 1 0 1 1 0  Change in appetite  0 0 0 0  Feeling bad or failure about yourself  0 0 0 0 0  Trouble concentrating 0 0 0 0 0  Moving  slowly or fidgety/restless 0 0 0 0 0  Suicidal thoughts 0 0 0 0 0  PHQ-9 Score 1 0 1 1 0  Difficult doing work/chores  Not difficult at all Not difficult at all Not difficult at all Not difficult at all      10/07/2023   12:56 PM 04/01/2023    1:10 PM 02/14/2023    2:47 PM 01/11/2023    1:59 PM  GAD 7 : Generalized Anxiety Score  Nervous, Anxious, on Edge 0 0 0 0  Control/stop worrying 0 0 0 0  Worry too much - different things 0 0 0 0  Trouble relaxing 0 0 0 0  Restless 0 0 0 0  Easily annoyed or irritable 0 0 0 0  Afraid - awful might happen 0 0 0 0  Total GAD 7 Score 0 0 0 0  Anxiety Difficulty  Not difficult at all Not difficult at all Not difficult at all    Past Medical History:  Past Medical History:  Diagnosis Date   Arthritis    Clotting disorder (HCC)    DVT (deep venous thrombosis) (HCC)    GERD (gastroesophageal reflux disease)    Hyperlipidemia    Hypertension    Seizures (HCC)    Skin cancer    Systemic lupus erythematosus (HCC)     Surgical History:  Past Surgical History:  Procedure Laterality Date   EYE SURGERY     NO PAST SURGERIES      Medications:  Current Outpatient Medications on File Prior to Visit  Medication Sig   clobetasol (TEMOVATE) 0.05 % external solution Apply topically as needed.   HYDROcodone -acetaminophen  (NORCO/VICODIN) 5-325 MG tablet Take 1 tablet by mouth every 6 (six) hours as needed.   hydroxychloroquine (PLAQUENIL) 200 MG tablet Take 200 mg by mouth daily.   Multiple Vitamin (MULTIVITAMIN) tablet Take 1 tablet by mouth daily.   omeprazole  (PRILOSEC) 20 MG capsule Take 20 mg by mouth daily.   Polyethyl Glycol-Propyl Glycol (SYSTANE) 0.4-0.3 % SOLN Apply to eye.   sildenafil  (VIAGRA ) 100 MG tablet Take 0.5-1 tablets (50-100 mg total) by mouth daily as needed for erectile dysfunction.   tiZANidine  (ZANAFLEX ) 4 MG tablet Take 1 tablet (4 mg total) by mouth every 6 (six) hours as needed for muscle spasms.   No current  facility-administered medications on file prior to visit.    Allergies:  No Known Allergies  Social History:  Social History   Socioeconomic History   Marital status: Married    Spouse name: Not on file   Number of children: Not on file   Years of education: Not on file   Highest education level: Associate degree: occupational, Scientist, product/process development, or vocational program  Occupational History  Not on file  Tobacco Use   Smoking status: Former    Current packs/day: 0.00    Average packs/day: 1 pack/day for 43.0 years (43.0 ttl pk-yrs)    Types: Cigarettes    Start date: 08/31/1970    Quit date: 08/30/2013    Years since quitting: 10.1    Passive exposure: Never   Smokeless tobacco: Former    Types: Engineer, drilling   Vaping status: Every Day   Substances: Nicotine  Substance and Sexual Activity   Alcohol use: Yes    Alcohol/week: 8.0 - 10.0 standard drinks of alcohol    Types: 8 - 10 Cans of beer per week   Drug use: Never   Sexual activity: Yes  Other Topics Concern   Not on file  Social History Narrative   Not on file   Social Drivers of Health   Financial Resource Strain: Low Risk  (10/04/2023)   Overall Financial Resource Strain (CARDIA)    Difficulty of Paying Living Expenses: Not very hard  Food Insecurity: No Food Insecurity (10/04/2023)   Hunger Vital Sign    Worried About Running Out of Food in the Last Year: Never true    Ran Out of Food in the Last Year: Never true  Transportation Needs: No Transportation Needs (10/04/2023)   PRAPARE - Administrator, Civil Service (Medical): No    Lack of Transportation (Non-Medical): No  Physical Activity: Insufficiently Active (10/04/2023)   Exercise Vital Sign    Days of Exercise per Week: 4 days    Minutes of Exercise per Session: 30 min  Stress: No Stress Concern Present (10/04/2023)   [employer] of Occupational Health - Occupational Stress Questionnaire    Feeling of Stress: Only a little  Social Connections:  Moderately Integrated (10/04/2023)   Social Connection and Isolation Panel    Frequency of Communication with Friends and Family: More than three times a week    Frequency of Social Gatherings with Friends and Family: Once a week    Attends Religious Services: 1 to 4 times per year    Active Member of [employer] or Organizations: No    Attends Engineer, structural: Not on file    Marital Status: Married  Catering manager Violence: Not At Risk (10/07/2023)   Humiliation, Afraid, Rape, and Kick questionnaire    Fear of Current or Ex-Partner: No    Emotionally Abused: No    Physically Abused: No    Sexually Abused: No   Social History   Tobacco Use  Smoking Status Former   Current packs/day: 0.00   Average packs/day: 1 pack/day for 43.0 years (43.0 ttl pk-yrs)   Types: Cigarettes   Start date: 08/31/1970   Quit date: 08/30/2013   Years since quitting: 10.1   Passive exposure: Never  Smokeless Tobacco Former   Types: Sports administrator   Social History   Substance and Sexual Activity  Alcohol Use Yes   Alcohol/week: 8.0 - 10.0 standard drinks of alcohol   Types: 8 - 10 Cans of beer per week    Family History:  Family History  Problem Relation Age of Onset   Diabetes Mother    Hypertension Mother    Hyperlipidemia Mother    Stroke Mother    Lung disease Mother    Heart disease Mother    Heart disease Father    Lung disease Father    Diabetes Sister    Colon polyps Maternal Grandmother     Past  medical history, surgical history, medications, allergies, family history and social history reviewed with patient today and changes made to appropriate areas of the chart.   Review of Systems - negative All other ROS negative except what is listed above and in the HPI.      Objective:    BP 122/68 (BP Location: Left Arm, Patient Position: Sitting, Cuff Size: Normal)   Pulse 60   Temp 98.4 F (36.9 C) (Oral)   Resp 15   Ht 5' 10.98 (1.803 m)   Wt 199 lb (90.3 kg)   SpO2 97%   BMI  27.77 kg/m   Wt Readings from Last 3 Encounters:  10/07/23 199 lb (90.3 kg)  08/26/23 199 lb (90.3 kg)  04/15/23 205 lb (93 kg)    Physical Exam Vitals and nursing note reviewed.  Constitutional:      General: He is awake. He is not in acute distress.    Appearance: He is well-developed and well-groomed. He is not ill-appearing.  HENT:     Head: Normocephalic and atraumatic.     Right Ear: Hearing, ear canal and external ear normal. No drainage.     Left Ear: Hearing, ear canal and external ear normal. No drainage.     Nose: Nose normal.     Mouth/Throat:     Pharynx: Uvula midline.  Eyes:     General: Lids are normal.        Right eye: No discharge.        Left eye: No discharge.     Extraocular Movements: Extraocular movements intact.     Conjunctiva/sclera: Conjunctivae normal.     Pupils: Pupils are equal, round, and reactive to light.     Visual Fields: Right eye visual fields normal and left eye visual fields normal.  Neck:     Thyroid : No thyromegaly.     Vascular: No carotid bruit or JVD.     Trachea: Trachea normal.  Cardiovascular:     Rate and Rhythm: Normal rate and regular rhythm.     Heart sounds: Normal heart sounds, S1 normal and S2 normal. No murmur heard.    No gallop.  Pulmonary:     Effort: Pulmonary effort is normal. No accessory muscle usage or respiratory distress.     Breath sounds: Normal breath sounds.  Abdominal:     General: Bowel sounds are normal.     Palpations: Abdomen is soft. There is no hepatomegaly or splenomegaly.     Tenderness: There is no abdominal tenderness.  Musculoskeletal:        General: Normal range of motion.     Cervical back: Normal range of motion and neck supple.     Right lower leg: No edema.     Left lower leg: No edema.  Lymphadenopathy:     Head:     Right side of head: No submental, submandibular, tonsillar, preauricular or posterior auricular adenopathy.     Left side of head: No submental, submandibular,  tonsillar, preauricular or posterior auricular adenopathy.     Cervical: No cervical adenopathy.  Skin:    General: Skin is warm and dry.     Capillary Refill: Capillary refill takes less than 2 seconds.     Findings: No rash.  Neurological:     Mental Status: He is alert and oriented to person, place, and time.     Gait: Gait is intact.     Deep Tendon Reflexes: Reflexes are normal and symmetric.  Reflex Scores:      Brachioradialis reflexes are 2+ on the right side and 2+ on the left side.      Patellar reflexes are 2+ on the right side and 2+ on the left side. Psychiatric:        Attention and Perception: Attention normal.        Mood and Affect: Mood normal.        Speech: Speech normal.        Behavior: Behavior normal. Behavior is cooperative.        Thought Content: Thought content normal.        Cognition and Memory: Cognition normal.        Judgment: Judgment normal.    Results for orders placed or performed in visit on 03/25/23  Protein / creatinine ratio, urine   Collection Time: 03/25/23 12:04 PM  Result Value Ref Range   Creatinine, Urine 86 20 - 320 mg/dL   Protein/Creat Ratio 58 25 - 148 mg/g creat   Protein/Creatinine Ratio 0.058 0.025 - 0.148 mg/mg creat   Total Protein, Urine 5 5 - 25 mg/dL  CBC with Differential/Platelet   Collection Time: 03/25/23 12:04 PM  Result Value Ref Range   WBC 6.1 3.8 - 10.8 Thousand/uL   RBC 4.37 4.20 - 5.80 Million/uL   Hemoglobin 13.7 13.2 - 17.1 g/dL   HCT 59.1 61.4 - 49.9 %   MCV 93.4 80.0 - 100.0 fL   MCH 31.4 27.0 - 33.0 pg   MCHC 33.6 32.0 - 36.0 g/dL   RDW 87.8 88.9 - 84.9 %   Platelets 272 140 - 400 Thousand/uL   MPV 10.1 7.5 - 12.5 fL   Neutro Abs 4,014 1,500 - 7,800 cells/uL   Absolute Lymphocytes 1,159 850 - 3,900 cells/uL   Absolute Monocytes 665 200 - 950 cells/uL   Eosinophils Absolute 232 15 - 500 cells/uL   Basophils Absolute 31 0 - 200 cells/uL   Neutrophils Relative % 65.8 %   Total Lymphocyte 19.0  %   Monocytes Relative 10.9 %   Eosinophils Relative 3.8 %   Basophils Relative 0.5 %  COMPLETE METABOLIC PANEL WITH GFR   Collection Time: 03/25/23 12:04 PM  Result Value Ref Range   Glucose, Bld 81 65 - 99 mg/dL   BUN 11 7 - 25 mg/dL   Creat 9.12 9.29 - 8.64 mg/dL   eGFR 95 > OR = 60 fO/fpw/8.26f7   BUN/Creatinine Ratio SEE NOTE: 6 - 22 (calc)   Sodium 137 135 - 146 mmol/L   Potassium 4.3 3.5 - 5.3 mmol/L   Chloride 99 98 - 110 mmol/L   CO2 28 20 - 32 mmol/L   Calcium  10.0 8.6 - 10.3 mg/dL   Total Protein 7.3 6.1 - 8.1 g/dL   Albumin 4.7 3.6 - 5.1 g/dL   Globulin 2.6 1.9 - 3.7 g/dL (calc)   AG Ratio 1.8 1.0 - 2.5 (calc)   Total Bilirubin 0.4 0.2 - 1.2 mg/dL   Alkaline phosphatase (APISO) 78 35 - 144 U/L   AST 21 10 - 35 U/L   ALT 22 9 - 46 U/L  ANA   Collection Time: 03/25/23 12:04 PM  Result Value Ref Range   Anti Nuclear Antibody (ANA) POSITIVE (A) NEGATIVE  Anti-DNA antibody, double-stranded   Collection Time: 03/25/23 12:04 PM  Result Value Ref Range   ds DNA Ab 4 IU/mL  C3 and C4   Collection Time: 03/25/23 12:04 PM  Result Value Ref Range  C3 Complement 141 82 - 185 mg/dL   C4 Complement 27 15 - 53 mg/dL  Sedimentation rate   Collection Time: 03/25/23 12:04 PM  Result Value Ref Range   Sed Rate 14 0 - 20 mm/h  Anti-nuclear ab-titer (ANA titer)   Collection Time: 03/25/23 12:04 PM  Result Value Ref Range   ANA Titer 1 1:40 (H) titer   ANA Pattern 1 Nuclear, Discrete Nuclear Dots (A)       Assessment & Plan:   Problem List Items Addressed This Visit       Cardiovascular and Mediastinum   Hypertension   Chronic, stable.  BP well below goal today.  Have recommend he monitor BP at least 3 times a week and document for next visit as we may be able to reduce BP medications.  Discussed at length with him.  Continue current medication regimen and adjust as needed.  Focus on DASH diet.  LABS: CBC, CMP, TSH.  Refills up to date. Return in 6 months.       Relevant Medications   amLODipine  (NORVASC ) 5 MG tablet   atorvastatin  (LIPITOR) 20 MG tablet   hydrochlorothiazide  (HYDRODIURIL ) 25 MG tablet   lisinopril  (ZESTRIL ) 20 MG tablet   rivaroxaban  (XARELTO ) 20 MG TABS tablet   Other Relevant Orders   Comprehensive metabolic panel with GFR   TSH   Lipid Panel w/o Chol/HDL Ratio   Enlarged pulmonary artery (HCC)   Noted on lung CT this year, 2025. Will obtain echo and place referral to pulmonary for further assessment and recommendations.  Has underlying lupus which places higher risk for PH.  Educated him on findings.      Relevant Medications   amLODipine  (NORVASC ) 5 MG tablet   atorvastatin  (LIPITOR) 20 MG tablet   hydrochlorothiazide  (HYDRODIURIL ) 25 MG tablet   lisinopril  (ZESTRIL ) 20 MG tablet   rivaroxaban  (XARELTO ) 20 MG TABS tablet   Other Relevant Orders   ECHOCARDIOGRAM COMPLETE   Ambulatory referral to Pulmonology     Respiratory   Emphysema lung (HCC) - Primary   Chronic, stable. Note on lung cancer screening.  Educated him on this finding.  Recommend he cut back on vaping.  No current inhalers and denies symptoms.  Continue annual lung cancer screening. Referral to pulmonary placed due to enlarged pulmonary trunk.      Relevant Orders   CBC with Differential/Platelet   ECHOCARDIOGRAM COMPLETE   Ambulatory referral to Pulmonology     Digestive   Gastroesophageal reflux disease without esophagitis   Chronic, stable with Omeprazole .  Continue current medication regimen and consider reduction in future.  Mag level annually.  Discussed the risks and benefits of long term PPI use including but not limited to bone loss, chronic kidney disease, infections, low magnesium.  Will aim to use at the lowest dose for the shortest period of time.  He has tried reduction without success.      Relevant Orders   Magnesium     Hematopoietic and Hemostatic   Other thrombophilia (HCC)   With long term anticoagulant use.  Monitor CBC  regularly and watch for increased bleeding or bruising.      Relevant Orders   CBC with Differential/Platelet     Other   SLE (systemic lupus erythematosus) (HCC)   Chronic, stable.  Will continue collaboration with his rheumatologist Dr. Dolphus.  Continue current medication regimen as prescribed by them.  Recent note and labs reviewed. Will obtain labs ordered by them outpatient and send to  them for review.      Relevant Orders   CBC with Differential/Platelet   Comprehensive metabolic panel with GFR   Anti-DNA antibody, double-stranded   Sed Rate (ESR)   C3 and C4   Protein / Creatinine Ratio, Urine   ECHOCARDIOGRAM COMPLETE   Ambulatory referral to Pulmonology   Hyperlipidemia   Chronic, ongoing.  Continue current medication regimen and adjust as needed.  Lipid panel today.      Relevant Medications   amLODipine  (NORVASC ) 5 MG tablet   atorvastatin  (LIPITOR) 20 MG tablet   hydrochlorothiazide  (HYDRODIURIL ) 25 MG tablet   lisinopril  (ZESTRIL ) 20 MG tablet   rivaroxaban  (XARELTO ) 20 MG TABS tablet   Other Relevant Orders   Comprehensive metabolic panel with GFR   Lipid Panel w/o Chol/HDL Ratio   History of DVT (deep vein thrombosis)   Chronic, stable on Xarelto .  Hematology consult in 2020 and was able to transition to Xarelto , which he has taken without issue and has offered benefit to lifestyle.  CMP today.      Current every day nicotine vaping   I have recommended complete cessation of nicotine use. I have discussed various options available for assistance with tobacco cessation including over the counter methods (Nicotine gum, patch and lozenges). We also discussed prescription options (Chantix, Nicotine Inhaler / Nasal Spray). The patient is not interested in pursuing any prescription tobacco cessation options at this time.        Other Visit Diagnoses       Benign prostatic hyperplasia without lower urinary tract symptoms       PSA on labs today.   Relevant  Orders   PSA     Flu vaccine need       Flu vaccine today.   Relevant Orders   Flu vaccine HIGH DOSE PF(Fluzone Trivalent)     Screening for colon cancer       Cologuard ordered.   Relevant Orders   Cologuard     Encounter for annual physical exam       Annual physical today with labs and health maintenance reviewed, discussed with patient.       Discussed aspirin prophylaxis for myocardial infarction prevention and decision was it was not indicated  LABORATORY TESTING:  Health maintenance labs ordered today as discussed above.   The natural history of prostate cancer and ongoing controversy regarding screening and potential treatment outcomes of prostate cancer has been discussed with the patient. The meaning of a false positive PSA and a false negative PSA has been discussed. He indicates understanding of the limitations of this screening test and wishes to proceed with screening PSA testing.   IMMUNIZATIONS:   - Tdap: Tetanus vaccination status reviewed: last tetanus booster within 10 years -- due next 08/27/2026 - Influenza: Up To Date - Pneumovax: Up To Date - Prevnar: Up To Date - Zostavax vaccine: will get at [facility]  SCREENING: - Colonoscopy: Cologuard ordered Discussed with patient purpose of the colonoscopy is to detect colon cancer at curable precancerous or early stages   - AAA Screening: educated on and think about -Hearing Test: Not applicable  -Spirometry: Not applicable   PATIENT COUNSELING:    Sexuality: Discussed sexually transmitted diseases, partner selection, use of condoms, avoidance of unintended pregnancy  and contraceptive alternatives.   Advised to avoid cigarette smoking.  I discussed with the patient that most people either abstain from alcohol or drink within safe limits (<=14/week and <=4 drinks/occasion for males, <=7/weeks and <=  3 drinks/occasion for females) and that the risk for alcohol disorders and other health effects rises  proportionally with the number of drinks per week and how often a drinker exceeds daily limits.  Discussed cessation/primary prevention of drug use and availability of treatment for abuse.   Diet: Encouraged to adjust caloric intake to maintain  or achieve ideal body weight, to reduce intake of dietary saturated fat and total fat, to limit sodium intake by avoiding high sodium foods and not adding table salt, and to maintain adequate dietary potassium and calcium  preferably from fresh fruits, vegetables, and low-fat dairy products.    Stressed the importance of regular exercise  Injury prevention: Discussed safety belts, safety helmets, smoke detector, smoking near bedding or upholstery.   Dental health: Discussed importance of regular tooth brushing, flossing, and dental visits.   Follow up plan: NEXT PREVENTATIVE PHYSICAL DUE IN 1 YEAR. Return in about 6 months (around 04/05/2024) for HTN/HLD, COPD, Lupus.

## 2023-10-07 NOTE — Assessment & Plan Note (Signed)
 Chronic, ongoing.  Continue current medication regimen and adjust as needed. Lipid panel today.

## 2023-10-07 NOTE — Assessment & Plan Note (Signed)
I have recommended complete cessation of nicotine use. I have discussed various options available for assistance with tobacco cessation including over the counter methods (Nicotine gum, patch and lozenges). We also discussed prescription options (Chantix, Nicotine Inhaler / Nasal Spray). The patient is not interested in pursuing any prescription tobacco cessation options at this time.  

## 2023-10-07 NOTE — Assessment & Plan Note (Signed)
Chronic, stable on Xarelto.  Hematology consult in 2020 and was able to transition to Xarelto, which he has taken without issue and has offered benefit to lifestyle.  CMP today.

## 2023-10-07 NOTE — Assessment & Plan Note (Signed)
 Chronic, stable. Note on lung cancer screening.  Educated him on this finding.  Recommend he cut back on vaping.  No current inhalers and denies symptoms.  Continue annual lung cancer screening. Referral to pulmonary placed due to enlarged pulmonary trunk.

## 2023-10-07 NOTE — Assessment & Plan Note (Signed)
 Chronic, stable.  BP well below goal today.  Have recommend he monitor BP at least 3 times a week and document for next visit as we may be able to reduce BP medications.  Discussed at length with him.  Continue current medication regimen and adjust as needed.  Focus on DASH diet.  LABS: CBC, CMP, TSH.  Refills up to date. Return in 6 months.

## 2023-10-07 NOTE — Assessment & Plan Note (Signed)
 Noted on lung CT this year, 2025. Will obtain echo and place referral to pulmonary for further assessment and recommendations.  Has underlying lupus which places higher risk for PH.  Educated him on findings.

## 2023-10-07 NOTE — Assessment & Plan Note (Signed)
 Chronic, stable.  Will continue collaboration with his rheumatologist Dr. Dolphus.  Continue current medication regimen as prescribed by them.  Recent note and labs reviewed. Will obtain labs ordered by them outpatient and send to them for review.

## 2023-10-07 NOTE — Assessment & Plan Note (Signed)
With long term anticoagulant use.  Monitor CBC regularly and watch for increased bleeding or bruising. 

## 2023-10-07 NOTE — Assessment & Plan Note (Signed)
Chronic, stable with Omeprazole.  Continue current medication regimen and consider reduction in future.  Mag level annually.  Discussed the risks and benefits of long term PPI use including but not limited to bone loss, chronic kidney disease, infections, low magnesium.  Will aim to use at the lowest dose for the shortest period of time.  He has tried reduction without success.

## 2023-10-08 ENCOUNTER — Ambulatory Visit: Payer: Self-pay | Admitting: Nurse Practitioner

## 2023-10-08 LAB — PROTEIN / CREATININE RATIO, URINE
Creatinine, Urine: 21.9 mg/dL
Protein, Ur: <4 mg/dL

## 2023-10-08 NOTE — Progress Notes (Signed)
 Contacted via MyChart  Good morning Dwayne Jacobson, your labs have returned with exception of a couple still pending. Overall current labs are stable.  Lipid panel shows LDL slightly above goal for stroke prevention, I would recommend increasing your Atorvastatin  to 40 MG and stopping 20 MG.  Would you be okay with this? Let me know.  As soon as remainder of labs return I will send to rheumatology. Any questions? Keep being stellar!!  Thank you for allowing me to participate in your care.  I appreciate you. Kindest regards, Oluwasemilore Pascuzzi

## 2023-10-09 LAB — COMPREHENSIVE METABOLIC PANEL WITH GFR
ALT: 31 IU/L (ref 0–44)
AST: 29 IU/L (ref 0–40)
Albumin: 4.6 g/dL (ref 3.9–4.9)
Alkaline Phosphatase: 77 IU/L (ref 44–121)
BUN/Creatinine Ratio: 10 (ref 10–24)
BUN: 9 mg/dL (ref 8–27)
Bilirubin Total: 0.2 mg/dL (ref 0.0–1.2)
CO2: 25 mmol/L (ref 20–29)
Calcium: 10.1 mg/dL (ref 8.6–10.2)
Chloride: 95 mmol/L — ABNORMAL LOW (ref 96–106)
Creatinine, Ser: 0.93 mg/dL (ref 0.76–1.27)
Globulin, Total: 2.7 g/dL (ref 1.5–4.5)
Glucose: 78 mg/dL (ref 70–99)
Potassium: 4.1 mmol/L (ref 3.5–5.2)
Sodium: 135 mmol/L (ref 134–144)
Total Protein: 7.3 g/dL (ref 6.0–8.5)
eGFR: 89 mL/min/1.73 (ref >59)

## 2023-10-09 LAB — CBC WITH DIFFERENTIAL/PLATELET
Basophils Absolute: 0.1 x10E3/uL (ref 0.0–0.2)
Basos: 1 %
EOS (ABSOLUTE): 0.1 x10E3/uL (ref 0.0–0.4)
Eos: 2 %
Hematocrit: 41.3 % (ref 37.5–51.0)
Hemoglobin: 13.6 g/dL (ref 13.0–17.7)
Immature Grans (Abs): 0 x10E3/uL (ref 0.0–0.1)
Immature Granulocytes: 0 %
Lymphocytes Absolute: 0.9 x10E3/uL (ref 0.7–3.1)
Lymphs: 17 %
MCH: 30.8 pg (ref 26.6–33.0)
MCHC: 32.9 g/dL (ref 31.5–35.7)
MCV: 93 fL (ref 79–97)
Monocytes Absolute: 0.8 x10E3/uL (ref 0.1–0.9)
Monocytes: 14 %
Neutrophils Absolute: 3.5 x10E3/uL (ref 1.4–7.0)
Neutrophils: 66 %
Platelets: 311 x10E3/uL (ref 150–450)
RBC: 4.42 x10E6/uL (ref 4.14–5.80)
RDW: 13.1 % (ref 11.6–15.4)
WBC: 5.4 x10E3/uL (ref 3.4–10.8)

## 2023-10-09 LAB — C3 AND C4
Complement C3, Serum: 134 mg/dL (ref 82–167)
Complement C4, Serum: 26 mg/dL (ref 12–38)

## 2023-10-09 LAB — MAGNESIUM: Magnesium: 1.9 mg/dL (ref 1.6–2.3)

## 2023-10-09 LAB — LIPID PANEL W/O CHOL/HDL RATIO
Cholesterol, Total: 155 mg/dL (ref 100–199)
HDL: 48 mg/dL (ref >39)
LDL Chol Calc (NIH): 80 mg/dL (ref 0–99)
Triglycerides: 155 mg/dL — ABNORMAL HIGH (ref 0–149)
VLDL Cholesterol Cal: 27 mg/dL (ref 5–40)

## 2023-10-09 LAB — TSH: TSH: 1.96 u[IU]/mL (ref 0.450–4.500)

## 2023-10-09 LAB — SEDIMENTATION RATE: Sed Rate: 16 mm/h (ref 0–30)

## 2023-10-09 LAB — PSA: Prostate Specific Ag, Serum: 0.9 ng/mL (ref 0.0–4.0)

## 2023-10-09 LAB — ANTI-DNA ANTIBODY, DOUBLE-STRANDED: dsDNA Ab: 4 [IU]/mL (ref 0–9)

## 2023-10-09 NOTE — Progress Notes (Signed)
 No proteinuria.  ESR WNL dsDNA negative  Complements WNL.   Labs are not consistent with a flare. No change in therapy recommended at this time.

## 2023-10-12 MED ORDER — ATORVASTATIN CALCIUM 40 MG PO TABS: 40.0000 mg | ORAL_TABLET | Freq: Every day | ORAL | 3 refills | Status: AC

## 2023-10-17 ENCOUNTER — Ambulatory Visit: Admitting: Neurosurgery

## 2023-10-17 ENCOUNTER — Encounter: Payer: Self-pay | Admitting: Neurosurgery

## 2023-10-17 VITALS — BP 126/72 | Ht 71.0 in | Wt 200.0 lb

## 2023-10-17 DIAGNOSIS — M4802 Spinal stenosis, cervical region: Secondary | ICD-10-CM | POA: Diagnosis not present

## 2023-10-17 NOTE — Progress Notes (Signed)
 Referring Physician:  Valerio Melanie DASEN, NP [address] [place],  [place] [address]  Primary Physician:  Dwayne Melanie DASEN, NP  History of Present Illness: 10/17/2023 [name] is here today with a chief complaint of cervical spondylosis.  He has a history of cervical stenosis and neck pain.  He has had 2 flares.  He had a previous flare that was quite severe.  Thankfully has had a significant improvement overall.  At this point he is not having any severe spondylitic pain or radicular pain.  Overall he feels that he is doing pretty well.  I have utilized the care everywhere function in epic to review the outside records available from external health systems.  Review of Systems:  A 10 point review of systems is negative, except for the pertinent positives and negatives detailed in the HPI.  Past Medical History: Past Medical History:  Diagnosis Date   Arthritis    Clotting disorder (HCC)    DVT (deep venous thrombosis) (HCC)    GERD (gastroesophageal reflux disease)    Hyperlipidemia    Hypertension    Seizures (HCC)    Skin cancer    Systemic lupus erythematosus (HCC)     Past Surgical History: Past Surgical History:  Procedure Laterality Date   EYE SURGERY     NO PAST SURGERIES      Allergies: Allergies as of 10/17/2023   (No Known Allergies)    Medications:  Current Outpatient Medications:    amLODipine  (NORVASC ) 5 MG tablet, Take 1 tablet (5 mg total) by mouth daily., Disp: 90 tablet, Rfl: 4   atorvastatin  (LIPITOR) 40 MG tablet, Take 1 tablet (40 mg total) by mouth daily., Disp: 90 tablet, Rfl: 3   clobetasol (TEMOVATE) 0.05 % external solution, Apply topically as needed., Disp: , Rfl:    hydrochlorothiazide  (HYDRODIURIL ) 25 MG tablet, Take 0.5 tablets (12.5 mg total) by mouth daily., Disp: 45 tablet, Rfl: 4   HYDROcodone -acetaminophen  (NORCO/VICODIN) 5-325 MG tablet, Take 1 tablet by mouth every 6 (six) hours as needed., Disp: , Rfl:     hydroxychloroquine (PLAQUENIL) 200 MG tablet, Take 200 mg by mouth daily., Disp: , Rfl:    lisinopril  (ZESTRIL ) 20 MG tablet, Take 1 tablet (20 mg total) by mouth daily., Disp: 90 tablet, Rfl: 4   Multiple Vitamin (MULTIVITAMIN) tablet, Take 1 tablet by mouth daily., Disp: , Rfl:    omeprazole  (PRILOSEC) 20 MG capsule, Take 20 mg by mouth daily., Disp: , Rfl:    Polyethyl Glycol-Propyl Glycol (SYSTANE) 0.4-0.3 % SOLN, Apply to eye., Disp: , Rfl:    rivaroxaban  (XARELTO ) 20 MG TABS tablet, Take 1 tablet (20 mg total) by mouth daily with supper., Disp: 90 tablet, Rfl: 4   sildenafil  (VIAGRA ) 100 MG tablet, Take 0.5-1 tablets (50-100 mg total) by mouth daily as needed for erectile dysfunction., Disp: 5 tablet, Rfl: 11  Social History: Social History   Tobacco Use   Smoking status: Former    Current packs/day: 0.00    Average packs/day: 1 pack/day for 43.0 years (43.0 ttl pk-yrs)    Types: Cigarettes    Start date: 08/31/1970    Quit date: 08/30/2013    Years since quitting: 10.1    Passive exposure: Never   Smokeless tobacco: Former    Types: Engineer, drilling   Vaping status: Every Day   Substances: Nicotine  Substance Use Topics   Alcohol use: Yes    Alcohol/week: 8.0 - 10.0 standard drinks of  alcohol    Types: 8 - 10 Cans of beer per week   Drug use: Never    Family Medical History: Family History  Problem Relation Age of Onset   Diabetes Mother    Hypertension Mother    Hyperlipidemia Mother    Stroke Mother    Lung disease Mother    Heart disease Mother    Heart disease Father    Lung disease Father    Diabetes Sister    Colon polyps Maternal Grandmother     Physical Examination: Vitals:   10/17/23 0918  BP: 126/72    General: Patient is in no apparent distress. Attention to examination is appropriate.  Neck:   Supple.  Full range of motion.  Respiratory: Patient is breathing without any difficulty.   NEUROLOGICAL:     Awake, alert, oriented to person,  place, and time.  Speech is clear and fluent.   Cranial Nerves: Pupils equal round and reactive to light.  Facial tone is symmetric.  Facial sensation is symmetric. Shoulder shrug is symmetric. Tongue protrusion is midline.    Strength: Side Biceps Triceps Deltoid Interossei Grip Wrist Ext. Wrist Flex.  R 5 5 5 5 5 5 5   L 5 5 5 5 5 5 5    Side Iliopsoas Quads Hamstring PF DF EHL  R 5 5 5 5 5 5   L 5 5 5 5 5 5    He has no evidence of pathologic reflexes.  2+ at the bilateral biceps brachial radialis as well as his knees.  No since patient loss noted     No evidence of dysmetria noted.  Gait is normal.    Imaging: Narrative & Impression  CLINICAL DATA:  Cervical radiculopathy, no red flags Had x-ray imaging months back noting degenerative changes, pain is present and not improving   EXAM: MRI CERVICAL SPINE WITHOUT CONTRAST   TECHNIQUE: Multiplanar, multisequence MR imaging of the cervical spine was performed. No intravenous contrast was administered.   COMPARISON:  None Available.   FINDINGS: Alignment: Straightening of the normal cervical lordosis. Trace retrolisthesis C4 on C5.   Vertebrae: No fracture, evidence of discitis, or bone lesion. T2 hyperintense signal in the C5-C6 disc space is favored to be degenerative.   Cord: Normal signal and morphology.   Posterior Fossa, vertebral arteries, paraspinal tissues: Negative.   Disc levels:   C1-C2: No significant degenerative change.   C2-C3: Mild bilateral facet degenerative change. No spinal canal narrowing. No neural foraminal narrowing.   C3-C4: Mild bilateral facet degenerative change. No spinal canal narrowing. No neural foraminal narrowing.   C4-C5: Circumferential disc bulge. Moderate to severe spinal canal narrowing. Uncovertebral hypertrophy. Moderate left and mild right facet degenerative change. Severe left and moderate right neural foraminal narrowing.   C5-C6: Circumferential disc bulge.  Moderate to severe spinal canal narrowing. Uncovertebral hypertrophy. Moderate to severe bilateral neural foraminal narrowing.   C6-C7: Minimal disc bulge. Uncovertebral hypertrophy. Mild bilateral facet degenerative change. Moderate bilateral neural foraminal narrowing.   C7-T1: Mild bilateral facet degenerative change. No spinal canal or neural foraminal narrowing.   IMPRESSION: Moderate to severe spinal canal and bilateral neuroforaminal narrowing at C4-C5 and C5-C6.     Electronically Signed   By: Lyndall Gore M.D.   On: 02/11/2023 11:34    Medical Decision Making/Assessment and Plan: [name] is a pleasant 68 y.o. male with history of neck pain and soreness with 2 flares of left-sided cervical radiculopathy.  He is here today for a maintenance  check.  At this point he continues to have good strength with no evidence of progressive cervical radiculopathy or cervical myelopathy.  We have been continuing to follow him for development of any symptomatology.  He does have known degenerative disease on his cervical imaging, however he has had 2 previous flares and has had significant improvement since that time.  He is not having any residual symptoms other than some neck pain with standing.  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  This was explained to him that he is often having to use his neck and shoulder muscles to keep his head into a good position for posture and that this often can lead to tightness and neck pain as well as fatigue and headaches.   He would like to continue to follow with conservative care.  At this point not interested in any surgery to fix his sagittal deformity.  Will continue to follow him.  He can come back yearly for myelopathy examinations.  Thank you for involving me in the care of this patient.    Penne MICAEL Sharps  MD/MSCR Neurosurgery

## 2023-10-22 LAB — COLOGUARD: COLOGUARD: NEGATIVE

## 2023-11-22 ENCOUNTER — Encounter: Payer: Self-pay | Admitting: Nurse Practitioner

## 2023-11-22 MED ORDER — RIVAROXABAN 20 MG PO TABS: 20.0000 mg | ORAL_TABLET | Freq: Every day | ORAL | 4 refills | Status: AC

## 2023-11-23 ENCOUNTER — Ambulatory Visit
Admission: RE | Admit: 2023-11-23 | Discharge: 2023-11-23 | Disposition: A | Discharge status: Home / Self Care | Source: Ambulatory Visit | Attending: Nurse Practitioner | Admitting: Nurse Practitioner

## 2023-11-23 DIAGNOSIS — I10 Essential (primary) hypertension: Secondary | ICD-10-CM | POA: Diagnosis not present

## 2023-11-23 DIAGNOSIS — I272 Pulmonary hypertension, unspecified: Secondary | ICD-10-CM | POA: Insufficient documentation

## 2023-11-23 DIAGNOSIS — J432 Centrilobular emphysema: Secondary | ICD-10-CM

## 2023-11-23 DIAGNOSIS — I081 Rheumatic disorders of both mitral and tricuspid valves: Secondary | ICD-10-CM | POA: Diagnosis not present

## 2023-11-23 DIAGNOSIS — E785 Hyperlipidemia, unspecified: Secondary | ICD-10-CM | POA: Diagnosis not present

## 2023-11-23 DIAGNOSIS — I288 Other diseases of pulmonary vessels: Secondary | ICD-10-CM | POA: Diagnosis not present

## 2023-11-23 DIAGNOSIS — M3219 Other organ or system involvement in systemic lupus erythematosus: Secondary | ICD-10-CM

## 2023-11-23 LAB — ECHOCARDIOGRAM COMPLETE
AR max vel: 3.76 cm2
AV Area VTI: 3.78 cm2
AV Area mean vel: 3.52 cm2
AV Mean grad: 2 mmHg
AV Peak grad: 4.2 mmHg
Ao pk vel: 1.03 m/s
Area-P 1/2: 2.83 cm2
MV VTI: 3.04 cm2
S' Lateral: 3.4 cm

## 2023-11-23 NOTE — Progress Notes (Signed)
*  PRELIMINARY RESULTS* Echocardiogram 2D Echocardiogram has been performed.  Dwayne Jacobson 11/23/2023, 10:41 AM

## 2023-11-23 NOTE — Progress Notes (Signed)
*  PRELIMINARY RESULTS* Echocardiogram 2D Echocardiogram has been performed.  Dwayne Jacobson 11/23/2023, 10:40 AM

## 2023-11-23 NOTE — Progress Notes (Signed)
 Contacted via MyChart  Good evening Dwayne Jacobson, your echo has returned and overall is reassuring. EF (pump of heart) is good at 60 to 65%. Left and right ventricles have normal function. You have mild regurgitation (back flow) of blood at mitral valve, this we can monitor. Pulmonary valve normal. We can continue to monitor and could consider referral to pulmonary in future if concerns for pulmonary hypertension as noted on recent CT. Currently heart is not showing any affect from this.  Any questions? Keep being amazing!!  Thank you for allowing me to participate in your care.  I appreciate you. Kindest regards, Atsushi Yom

## 2023-11-25 ENCOUNTER — Ambulatory Visit: Admitting: Pulmonary Disease

## 2023-11-30 ENCOUNTER — Encounter: Payer: Self-pay | Admitting: Student in an Organized Health Care Education/Training Program

## 2023-11-30 ENCOUNTER — Ambulatory Visit: Admitting: Student in an Organized Health Care Education/Training Program

## 2023-11-30 VITALS — BP 120/62 | HR 63 | Temp 98.5°F | Ht 71.0 in | Wt 203.8 lb

## 2023-11-30 DIAGNOSIS — I288 Other diseases of pulmonary vessels: Secondary | ICD-10-CM | POA: Diagnosis not present

## 2023-11-30 NOTE — Patient Instructions (Signed)
 VISIT SUMMARY: You came in today for an evaluation of your pulmonary hypertension. We reviewed your recent CT scan and echocardiogram results, and discussed your history of lupus, smoking, and vaping. We also talked about your emphysema diagnosis and nicotine dependence.  YOUR PLAN: -SUSPECTED PULMONARY HYPERTENSION: Pulmonary hypertension is high blood pressure in the arteries of your lungs. Your CT scan showed an enlarged pulmonary trunk, which suggests pulmonary hypertension. Although your echocardiogram showed normal heart function, we need to do a pulmonary function test to see how well your lungs are working. Depending on the results, we may need to refer you to a cardiologist or continue monitoring with noninvasive tests.  -EMPHYSEMA: Emphysema is a lung condition that causes shortness of breath. It was diagnosed through your CT scan. Even though you don't have current respiratory symptoms, it's important to monitor your lung health, especially given your history of smoking and vaping.  -NICOTINE DEPENDENCE (CIGARETTES AND VAPING): Nicotine dependence means you have a strong craving for nicotine, which you get from vaping. You smoked for 40 years before switching to vaping in 2015. Quitting nicotine can be challenging, but it's important for your overall health. We can discuss strategies to help you quit if you're interested.  INSTRUCTIONS: Please schedule a pulmonary function test to assess your lung function. Based on the results, we may need to refer you to a cardiologist or continue monitoring with noninvasive tests. Continue to monitor your lung health and consider strategies to quit vaping. Follow up with your primary care provider as needed.     The [facility]  Quitline: Call 1-800-QUIT-NOW ((817)381-9889). The [place] Quitline is a free service for [place]  residents. Trained counselors are available from 8 am until 3 am, 365 days per year. Services are available in both  English and Spanish.   Web Resources Free online support programs can help you track your progress and share experiences with others who are quitting. These are examples: www.becomeanex.org www.trytostop.org  www.smokefree.gov  www.https://www.vargas.com/.aspx    Tobacco Cessation Medications  Nicotine Replacement Therapy (NRT)  Nicotine is the addictive part of tobacco smoke, but not the most dangerous part. There are 7000 other toxins in cigarettes, including carbon monoxide, that cause disease. People do not generally become addicted to medication. Common problems: People don't use enough medication or stop too early. Medications are safe and effective. Overdose is very uncommon. Use medications as long as needed (3 months minimum). Some combinations work better than single medications. Long acting medications like the NRT patch and bupropion provide continuous treatment for withdrawal symptoms.  PLUS  Short acting medications like the NRT gum, lozenge, inhaler, and nasal spray help people to cope with breakthrough cravings.  ? Nicotine Patch  Place patch on hairless skin on upper body, including arms and back. Each day: discard old patch, shower, apply new patch to a different site. Apply hydrocortisone cream to mildly red/irritated areas. Call provider if rash develops. If patch causes sleep disturbance, remove patch at bedtime and replace each morning after shower. Side effects may include: skin irritation, headache, insomnia, abnormal/vivid dreams.  ? Nicotine Gum  Chew gum slowly, park in cheek when peppery taste or tingling sensation begins (about 15-30 chews). When taste or tingling goes away, begin chewing again. Use until nicotine is gone (taste or tingle does not return, usually 30 minutes). Park in different areas of mouth. Nicotine is absorbed through the lining of the mouth. Use enough to control cravings, up to 24 pieces per day (if used  alone).  Avoid eating or drinking for 15 minutes before using and during use. Side effects may include: mouth/jaw soreness, hiccups, indigestion, hypersalivation.  If gum is not chewed correctly, additional side effects may include lightheadedness, nausea/vomiting, throat and mouth irritation.  ? Nicotine Lozenge  Allow to dissolve slowly in mouth (20-30 minutes). Do not chew or swallow. Nicotine release may cause a warm tingling sensation. Occasionally rotate to different areas of the mouth. Use enough to control cravings, up to 20 lozenges per day (if used alone). Avoid eating or drinking for 15 minutes before using and during use. Side effects may include: nausea, hiccups, cough, heartburn, headache, gas, insomnia.  ? Nicotine Nasal Spray Use 1 spray in each nostril (1 dose) and tilt head back for 1 minute. Do not sniff, swallow, or inhale through nose.  Use at least 8 doses (1 spray in each nostril) , up to 40 doses per day (if used alone). To reduce nasal irritation, spray on cotton swab and insert into nose. Side effects may include: nasal and/or throat irritation (hot, peppery, or burning sensation), nasal irritation, tearing, sneezing, cough, headache.  ? Nicotine Oral Inhaler (puffer) Inhale into the back of the throat or puff in short breaths. Do not inhale into the lungs.  Puff continuously for 20 minutes (about 80 puffs) until cartridge is empty. Change cartridge when it loses the "burning in throat" sensation (feels like air only). Open cartridges can be saved and used again within 24 hours. Use at least 6 and up to 16 cartridges per day (if used alone).  Avoid eating or drinking for 15 minutes before using and during use. Side effects may include: mouth and/or throat irritation, unpleasant taste, cough, nasal irritation, indigestion, hiccups, headache.  ? Chantix (varenicline) Days 1-3: Take one 0.5 mg white pill each morning for 3 days, one week before quit date. Days 4-7:  Increase to one 0.5 mg white pill twice a day in morning and evening for 4 days.  On Day 8 (target quit date), increase to one 1 mg blue pill twice a day. Maintain this dose for a minimum of 3 months. Take with food and a full glass of water to reduce nausea. Be sure that the two doses are at least 8 hours apart, but try to take second dose early in the evening (i.e. 6 pm) to avoid sleep problems. Common side effects include: nausea, insomnia, headache, abnormal/vivid dreams. Tell your doctor if you have any history of psychiatric illness prior to starting Chantix.  STOP taking CHANTIX and contact a healthcare provider immediately if you experience agitation, hostility, depressed mood, changes in thoughts or behavior that are not typical for you, thinking about or attempting suicide, allergic or skin reactions including swelling, rash, redness, or peeling of the skin.  For patients who have heart disease: Smoking is a major risk factor for cardiovascular disease, and Chantix can help you quit smoking. Chantix may be associated with a small, increased risk of certain heart events in patients who have heart disease. If you have any new or worsening symptoms of heart disease while taking Chantix, such as shortness of breath or trouble breathing, new or worsening chest pain, or new or worsening pain in your legs when walking, call your doctor or get emergency medical help immediately.  ? Wellbutrin / Zyban (bupropion) Take one 150 mg pill each morning for 3 days, one week before target quit date. On Day 4, increase to one 150 mg pill twice a day, morning and evening.  Maintain this dose for a minimum of 3 months. Be sure that the two doses are at least 8 hours apart, but try to take second dose early in the evening (i.e. 6 pm) to avoid sleep problems. Avoid or minimize use of alcohol when taking this medication. Common side effects include: dry mouth, headache, insomnia, nausea, weight loss.  Risk of  seizure is 02/998. STOP taking BUPROPION and contact a healthcare provider immediately if you experience agitation, hostility, depressed mood, changes in thoughts or behavior that are not typical for you, thinking about or attempting suicide, allergic or skin reactions including swelling, rash, redness, or peeling of the skin.

## 2023-11-30 NOTE — Progress Notes (Signed)
 Assessment & Plan:   #Enlarged pulmonary artery (HCC) (Primary) #Suspected pulmonary hypertension    An enlarged pulmonary trunk on a June 2025 CT scan could be a sign of PAH, though his lack of symptoms argues against it. An echocardiogram on November 23, 2023, showed normal RV and LV function with grade one diastolic dysfunction, but PA pressures could not be estimated. He has no symptoms of dyspnea, peripheral edema, or exercise intolerance. Given his history of SLE, it is prudent to further evaluate him for [facility] despite the lack of symptoms. We will schedule a pulmonary function test to assess diffusion capacity for any signs of abnormal drop. If normal, no further action is needed. If significantly abnormal, will consider cardiology referral vs repeat PFT's to trend it.  - Pulmonary Function Test; Future  #Emphysema    Diagnosed via CT scan in June 2025. He has a significant smoking history and currently vapes. No current respiratory symptoms, but an infrequent cough is noted. Will assess PFT's for any airway obstruction.  #Nicotine dependence (cigarettes and vaping)    He smoked for 40 years, quit in 2015, and switched to vaping. He currently vapes tobacco-flavored juice with nicotine frequently and acknowledges difficulty in quitting nicotine. Encouraged him to consider vape cessation. Will provide with informational packet to help with smoking cessation. Offered nicotine replacement products, patient defers for now.     Return in about 4 weeks (around 12/28/2023).  Belva November, MD [facility]  I spent 60 minutes caring for this patient today, including preparing to see the patient, obtaining a medical history , reviewing a separately obtained history, performing a medically appropriate examination and/or evaluation, counseling and educating the patient/family/caregiver, ordering medications, tests, or procedures, documenting clinical information in the  electronic health record, and independently interpreting results (not separately reported/billed) and communicating results to the patient/family/caregiver  End of visit medications:  No orders of the defined types were placed in this encounter.    Current Outpatient Medications:    amLODipine  (NORVASC ) 5 MG tablet, Take 1 tablet (5 mg total) by mouth daily., Disp: 90 tablet, Rfl: 4   atorvastatin  (LIPITOR) 40 MG tablet, Take 1 tablet (40 mg total) by mouth daily., Disp: 90 tablet, Rfl: 3   clobetasol (TEMOVATE) 0.05 % external solution, Apply topically as needed., Disp: , Rfl:    hydrochlorothiazide  (HYDRODIURIL ) 25 MG tablet, Take 0.5 tablets (12.5 mg total) by mouth daily., Disp: 45 tablet, Rfl: 4   HYDROcodone -acetaminophen  (NORCO/VICODIN) 5-325 MG tablet, Take 1 tablet by mouth every 6 (six) hours as needed., Disp: , Rfl:    hydroxychloroquine (PLAQUENIL) 200 MG tablet, Take 200 mg by mouth daily., Disp: , Rfl:    lisinopril  (ZESTRIL ) 20 MG tablet, Take 1 tablet (20 mg total) by mouth daily., Disp: 90 tablet, Rfl: 4   Multiple Vitamin (MULTIVITAMIN) tablet, Take 1 tablet by mouth daily., Disp: , Rfl:    omeprazole  (PRILOSEC) 20 MG capsule, Take 20 mg by mouth daily., Disp: , Rfl:    Polyethyl Glycol-Propyl Glycol (SYSTANE) 0.4-0.3 % SOLN, Apply to eye., Disp: , Rfl:    rivaroxaban  (XARELTO ) 20 MG TABS tablet, Take 1 tablet (20 mg total) by mouth daily with supper., Disp: 90 tablet, Rfl: 4   sildenafil  (VIAGRA ) 100 MG tablet, Take 0.5-1 tablets (50-100 mg total) by mouth daily as needed for erectile dysfunction., Disp: 5 tablet, Rfl: 11   Subjective:   PATIENT ID: Dwayne Jacobson GENDER: male DOB: 12-Jan-1956, MRN: [id number]  Chief  Complaint  Patient presents with   Consult    No SOB. Occasional wheezing. Cough.     HPI  Discussed the use of AI scribe software for clinical note transcription with the patient, who gave verbal consent to proceed.  Dwayne Jacobson is a 68 year old  male with pulmonary hypertension and lupus who presents for evaluation of pulmonary hypertension given enlarged PA on LDCT for lung cancer screening.  He is here for evaluation of pulmonary hypertension after a CT scan in June 2025 showed an enlarged pulmonary trunk. An echocardiogram ordered by his PCP and performed on November 23, 2023, showed normal RV and LV function with grade one diastolic dysfunction, but pulmonary artery pressures were not measured. No symptoms of shortness of breath, even with activity, and no leg swelling. He has infrequent coughing, though his wife perceives it as frequent.  He has a history of lupus, diagnosed by Dr. Kernodle, and has been on hydroxychloroquine 200 mg daily for 8 years, which he tolerates well. Occasional facial sores but no other lupus-related issues such as kidney or eye problems. Previous blood work showed positive anti-double stranded DNA, anti-Rho, and ANA. He is followed closely by rheumatology, and last seen there in July of 2025.  He has a significant smoking history, having smoked for 40 years at an average of 1.5 packs per day before quitting in 2015. He switched to vaping tobacco-flavored juice with nicotine and vapes frequently, almost continuously. He is enrolled in a lung cancer screening program, and his last CT scan showed emphysema.  He worked in holiday representative, medical sales representative work, and likely had asbestos exposure early in his career.      Ancillary information including prior medications, full medical/surgical/family/social histories, and PFTs (when available) are listed below and have been reviewed.    Review of Systems  Constitutional:  Negative for chills, fever, malaise/fatigue and weight loss.  Respiratory:  Positive for cough (infrequent). Negative for hemoptysis, sputum production, shortness of breath and wheezing.   Cardiovascular:  Negative for chest pain.     Objective:   Vitals:   11/30/23 1401  BP: 120/62   Pulse: 63  Temp: 98.5 F (36.9 C)  SpO2: 99%  Weight: 203 lb 12.8 oz (92.4 kg)  Height: 5' 11 (1.803 m)   99% on RA BMI Readings from Last 3 Encounters:  11/30/23 28.42 kg/m  10/17/23 27.89 kg/m  10/07/23 27.77 kg/m   Wt Readings from Last 3 Encounters:  11/30/23 203 lb 12.8 oz (92.4 kg)  10/17/23 200 lb (90.7 kg)  10/07/23 199 lb (90.3 kg)    Physical Exam Constitutional:      Appearance: Normal appearance.  Cardiovascular:     Rate and Rhythm: Normal rate and regular rhythm.     Pulses: Normal pulses.     Heart sounds: Normal heart sounds.  Pulmonary:     Effort: Pulmonary effort is normal.     Breath sounds: Normal breath sounds.  Neurological:     General: No focal deficit present.     Mental Status: He is alert and oriented to person, place, and time. Mental status is at baseline.       Ancillary Information    Past Medical History:  Diagnosis Date   Arthritis    Clotting disorder    DVT (deep venous thrombosis) (HCC)    GERD (gastroesophageal reflux disease)    Hyperlipidemia    Hypertension    Seizures (HCC)    Skin cancer  Systemic lupus erythematosus (HCC)      Family History  Problem Relation Age of Onset   Diabetes Mother    Hypertension Mother    Hyperlipidemia Mother    Stroke Mother    Lung disease Mother    Heart disease Mother    Heart disease Father    Lung disease Father    Diabetes Sister    Colon polyps Maternal Grandmother      Past Surgical History:  Procedure Laterality Date   EYE SURGERY     NO PAST SURGERIES      Social History   Socioeconomic History   Marital status: Married    Spouse name: Not on file   Number of children: Not on file   Years of education: Not on file   Highest education level: Associate degree: occupational, scientist, product/process development, or vocational program  Occupational History   Not on file  Tobacco Use   Smoking status: Former    Current packs/day: 0.00    Average packs/day: 1 pack/day for  43.0 years (43.0 ttl pk-yrs)    Types: Cigarettes    Start date: 08/31/1970    Quit date: 08/30/2013    Years since quitting: 10.2    Passive exposure: Never   Smokeless tobacco: Former    Types: Engineer, Drilling   Vaping status: Every Day   Substances: Nicotine  Substance and Sexual Activity   Alcohol use: Yes    Alcohol/week: 8.0 - 10.0 standard drinks of alcohol    Types: 8 - 10 Cans of beer per week   Drug use: Never   Sexual activity: Yes  Other Topics Concern   Not on file  Social History Narrative   Not on file   Social Drivers of Health   Financial Resource Strain: Low Risk  (10/04/2023)   Overall Financial Resource Strain (CARDIA)    Difficulty of Paying Living Expenses: Not very hard  Food Insecurity: No Food Insecurity (10/04/2023)   Hunger Vital Sign    Worried About Running Out of Food in the Last Year: Never true    Ran Out of Food in the Last Year: Never true  Transportation Needs: No Transportation Needs (10/04/2023)   PRAPARE - Administrator, Civil Service (Medical): No    Lack of Transportation (Non-Medical): No  Physical Activity: Insufficiently Active (10/04/2023)   Exercise Vital Sign    Days of Exercise per Week: 4 days    Minutes of Exercise per Session: 30 min  Stress: No Stress Concern Present (10/04/2023)   [employer] of Occupational Health - Occupational Stress Questionnaire    Feeling of Stress: Only a little  Social Connections: Moderately Integrated (10/04/2023)   Social Connection and Isolation Panel    Frequency of Communication with Friends and Family: More than three times a week    Frequency of Social Gatherings with Friends and Family: Once a week    Attends Religious Services: 1 to 4 times per year    Active Member of [employer] or Organizations: No    Attends Banker Meetings: Not on file    Marital Status: Married  Catering Manager Violence: Not At Risk (10/07/2023)   Humiliation, Afraid, Rape, and Kick questionnaire     Fear of Current or Ex-Partner: No    Emotionally Abused: No    Physically Abused: No    Sexually Abused: No     No Known Allergies   CBC    Component Value Date/Time  WBC 5.4 10/07/2023 1340   WBC 6.1 03/25/2023 1204   RBC 4.42 10/07/2023 1340   RBC 4.37 03/25/2023 1204   HGB 13.6 10/07/2023 1340   HCT 41.3 10/07/2023 1340   PLT 311 10/07/2023 1340   MCV 93 10/07/2023 1340   MCH 30.8 10/07/2023 1340   MCH 31.4 03/25/2023 1204   MCHC 32.9 10/07/2023 1340   MCHC 33.6 03/25/2023 1204   RDW 13.1 10/07/2023 1340   LYMPHSABS 0.9 10/07/2023 1340   EOSABS 0.1 10/07/2023 1340   BASOSABS 0.1 10/07/2023 1340    Pulmonary Functions Testing Results:     No data to display          Outpatient Medications Prior to Visit  Medication Sig Dispense Refill   amLODipine  (NORVASC ) 5 MG tablet Take 1 tablet (5 mg total) by mouth daily. 90 tablet 4   atorvastatin  (LIPITOR) 40 MG tablet Take 1 tablet (40 mg total) by mouth daily. 90 tablet 3   clobetasol (TEMOVATE) 0.05 % external solution Apply topically as needed.     hydrochlorothiazide  (HYDRODIURIL ) 25 MG tablet Take 0.5 tablets (12.5 mg total) by mouth daily. 45 tablet 4   HYDROcodone -acetaminophen  (NORCO/VICODIN) 5-325 MG tablet Take 1 tablet by mouth every 6 (six) hours as needed.     hydroxychloroquine (PLAQUENIL) 200 MG tablet Take 200 mg by mouth daily.     lisinopril  (ZESTRIL ) 20 MG tablet Take 1 tablet (20 mg total) by mouth daily. 90 tablet 4   Multiple Vitamin (MULTIVITAMIN) tablet Take 1 tablet by mouth daily.     omeprazole  (PRILOSEC) 20 MG capsule Take 20 mg by mouth daily.     Polyethyl Glycol-Propyl Glycol (SYSTANE) 0.4-0.3 % SOLN Apply to eye.     rivaroxaban  (XARELTO ) 20 MG TABS tablet Take 1 tablet (20 mg total) by mouth daily with supper. 90 tablet 4   sildenafil  (VIAGRA ) 100 MG tablet Take 0.5-1 tablets (50-100 mg total) by mouth daily as needed for erectile dysfunction. 5 tablet 11   No facility-administered  medications prior to visit.

## 2024-01-04 ENCOUNTER — Ambulatory Visit: Admitting: Student in an Organized Health Care Education/Training Program

## 2024-01-04 ENCOUNTER — Encounter

## 2024-02-10 ENCOUNTER — Encounter: Payer: Self-pay | Admitting: Nurse Practitioner

## 2024-02-10 NOTE — Progress Notes (Unsigned)
 "  Office Visit Note  Patient: Dwayne Jacobson             Date of Birth: September 29, 1955           MRN: [id number]             PCP: Valerio Melanie DASEN, NP Referring: Valerio Melanie DASEN, NP Visit Date: 02/24/2024 Occupation: SUPERINTENDANT  Subjective:  No chief complaint on file.   History of Present Illness: Dwayne Jacobson is a 69 y.o. male ***     Activities of Daily Living:  Patient reports morning stiffness for *** {minute/hour:19697}.   Patient {ACTIONS;DENIES/REPORTS:21021675::Denies} nocturnal pain.  Difficulty dressing/grooming: {ACTIONS;DENIES/REPORTS:21021675::Denies} Difficulty climbing stairs: {ACTIONS;DENIES/REPORTS:21021675::Denies} Difficulty getting out of chair: {ACTIONS;DENIES/REPORTS:21021675::Denies} Difficulty using hands for taps, buttons, cutlery, and/or writing: {ACTIONS;DENIES/REPORTS:21021675::Denies}  No Rheumatology ROS completed.   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   Current every day nicotine vaping 10/31/2020   Healthcare maintenance 09/19/2020   Gastroesophageal reflux disease without esophagitis 08/17/2019   Chondrocalcinosis 01/13/2017   Primary osteoarthritis of both knees 01/13/2017   History of DVT (deep vein thrombosis)    Hypertension    Hyperlipidemia    SLE (systemic lupus erythematosus) (HCC) 01/18/2014    Past Medical History:  Diagnosis Date   Arthritis    Clotting disorder    DVT (deep venous thrombosis) (HCC)    GERD (gastroesophageal reflux disease)    Hyperlipidemia    Hypertension    Seizures (HCC)    Skin cancer    Systemic lupus erythematosus (HCC)     Family  History  Problem Relation Age of Onset   Diabetes Mother    Hypertension Mother    Hyperlipidemia Mother    Stroke Mother    Lung disease Mother    Heart disease Mother    Heart disease Father    Lung disease Father    Diabetes Sister    Colon polyps Maternal Grandmother    Past Surgical History:  Procedure Laterality Date   EYE SURGERY     NO PAST SURGERIES     Social History[1] Social History   Social History Narrative   Not on file     Immunization History  Administered Date(s) Administered   Fluad Quad(high Dose 65+) 12/05/2020   INFLUENZA, HIGH DOSE SEASONAL PF 10/07/2023   Influenza,inj,Quad PF,6+ Mos 01/03/2015, 11/05/2016, 11/07/2018   PNEUMOCOCCAL CONJUGATE-20 09/25/2021   Pneumococcal Polysaccharide-23 09/29/2012   Td 03/25/2006, 08/06/2016     Objective: Vital Signs: There were no vitals taken for this visit.   Physical Exam   Musculoskeletal Exam: ***  CDAI Exam: CDAI Score: -- Patient Global: --; Provider Global: -- Swollen: --; Tender: -- Joint Exam 02/24/2024   No joint exam has been documented for this visit   There is currently no information documented on the homunculus. Go to the Rheumatology activity and complete the homunculus joint exam.  Investigation: No additional findings.  Imaging: No results found.  Recent Labs: Lab Results  Component Value Date   WBC 5.4 10/07/2023   HGB 13.6 10/07/2023   PLT 311 10/07/2023   NA 135 10/07/2023   K 4.1 10/07/2023   CL 95 (L) 10/07/2023  CO2 25 10/07/2023   GLUCOSE 78 10/07/2023   BUN 9 10/07/2023   CREATININE 0.93 10/07/2023   BILITOT 0.2 10/07/2023   ALKPHOS 77 10/07/2023   AST 29 10/07/2023   ALT 31 10/07/2023   PROT 7.3 10/07/2023   ALBUMIN 4.6 10/07/2023   CALCIUM  10.1 10/07/2023   GFRAA 103 03/21/2020    Speciality Comments: PLQ Eye Exam: 03/04/2023 WNL @ [facility] Follow up in 1 year   Called to request updated PLQ eye exam.  Procedures:  No procedures  performed Allergies: Patient has no known allergies.   Assessment / Plan:     Visit Diagnoses: No diagnosis found.  Orders: No orders of the defined types were placed in this encounter.  No orders of the defined types were placed in this encounter.   Face-to-face time spent with patient was *** minutes. Greater than 50% of time was spent in counseling and coordination of care.  Follow-Up Instructions: No follow-ups on file.   Daved JAYSON Gavel, CMA  Note - This record has been created using Animal nutritionist.  Chart creation errors have been sought, but may not always  have been located. Such creation errors do not reflect on  the standard of medical care.    [1]  Social History Tobacco Use   Smoking status: Former    Current packs/day: 0.00    Average packs/day: 1 pack/day for 43.0 years (43.0 ttl pk-yrs)    Types: Cigarettes    Start date: 08/31/1970    Quit date: 08/30/2013    Years since quitting: 10.4    Passive exposure: Never   Smokeless tobacco: Former    Types: Engineer, Drilling   Vaping status: Every Day   Substances: Nicotine  Substance Use Topics   Alcohol use: Yes    Alcohol/week: 8.0 - 10.0 standard drinks of alcohol    Types: 8 - 10 Cans of beer per week   Drug use: Never   "

## 2024-02-13 ENCOUNTER — Ambulatory Visit: Admitting: Student in an Organized Health Care Education/Training Program

## 2024-02-13 ENCOUNTER — Ambulatory Visit

## 2024-02-13 DIAGNOSIS — I288 Other diseases of pulmonary vessels: Secondary | ICD-10-CM

## 2024-02-13 LAB — PULMONARY FUNCTION TEST
DL/VA % pred: 104 %
DL/VA: 4.2 ml/min/mmHg/L
DLCO unc % pred: 91 %
DLCO unc: 24.58 ml/min/mmHg
FEF 25-75 Post: 2.47 L/s
FEF 25-75 Pre: 1.51 L/s
FEF2575-%Change-Post: 63 %
FEF2575-%Pred-Post: 92 %
FEF2575-%Pred-Pre: 56 %
FEV1-%Change-Post: 12 %
FEV1-%Pred-Post: 73 %
FEV1-%Pred-Pre: 64 %
FEV1-Post: 2.53 L
FEV1-Pre: 2.25 L
FEV1FVC-%Change-Post: 3 %
FEV1FVC-%Pred-Pre: 96 %
FEV6-%Change-Post: 8 %
FEV6-%Pred-Post: 76 %
FEV6-%Pred-Pre: 70 %
FEV6-Post: 3.39 L
FEV6-Pre: 3.11 L
FEV6FVC-%Change-Post: 0 %
FEV6FVC-%Pred-Post: 104 %
FEV6FVC-%Pred-Pre: 104 %
FVC-%Change-Post: 9 %
FVC-%Pred-Post: 73 %
FVC-%Pred-Pre: 67 %
FVC-Post: 3.42 L
FVC-Pre: 3.13 L
Post FEV1/FVC ratio: 74 %
Post FEV6/FVC ratio: 99 %
Pre FEV1/FVC ratio: 72 %
Pre FEV6/FVC Ratio: 99 %
RV % pred: 120 %
RV: 2.97 L
TLC % pred: 89 %
TLC: 6.43 L

## 2024-02-13 NOTE — Patient Instructions (Signed)
 Full PFT completed today ? ?

## 2024-02-13 NOTE — Progress Notes (Signed)
 Full PFT completed today ? ?

## 2024-02-20 ENCOUNTER — Encounter: Payer: Self-pay | Admitting: Student in an Organized Health Care Education/Training Program

## 2024-02-20 ENCOUNTER — Ambulatory Visit: Admitting: Student in an Organized Health Care Education/Training Program

## 2024-02-20 VITALS — BP 114/70 | HR 57 | Temp 98.4°F | Ht 71.0 in | Wt 202.8 lb

## 2024-02-20 DIAGNOSIS — F17290 Nicotine dependence, other tobacco product, uncomplicated: Secondary | ICD-10-CM | POA: Diagnosis not present

## 2024-02-20 DIAGNOSIS — R931 Abnormal findings on diagnostic imaging of heart and coronary circulation: Secondary | ICD-10-CM | POA: Diagnosis not present

## 2024-02-20 DIAGNOSIS — R942 Abnormal results of pulmonary function studies: Secondary | ICD-10-CM

## 2024-02-20 DIAGNOSIS — J449 Chronic obstructive pulmonary disease, unspecified: Secondary | ICD-10-CM | POA: Diagnosis not present

## 2024-02-20 DIAGNOSIS — Z720 Tobacco use: Secondary | ICD-10-CM

## 2024-02-20 DIAGNOSIS — J439 Emphysema, unspecified: Secondary | ICD-10-CM

## 2024-02-20 DIAGNOSIS — I288 Other diseases of pulmonary vessels: Secondary | ICD-10-CM

## 2024-02-20 NOTE — Patient Instructions (Addendum)
" °  VISIT SUMMARY: During your follow-up visit, we reviewed the results of your recent pulmonary function tests and discussed your enlarged pulmonary artery, which was noted on a previous CT scan. We also addressed your vaping habits and their impact on your lung health.  YOUR PLAN: -ENLARGED PULMONARY ARTERY (ANATOMIC VARIANT): Your pulmonary function tests show a mild obstructive defect but normal lung volumes and gas exchange, which rules out pulmonary hypertension. The enlarged pulmonary artery is considered an anatomic variant, meaning it is a normal variation in your anatomy and not a cause for concern. No further testing for pulmonary hypertension is needed.  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 This means that your airways are slightly narrowed, which can lead to breathing difficulties. Vaping can cause lung injury and inflammation, so it is important to stop vaping to prevent further damage. We discussed using nicotine pouches as a less harmful alternative.  -NICOTINE DEPENDENCE (VAPING): You are currently dependent on nicotine through vaping, which poses risks to your lung health. We recommend switching to nicotine pouches as a safer alternative and encourage you to stop vaping to avoid further lung damage.  INSTRUCTIONS: Please follow up with us  if you experience any new or worsening symptoms. Consider using nicotine pouches as an alternative to vaping and make efforts to quit vaping entirely. No further testing for pulmonary hypertension is needed at this time.    Contains text generated by [employer]bridge.   "

## 2024-02-21 NOTE — Progress Notes (Signed)
 " Assessment & Plan  #Enlarged pulmonary artery  Pulmonary function tests reveal a mild obstructive defect with normal lung volumes (89% predicted) and DLCO (91% predicted), excluding pulmonary hypertension. He exhibits no symptoms of pulmonary hypertension, such as shortness of breath. No further testing for pulmonary hypertension is recommended at this point.  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 This is consistent with PRISM , with increased risk for developing COPD in the future. Discussed that vape use is linked to lung injury, inflammation, and potential irreversible lung damage. He is advised to cease vaping to prevent further airway damage. Nicotine pouches or lozenges are discussed as a less harmful alternative. Patient has no respiratory symptoms at the moment and would not want to start any inhaler use.   #Nicotine dependence (vaping)    He continues vaping despite risks of lung damage and airway inflammation. Encouragement is given to stop vaping and consider nicotine alternatives. Previously with 40 pack years of smoking history, quit in 2015.   Return if symptoms worsen or fail to improve.  Belva November, MD [facility]  I spent 30 minutes caring for this patient today, including preparing to see the patient, obtaining a medical history , reviewing a separately obtained history, performing a medically appropriate examination and/or evaluation, counseling and educating the patient/family/caregiver, documenting clinical information in the electronic health record, and independently interpreting results (not separately reported/billed) and communicating results to the patient/family/caregiver  End of visit medications:  No orders of the defined types were placed in this  encounter.   Current Medications[1]   Subjective:   PATIENT ID: Dwayne Jacobson Molt GENDER: male DOB: [birth year], MRN: [id number]  Chief Complaint  Patient presents with   Follow-up    Hx enlarged pulmonary artery.  Patient states doing well.  No sx noted today.    HPI  Discussed the use of AI scribe software for clinical note transcription with the patient, who gave verbal consent to proceed.  History of Present Illness Dwayne Jacobson is a 69 year old male who presents for follow-up after an enlarged pulmonary artery was noted on a low dose CT for lung cancer screening.  Initial Visit 11/30/2023:  He is here for evaluation of pulmonary hypertension after a CT scan in June 2025 showed an enlarged pulmonary trunk. An echocardiogram ordered by his PCP and performed on November 23, 2023, showed normal RV and LV function with grade one diastolic dysfunction, but pulmonary artery pressures were not measured. No symptoms of shortness of breath, even with activity, and no leg swelling. He has infrequent coughing, though his wife perceives it as frequent.   He has a history of lupus, diagnosed by Dr. Kernodle, and has been on hydroxychloroquine 200 mg daily for 8 years, which he tolerates well. Occasional facial sores but no other lupus-related issues such as kidney or eye problems. Previous blood work showed positive anti-double stranded DNA, anti-Rho, and ANA. He is followed closely by rheumatology, and last seen there in July of 2025.  Return Visit 02/21/2024:  In October, a low dose CT scan for lung cancer screening revealed an enlarged pulmonary artery, prompting further evaluation. Pulmonary function tests were performed today. No breathing issues have been reported since the last visit.  He has a history of vaping, which he started to help him quit smoking. He has been experiencing coughing, particularly at night,  which led to the initial medical consultation. His wife accompanying him believes  the vaping is contributing to his symptoms. He is not currently using any inhalers and does not report significant breathing problems at present.   He has a significant smoking history, having smoked for 40 years at an average of 1.5 packs per day before quitting in 2015. He switched to vaping tobacco-flavored juice with nicotine and vapes frequently. He is enrolled in a lung cancer screening program, and his last CT scan showed emphysema. He worked in holiday representative, medical sales representative work, and likely had asbestos exposure early in his career.    Ancillary information including prior medications, full medical/surgical/family/social histories, and PFTs (when available) are listed below and have been reviewed.    Review of Systems  Constitutional:  Negative for chills, fever and weight loss.  Respiratory:  Negative for cough, hemoptysis, sputum production, shortness of breath and wheezing.   Cardiovascular:  Negative for chest pain.     Objective:   Vitals:   02/20/24 0939  BP: 114/70  Pulse: (!) 57  Temp: 98.4 F (36.9 C)  TempSrc: Oral  SpO2: 97%  Weight: 202 lb 12.8 oz (92 kg)  Height: 5' 11 (1.803 m)   97% on RA BMI Readings from Last 3 Encounters:  02/20/24 28.28 kg/m  02/13/24 28.23 kg/m  11/30/23 28.42 kg/m   Wt Readings from Last 3 Encounters:  02/20/24 202 lb 12.8 oz (92 kg)  02/13/24 202 lb 6.4 oz (91.8 kg)  11/30/23 203 lb 12.8 oz (92.4 kg)     Physical Exam Constitutional:      Appearance: Normal appearance.  Cardiovascular:     Rate and Rhythm: Normal rate and regular rhythm.     Pulses: Normal pulses.     Heart sounds: Normal heart sounds.  Pulmonary:     Effort: Pulmonary effort is normal.     Breath sounds: Normal breath sounds.  Neurological:     General: No focal deficit present.     Mental Status: He is alert and oriented to person, place, and time. Mental status is at baseline.       Ancillary Information    Past Medical History:   Diagnosis Date   Arthritis    Clotting disorder    DVT (deep venous thrombosis) (HCC)    GERD (gastroesophageal reflux disease)    Hyperlipidemia    Hypertension    Seizures (HCC)    Skin cancer    Systemic lupus erythematosus (HCC)      Family History  Problem Relation Age of Onset   Diabetes Mother    Hypertension Mother    Hyperlipidemia Mother    Stroke Mother    Lung disease Mother    Heart disease Mother    Heart disease Father    Lung disease Father    Diabetes Sister    Colon polyps Maternal Grandmother      Past Surgical History:  Procedure Laterality Date   EYE SURGERY     NO PAST SURGERIES      Social History   Socioeconomic History   Marital status: Married    Spouse name: Not on file   Number of children: Not on file   Years of education: Not on file   Highest education level: Associate degree: occupational, scientist, product/process development, or vocational program  Occupational History   Not on file  Tobacco Use   Smoking status: Former    Current packs/day: 0.00    Average packs/day: 1 pack/day for  43.0 years (43.0 ttl pk-yrs)    Types: Cigarettes    Start date: 08/31/1970    Quit date: 08/30/2013    Years since quitting: 10.4    Passive exposure: Never   Smokeless tobacco: Former    Types: Engineer, Drilling   Vaping status: Every Day   Substances: Nicotine  Substance and Sexual Activity   Alcohol use: Yes    Alcohol/week: 8.0 - 10.0 standard drinks of alcohol    Types: 8 - 10 Cans of beer per week   Drug use: Never   Sexual activity: Yes  Other Topics Concern   Not on file  Social History Narrative   Not on file   Social Drivers of Health   Tobacco Use: Medium Risk (02/20/2024)   Patient History    Smoking Tobacco Use: Former    Smokeless Tobacco Use: Former    Passive Exposure: Never  Programmer, Applications: Low Risk (10/04/2023)   Overall Financial Resource Strain (CARDIA)    Difficulty of Paying Living Expenses: Not very hard  Food Insecurity: No  Food Insecurity (10/04/2023)   Epic    Worried About Programme Researcher, Broadcasting/film/video in the Last Year: Never true    Ran Out of Food in the Last Year: Never true  Transportation Needs: No Transportation Needs (10/04/2023)   Epic    Lack of Transportation (Medical): No    Lack of Transportation (Non-Medical): No  Physical Activity: Insufficiently Active (10/04/2023)   Exercise Vital Sign    Days of Exercise per Week: 4 days    Minutes of Exercise per Session: 30 min  Stress: No Stress Concern Present (10/04/2023)   [employer] of Occupational Health - Occupational Stress Questionnaire    Feeling of Stress: Only a little  Social Connections: Moderately Integrated (10/04/2023)   Social Connection and Isolation Panel    Frequency of Communication with Friends and Family: More than three times a week    Frequency of Social Gatherings with Friends and Family: Once a week    Attends Religious Services: 1 to 4 times per year    Active Member of [employer] or Organizations: No    Attends Banker Meetings: Not on file    Marital Status: Married  Intimate Partner Violence: Not At Risk (10/07/2023)   Epic    Fear of Current or Ex-Partner: No    Emotionally Abused: No    Physically Abused: No    Sexually Abused: No  Depression (PHQ2-9): Low Risk (10/07/2023)   Depression (PHQ2-9)    PHQ-2 Score: 1  Alcohol Screen: Low Risk (10/04/2023)   Alcohol Screen    Last Alcohol Screening Score (AUDIT): 5  Housing: Unknown (10/04/2023)   Epic    Unable to Pay for Housing in the Last Year: No    Number of Times Moved in the Last Year: Not on file    Homeless in the Last Year: No  Utilities: Not At Risk (02/25/2023)   Received from [facility] Utilities    Threatened with loss of utilities: No  Health Literacy: Adequate Health Literacy (10/07/2023)   B1300 Health Literacy    Frequency of need for help with medical instructions: Never     Allergies[2]   CBC    Component Value Date/Time    WBC 5.4 10/07/2023 1340   WBC 6.1 03/25/2023 1204   RBC 4.42 10/07/2023 1340   RBC 4.37 03/25/2023 1204   HGB 13.6 10/07/2023 1340  HCT 41.3 10/07/2023 1340   PLT 311 10/07/2023 1340   MCV 93 10/07/2023 1340   MCH 30.8 10/07/2023 1340   MCH 31.4 03/25/2023 1204   MCHC 32.9 10/07/2023 1340   MCHC 33.6 03/25/2023 1204   RDW 13.1 10/07/2023 1340   LYMPHSABS 0.9 10/07/2023 1340   EOSABS 0.1 10/07/2023 1340   BASOSABS 0.1 10/07/2023 1340    Pulmonary Functions Testing Results:    Latest Ref Rng & Units 02/13/2024    9:16 AM  PFT Results  FVC-Pre L 3.13   FVC-Predicted Pre % 67   FVC-Post L 3.42   FVC-Predicted Post % 73   Pre FEV1/FVC % % 72   Post FEV1/FCV % % 74   FEV1-Pre L 2.25   FEV1-Predicted Pre % 64   FEV1-Post L 2.53   DLCO uncorrected ml/min/mmHg 24.58   DLCO UNC% % 91   DLVA Predicted % 104   TLC L 6.43   TLC % Predicted % 89   RV % Predicted % 120     Outpatient Medications Prior to Visit  Medication Sig Dispense Refill   amLODipine  (NORVASC ) 5 MG tablet Take 1 tablet (5 mg total) by mouth daily. 90 tablet 4   atorvastatin  (LIPITOR) 40 MG tablet Take 1 tablet (40 mg total) by mouth daily. 90 tablet 3   clobetasol (TEMOVATE) 0.05 % external solution Apply topically as needed.     hydrochlorothiazide  (HYDRODIURIL ) 25 MG tablet Take 0.5 tablets (12.5 mg total) by mouth daily. 45 tablet 4   HYDROcodone -acetaminophen  (NORCO/VICODIN) 5-325 MG tablet Take 1 tablet by mouth every 6 (six) hours as needed.     hydroxychloroquine (PLAQUENIL) 200 MG tablet Take 200 mg by mouth daily.     lisinopril  (ZESTRIL ) 20 MG tablet Take 1 tablet (20 mg total) by mouth daily. 90 tablet 4   Multiple Vitamin (MULTIVITAMIN) tablet Take 1 tablet by mouth daily.     omeprazole  (PRILOSEC) 20 MG capsule Take 20 mg by mouth daily.     Polyethyl Glycol-Propyl Glycol (SYSTANE) 0.4-0.3 % SOLN Apply to eye.     rivaroxaban  (XARELTO ) 20 MG TABS tablet Take 1 tablet (20 mg total) by mouth  daily with supper. 90 tablet 4   sildenafil  (VIAGRA ) 100 MG tablet Take 0.5-1 tablets (50-100 mg total) by mouth daily as needed for erectile dysfunction. 5 tablet 11   No facility-administered medications prior to visit.      [1]  Current Outpatient Medications:    amLODipine  (NORVASC ) 5 MG tablet, Take 1 tablet (5 mg total) by mouth daily., Disp: 90 tablet, Rfl: 4   atorvastatin  (LIPITOR) 40 MG tablet, Take 1 tablet (40 mg total) by mouth daily., Disp: 90 tablet, Rfl: 3   clobetasol (TEMOVATE) 0.05 % external solution, Apply topically as needed., Disp: , Rfl:    hydrochlorothiazide  (HYDRODIURIL ) 25 MG tablet, Take 0.5 tablets (12.5 mg total) by mouth daily., Disp: 45 tablet, Rfl: 4   HYDROcodone -acetaminophen  (NORCO/VICODIN) 5-325 MG tablet, Take 1 tablet by mouth every 6 (six) hours as needed., Disp: , Rfl:    hydroxychloroquine (PLAQUENIL) 200 MG tablet, Take 200 mg by mouth daily., Disp: , Rfl:    lisinopril  (ZESTRIL ) 20 MG tablet, Take 1 tablet (20 mg total) by mouth daily., Disp: 90 tablet, Rfl: 4   Multiple Vitamin (MULTIVITAMIN) tablet, Take 1 tablet by mouth daily., Disp: , Rfl:    omeprazole  (PRILOSEC) 20 MG capsule, Take 20 mg by mouth daily., Disp: , Rfl:    Polyethyl Glycol-Propyl  Glycol (SYSTANE) 0.4-0.3 % SOLN, Apply to eye., Disp: , Rfl:    rivaroxaban  (XARELTO ) 20 MG TABS tablet, Take 1 tablet (20 mg total) by mouth daily with supper., Disp: 90 tablet, Rfl: 4   sildenafil  (VIAGRA ) 100 MG tablet, Take 0.5-1 tablets (50-100 mg total) by mouth daily as needed for erectile dysfunction., Disp: 5 tablet, Rfl: 11 [2] No Known Allergies  "

## 2024-02-24 ENCOUNTER — Ambulatory Visit: Admitting: Rheumatology

## 2024-02-24 DIAGNOSIS — M5030 Other cervical disc degeneration, unspecified cervical region: Secondary | ICD-10-CM

## 2024-02-24 DIAGNOSIS — M3219 Other organ or system involvement in systemic lupus erythematosus: Secondary | ICD-10-CM

## 2024-02-24 DIAGNOSIS — M170 Bilateral primary osteoarthritis of knee: Secondary | ICD-10-CM

## 2024-02-24 DIAGNOSIS — E782 Mixed hyperlipidemia: Secondary | ICD-10-CM

## 2024-02-24 DIAGNOSIS — M1120 Other chondrocalcinosis, unspecified site: Secondary | ICD-10-CM

## 2024-02-24 DIAGNOSIS — M62838 Other muscle spasm: Secondary | ICD-10-CM

## 2024-02-24 DIAGNOSIS — I10 Essential (primary) hypertension: Secondary | ICD-10-CM

## 2024-02-24 DIAGNOSIS — Z86718 Personal history of other venous thrombosis and embolism: Secondary | ICD-10-CM

## 2024-02-24 DIAGNOSIS — Z79899 Other long term (current) drug therapy: Secondary | ICD-10-CM

## 2024-03-02 ENCOUNTER — Ambulatory Visit: Admitting: Rheumatology

## 2024-04-05 ENCOUNTER — Ambulatory Visit: Admitting: Nurse Practitioner

## 2024-04-06 ENCOUNTER — Ambulatory Visit: Admitting: Nurse Practitioner

## 2024-04-06 ENCOUNTER — Ambulatory Visit: Admitting: Physician Assistant

## 2024-10-17 ENCOUNTER — Ambulatory Visit: Admitting: Neurosurgery
